# Patient Record
Sex: Female | Born: 1935 | Race: White | Hispanic: No | Smoking: Former smoker
Health system: Southern US, Community
[De-identification: ages and names within clinical notes are randomized; demographics above are authoritative.]

## PROBLEM LIST (undated history)

## (undated) DIAGNOSIS — K56609 Unspecified intestinal obstruction, unspecified as to partial versus complete obstruction: Secondary | ICD-10-CM

## (undated) DIAGNOSIS — I471 Supraventricular tachycardia, unspecified: Secondary | ICD-10-CM

## (undated) DIAGNOSIS — J189 Pneumonia, unspecified organism: Secondary | ICD-10-CM

## (undated) DIAGNOSIS — J40 Bronchitis, not specified as acute or chronic: Secondary | ICD-10-CM

## (undated) DIAGNOSIS — G43909 Migraine, unspecified, not intractable, without status migrainosus: Secondary | ICD-10-CM

## (undated) DIAGNOSIS — C50919 Malignant neoplasm of unspecified site of unspecified female breast: Secondary | ICD-10-CM

## (undated) DIAGNOSIS — I509 Heart failure, unspecified: Secondary | ICD-10-CM

## (undated) DIAGNOSIS — I2510 Atherosclerotic heart disease of native coronary artery without angina pectoris: Secondary | ICD-10-CM

## (undated) DIAGNOSIS — I480 Paroxysmal atrial fibrillation: Secondary | ICD-10-CM

## (undated) DIAGNOSIS — I428 Other cardiomyopathies: Secondary | ICD-10-CM

## (undated) DIAGNOSIS — I5032 Chronic diastolic (congestive) heart failure: Secondary | ICD-10-CM

## (undated) DIAGNOSIS — C9110 Chronic lymphocytic leukemia of B-cell type not having achieved remission: Principal | ICD-10-CM

## (undated) HISTORY — DX: Other cardiomyopathies: I42.8

## (undated) HISTORY — PX: ABDOMINAL HYSTERECTOMY: SHX81

## (undated) HISTORY — PX: APPENDECTOMY: SHX54

## (undated) HISTORY — DX: Paroxysmal atrial fibrillation: I48.0

## (undated) HISTORY — DX: Atherosclerotic heart disease of native coronary artery without angina pectoris: I25.10

## (undated) HISTORY — DX: Bronchitis, not specified as acute or chronic: J40

## (undated) HISTORY — DX: Supraventricular tachycardia, unspecified: I47.10

## (undated) HISTORY — PX: MASTECTOMY: SHX3

## (undated) HISTORY — PX: TOTAL HIP ARTHROPLASTY: SHX124

## (undated) HISTORY — DX: Chronic lymphocytic leukemia of B-cell type not having achieved remission: C91.10

## (undated) HISTORY — PX: CHOLECYSTECTOMY: SHX55

## (undated) HISTORY — DX: Malignant neoplasm of unspecified site of unspecified female breast: C50.919

## (undated) HISTORY — DX: Chronic diastolic (congestive) heart failure: I50.32

## (undated) NOTE — *Deleted (*Deleted)
Primary Care Physician: Gareth Morgan, MD Primary Cardiologist: Dr Tenny Craw Primary Electrophysiologist: none Referring Physician: Wynelle Cleveland is a 89 y.o. female with a history of mild AS, OSA, systolic CHF (LVEF 40 to 45% in 03/2019), LBBB, CLL, COPD, and  who presents for consultation in the [facility].  Patient is on Eliquis for a CHADS2VASC score of 4. Patient had recently been having palpitations and presyncopal episodes. A 14 day Zio monitor showed 3% afib burden and also 43 episodes of SVT. She was started on diltiazem. Since then, she has not had further presyncopal episodes. However, she did have an episode of chest pain while laying on her left side as well as palpitations occurring around 3 am on 03/22/20. This resolved within a few minutes. She denies any bleeding issues on anticoagulation.   On follow up today, ***  Today, she denies symptoms of ***palpitations, chest pain, shortness of breath, orthopnea, PND, lower extremity edema, dizziness, presyncope, syncope, bleeding, or neurologic sequela. The patient is tolerating medications without difficulties and is otherwise without complaint today.    Atrial Fibrillation Risk Factors:  she does have symptoms or diagnosis of sleep apnea. she is not compliant with CPAP therapy. she does not have a history of rheumatic fever. she does not have a history of alcohol use. The patient does have a history of early familial atrial fibrillation or other arrhythmias. Brother has PPM.  she has a BMI of There is no height or weight on file to calculate BMI.. There were no vitals filed for this visit.  Family History  Problem Relation Age of Onset  . Cancer Brother   . Cancer Other      Atrial Fibrillation Management history:  Previous antiarrhythmic drugs: none Previous cardioversions: none Previous ablations: none CHADS2VASC score: 4 Anticoagulation history: Eliquis    Past  Medical History:  Diagnosis Date  . Bowel obstruction (HCC)   . Breast cancer (HCC)   . Breast cancer, stage 3 (HCC) 07/21/2011  . Bronchitis   . CLL (chronic lymphocytic leukemia) (HCC) 07/21/2011  . Migraines   . PAF (paroxysmal atrial fibrillation) (HCC)   . Pneumonia    Past Surgical History:  Procedure Laterality Date  . ABDOMINAL HYSTERECTOMY    . APPENDECTOMY    . CHOLECYSTECTOMY    . LAPAROTOMY N/A 06/08/2015   Procedure: EXPLORATORY LAPAROTOMY;  Surgeon: Franky Macho, MD;  Location: [facility];  Service: General;  Laterality: N/A;  . LYSIS OF ADHESION N/A 06/08/2015   Procedure: LYSIS OF ADHESION;  Surgeon: Franky Macho, MD;  Location: [facility];  Service: General;  Laterality: N/A;  . MASTECTOMY     right  . TOTAL HIP ARTHROPLASTY     right    Current Outpatient Medications  Medication Sig Dispense Refill  . acetaminophen (TYLENOL) 500 MG tablet Take 500 mg by mouth every 6 (six) hours as needed for mild pain, moderate pain or headache.    . albuterol (PROVENTIL HFA;VENTOLIN HFA) 108 (90 BASE) MCG/ACT inhaler Inhale 2 puffs into the lungs every 6 (six) hours as needed for shortness of breath.     Marland Kitchen albuterol (PROVENTIL) (2.5 MG/3ML) 0.083% nebulizer solution Take 3 mLs (2.5 mg total) by nebulization every 6 (six) hours as needed for wheezing or shortness of breath (when not relieved by inhaler.). 75 mL 12  . apixaban (ELIQUIS) 5 MG TABS tablet Take 1 tablet (5 mg total) by mouth 2 (two) times daily. 180 tablet  1  . bisoprolol (ZEBETA) 5 MG tablet Take 7.5mg  by mouth once daily    . Blood Glucose Monitoring Suppl (ONETOUCH VERIO FLEX SYSTEM) w/Device KIT     . cholecalciferol (VITAMIN D) 1000 UNITS tablet Take 1,000 Units by mouth daily.    Marland Kitchen diltiazem (CARDIZEM) 30 MG tablet Take 1 tablet (30 mg total) by mouth 2 (two) times daily. 60 tablet 2  . Lancets (ONETOUCH DELICA PLUS LANCET33G) MISC 2 (two) times daily.    Marland Kitchen omeprazole (PRILOSEC) 20 MG capsule Take 20 mg by mouth at  bedtime.     Letta Pate VERIO test strip 2 (two) times daily.    Marland Kitchen spironolactone (ALDACTONE) 25 MG tablet Take 0.5 tablets (12.5 mg total) by mouth daily. 45 tablet 1   No current facility-administered medications for this visit.    Allergies  Allergen Reactions  . Codeine Nausea And Vomiting    headache  . Meclizine Other (See Comments)    Made dizziness worse.  . Sulfa Antibiotics Nausea And Vomiting    Stomach upset    Social History   Socioeconomic History  . Marital status: Widowed    Spouse name: Not on file  . Number of children: Not on file  . Years of education: 55  . Highest education level: Not on file  Occupational History  . Not on file  Tobacco Use  . Smoking status: Former Smoker    Quit date: 10/26/1978    Years since quitting: 41.4  . Smokeless tobacco: Never Used  Vaping Use  . Vaping Use: Never used  Substance and Sexual Activity  . Alcohol use: No  . Drug use: No  . Sexual activity: Not Currently  Other Topics Concern  . Not on file  Social History Narrative   Pt lives by herself   Social Determinants of Health   Financial Resource Strain:   . Difficulty of Paying Living Expenses: Not on file  Food Insecurity:   . Worried About Programme researcher, broadcasting/film/video in the Last Year: Not on file  . Ran Out of Food in the Last Year: Not on file  Transportation Needs:   . Lack of Transportation (Medical): Not on file  . Lack of Transportation (Non-Medical): Not on file  Physical Activity:   . Days of Exercise per Week: Not on file  . Minutes of Exercise per Session: Not on file  Stress:   . Feeling of Stress : Not on file  Social Connections:   . Frequency of Communication with Friends and Family: Not on file  . Frequency of Social Gatherings with Friends and Family: Not on file  . Attends Religious Services: Not on file  . Active Member of Clubs or Organizations: Not on file  . Attends Banker Meetings: Not on file  . Marital Status: Not on  file  Intimate Partner Violence:   . Fear of Current or Ex-Partner: Not on file  . Emotionally Abused: Not on file  . Physically Abused: Not on file  . Sexually Abused: Not on file     ROS- All systems are reviewed and negative except as per the HPI above.  Physical Exam: There were no vitals filed for this visit.  GEN- The patient is well appearing, alert and oriented x 3 today.   HEENT-head normocephalic, atraumatic, sclera clear, conjunctiva pink, hearing intact, trachea midline. Lungs- Clear to ausculation bilaterally, normal work of breathing Heart- ***Regular rate and rhythm, no murmurs, rubs or gallops  GI-  soft, NT, ND, + BS Extremities- no clubbing, cyanosis, or edema MS- no significant deformity or atrophy Skin- no rash or lesion Psych- euthymic mood, full affect Neuro- strength and sensation are intact   Wt Readings from Last 3 Encounters:  03/23/20 81.6 kg  03/19/20 81 kg  02/11/20 81.6 kg    EKG today demonstrates ***  Echo 03/26/19 demonstrated  1. Left ventricular ejection fraction, by visual estimation, is 40 to  45%. The left ventricle has mild to moderately decreased function. There  is moderately increased left ventricular hypertrophy.  2. Abnormal septal motion consistent with left bundle branch block.  3. Left ventricular diastolic parameters are consistent with Grade II  diastolic dysfunction (pseudonormalization).  4. Global right ventricle has normal systolic function.The right  ventricular size is normal. No increase in right ventricular wall  thickness.  5. Left atrial size was severely dilated.  6. Right atrial size was normal.  7. Presence of pericardial fat pad.  8. Mild to moderate aortic valve annular calcification.  9. Moderate mitral annular calcification.  10. The mitral valve is grossly normal. Mild mitral valve regurgitation.  11. The tricuspid valve is grossly normal. Tricuspid valve regurgitation  is mild.  12. Aortic  valve mean gradient measures 11.0 mmHg.  13. The aortic valve is tricuspid. Aortic valve regurgitation is not  visualized. Mild aortic valve stenosis.  14. The pulmonic valve was grossly normal. Pulmonic valve regurgitation is  trivial.  15. Mildly elevated pulmonary artery systolic pressure.  16. The tricuspid regurgitant velocity is 3.04 m/s, and with an assumed  right atrial pressure of 3 mmHg, the estimated right ventricular systolic  pressure is mildly elevated at 40.0 mmHg.  17. The inferior vena cava is normal in size with greater than 50%  respiratory variability, suggesting right atrial pressure of 3 mmHg.   In comparison to the previous echocardiogram(s): Compared to the prior  study in February 2020 LVEF has decreased.  Epic records are reviewed at length today  CHA2DS2-VASc Score = 4  The patient's score is based upon: CHF History: 1 HTN History: 0 Diabetes History: 0 Stroke History: 0 Vascular Disease History: 0 Age Score: 2 Gender Score: 1      ASSESSMENT AND PLAN: 1. Paroxysmal Atrial Fibrillation/SVT The patient's CHA2DS2-VASc score is 4, indicating a 4.8% annual risk of stroke.   *** Continue bisoprolol 7.5 mg daily Continue Eliquis 5 mg BID Continue diltiazem 30 mg BID  2. Secondary Hypercoagulable State (ICD10:  D68.69) The patient is at significant risk for stroke/thromboembolism based upon her CHA2DS2-VASc Score of 4.  Continue Apixaban (Eliquis).   3. Obesity There is no height or weight on file to calculate BMI. Lifestyle modification was discussed and encouraged including regular physical activity and weight reduction. ***  4. Obstructive sleep apnea Patient is not compliant with CPAP therapy.  ***  5. Chronic systolic CHF No signs or symptoms of fluid overload.  ***   Follow up ***   Jorja Loa PA-C [facility] [address] [place], [place] [address] [phone] 04/07/2020 8:49 AM

---

## 1997-09-25 ENCOUNTER — Other Ambulatory Visit: Admission: RE | Admit: 1997-09-25 | Discharge: 1997-09-25 | Payer: Self-pay | Admitting: *Deleted

## 1997-09-30 ENCOUNTER — Ambulatory Visit (HOSPITAL_COMMUNITY): Admission: RE | Admit: 1997-09-30 | Discharge: 1997-09-30 | Payer: Self-pay | Admitting: *Deleted

## 1997-10-10 ENCOUNTER — Ambulatory Visit (HOSPITAL_COMMUNITY): Admission: RE | Admit: 1997-10-10 | Discharge: 1997-10-10 | Payer: Self-pay | Admitting: Oncology

## 1997-10-27 ENCOUNTER — Ambulatory Visit (HOSPITAL_COMMUNITY): Admission: RE | Admit: 1997-10-27 | Discharge: 1997-10-27 | Payer: Self-pay | Admitting: Oncology

## 1997-12-23 ENCOUNTER — Ambulatory Visit (HOSPITAL_COMMUNITY): Admission: RE | Admit: 1997-12-23 | Discharge: 1997-12-24 | Payer: Self-pay | Admitting: *Deleted

## 1998-03-03 ENCOUNTER — Encounter: Admission: RE | Admit: 1998-03-03 | Discharge: 1998-06-01 | Payer: Self-pay | Admitting: Radiation Oncology

## 2002-07-19 ENCOUNTER — Encounter: Payer: Self-pay | Admitting: Gastroenterology

## 2002-07-19 ENCOUNTER — Ambulatory Visit (HOSPITAL_COMMUNITY): Admission: RE | Admit: 2002-07-19 | Discharge: 2002-07-19 | Payer: Self-pay | Admitting: Gastroenterology

## 2002-07-22 ENCOUNTER — Emergency Department (HOSPITAL_COMMUNITY): Admission: EM | Admit: 2002-07-22 | Discharge: 2002-07-22 | Payer: Self-pay | Admitting: Emergency Medicine

## 2002-07-31 ENCOUNTER — Ambulatory Visit (HOSPITAL_COMMUNITY): Admission: RE | Admit: 2002-07-31 | Discharge: 2002-07-31 | Payer: Self-pay | Admitting: Gastroenterology

## 2002-07-31 ENCOUNTER — Encounter (INDEPENDENT_AMBULATORY_CARE_PROVIDER_SITE_OTHER): Payer: Self-pay | Admitting: Specialist

## 2002-12-17 ENCOUNTER — Other Ambulatory Visit: Admission: RE | Admit: 2002-12-17 | Discharge: 2002-12-17 | Payer: Self-pay | Admitting: Anesthesiology

## 2004-03-23 ENCOUNTER — Ambulatory Visit: Payer: Self-pay | Admitting: Oncology

## 2004-07-20 ENCOUNTER — Ambulatory Visit: Payer: Self-pay | Admitting: Oncology

## 2004-08-03 ENCOUNTER — Ambulatory Visit (HOSPITAL_COMMUNITY): Admission: RE | Admit: 2004-08-03 | Discharge: 2004-08-03 | Payer: Self-pay

## 2004-08-03 ENCOUNTER — Ambulatory Visit (HOSPITAL_COMMUNITY): Admission: RE | Admit: 2004-08-03 | Discharge: 2004-08-03 | Payer: Self-pay | Admitting: Oncology

## 2004-09-07 ENCOUNTER — Ambulatory Visit: Payer: Self-pay | Admitting: Oncology

## 2004-11-04 ENCOUNTER — Ambulatory Visit: Payer: Self-pay | Admitting: Oncology

## 2004-11-15 ENCOUNTER — Encounter (INDEPENDENT_AMBULATORY_CARE_PROVIDER_SITE_OTHER): Payer: Self-pay | Admitting: Internal Medicine

## 2004-12-30 ENCOUNTER — Ambulatory Visit: Payer: Self-pay | Admitting: Oncology

## 2005-02-17 ENCOUNTER — Ambulatory Visit: Payer: Self-pay | Admitting: Oncology

## 2005-05-27 ENCOUNTER — Ambulatory Visit: Payer: Self-pay | Admitting: Oncology

## 2005-08-30 ENCOUNTER — Ambulatory Visit: Payer: Self-pay | Admitting: Oncology

## 2005-08-31 LAB — CBC WITH DIFFERENTIAL/PLATELET
BASO%: 0.2 % (ref 0.0–2.0)
Eosinophils Absolute: 0.1 10*3/uL (ref 0.0–0.5)
HCT: 44.2 % (ref 34.8–46.6)
LYMPH%: 76.3 % — ABNORMAL HIGH (ref 14.0–48.0)
MONO#: 1.5 10*3/uL — ABNORMAL HIGH (ref 0.1–0.9)
NEUT#: 9.7 10*3/uL — ABNORMAL HIGH (ref 1.5–6.5)
Platelets: 293 10*3/uL (ref 145–400)
RBC: 4.91 10*6/uL (ref 3.70–5.32)
WBC: 47.7 10*3/uL — ABNORMAL HIGH (ref 3.9–10.0)
lymph#: 36.4 10*3/uL — ABNORMAL HIGH (ref 0.9–3.3)

## 2005-08-31 LAB — MORPHOLOGY
PLT EST: ADEQUATE
RBC Comments: NORMAL

## 2005-08-31 LAB — COMPREHENSIVE METABOLIC PANEL
ALT: 13 U/L (ref 0–40)
AST: 17 U/L (ref 0–37)
Alkaline Phosphatase: 53 U/L (ref 39–117)
CO2: 22 mEq/L (ref 19–32)
Creatinine, Ser: 1.1 mg/dL (ref 0.4–1.2)
Sodium: 139 mEq/L (ref 135–145)
Total Bilirubin: 0.6 mg/dL (ref 0.3–1.2)
Total Protein: 7 g/dL (ref 6.0–8.3)

## 2005-11-14 ENCOUNTER — Ambulatory Visit: Payer: Self-pay | Admitting: Oncology

## 2005-11-14 LAB — COMPREHENSIVE METABOLIC PANEL
ALT: 16 U/L (ref 0–40)
BUN: 17 mg/dL (ref 6–23)
CO2: 17 mEq/L — ABNORMAL LOW (ref 19–32)
Calcium: 9.2 mg/dL (ref 8.4–10.5)
Creatinine, Ser: 0.97 mg/dL (ref 0.40–1.20)
Total Bilirubin: 0.5 mg/dL (ref 0.3–1.2)

## 2005-11-14 LAB — MORPHOLOGY
PLT EST: ADEQUATE
RBC Comments: NORMAL

## 2005-11-14 LAB — CBC WITH DIFFERENTIAL/PLATELET
BASO%: 0.4 % (ref 0.0–2.0)
Eosinophils Absolute: 0.2 10*3/uL (ref 0.0–0.5)
MCHC: 34 g/dL (ref 32.0–36.0)
MONO#: 1.1 10*3/uL — ABNORMAL HIGH (ref 0.1–0.9)
NEUT#: 5.1 10*3/uL (ref 1.5–6.5)
RBC: 4.89 10*6/uL (ref 3.70–5.32)
RDW: 14 % (ref 11.3–14.5)
WBC: 32 10*3/uL — ABNORMAL HIGH (ref 3.9–10.0)

## 2005-11-14 LAB — LACTATE DEHYDROGENASE: LDH: 163 U/L (ref 94–250)

## 2006-01-04 ENCOUNTER — Ambulatory Visit: Payer: Self-pay | Admitting: Oncology

## 2006-01-09 LAB — MORPHOLOGY: PLT EST: ADEQUATE

## 2006-01-09 LAB — CBC WITH DIFFERENTIAL/PLATELET
Basophils Absolute: 0.1 10*3/uL (ref 0.0–0.1)
Eosinophils Absolute: 0.3 10*3/uL (ref 0.0–0.5)
HGB: 13.8 g/dL (ref 11.6–15.9)
MCV: 89.1 fL (ref 81.0–101.0)
MONO#: 1 10*3/uL — ABNORMAL HIGH (ref 0.1–0.9)
NEUT#: 4.1 10*3/uL (ref 1.5–6.5)
RDW: 13.9 % (ref 11.3–14.5)
WBC: 29.9 10*3/uL — ABNORMAL HIGH (ref 3.9–10.0)
lymph#: 24.5 10*3/uL — ABNORMAL HIGH (ref 0.9–3.3)

## 2006-03-02 ENCOUNTER — Ambulatory Visit: Payer: Self-pay | Admitting: Oncology

## 2006-03-06 LAB — MORPHOLOGY: PLT EST: ADEQUATE

## 2006-03-06 LAB — CBC WITH DIFFERENTIAL/PLATELET
BASO%: 0.3 % (ref 0.0–2.0)
EOS%: 1.3 % (ref 0.0–7.0)
MCH: 29.4 pg (ref 26.0–34.0)
MCHC: 33.6 g/dL (ref 32.0–36.0)
RDW: 13.9 % (ref 11.3–14.5)
lymph#: 25.7 10*3/uL — ABNORMAL HIGH (ref 0.9–3.3)

## 2006-03-06 LAB — COMPREHENSIVE METABOLIC PANEL
AST: 16 U/L (ref 0–37)
Alkaline Phosphatase: 65 U/L (ref 39–117)
BUN: 17 mg/dL (ref 6–23)
Calcium: 8.9 mg/dL (ref 8.4–10.5)
Chloride: 107 mEq/L (ref 96–112)
Creatinine, Ser: 0.79 mg/dL (ref 0.40–1.20)
Glucose, Bld: 94 mg/dL (ref 70–99)

## 2006-03-13 ENCOUNTER — Ambulatory Visit (HOSPITAL_COMMUNITY): Admission: RE | Admit: 2006-03-13 | Discharge: 2006-03-13 | Payer: Self-pay | Admitting: Oncology

## 2006-03-14 ENCOUNTER — Encounter (INDEPENDENT_AMBULATORY_CARE_PROVIDER_SITE_OTHER): Payer: Self-pay | Admitting: Internal Medicine

## 2006-05-31 ENCOUNTER — Ambulatory Visit: Payer: Self-pay | Admitting: Oncology

## 2006-06-05 LAB — CBC WITH DIFFERENTIAL/PLATELET
BASO%: 1.8 % (ref 0.0–2.0)
Basophils Absolute: 0.8 10*3/uL — ABNORMAL HIGH (ref 0.0–0.1)
EOS%: 0.5 % (ref 0.0–7.0)
HCT: 38.9 % (ref 34.8–46.6)
HGB: 12.9 g/dL (ref 11.6–15.9)
LYMPH%: 84.8 % — ABNORMAL HIGH (ref 14.0–48.0)
MCH: 27.2 pg (ref 26.0–34.0)
MCHC: 33.1 g/dL (ref 32.0–36.0)
MCV: 82.2 fL (ref 81.0–101.0)
NEUT%: 11.1 % — ABNORMAL LOW (ref 39.6–76.8)
Platelets: 331 10*3/uL (ref 145–400)
lymph#: 35.4 10*3/uL — ABNORMAL HIGH (ref 0.9–3.3)

## 2006-06-05 LAB — MORPHOLOGY
PLT EST: ADEQUATE
RBC Comments: NORMAL

## 2006-08-31 ENCOUNTER — Ambulatory Visit: Payer: Self-pay | Admitting: Oncology

## 2006-09-11 LAB — CBC WITH DIFFERENTIAL/PLATELET
BASO%: 0.4 % (ref 0.0–2.0)
Eosinophils Absolute: 0.1 10*3/uL (ref 0.0–0.5)
HCT: 40 % (ref 34.8–46.6)
MCHC: 34.1 g/dL (ref 32.0–36.0)
MONO#: 2.1 10*3/uL — ABNORMAL HIGH (ref 0.1–0.9)
NEUT#: 7.6 10*3/uL — ABNORMAL HIGH (ref 1.5–6.5)
Platelets: 300 10*3/uL (ref 145–400)
RBC: 4.79 10*6/uL (ref 3.70–5.32)
WBC: 48.8 10*3/uL — ABNORMAL HIGH (ref 3.9–10.0)
lymph#: 38.7 10*3/uL — ABNORMAL HIGH (ref 0.9–3.3)

## 2006-09-11 LAB — MORPHOLOGY: PLT EST: ADEQUATE

## 2006-09-11 LAB — COMPREHENSIVE METABOLIC PANEL
ALT: 13 U/L (ref 0–35)
AST: 15 U/L (ref 0–37)
Alkaline Phosphatase: 76 U/L (ref 39–117)
Creatinine, Ser: 0.92 mg/dL (ref 0.40–1.20)
Sodium: 142 mEq/L (ref 135–145)
Total Bilirubin: 0.4 mg/dL (ref 0.3–1.2)
Total Protein: 6.8 g/dL (ref 6.0–8.3)

## 2006-09-20 ENCOUNTER — Ambulatory Visit (HOSPITAL_COMMUNITY): Admission: RE | Admit: 2006-09-20 | Discharge: 2006-09-20 | Payer: Self-pay | Admitting: Oncology

## 2006-10-29 ENCOUNTER — Ambulatory Visit: Payer: Self-pay | Admitting: Oncology

## 2006-11-01 LAB — CBC WITH DIFFERENTIAL/PLATELET
BASO%: 0.5 % (ref 0.0–2.0)
Basophils Absolute: 0.2 10*3/uL — ABNORMAL HIGH (ref 0.0–0.1)
EOS%: 0.6 % (ref 0.0–7.0)
HGB: 13.7 g/dL (ref 11.6–15.9)
MCH: 28.1 pg (ref 26.0–34.0)
MCHC: 33.4 g/dL (ref 32.0–36.0)
MCV: 84.1 fL (ref 81.0–101.0)
MONO%: 3.7 % (ref 0.0–13.0)
NEUT%: 13.3 % — ABNORMAL LOW (ref 39.6–76.8)
RDW: 15.2 % — ABNORMAL HIGH (ref 11.3–14.5)
lymph#: 42.7 10*3/uL — ABNORMAL HIGH (ref 0.9–3.3)

## 2006-11-01 LAB — COMPREHENSIVE METABOLIC PANEL
BUN: 21 mg/dL (ref 6–23)
CO2: 18 mEq/L — ABNORMAL LOW (ref 19–32)
Calcium: 9.8 mg/dL (ref 8.4–10.5)
Chloride: 109 mEq/L (ref 96–112)
Creatinine, Ser: 0.97 mg/dL (ref 0.40–1.20)
Total Bilirubin: 0.5 mg/dL (ref 0.3–1.2)

## 2006-11-01 LAB — MORPHOLOGY: PLT EST: ADEQUATE

## 2006-11-01 LAB — LACTATE DEHYDROGENASE: LDH: 171 U/L (ref 94–250)

## 2006-12-22 ENCOUNTER — Ambulatory Visit: Payer: Self-pay | Admitting: Oncology

## 2006-12-27 LAB — CBC WITH DIFFERENTIAL/PLATELET
Basophils Absolute: 0.2 10*3/uL — ABNORMAL HIGH (ref 0.0–0.1)
Eosinophils Absolute: 0.4 10*3/uL (ref 0.0–0.5)
HGB: 13.3 g/dL (ref 11.6–15.9)
MONO#: 1.6 10*3/uL — ABNORMAL HIGH (ref 0.1–0.9)
NEUT#: 7.1 10*3/uL — ABNORMAL HIGH (ref 1.5–6.5)
RBC: 4.72 10*6/uL (ref 3.70–5.32)
RDW: 15.3 % — ABNORMAL HIGH (ref 11.3–14.5)
WBC: 47.9 10*3/uL — ABNORMAL HIGH (ref 3.9–10.0)
lymph#: 38.6 10*3/uL — ABNORMAL HIGH (ref 0.9–3.3)

## 2006-12-27 LAB — MORPHOLOGY

## 2007-01-08 ENCOUNTER — Ambulatory Visit: Payer: Self-pay | Admitting: Internal Medicine

## 2007-01-08 ENCOUNTER — Encounter: Payer: Self-pay | Admitting: Orthopedic Surgery

## 2007-01-08 ENCOUNTER — Ambulatory Visit (HOSPITAL_COMMUNITY): Admission: RE | Admit: 2007-01-08 | Discharge: 2007-01-08 | Payer: Self-pay | Admitting: Internal Medicine

## 2007-01-08 DIAGNOSIS — F411 Generalized anxiety disorder: Secondary | ICD-10-CM | POA: Insufficient documentation

## 2007-01-08 DIAGNOSIS — Z87898 Personal history of other specified conditions: Secondary | ICD-10-CM

## 2007-01-08 DIAGNOSIS — F329 Major depressive disorder, single episode, unspecified: Secondary | ICD-10-CM

## 2007-01-08 DIAGNOSIS — M545 Low back pain: Secondary | ICD-10-CM

## 2007-01-08 DIAGNOSIS — J309 Allergic rhinitis, unspecified: Secondary | ICD-10-CM | POA: Insufficient documentation

## 2007-01-08 DIAGNOSIS — Z856 Personal history of leukemia: Secondary | ICD-10-CM | POA: Insufficient documentation

## 2007-01-08 DIAGNOSIS — M899 Disorder of bone, unspecified: Secondary | ICD-10-CM | POA: Insufficient documentation

## 2007-01-08 DIAGNOSIS — Z853 Personal history of malignant neoplasm of breast: Secondary | ICD-10-CM

## 2007-01-08 DIAGNOSIS — M25559 Pain in unspecified hip: Secondary | ICD-10-CM | POA: Insufficient documentation

## 2007-01-08 DIAGNOSIS — M129 Arthropathy, unspecified: Secondary | ICD-10-CM | POA: Insufficient documentation

## 2007-01-08 DIAGNOSIS — M949 Disorder of cartilage, unspecified: Secondary | ICD-10-CM

## 2007-01-15 ENCOUNTER — Encounter (INDEPENDENT_AMBULATORY_CARE_PROVIDER_SITE_OTHER): Payer: Self-pay | Admitting: Internal Medicine

## 2007-01-16 ENCOUNTER — Telehealth (INDEPENDENT_AMBULATORY_CARE_PROVIDER_SITE_OTHER): Payer: Self-pay | Admitting: *Deleted

## 2007-01-16 LAB — CONVERTED CEMR LAB
Chloride: 112 meq/L (ref 96–112)
Glucose, Bld: 95 mg/dL (ref 70–99)
Potassium: 4.3 meq/L (ref 3.5–5.3)
Sodium: 144 meq/L (ref 135–145)
Triglycerides: 139 mg/dL (ref <150)
VLDL: 28 mg/dL (ref 0–40)

## 2007-03-02 ENCOUNTER — Ambulatory Visit (HOSPITAL_COMMUNITY): Admission: RE | Admit: 2007-03-02 | Discharge: 2007-03-02 | Payer: Self-pay | Admitting: Internal Medicine

## 2007-03-02 ENCOUNTER — Ambulatory Visit: Payer: Self-pay | Admitting: Internal Medicine

## 2007-03-05 ENCOUNTER — Encounter (INDEPENDENT_AMBULATORY_CARE_PROVIDER_SITE_OTHER): Payer: Self-pay | Admitting: Internal Medicine

## 2007-03-05 ENCOUNTER — Telehealth (INDEPENDENT_AMBULATORY_CARE_PROVIDER_SITE_OTHER): Payer: Self-pay | Admitting: *Deleted

## 2007-03-05 ENCOUNTER — Ambulatory Visit: Payer: Self-pay | Admitting: Oncology

## 2007-03-07 ENCOUNTER — Encounter (INDEPENDENT_AMBULATORY_CARE_PROVIDER_SITE_OTHER): Payer: Self-pay | Admitting: Internal Medicine

## 2007-03-07 LAB — CBC WITH DIFFERENTIAL/PLATELET
Eosinophils Absolute: 0.2 10*3/uL (ref 0.0–0.5)
HCT: 40.4 % (ref 34.8–46.6)
LYMPH%: 83.9 % — ABNORMAL HIGH (ref 14.0–48.0)
MCV: 82.6 fL (ref 81.0–101.0)
MONO#: 0.7 10*3/uL (ref 0.1–0.9)
MONO%: 1.3 % (ref 0.0–13.0)
NEUT#: 8 10*3/uL — ABNORMAL HIGH (ref 1.5–6.5)
NEUT%: 14.1 % — ABNORMAL LOW (ref 39.6–76.8)
Platelets: 281 10*3/uL (ref 145–400)
WBC: 56.9 10*3/uL (ref 3.9–10.0)

## 2007-03-07 LAB — COMPREHENSIVE METABOLIC PANEL
ALT: 16 U/L (ref 0–35)
Albumin: 4.6 g/dL (ref 3.5–5.2)
CO2: 15 mEq/L — ABNORMAL LOW (ref 19–32)
Calcium: 9.4 mg/dL (ref 8.4–10.5)
Chloride: 108 mEq/L (ref 96–112)
Glucose, Bld: 83 mg/dL (ref 70–99)
Potassium: 4.3 mEq/L (ref 3.5–5.3)
Sodium: 140 mEq/L (ref 135–145)
Total Protein: 7 g/dL (ref 6.0–8.3)

## 2007-03-07 LAB — LACTATE DEHYDROGENASE: LDH: 184 U/L (ref 94–250)

## 2007-03-07 LAB — MORPHOLOGY: PLT EST: ADEQUATE

## 2007-03-08 ENCOUNTER — Telehealth (INDEPENDENT_AMBULATORY_CARE_PROVIDER_SITE_OTHER): Payer: Self-pay | Admitting: *Deleted

## 2007-03-19 ENCOUNTER — Encounter (INDEPENDENT_AMBULATORY_CARE_PROVIDER_SITE_OTHER): Payer: Self-pay | Admitting: Internal Medicine

## 2007-03-21 ENCOUNTER — Ambulatory Visit: Payer: Self-pay | Admitting: Internal Medicine

## 2007-04-17 ENCOUNTER — Encounter (INDEPENDENT_AMBULATORY_CARE_PROVIDER_SITE_OTHER): Payer: Self-pay | Admitting: Internal Medicine

## 2007-04-17 ENCOUNTER — Telehealth (INDEPENDENT_AMBULATORY_CARE_PROVIDER_SITE_OTHER): Payer: Self-pay | Admitting: *Deleted

## 2007-04-18 ENCOUNTER — Ambulatory Visit (HOSPITAL_COMMUNITY): Admission: RE | Admit: 2007-04-18 | Discharge: 2007-04-18 | Payer: Self-pay | Admitting: Internal Medicine

## 2007-04-18 ENCOUNTER — Encounter: Payer: Self-pay | Admitting: Orthopedic Surgery

## 2007-04-23 ENCOUNTER — Encounter (INDEPENDENT_AMBULATORY_CARE_PROVIDER_SITE_OTHER): Payer: Self-pay | Admitting: Internal Medicine

## 2007-04-23 ENCOUNTER — Telehealth (INDEPENDENT_AMBULATORY_CARE_PROVIDER_SITE_OTHER): Payer: Self-pay | Admitting: *Deleted

## 2007-04-30 ENCOUNTER — Encounter (INDEPENDENT_AMBULATORY_CARE_PROVIDER_SITE_OTHER): Payer: Self-pay | Admitting: Internal Medicine

## 2007-05-01 ENCOUNTER — Encounter (INDEPENDENT_AMBULATORY_CARE_PROVIDER_SITE_OTHER): Payer: Self-pay | Admitting: Internal Medicine

## 2007-05-09 ENCOUNTER — Encounter (INDEPENDENT_AMBULATORY_CARE_PROVIDER_SITE_OTHER): Payer: Self-pay | Admitting: Internal Medicine

## 2007-05-30 ENCOUNTER — Telehealth (INDEPENDENT_AMBULATORY_CARE_PROVIDER_SITE_OTHER): Payer: Self-pay | Admitting: *Deleted

## 2007-05-30 ENCOUNTER — Ambulatory Visit: Payer: Self-pay | Admitting: Orthopedic Surgery

## 2007-06-05 ENCOUNTER — Encounter: Payer: Self-pay | Admitting: Orthopedic Surgery

## 2007-06-15 ENCOUNTER — Encounter: Payer: Self-pay | Admitting: Orthopedic Surgery

## 2007-06-27 ENCOUNTER — Ambulatory Visit: Payer: Self-pay | Admitting: Oncology

## 2007-07-02 ENCOUNTER — Encounter (INDEPENDENT_AMBULATORY_CARE_PROVIDER_SITE_OTHER): Payer: Self-pay | Admitting: Internal Medicine

## 2007-07-02 LAB — COMPREHENSIVE METABOLIC PANEL
ALT: 14 U/L (ref 0–35)
Albumin: 4.6 g/dL (ref 3.5–5.2)
CO2: 18 mEq/L — ABNORMAL LOW (ref 19–32)
Calcium: 9.7 mg/dL (ref 8.4–10.5)
Chloride: 109 mEq/L (ref 96–112)
Creatinine, Ser: 0.96 mg/dL (ref 0.40–1.20)
Sodium: 142 mEq/L (ref 135–145)
Total Protein: 7 g/dL (ref 6.0–8.3)

## 2007-07-02 LAB — MANUAL DIFFERENTIAL
ALC: 54.6 10*3/uL — ABNORMAL HIGH (ref 0.9–3.3)
Band Neutrophils: 0 % (ref 0–10)
Blasts: 0 % (ref 0–0)
Myelocytes: 0 % (ref 0–0)
Other Cell: 0 % (ref 0–0)
PROMYELO: 0 % (ref 0–0)
SEG: 10 % — ABNORMAL LOW (ref 38–77)
Variant Lymph: 0 % (ref 0–0)
nRBC: 0 % (ref 0–0)

## 2007-07-02 LAB — CBC WITH DIFFERENTIAL/PLATELET
HGB: 13.9 g/dL (ref 11.6–15.9)
Platelets: 302 10*3/uL (ref 145–400)
RBC: 5.04 10*6/uL (ref 3.70–5.32)
RDW: 14.7 % — ABNORMAL HIGH (ref 11.3–14.5)
WBC: 62 10*3/uL (ref 3.9–10.0)

## 2007-07-02 LAB — LACTATE DEHYDROGENASE: LDH: 167 U/L (ref 94–250)

## 2007-07-04 ENCOUNTER — Encounter (INDEPENDENT_AMBULATORY_CARE_PROVIDER_SITE_OTHER): Payer: Self-pay | Admitting: Internal Medicine

## 2007-08-23 ENCOUNTER — Ambulatory Visit: Payer: Self-pay | Admitting: Oncology

## 2007-08-28 LAB — CBC WITH DIFFERENTIAL/PLATELET
BASO%: 0.3 % (ref 0.0–2.0)
Eosinophils Absolute: 0.2 10*3/uL (ref 0.0–0.5)
HCT: 43.1 % (ref 34.8–46.6)
LYMPH%: 79.5 % — ABNORMAL HIGH (ref 14.0–48.0)
MCHC: 34.3 g/dL (ref 32.0–36.0)
MONO#: 0.9 10*3/uL (ref 0.1–0.9)
NEUT#: 12.9 10*3/uL — ABNORMAL HIGH (ref 1.5–6.5)
NEUT%: 18.6 % — ABNORMAL LOW (ref 39.6–76.8)
Platelets: 269 10*3/uL (ref 145–400)
WBC: 69.4 10*3/uL (ref 3.9–10.0)
lymph#: 55.2 10*3/uL — ABNORMAL HIGH (ref 0.9–3.3)

## 2007-08-28 LAB — CONVERTED CEMR LAB
Basophils Absolute: 0.2 10*3/uL
Basophils Relative: 0.3 %
HCT: 43.1 %
Hemoglobin: 14.8 g/dL
MCV: 84.1 fL
Monocytes Relative: 1.3 %
Platelets: 269 10*3/uL
RBC: 5.13 M/uL
RDW: 15.9 %
WBC: 69.4 10*3/uL

## 2007-08-28 LAB — MORPHOLOGY
PLT EST: ADEQUATE
RBC Comments: NORMAL

## 2007-09-14 ENCOUNTER — Ambulatory Visit: Payer: Self-pay | Admitting: Internal Medicine

## 2007-09-14 DIAGNOSIS — R5383 Other fatigue: Secondary | ICD-10-CM

## 2007-09-14 DIAGNOSIS — R5381 Other malaise: Secondary | ICD-10-CM

## 2007-09-16 LAB — CONVERTED CEMR LAB
BUN: 26 mg/dL — ABNORMAL HIGH (ref 6–23)
Chloride: 109 meq/L (ref 96–112)
Glucose, Bld: 102 mg/dL — ABNORMAL HIGH (ref 70–99)
Sodium: 140 meq/L (ref 135–145)
TSH: 2.865 microintl units/mL (ref 0.350–5.50)

## 2007-09-17 ENCOUNTER — Telehealth (INDEPENDENT_AMBULATORY_CARE_PROVIDER_SITE_OTHER): Payer: Self-pay | Admitting: *Deleted

## 2007-09-18 ENCOUNTER — Encounter (INDEPENDENT_AMBULATORY_CARE_PROVIDER_SITE_OTHER): Payer: Self-pay | Admitting: Internal Medicine

## 2007-09-21 ENCOUNTER — Encounter (INDEPENDENT_AMBULATORY_CARE_PROVIDER_SITE_OTHER): Payer: Self-pay | Admitting: Internal Medicine

## 2007-09-27 ENCOUNTER — Encounter (INDEPENDENT_AMBULATORY_CARE_PROVIDER_SITE_OTHER): Payer: Self-pay | Admitting: Internal Medicine

## 2007-09-28 ENCOUNTER — Telehealth (INDEPENDENT_AMBULATORY_CARE_PROVIDER_SITE_OTHER): Payer: Self-pay | Admitting: *Deleted

## 2007-10-22 ENCOUNTER — Encounter (INDEPENDENT_AMBULATORY_CARE_PROVIDER_SITE_OTHER): Payer: Self-pay | Admitting: Internal Medicine

## 2007-10-26 ENCOUNTER — Ambulatory Visit: Payer: Self-pay | Admitting: Oncology

## 2007-10-30 ENCOUNTER — Encounter (INDEPENDENT_AMBULATORY_CARE_PROVIDER_SITE_OTHER): Payer: Self-pay | Admitting: Internal Medicine

## 2007-10-30 ENCOUNTER — Encounter: Payer: Self-pay | Admitting: Orthopedic Surgery

## 2007-10-30 LAB — COMPREHENSIVE METABOLIC PANEL
Albumin: 4.4 g/dL (ref 3.5–5.2)
BUN: 20 mg/dL (ref 6–23)
CO2: 17 mEq/L — ABNORMAL LOW (ref 19–32)
Calcium: 10.1 mg/dL (ref 8.4–10.5)
Chloride: 108 mEq/L (ref 96–112)
Glucose, Bld: 91 mg/dL (ref 70–99)
Potassium: 4.1 mEq/L (ref 3.5–5.3)
Total Protein: 7.4 g/dL (ref 6.0–8.3)

## 2007-10-30 LAB — CBC WITH DIFFERENTIAL/PLATELET
Basophils Absolute: 0.6 10*3/uL — ABNORMAL HIGH (ref 0.0–0.1)
Eosinophils Absolute: 0.4 10*3/uL (ref 0.0–0.5)
HGB: 14.9 g/dL (ref 11.6–15.9)
MCV: 86.9 fL (ref 81.0–101.0)
MONO#: 2.7 10*3/uL — ABNORMAL HIGH (ref 0.1–0.9)
NEUT#: 12 10*3/uL — ABNORMAL HIGH (ref 1.5–6.5)
RDW: 15 % — ABNORMAL HIGH (ref 11.3–14.5)
WBC: 83 10*3/uL (ref 3.9–10.0)
lymph#: 67.3 10*3/uL — ABNORMAL HIGH (ref 0.9–3.3)

## 2007-10-30 LAB — LACTATE DEHYDROGENASE: LDH: 167 U/L (ref 94–250)

## 2007-10-31 ENCOUNTER — Encounter (INDEPENDENT_AMBULATORY_CARE_PROVIDER_SITE_OTHER): Payer: Self-pay | Admitting: Internal Medicine

## 2007-10-31 LAB — CONVERTED CEMR LAB
Eosinophils Absolute: 0.4 10*3/uL
Eosinophils Relative: 0.5 %
Hemoglobin: 14.9 g/dL
Lymphocytes Relative: 81.1 %
Lymphs Abs: 67.3 10*3/uL
MCHC: 32.7 g/dL
MCV: 86.9 fL
Monocytes Absolute: 2.7 10*3/uL
Neutro Abs: 12 10*3/uL
RDW: 15 %

## 2007-11-09 ENCOUNTER — Telehealth (INDEPENDENT_AMBULATORY_CARE_PROVIDER_SITE_OTHER): Payer: Self-pay | Admitting: *Deleted

## 2007-11-15 ENCOUNTER — Ambulatory Visit: Payer: Self-pay | Admitting: Internal Medicine

## 2007-11-16 ENCOUNTER — Telehealth (INDEPENDENT_AMBULATORY_CARE_PROVIDER_SITE_OTHER): Payer: Self-pay | Admitting: Internal Medicine

## 2007-11-27 ENCOUNTER — Telehealth (INDEPENDENT_AMBULATORY_CARE_PROVIDER_SITE_OTHER): Payer: Self-pay | Admitting: Internal Medicine

## 2007-11-27 ENCOUNTER — Encounter (INDEPENDENT_AMBULATORY_CARE_PROVIDER_SITE_OTHER): Payer: Self-pay | Admitting: Internal Medicine

## 2007-11-30 ENCOUNTER — Encounter (INDEPENDENT_AMBULATORY_CARE_PROVIDER_SITE_OTHER): Payer: Self-pay | Admitting: Internal Medicine

## 2007-11-30 ENCOUNTER — Ambulatory Visit (HOSPITAL_COMMUNITY): Admission: RE | Admit: 2007-11-30 | Discharge: 2007-11-30 | Payer: Self-pay | Admitting: Cardiology

## 2007-12-06 ENCOUNTER — Ambulatory Visit: Admission: RE | Admit: 2007-12-06 | Discharge: 2007-12-06 | Payer: Self-pay | Admitting: Internal Medicine

## 2007-12-06 ENCOUNTER — Encounter (INDEPENDENT_AMBULATORY_CARE_PROVIDER_SITE_OTHER): Payer: Self-pay | Admitting: Internal Medicine

## 2007-12-13 ENCOUNTER — Ambulatory Visit: Payer: Self-pay | Admitting: Pulmonary Disease

## 2007-12-13 ENCOUNTER — Telehealth (INDEPENDENT_AMBULATORY_CARE_PROVIDER_SITE_OTHER): Payer: Self-pay | Admitting: *Deleted

## 2007-12-17 ENCOUNTER — Encounter (INDEPENDENT_AMBULATORY_CARE_PROVIDER_SITE_OTHER): Payer: Self-pay | Admitting: Internal Medicine

## 2007-12-22 ENCOUNTER — Ambulatory Visit: Payer: Self-pay | Admitting: Oncology

## 2007-12-24 LAB — CBC WITH DIFFERENTIAL/PLATELET
Basophils Absolute: 0.2 10*3/uL — ABNORMAL HIGH (ref 0.0–0.1)
EOS%: 0.6 % (ref 0.0–7.0)
HCT: 40.7 % (ref 34.8–46.6)
HGB: 13.4 g/dL (ref 11.6–15.9)
MCH: 29.3 pg (ref 26.0–34.0)
MCV: 89.3 fL (ref 81.0–101.0)
NEUT%: 14.4 % — ABNORMAL LOW (ref 39.6–76.8)
lymph#: 54.9 10*3/uL — ABNORMAL HIGH (ref 0.9–3.3)

## 2007-12-24 LAB — MORPHOLOGY

## 2007-12-28 ENCOUNTER — Telehealth (INDEPENDENT_AMBULATORY_CARE_PROVIDER_SITE_OTHER): Payer: Self-pay | Admitting: *Deleted

## 2008-01-16 ENCOUNTER — Ambulatory Visit: Payer: Self-pay | Admitting: Internal Medicine

## 2008-01-16 DIAGNOSIS — G4733 Obstructive sleep apnea (adult) (pediatric): Secondary | ICD-10-CM | POA: Insufficient documentation

## 2008-01-24 ENCOUNTER — Encounter (INDEPENDENT_AMBULATORY_CARE_PROVIDER_SITE_OTHER): Payer: Self-pay | Admitting: Internal Medicine

## 2008-02-20 ENCOUNTER — Ambulatory Visit: Payer: Self-pay | Admitting: Internal Medicine

## 2008-02-20 ENCOUNTER — Ambulatory Visit: Payer: Self-pay | Admitting: Oncology

## 2008-02-22 ENCOUNTER — Encounter (INDEPENDENT_AMBULATORY_CARE_PROVIDER_SITE_OTHER): Payer: Self-pay | Admitting: Internal Medicine

## 2008-02-22 LAB — COMPREHENSIVE METABOLIC PANEL
ALT: 20 U/L (ref 0–35)
AST: 19 U/L (ref 0–37)
Creatinine, Ser: 0.82 mg/dL (ref 0.40–1.20)
Total Bilirubin: 0.4 mg/dL (ref 0.3–1.2)

## 2008-02-22 LAB — MORPHOLOGY
PLT EST: ADEQUATE
RBC Comments: NORMAL

## 2008-02-22 LAB — LACTATE DEHYDROGENASE: LDH: 162 U/L (ref 94–250)

## 2008-02-22 LAB — CBC WITH DIFFERENTIAL/PLATELET
BASO%: 2.3 % — ABNORMAL HIGH (ref 0.0–2.0)
EOS%: 0.3 % (ref 0.0–7.0)
MCH: 29 pg (ref 26.0–34.0)
MCHC: 33.8 g/dL (ref 32.0–36.0)
MONO#: 1.3 10*3/uL — ABNORMAL HIGH (ref 0.1–0.9)
RBC: 4.39 10*6/uL (ref 3.70–5.32)
RDW: 13.4 % (ref 11.3–14.5)
WBC: 62.8 10*3/uL (ref 3.9–10.0)
lymph#: 53.6 10*3/uL — ABNORMAL HIGH (ref 0.9–3.3)

## 2008-02-27 ENCOUNTER — Ambulatory Visit: Payer: Self-pay | Admitting: Orthopedic Surgery

## 2008-03-07 ENCOUNTER — Encounter (INDEPENDENT_AMBULATORY_CARE_PROVIDER_SITE_OTHER): Payer: Self-pay | Admitting: Internal Medicine

## 2008-04-11 ENCOUNTER — Encounter (HOSPITAL_COMMUNITY): Admission: RE | Admit: 2008-04-11 | Discharge: 2008-05-11 | Payer: Self-pay | Admitting: Oncology

## 2008-04-11 ENCOUNTER — Ambulatory Visit (HOSPITAL_COMMUNITY): Payer: Self-pay | Admitting: Oncology

## 2008-04-25 ENCOUNTER — Ambulatory Visit (HOSPITAL_COMMUNITY): Admission: RE | Admit: 2008-04-25 | Discharge: 2008-04-25 | Payer: Self-pay | Admitting: Internal Medicine

## 2008-04-25 ENCOUNTER — Ambulatory Visit: Payer: Self-pay | Admitting: Internal Medicine

## 2008-04-25 DIAGNOSIS — E782 Mixed hyperlipidemia: Secondary | ICD-10-CM | POA: Insufficient documentation

## 2008-04-25 DIAGNOSIS — E785 Hyperlipidemia, unspecified: Secondary | ICD-10-CM

## 2008-04-25 LAB — CONVERTED CEMR LAB
Alkaline Phosphatase: 57 units/L (ref 39–117)
Basophils Relative: 0 % (ref 0–1)
CO2: 22 meq/L (ref 19–32)
Chloride: 107 meq/L (ref 96–112)
Glucose, Bld: 79 mg/dL (ref 70–99)
Hemoglobin: 14.4 g/dL (ref 12.0–15.0)
Lymphocytes Relative: 84 % — ABNORMAL HIGH (ref 12–46)
Lymphs Abs: 57 10*3/uL — ABNORMAL HIGH (ref 0.7–4.0)
MCHC: 32.6 g/dL (ref 30.0–36.0)
MCV: 88.4 fL (ref 78.0–100.0)
Monocytes Relative: 3 % (ref 3–12)
Neutrophils Relative %: 12 % — ABNORMAL LOW (ref 43–77)
Platelets: 293 10*3/uL (ref 150–400)
Total Bilirubin: 0.3 mg/dL (ref 0.3–1.2)
WBC: 68.2 10*3/uL (ref 4.0–10.5)

## 2008-04-26 ENCOUNTER — Encounter (INDEPENDENT_AMBULATORY_CARE_PROVIDER_SITE_OTHER): Payer: Self-pay | Admitting: Internal Medicine

## 2008-04-28 LAB — CONVERTED CEMR LAB
Cholesterol: 133 mg/dL
HDL: 31 mg/dL — ABNORMAL LOW
LDL Cholesterol: 65 mg/dL
Total CHOL/HDL Ratio: 4.3
Triglycerides: 186 mg/dL — ABNORMAL HIGH
VLDL: 37 mg/dL

## 2008-06-04 ENCOUNTER — Ambulatory Visit: Payer: Self-pay | Admitting: Oncology

## 2008-07-07 ENCOUNTER — Encounter (INDEPENDENT_AMBULATORY_CARE_PROVIDER_SITE_OTHER): Payer: Self-pay | Admitting: Internal Medicine

## 2008-07-07 LAB — COMPREHENSIVE METABOLIC PANEL
ALT: 16 U/L (ref 0–35)
CO2: 19 mEq/L (ref 19–32)
Calcium: 9.6 mg/dL (ref 8.4–10.5)
Chloride: 109 mEq/L (ref 96–112)
Glucose, Bld: 109 mg/dL — ABNORMAL HIGH (ref 70–99)
Sodium: 142 mEq/L (ref 135–145)
Total Bilirubin: 0.4 mg/dL (ref 0.3–1.2)
Total Protein: 6.6 g/dL (ref 6.0–8.3)

## 2008-07-07 LAB — LACTATE DEHYDROGENASE: LDH: 160 U/L (ref 94–250)

## 2008-07-07 LAB — CBC WITH DIFFERENTIAL/PLATELET
Basophils Absolute: 0.1 10*3/uL (ref 0.0–0.1)
EOS%: 0.4 % (ref 0.0–7.0)
HCT: 42.5 % (ref 34.8–46.6)
MCH: 29.1 pg (ref 26.0–34.0)
RDW: 14.8 % — ABNORMAL HIGH (ref 11.3–14.5)
lymph#: 45.6 10*3/uL — ABNORMAL HIGH (ref 0.9–3.3)

## 2008-07-07 LAB — MORPHOLOGY
PLT EST: ADEQUATE
RBC Comments: NORMAL

## 2008-08-07 ENCOUNTER — Encounter (INDEPENDENT_AMBULATORY_CARE_PROVIDER_SITE_OTHER): Payer: Self-pay | Admitting: Internal Medicine

## 2008-08-07 ENCOUNTER — Ambulatory Visit (HOSPITAL_COMMUNITY): Admission: RE | Admit: 2008-08-07 | Discharge: 2008-08-07 | Payer: Self-pay | Admitting: Internal Medicine

## 2008-09-15 ENCOUNTER — Ambulatory Visit: Payer: Self-pay | Admitting: Internal Medicine

## 2008-09-23 ENCOUNTER — Ambulatory Visit: Payer: Self-pay | Admitting: Internal Medicine

## 2008-09-23 DIAGNOSIS — M1610 Unilateral primary osteoarthritis, unspecified hip: Secondary | ICD-10-CM | POA: Insufficient documentation

## 2008-10-02 ENCOUNTER — Ambulatory Visit: Payer: Self-pay | Admitting: Oncology

## 2008-10-06 LAB — CBC WITH DIFFERENTIAL/PLATELET
EOS%: 0.3 % (ref 0.0–7.0)
LYMPH%: 79.6 % — ABNORMAL HIGH (ref 14.0–49.7)
MCH: 29 pg (ref 25.1–34.0)
MCHC: 32.8 g/dL (ref 31.5–36.0)
MCV: 88.4 fL (ref 79.5–101.0)
MONO%: 3.5 % (ref 0.0–14.0)
Platelets: 244 10*3/uL (ref 145–400)
RBC: 4.79 10*6/uL (ref 3.70–5.45)
RDW: 14.9 % — ABNORMAL HIGH (ref 11.2–14.5)

## 2008-10-06 LAB — MORPHOLOGY

## 2008-10-23 ENCOUNTER — Inpatient Hospital Stay (HOSPITAL_COMMUNITY): Admission: RE | Admit: 2008-10-23 | Discharge: 2008-10-26 | Payer: Self-pay | Admitting: Orthopedic Surgery

## 2008-10-23 ENCOUNTER — Encounter (INDEPENDENT_AMBULATORY_CARE_PROVIDER_SITE_OTHER): Payer: Self-pay | Admitting: Orthopedic Surgery

## 2008-11-12 ENCOUNTER — Telehealth (INDEPENDENT_AMBULATORY_CARE_PROVIDER_SITE_OTHER): Payer: Self-pay | Admitting: Internal Medicine

## 2008-12-29 ENCOUNTER — Ambulatory Visit: Payer: Self-pay | Admitting: Oncology

## 2009-01-06 ENCOUNTER — Encounter: Payer: Self-pay | Admitting: Cardiology

## 2009-01-06 ENCOUNTER — Encounter (INDEPENDENT_AMBULATORY_CARE_PROVIDER_SITE_OTHER): Payer: Self-pay | Admitting: Internal Medicine

## 2009-01-06 LAB — CBC WITH DIFFERENTIAL/PLATELET
BASO%: 0.3 % (ref 0.0–2.0)
HCT: 39.7 % (ref 34.8–46.6)
MCHC: 32.9 g/dL (ref 31.5–36.0)
MONO#: 1.7 10*3/uL — ABNORMAL HIGH (ref 0.1–0.9)
NEUT%: 14.7 % — ABNORMAL LOW (ref 38.4–76.8)
RBC: 4.51 10*6/uL (ref 3.70–5.45)
RDW: 13.4 % (ref 11.2–14.5)
WBC: 48.1 10*3/uL — ABNORMAL HIGH (ref 3.9–10.3)
lymph#: 38.8 10*3/uL — ABNORMAL HIGH (ref 0.9–3.3)

## 2009-01-06 LAB — MORPHOLOGY: PLT EST: ADEQUATE

## 2009-04-03 ENCOUNTER — Ambulatory Visit: Payer: Self-pay | Admitting: Oncology

## 2009-04-09 LAB — CBC WITH DIFFERENTIAL/PLATELET
Basophils Absolute: 0.1 10*3/uL (ref 0.0–0.1)
Eosinophils Absolute: 0.2 10*3/uL (ref 0.0–0.5)
HGB: 13.5 g/dL (ref 11.6–15.9)
MCV: 87.1 fL (ref 79.5–101.0)
MONO%: 2.8 % (ref 0.0–14.0)
NEUT#: 6.2 10*3/uL (ref 1.5–6.5)
Platelets: 233 10*3/uL (ref 145–400)
RDW: 15.7 % — ABNORMAL HIGH (ref 11.2–14.5)

## 2009-04-09 LAB — MORPHOLOGY: PLT EST: ADEQUATE

## 2009-05-30 ENCOUNTER — Emergency Department (HOSPITAL_COMMUNITY): Admission: EM | Admit: 2009-05-30 | Discharge: 2009-05-30 | Payer: Self-pay | Admitting: Emergency Medicine

## 2009-07-03 ENCOUNTER — Ambulatory Visit: Payer: Self-pay | Admitting: Oncology

## 2009-08-03 ENCOUNTER — Ambulatory Visit: Payer: Self-pay | Admitting: Oncology

## 2009-08-03 ENCOUNTER — Ambulatory Visit (HOSPITAL_COMMUNITY): Admission: RE | Admit: 2009-08-03 | Discharge: 2009-08-03 | Payer: Self-pay | Admitting: Oncology

## 2009-08-03 LAB — CBC WITH DIFFERENTIAL/PLATELET
Basophils Absolute: 0.3 10*3/uL — ABNORMAL HIGH (ref 0.0–0.1)
EOS%: 0.3 % (ref 0.0–7.0)
Eosinophils Absolute: 0.2 10*3/uL (ref 0.0–0.5)
HGB: 14.3 g/dL (ref 11.6–15.9)
MCH: 29.2 pg (ref 25.1–34.0)
NEUT#: 7.5 10*3/uL — ABNORMAL HIGH (ref 1.5–6.5)
RBC: 4.88 10*6/uL (ref 3.70–5.45)
RDW: 14.6 % — ABNORMAL HIGH (ref 11.2–14.5)
lymph#: 57.4 10*3/uL — ABNORMAL HIGH (ref 0.9–3.3)

## 2009-08-03 LAB — MORPHOLOGY: RBC Comments: NORMAL

## 2009-08-03 LAB — COMPREHENSIVE METABOLIC PANEL
AST: 18 U/L (ref 0–37)
Albumin: 4.8 g/dL (ref 3.5–5.2)
Alkaline Phosphatase: 57 U/L (ref 39–117)
BUN: 21 mg/dL (ref 6–23)
Potassium: 4.3 mEq/L (ref 3.5–5.3)
Total Bilirubin: 0.4 mg/dL (ref 0.3–1.2)

## 2009-08-19 ENCOUNTER — Ambulatory Visit (HOSPITAL_COMMUNITY): Admission: RE | Admit: 2009-08-19 | Discharge: 2009-08-19 | Payer: Self-pay | Admitting: Oncology

## 2009-10-30 ENCOUNTER — Ambulatory Visit: Payer: Self-pay | Admitting: Oncology

## 2009-11-03 LAB — CBC WITH DIFFERENTIAL/PLATELET
Basophils Absolute: 0.2 10*3/uL — ABNORMAL HIGH (ref 0.0–0.1)
Eosinophils Absolute: 0.4 10*3/uL (ref 0.0–0.5)
HCT: 41.5 % (ref 34.8–46.6)
HGB: 14 g/dL (ref 11.6–15.9)
LYMPH%: 82 % — ABNORMAL HIGH (ref 14.0–49.7)
MCV: 87.5 fL (ref 79.5–101.0)
MONO%: 2.6 % (ref 0.0–14.0)
NEUT#: 10 10*3/uL — ABNORMAL HIGH (ref 1.5–6.5)
Platelets: 250 10*3/uL (ref 145–400)

## 2009-11-03 LAB — MORPHOLOGY: PLT EST: ADEQUATE

## 2010-01-28 ENCOUNTER — Ambulatory Visit: Payer: Self-pay | Admitting: Oncology

## 2010-02-02 LAB — CBC WITH DIFFERENTIAL/PLATELET
Basophils Absolute: 0.8 10*3/uL — ABNORMAL HIGH (ref 0.0–0.1)
EOS%: 0.3 % (ref 0.0–7.0)
HGB: 13.7 g/dL (ref 11.6–15.9)
MCH: 29.3 pg (ref 25.1–34.0)
NEUT#: 8 10*3/uL — ABNORMAL HIGH (ref 1.5–6.5)
RDW: 14.5 % (ref 11.2–14.5)
lymph#: 57 10*3/uL — ABNORMAL HIGH (ref 0.9–3.3)

## 2010-02-02 LAB — COMPREHENSIVE METABOLIC PANEL
AST: 20 U/L (ref 0–37)
Albumin: 4.5 g/dL (ref 3.5–5.2)
Alkaline Phosphatase: 53 U/L (ref 39–117)
BUN: 18 mg/dL (ref 6–23)
Creatinine, Ser: 0.83 mg/dL (ref 0.40–1.20)
Potassium: 3.9 mEq/L (ref 3.5–5.3)
Total Bilirubin: 0.3 mg/dL (ref 0.3–1.2)

## 2010-02-02 LAB — MORPHOLOGY: PLT EST: ADEQUATE

## 2010-02-05 ENCOUNTER — Ambulatory Visit (HOSPITAL_COMMUNITY): Admission: RE | Admit: 2010-02-05 | Discharge: 2010-02-05 | Payer: Self-pay | Admitting: Cardiology

## 2010-02-09 ENCOUNTER — Encounter (HOSPITAL_COMMUNITY): Admission: RE | Admit: 2010-02-09 | Discharge: 2010-02-09 | Payer: Self-pay | Admitting: Cardiology

## 2010-02-17 ENCOUNTER — Encounter: Payer: Self-pay | Admitting: Cardiology

## 2010-04-30 ENCOUNTER — Ambulatory Visit: Payer: Self-pay | Admitting: Oncology

## 2010-05-06 LAB — CBC WITH DIFFERENTIAL/PLATELET
Basophils Absolute: 0.1 10*3/uL (ref 0.0–0.1)
Eosinophils Absolute: 0.3 10*3/uL (ref 0.0–0.5)
HGB: 14.1 g/dL (ref 11.6–15.9)
MCV: 87.7 fL (ref 79.5–101.0)
MONO#: 1.7 10*3/uL — ABNORMAL HIGH (ref 0.1–0.9)
MONO%: 2.8 % (ref 0.0–14.0)
NEUT#: 5.1 10*3/uL (ref 1.5–6.5)
Platelets: 226 10*3/uL (ref 145–400)
RBC: 4.81 10*6/uL (ref 3.70–5.45)
RDW: 15.1 % — ABNORMAL HIGH (ref 11.2–14.5)
WBC: 61.1 10*3/uL (ref 3.9–10.3)
nRBC: 0 % (ref 0–0)

## 2010-05-06 LAB — MORPHOLOGY: PLT EST: ADEQUATE

## 2010-06-20 LAB — CONVERTED CEMR LAB
AST: 20 units/L
Albumin: 4.4 g/dL
Calcium: 10.1 mg/dL
Creatinine, Ser: 1.01 mg/dL
Glucose, Bld: 91 mg/dL

## 2010-06-24 NOTE — Letter (Signed)
Summary: [facility] NOTE 02/02/10  [facility] NOTE 02/02/10   Imported By: Faythe Ghee 02/17/2010 15:50:53  _____________________________________________________________________  External Attachment:    Type:   Image     Comment:   External Document

## 2010-08-03 ENCOUNTER — Other Ambulatory Visit: Payer: Self-pay | Admitting: Oncology

## 2010-08-03 ENCOUNTER — Encounter (HOSPITAL_BASED_OUTPATIENT_CLINIC_OR_DEPARTMENT_OTHER): Payer: Medicare Other | Admitting: Oncology

## 2010-08-03 DIAGNOSIS — C9110 Chronic lymphocytic leukemia of B-cell type not having achieved remission: Secondary | ICD-10-CM

## 2010-08-03 DIAGNOSIS — Z853 Personal history of malignant neoplasm of breast: Secondary | ICD-10-CM

## 2010-08-03 LAB — MORPHOLOGY: PLT EST: ADEQUATE

## 2010-08-03 LAB — COMPREHENSIVE METABOLIC PANEL
ALT: 20 U/L (ref 0–35)
CO2: 18 mEq/L — ABNORMAL LOW (ref 19–32)
Calcium: 9.4 mg/dL (ref 8.4–10.5)
Chloride: 107 mEq/L (ref 96–112)
Creatinine, Ser: 0.85 mg/dL (ref 0.40–1.20)
Glucose, Bld: 96 mg/dL (ref 70–99)
Total Bilirubin: 0.4 mg/dL (ref 0.3–1.2)

## 2010-08-03 LAB — CBC WITH DIFFERENTIAL/PLATELET
BASO%: 0.3 % (ref 0.0–2.0)
Basophils Absolute: 0.2 10*3/uL — ABNORMAL HIGH (ref 0.0–0.1)
EOS%: 0.3 % (ref 0.0–7.0)
HGB: 13.8 g/dL (ref 11.6–15.9)
MCH: 30 pg (ref 25.1–34.0)
MCHC: 33.6 g/dL (ref 31.5–36.0)
MCV: 89.3 fL (ref 79.5–101.0)
MONO%: 2.8 % (ref 0.0–14.0)
RBC: 4.6 10*6/uL (ref 3.70–5.45)
RDW: 13.9 % (ref 11.2–14.5)
lymph#: 47.3 10*3/uL — ABNORMAL HIGH (ref 0.9–3.3)

## 2010-08-03 LAB — LACTATE DEHYDROGENASE: LDH: 166 U/L (ref 94–250)

## 2010-08-30 LAB — BASIC METABOLIC PANEL
CO2: 23 mEq/L (ref 19–32)
CO2: 26 mEq/L (ref 19–32)
Calcium: 8 mg/dL — ABNORMAL LOW (ref 8.4–10.5)
Chloride: 110 mEq/L (ref 96–112)
Chloride: 115 mEq/L — ABNORMAL HIGH (ref 96–112)
Creatinine, Ser: 0.76 mg/dL (ref 0.4–1.2)
GFR calc Af Amer: >60 mL/min (ref >60)
GFR calc Af Amer: >60 mL/min (ref >60)
Glucose, Bld: 141 mg/dL — ABNORMAL HIGH (ref 70–99)
Potassium: 3.8 mEq/L (ref 3.5–5.1)

## 2010-08-30 LAB — CROSSMATCH: ABO/RH(D): A POS

## 2010-08-30 LAB — URINE CULTURE

## 2010-08-30 LAB — CBC
HCT: 24.8 % — ABNORMAL LOW (ref 36.0–46.0)
HCT: 33.2 % — ABNORMAL LOW (ref 36.0–46.0)
Hemoglobin: 9.3 g/dL — ABNORMAL LOW (ref 12.0–15.0)
MCHC: 33.2 g/dL (ref 30.0–36.0)
MCHC: 33.2 g/dL (ref 30.0–36.0)
MCHC: 33.6 g/dL (ref 30.0–36.0)
MCV: 90.4 fL (ref 78.0–100.0)
MCV: 90.4 fL (ref 78.0–100.0)
MCV: 90.6 fL (ref 78.0–100.0)
Platelets: 160 10*3/uL (ref 150–400)
RBC: 2.75 MIL/uL — ABNORMAL LOW (ref 3.87–5.11)
RBC: 3.08 MIL/uL — ABNORMAL LOW (ref 3.87–5.11)
RDW: 14.2 % (ref 11.5–15.5)
RDW: 14.5 % (ref 11.5–15.5)
WBC: 42 10*3/uL — ABNORMAL HIGH (ref 4.0–10.5)

## 2010-08-30 LAB — ABO/RH: ABO/RH(D): A POS

## 2010-08-31 LAB — COMPREHENSIVE METABOLIC PANEL
ALT: 25 U/L (ref 0–35)
Alkaline Phosphatase: 64 U/L (ref 39–117)
BUN: 16 mg/dL (ref 6–23)
CO2: 22 mEq/L (ref 19–32)
Calcium: 9.6 mg/dL (ref 8.4–10.5)
GFR calc non Af Amer: >60 mL/min (ref >60)
Glucose, Bld: 94 mg/dL (ref 70–99)
Sodium: 142 mEq/L (ref 135–145)

## 2010-08-31 LAB — URINALYSIS, ROUTINE W REFLEX MICROSCOPIC
Bilirubin Urine: NEGATIVE
Ketones, ur: NEGATIVE mg/dL
Nitrite: NEGATIVE
Protein, ur: NEGATIVE mg/dL
Urobilinogen, UA: 0.2 mg/dL (ref 0.0–1.0)
pH: 5.5 (ref 5.0–8.0)

## 2010-08-31 LAB — DIFFERENTIAL
Basophils Absolute: 0 10*3/uL (ref 0.0–0.1)
Lymphs Abs: 59.1 10*3/uL — ABNORMAL HIGH (ref 0.7–4.0)
Monocytes Absolute: 1.4 10*3/uL — ABNORMAL HIGH (ref 0.1–1.0)
Monocytes Relative: 2 % — ABNORMAL LOW (ref 3–12)

## 2010-08-31 LAB — CBC
HCT: 43.5 % (ref 36.0–46.0)
Hemoglobin: 14.2 g/dL (ref 12.0–15.0)
MCHC: 32.6 g/dL (ref 30.0–36.0)
RBC: 4.82 MIL/uL (ref 3.87–5.11)

## 2010-08-31 LAB — URINE MICROSCOPIC-ADD ON

## 2010-08-31 LAB — PROTIME-INR: Prothrombin Time: 14.5 seconds (ref 11.6–15.2)

## 2010-10-05 NOTE — Procedures (Signed)
NAMETALISE, SLIGH             ACCOUNT NO.:  [id number]   MEDICAL RECORD NO.:  [id number]          PATIENT TYPE:  OUT   LOCATION:  SLEEP LAB                     FACILITY:  [facility]   PHYSICIAN:  Barbaraann Share, MD,FCCPDATE OF BIRTH:  1936/03/08   DATE OF STUDY:  12/06/2007                            NOCTURNAL POLYSOMNOGRAM   REFERRING PHYSICIAN:  Erle Crocker, M.D.   INDICATION FOR STUDY:  Hypersomnia with sleep apnea.   EPWORTH SLEEPINESS SCORE:  11.   SLEEP ARCHITECTURE:  The patient had total sleep time of 221 minutes  with very little slow wave sleep or REM noted until the titration  portion of the study.  Sleep onset latency was prolonged at 70 minutes,  and sleep efficiency was very poor during the diagnostic portion at 37%  and improved to 74% during the titration portion.   RESPIRATORY DATA:  The patient underwent split night protocol, where she  was found to have 54 obstructive events in the first 71 minutes of  sleep.  This gave the patient an AHI of 46 events per hour during the  diagnostic portion of the study.  All the events occurred during supine  sleep, and there was loud snoring noted throughout.  By protocol the  patient was then placed on a small ResMed Quattro full-face mask and  ultimately titrated to a final pressure of 6 cm.  It should be noted the  patient had no supine sleep during the entire CPAP titration portion due  to difficulty staying on her back because of discomfort.   OXYGEN DATA:  There was O2 desaturation as low as 89% with her  obstructive events.   CARDIAC DATA:  Occasional PVCs but no clinically significant arrhythmia.   MOVEMENT/PARASOMNIA:  No periodic leg movements or abnormal behaviors  were noted.   IMPRESSION/RECOMMENDATION:  1. Severe obstructive sleep apnea/hypopnea syndrome noted during the      diagnostic portion of the study with an AHI of 46 events per hour      and O2 desaturation as low as 89%.  The patient was then  placed on      a small ResMed Quattro full-face mask and titrated to a final      pressure of 6 cm.  It should be noted the patient had no supine      sleep during the titration portion of the study because of      significant back discomfort.  I believe this is important because      most of her obstructive events during the initial portion of the      study were all in the supine position.  Clinical correlation is      suggested.  If the patient does not respond clinically as expected,      she may      require a return to the sleep lab for formal titration versus an      auto-titrating device in her home.  2. Occasional PVC but no clinically significant arrhythmia.      Barbaraann Share, MD,FCCP  Diplomate, American Board of Sleep  Medicine  Electronically Signed  KMC/MEDQ  D:  12/13/2007 19:50:53  T:  12/13/2007 11:91:47  Job:  [id number]

## 2010-10-05 NOTE — Discharge Summary (Signed)
[name], [name]             ACCOUNT NO.:  [id number]   MEDICAL RECORD NO.:  [id number]          PATIENT TYPE:  INP   LOCATION:  1529                         FACILITY:  [facility]   PHYSICIAN:  John L. Rendall, M.D.  DATE OF BIRTH:  1936-01-18   DATE OF ADMISSION:  10/23/2008  DATE OF DISCHARGE:  10/26/2008                               DISCHARGE SUMMARY   ADMISSION DIAGNOSES:  1. End-stage osteoarthritis, right hip.  2. Obesity.  3. Chronic lymphocytic leukemia.  4. Allergies.  5. Coronary artery disease with history of cath.  6. Gastroesophageal reflux disease.  7. Anxiety.  8. Sleep apnea.  9. Hyperlipidemia.  10.History of breast cancer.  11.History of migraines.   DISCHARGE DIAGNOSES:  1. End-stage osteoarthritis, right hip, status post right total hip      arthroplasty.  2. Acute blood loss anemia secondary to surgery.  3. Constipation.  4. Obesity.  5. Chronic lymphocytic leukemia.  6. Allergies.  7. Coronary artery disease with history of negative cath.  8. Gastroesophageal reflux disease.  9. Anxiety.  10.Sleep apnea.  11.Hyperlipidemia.  12.History of breast cancer.  13.History of migraines.   SURGICAL PROCEDURES:  On October 23, 2008 [name] underwent a right  total hip arthroplasty by Dr. Jonny Ruiz L.  Rendall, assisted by Arnoldo Morale  PA-C.  She had a Pinnacle 100 series acetabular cup. size 50 mm placed  with an apex hole eliminator and a Pinnacle Altrex +4 10 degree 32 mm  inner diameter x 50 mm outer diameter polyethylene placed, an AML small  stature 155 mm length, 43 mm offset size 15 femoral stem with an  Articules femoral head 32 mm +5 neck length, 12/14 taper.  An Articules  +1 femoral head was tried first but that was too  short.   COMPLICATIONS:  None.   CONSULTANTS:  1. Physical therapy and occupational therapy consult October 24, 2008.  2. Respiratory therapy consult October 24, 2008.   HISTORY OF PRESENT ILLNESS:  This 75 year old white female  patient  presented Dr. Priscille Kluver with a 3-year history of gradual onset of  progressive right hip pain.  The pain is a constant sharp to throbbing  sensation in the right groin with radiation into the knee.  It increases  with sleep on the right side and walking and decreases with rest.  The  hip gives.  It keeps her up at night.  She has failed conservative  treatment and x-rays show end-stage arthritic changes.  Because of that,  she is presenting for a right hip replacement.   HOSPITAL COURSE:  [name] tolerated her surgical procedure well  without immediate postoperative complications.  She was transferred to  orthopedic floor.  On postop day #1, she had some severe nausea and  vomiting the night before due to the Dilaudid PCA.  Once that was  removed, it completely resolved.  Her T-max was 99 and vitals were  stable.  White count was elevated at due to her CLL at 59.3, hemoglobin  9.3, hematocrit 27.9.  There was some question of maybe a preop UTI, so  a urine  culture was obtained which showed no growth while she was in the  hospital.  She was started on therapy per protocol.   On postop day #2, she had complained of a headache for most of the day.  Her hemoglobin was low at 8.2 and she was transfused with 2 units of  packed red blood cells.  Some pain was well-controlled with p.o. meds.  Her Foley was discontinued and she was continued on therapy.   On postop day #3, she feels much better.  Her headache is completely  gone and she feels better after the transfusion.  She is tolerating her  diet well.  She is afebrile, vitals were stable.  Hemoglobin 11.1,  hematocrit 33.2, white count is 43.3.  Her right hip incision is well-  approximated with staples, just minimal serosanguineous drainage.  She  is having some constipation which will be treated with a laxative but it  is felt she is ready for discharge home and will be discharged home  later today.   DISCHARGE  INSTRUCTIONS:  Diet:  She is going to resume her regular  prehospitalization diet.   MEDICATIONS:  She may resume her home meds as follows:  1. Megace stroll 40 mg p.o. q.a.m..  2. Prilosec 20 mg p.o. q.a.m.Marland Kitchen  3. Aspirin 81 mg p.o. q.p.m. she is to resume once her Arixtra is      completed.  4. Fluticasone 2 sprays in each nostril q.a.m..  5. Lodrain as needed.  6. Simvastatin 20 mg p.o. q.h.s..   Additional medicines at this time include:  1. Arixtra 2.5 mg subcu q. 8 p.m. with a last dose on June 12.  She is      to resume her aspirin on June 13 and she is given five with no      refill.  She does have samples at home.  2. Celebrex 200 mg 1 tablet p.o. b.i.d. for 1 week and then 1 tablet      p.o. daily 30 with no refill.  3. Percocet 5/325 one to two p.o. q.4 h. p.r.n. for pain 60 with no      refill.  4. Robaxin 500 mg one to two tablets p.o. q.6 h. p.r.n. for spasm, 60      with no refill.   ACTIVITY:  She can be out of bed weightbearing as tolerated on the right  leg with use of a walker.  No lifting or driving for 6 weeks.  Please  see the blue total hip discharge sheet for further activity  instructions.   Wound care:  She is to keep the right hip incision clean and dry.  May  shower after no drainage from the wound for 2 days.  Please see the blue  total hip discharge sheet for further wound care instructions.   Physical therapy:  She is arranged for home health PT per [facility].   FOLLOWUP:  She needs to follow up with Dr. Priscille Kluver in our office on  Monday November 03, 2008.  She needs to call [phone] for that appointment.   LABORATORY DATA:  Hemoglobin and hematocrit ranged from 14.2 and 43.5 on  May 28 to a low of 8.2 and 24.8 on the 5th to 11.1 and 33.2 on the 6th.  White count went from 68.7 on May 28 to a low of 42 on June 5 to 43.3 on  the 6th.  Platelets remained within normal limits.   UA on May  28 showed cloudy yellow urine, moderate  hemoglobin, large  leukocyte esterase, and too numerous to count white blood cells.  Urine  culture, however, after surgery showed no growth to date.   Chloride dropped to a low of 115 on the 5th.  Her glucose ranged from 94  on May 28 to a high of 141 on the 4th.  All other laboratory studies  were within normal limits.      Legrand Pitts Duffy, P.A.      John L. Rendall, M.D.  Electronically Signed    KED/MEDQ  D:  10/26/2008  T:  10/27/2008  Job:  [id number]   cc:   Genene Churn. Cyndie Chime, M.D.  Fax: [phone]

## 2010-10-05 NOTE — Op Note (Signed)
[name], [name]             ACCOUNT NO.:  [id number]   MEDICAL RECORD NO.:  [id number]          PATIENT TYPE:  INP   LOCATION:  0006                         FACILITY:  [facility]   PHYSICIAN:  John L. Rendall, M.D.  DATE OF BIRTH:  1936/02/24   DATE OF PROCEDURE:  10/23/2008  DATE OF DISCHARGE:                               OPERATIVE REPORT   PREOPERATIVE DIAGNOSIS:  Osteoarthritis, right hip.   SURGICAL PROCEDURE:  DePuy AML right total hip arthroplasty.   POSTOPERATIVE DIAGNOSIS:  Osteoarthritis, right hip.   SURGEON:  John L. Rendall, M.D.   ASSISTANTAlisa Graff, PA-C, present and participating in the entire case.   ANESTHESIA:  General.   PATHOLOGY:  The patient has end-stage OA of the hip.  Conservative  measures including injection have been tried but she persists in having  disabling pain with weightbearing.  No sign of her leukemia being  involved in the hip but the hip ball is still sent to pathology.   PROCEDURE:  Under general anesthesia, the patient is placed in the left  lateral decubitus position.  Posterior approach is made after DuraPrep  scrub and routine draping.  IT band is opened in the line of its fibers.  The short external rotators were taken down from the bone and the hip  capsule is cauterized repeatedly.  There is extensive blood loss in the  pericapsular vessels.  Once this was under control the hip capsule was  opened in a T-shaped manner.  The hip was dislocated.  The superior  femoral neck is exposed.  The IM initiator and canal finder are used.  The hip canal was then progressively reamed to 14.5 mm.  At this point,  the femoral neck is osteotomized.  It is then rasped using 12, 13.5 and  15 narrow rasps.  The 15 narrow, even with side cutting reamers to  expand the proximal triangle, did not seat the component fully, lacking  about 2-3 mm.  Calcar reamer was done however to make sure everything  was symmetrical.  At this point the acetabulum was  exposed.  The capsule  was elevated from the labrum and 2 wing retractors were placed on the  posterior wall of the acetabulum.  The labrum was then excised along  with the ligamentum teres.  The hip was then progressively reamed 43,  45, 46, 48, 49 and reaming stopped at 49.  An excellent circumferential  fit was obtained on trial seating of a 48 cup.  More reaming was then  done with a 49 and the 50-mm pinnacle acetabular cup was then inserted.  Sputnik was used to angle the cup appropriately.  Once this was done, it  was adjusted one time for being slightly too vertical.  With this  however corrected a trial acetabular poly was inserted plus four 10-  degrees angles for a 32-mm hip ball.  The rasp was then reinserted and  trial of a hip ball of a standard neck, short stature AML was done.  The  hip was stable through full range of motion and leg lengths appeared to  be  equal.  With this being said, permanent components were obtained.  The apex hole eliminator was placed in the acetabulum.  The pinnacle  liner was inserted and the femoral component was subsequently inserted  and it should be noted that impacting, a small crack that did not appear  to propagate occurred at the femoral neck allowing seating of the  component by about 1 or 2 more millimeters.  The 32 +2 hip ball was then  tried and at this point there was some dislocation seen with internal  rotation that was not seen before the stem seated more deeply.  The +5  neck length corrected this and after trialing a +5 hip ball, the 32 mm  +5 was then used.  The hip was then stable through full normal range of  motion.  The hip capsule was then closed with #1 Tycron piriformis was  reattached with #1 Tycron, the external rotators were loosely reattached  with #1 Tycron.  Sciatic nerve was  visualized and undamaged just lying there.  The wound was then closed,  closing the IT band with mattress sutures of #1 Tycron, layers of #1   Vicryl, 2-0 Vicryl and skin clips.  Operative time about an hour and 5  minutes.  The patient tolerated the procedure well and returned to  recovery in good condition.      John L. Rendall, M.D.  Electronically Signed     JLR/MEDQ  D:  10/23/2008  T:  10/23/2008  Job:  [id number]

## 2010-10-05 NOTE — Op Note (Signed)
NAMEJALAN, BODI             ACCOUNT NO.:  [id number]   MEDICAL RECORD NO.:  [id number]          PATIENT TYPE:  AMB   LOCATION:  DAY                           FACILITY:  [facility]   PHYSICIAN:  Lionel December, M.D.    DATE OF BIRTH:  September 19, 1935   DATE OF PROCEDURE:  08/07/2008  DATE OF DISCHARGE:                               OPERATIVE REPORT   PROCEDURE:  Colonoscopy.   INDICATION:  [name] is a 75 year old Caucasian female who gives history  of colonic polyp.  She had been examined 6 years ago in [place] and  she said that she had 1 poly that was not removed.   FAMILY HISTORY:  Negative for colorectal carcinoma.  Lately, she has  noted some constipation and intermittent lower abdominal pain relieved  with defecation.  She is undergoing a colonoscopy both for diagnostic  and surveillance purposes.   Procedure and risks were reviewed with the patient, and informed consent  was obtained.   MEDICATIONS FOR CONSCIOUS SEDATION:  Demerol 40 mg IV, Versed 4 mg IV.   FINDINGS:  Procedure performed in endoscopy suite.  The patient's vital  signs and O2 sats were monitored during the procedure and remained  stable.  The patient was placed in left lateral recumbent position and  rectal examination performed.  No abnormality noted on external or  digital exam.  Pentax videoscope was placed in the rectum and advanced  under vision into sigmoid colon beyond.  A few small diverticula were  noted at sigmoid colon.  Preparation was satisfactory.  The scope was  passed into cecum, which was identified by appendiceal orifice and  ileocecal valve.  Pictures taken for the record.  As the scope was  withdrawn, the colonic mucosa was carefully examined.  There was a 4-mm  polyp at descending colon, which was ablated by a cold biopsy.  Pictures  taken for the record.  Mucosa of rest of the colon was normal.  Rectal  mucosa similarly was normal.  The scope was retroflexed to examine  anorectal  junction, which is unremarkable.  Endoscope was straightened  and withdrawn.  The patient tolerated the procedure well.   The scope withdrawal time was 7 minutes.   FINAL DIAGNOSES:  1. Examination performed to cecum.  2. Few small diverticula at sigmoid colon.   A 4-mm polyp ablated by cold biopsy from descending colon.   RECOMMENDATIONS:  1. High-fiber diet.  2. I will be contacting the patient with results of biopsy and further      recommendations.      Lionel December, M.D.  Electronically Signed     NR/MEDQ  D:  08/07/2008  T:  08/08/2008  Job:  [id number]   cc:   Erle Crocker, M.D.   Genene Churn. Cyndie Chime, M.D.  Fax: [phone]

## 2010-11-09 ENCOUNTER — Encounter (HOSPITAL_BASED_OUTPATIENT_CLINIC_OR_DEPARTMENT_OTHER): Payer: Medicare Other | Admitting: Oncology

## 2010-11-09 ENCOUNTER — Other Ambulatory Visit: Payer: Self-pay | Admitting: Oncology

## 2010-11-09 DIAGNOSIS — Z23 Encounter for immunization: Secondary | ICD-10-CM

## 2010-11-09 DIAGNOSIS — C9110 Chronic lymphocytic leukemia of B-cell type not having achieved remission: Secondary | ICD-10-CM

## 2010-11-09 DIAGNOSIS — Z853 Personal history of malignant neoplasm of breast: Secondary | ICD-10-CM

## 2010-11-09 LAB — CBC WITH DIFFERENTIAL/PLATELET
BASO%: 0.2 % (ref 0.0–2.0)
EOS%: 0.5 % (ref 0.0–7.0)
HCT: 40.6 % (ref 34.8–46.6)
LYMPH%: 89.8 % — ABNORMAL HIGH (ref 14.0–49.7)
MCH: 29 pg (ref 25.1–34.0)
MCHC: 33.7 g/dL (ref 31.5–36.0)
NEUT%: 8.2 % — ABNORMAL LOW (ref 38.4–76.8)
Platelets: 240 10*3/uL (ref 145–400)

## 2011-02-15 ENCOUNTER — Other Ambulatory Visit: Payer: Self-pay | Admitting: Oncology

## 2011-02-15 ENCOUNTER — Encounter (HOSPITAL_BASED_OUTPATIENT_CLINIC_OR_DEPARTMENT_OTHER): Payer: Medicare Other | Admitting: Oncology

## 2011-02-15 DIAGNOSIS — C9111 Chronic lymphocytic leukemia of B-cell type in remission: Secondary | ICD-10-CM

## 2011-02-15 DIAGNOSIS — C9110 Chronic lymphocytic leukemia of B-cell type not having achieved remission: Secondary | ICD-10-CM

## 2011-02-15 DIAGNOSIS — C9112 Chronic lymphocytic leukemia of B-cell type in relapse: Secondary | ICD-10-CM

## 2011-02-15 DIAGNOSIS — Z853 Personal history of malignant neoplasm of breast: Secondary | ICD-10-CM

## 2011-02-15 DIAGNOSIS — Z923 Personal history of irradiation: Secondary | ICD-10-CM

## 2011-02-15 DIAGNOSIS — Z139 Encounter for screening, unspecified: Secondary | ICD-10-CM

## 2011-02-15 LAB — COMPREHENSIVE METABOLIC PANEL
ALT: 13 U/L (ref 0–35)
Albumin: 4.3 g/dL (ref 3.5–5.2)
Alkaline Phosphatase: 67 U/L (ref 39–117)
CO2: 17 mEq/L — ABNORMAL LOW (ref 19–32)
Glucose, Bld: 235 mg/dL — ABNORMAL HIGH (ref 70–99)
Potassium: 4.1 mEq/L (ref 3.5–5.3)
Sodium: 139 mEq/L (ref 135–145)
Total Protein: 6.4 g/dL (ref 6.0–8.3)

## 2011-02-15 LAB — CBC WITH DIFFERENTIAL/PLATELET
Basophils Absolute: 0.2 10*3/uL — ABNORMAL HIGH (ref 0.0–0.1)
EOS%: 0.6 % (ref 0.0–7.0)
HCT: 42 % (ref 34.8–46.6)
HGB: 14 g/dL (ref 11.6–15.9)
MCH: 29.4 pg (ref 25.1–34.0)
MCV: 88.2 fL (ref 79.5–101.0)
MONO%: 2.6 % (ref 0.0–14.0)
NEUT%: 12.6 % — ABNORMAL LOW (ref 38.4–76.8)

## 2011-02-15 LAB — URIC ACID: Uric Acid, Serum: 4.5 mg/dL (ref 2.4–7.0)

## 2011-02-15 LAB — MORPHOLOGY

## 2011-02-17 ENCOUNTER — Ambulatory Visit (HOSPITAL_COMMUNITY)
Admission: RE | Admit: 2011-02-17 | Discharge: 2011-02-17 | Disposition: A | Discharge status: Home / Self Care | Payer: Medicare Other | Source: Ambulatory Visit | Attending: Oncology | Admitting: Oncology

## 2011-02-17 DIAGNOSIS — Z1231 Encounter for screening mammogram for malignant neoplasm of breast: Secondary | ICD-10-CM | POA: Insufficient documentation

## 2011-02-17 DIAGNOSIS — Z139 Encounter for screening, unspecified: Secondary | ICD-10-CM

## 2011-02-17 LAB — HEPATIC FUNCTION PANEL
Bilirubin, Direct: 0.2
Total Bilirubin: 0.7

## 2011-02-17 LAB — POCT I-STAT 3, VENOUS BLOOD GAS (G3P V)
Operator id: 150521
pH, Ven: 7.358 — ABNORMAL HIGH

## 2011-02-22 LAB — DIFFERENTIAL
Basophils Absolute: 0
Basophils Relative: 0
Eosinophils Relative: 0
Lymphocytes Relative: 90 — ABNORMAL HIGH
Lymphs Abs: 61.4 — ABNORMAL HIGH
Monocytes Relative: 4
Neutro Abs: 4.1
Neutrophils Relative %: 6 — ABNORMAL LOW
Promyelocytes Absolute: 0

## 2011-02-22 LAB — CBC
Hemoglobin: 14.4
RBC: 4.9
WBC: 68.2

## 2011-04-20 ENCOUNTER — Other Ambulatory Visit: Payer: Self-pay | Admitting: *Deleted

## 2011-04-20 ENCOUNTER — Encounter: Payer: Self-pay | Admitting: *Deleted

## 2011-04-20 DIAGNOSIS — R232 Flushing: Secondary | ICD-10-CM

## 2011-04-20 MED ORDER — MEGESTROL ACETATE 40 MG PO TABS: 40.0000 mg | ORAL_TABLET | Freq: Every day | ORAL | Status: DC

## 2011-05-10 ENCOUNTER — Other Ambulatory Visit: Payer: Medicare Other | Admitting: Lab

## 2011-05-19 ENCOUNTER — Other Ambulatory Visit: Payer: Medicare Other | Admitting: Lab

## 2011-06-06 ENCOUNTER — Other Ambulatory Visit: Payer: Self-pay | Admitting: *Deleted

## 2011-06-06 DIAGNOSIS — C50419 Malignant neoplasm of upper-outer quadrant of unspecified female breast: Secondary | ICD-10-CM

## 2011-06-06 MED ORDER — MEGESTROL ACETATE 40 MG PO TABS: 40.0000 mg | ORAL_TABLET | Freq: Every day | ORAL | Status: AC

## 2011-07-08 ENCOUNTER — Telehealth: Payer: Self-pay | Admitting: *Deleted

## 2011-07-08 NOTE — Telephone Encounter (Signed)
Received call from pt stating that she was told to talk with nurse or Dr.G before appt to be scheduled.  She reports that she is not having any problems at this time.  She reports that she has moved to [place].  Will discuss with Dr. Cyndie Chime.

## 2011-07-21 ENCOUNTER — Other Ambulatory Visit: Payer: Self-pay | Admitting: Oncology

## 2011-07-21 ENCOUNTER — Encounter: Payer: Self-pay | Admitting: Oncology

## 2011-07-21 DIAGNOSIS — C50919 Malignant neoplasm of unspecified site of unspecified female breast: Secondary | ICD-10-CM

## 2011-07-21 DIAGNOSIS — C9110 Chronic lymphocytic leukemia of B-cell type not having achieved remission: Secondary | ICD-10-CM | POA: Insufficient documentation

## 2011-07-21 HISTORY — DX: Chronic lymphocytic leukemia of B-cell type not having achieved remission: C91.10

## 2011-07-21 HISTORY — DX: Malignant neoplasm of unspecified site of unspecified female breast: C50.919

## 2011-08-04 ENCOUNTER — Telehealth: Payer: Self-pay | Admitting: Oncology

## 2011-08-04 NOTE — Telephone Encounter (Signed)
Called pt, telephone number is disconnected, called pt contact, also disconnected. Appt calendar was mailed to pt today , for May , lab and MD

## 2011-08-08 ENCOUNTER — Telehealth: Payer: Self-pay

## 2011-08-08 NOTE — Telephone Encounter (Signed)
Received VM from pt questioning if Dr Cyndie Chime still wanted to see her this month.  Pt was seen 02/15/11, & is due for 6 month f/u.    Pt notified by phone that appt has been made for 5/13.  She is aware that Dr Cyndie Chime approved this appt and is comfortable with her going 8 months rather than 6.  Aware the change is d/t epic conversion.  Pt denies any complaints.    dph

## 2011-09-14 ENCOUNTER — Other Ambulatory Visit: Payer: Self-pay | Admitting: *Deleted

## 2011-09-14 DIAGNOSIS — R232 Flushing: Secondary | ICD-10-CM

## 2011-09-14 MED ORDER — MEGESTROL ACETATE 40 MG PO TABS: 40.0000 mg | ORAL_TABLET | Freq: Every day | ORAL | Status: DC

## 2011-10-03 ENCOUNTER — Other Ambulatory Visit (HOSPITAL_BASED_OUTPATIENT_CLINIC_OR_DEPARTMENT_OTHER): Payer: Medicare Other | Admitting: Lab

## 2011-10-03 ENCOUNTER — Ambulatory Visit (HOSPITAL_COMMUNITY)
Admission: RE | Admit: 2011-10-03 | Discharge: 2011-10-03 | Disposition: A | Discharge status: Home / Self Care | Payer: Medicare Other | Source: Ambulatory Visit | Attending: Oncology | Admitting: Oncology

## 2011-10-03 ENCOUNTER — Other Ambulatory Visit: Payer: Self-pay

## 2011-10-03 ENCOUNTER — Ambulatory Visit (HOSPITAL_BASED_OUTPATIENT_CLINIC_OR_DEPARTMENT_OTHER): Payer: Medicare Other | Admitting: Oncology

## 2011-10-03 VITALS — BP 142/95 | HR 84 | Temp 97.3°F | Ht 62.5 in | Wt 187.7 lb

## 2011-10-03 DIAGNOSIS — F329 Major depressive disorder, single episode, unspecified: Secondary | ICD-10-CM

## 2011-10-03 DIAGNOSIS — M25511 Pain in right shoulder: Secondary | ICD-10-CM

## 2011-10-03 DIAGNOSIS — C50911 Malignant neoplasm of unspecified site of right female breast: Secondary | ICD-10-CM

## 2011-10-03 DIAGNOSIS — C9111 Chronic lymphocytic leukemia of B-cell type in remission: Secondary | ICD-10-CM

## 2011-10-03 DIAGNOSIS — M25519 Pain in unspecified shoulder: Secondary | ICD-10-CM | POA: Insufficient documentation

## 2011-10-03 DIAGNOSIS — Z853 Personal history of malignant neoplasm of breast: Secondary | ICD-10-CM | POA: Insufficient documentation

## 2011-10-03 DIAGNOSIS — C50919 Malignant neoplasm of unspecified site of unspecified female breast: Secondary | ICD-10-CM

## 2011-10-03 DIAGNOSIS — C9110 Chronic lymphocytic leukemia of B-cell type not having achieved remission: Secondary | ICD-10-CM

## 2011-10-03 DIAGNOSIS — R232 Flushing: Secondary | ICD-10-CM

## 2011-10-03 LAB — CBC WITH DIFFERENTIAL/PLATELET
BASO%: 0.3 % (ref 0.0–2.0)
EOS%: 0.6 % (ref 0.0–7.0)
HCT: 41.8 % (ref 34.8–46.6)
LYMPH%: 86.9 % — ABNORMAL HIGH (ref 14.0–49.7)
MCH: 28.2 pg (ref 25.1–34.0)
MCHC: 31.9 g/dL (ref 31.5–36.0)
NEUT%: 9.8 % — ABNORMAL LOW (ref 38.4–76.8)
lymph#: 53.4 10*3/uL — ABNORMAL HIGH (ref 0.9–3.3)

## 2011-10-03 LAB — COMPREHENSIVE METABOLIC PANEL
AST: 16 U/L (ref 0–37)
Alkaline Phosphatase: 66 U/L (ref 39–117)
BUN: 19 mg/dL (ref 6–23)
Creatinine, Ser: 0.79 mg/dL (ref 0.50–1.10)
Potassium: 3.8 mEq/L (ref 3.5–5.3)
Total Bilirubin: 0.3 mg/dL (ref 0.3–1.2)

## 2011-10-03 MED ORDER — MEGESTROL ACETATE 40 MG PO TABS: 20.0000 mg | ORAL_TABLET | Freq: Two times a day (BID) | ORAL | Status: DC

## 2011-10-04 ENCOUNTER — Telehealth: Payer: Self-pay

## 2011-10-04 NOTE — Progress Notes (Signed)
Hematology and Oncology Follow Up Visit  [name] [id number] 12-24-1935 76 y.o. 10/04/2011 8:42 AM   Principle Diagnosis: Encounter Diagnoses  Name Primary?  . CLL (chronic lymphocytic leukemia)   . Breast cancer, stage 3, right Yes  . Pain in shoulder region, right      Interim History:   Followup visit for this pleasant 76 year old woman diagnosed with Stage III-A (T3 N1) one node positive, ER negative cancer of the right breast diagnosed May 1999, treated with mastectomy followed by 4 cycles of Adriamycin and Cytoxan chemotherapy, 2 cycles of Taxol and then chest wall radiation.   She also has a history of Chronic lymphocytic leukemia diagnosed January 1990.  Initial complete remission in response to chemotherapy given for breast cancer.  Slowly rising white blood cell count with stable hemoglobin and platelets and she has not required any additional chemotherapy for over 13 years.   Overall she is doing well. She did get a bad bronchitis which started on Christmas day and lasted for about 3 weeks. She was treated with outpatient antibiotics by her primary care physician. Only other complaint is proximal right arm and shoulder pain which has occurred over the last 4 months. She  reported this to her primary care. He is considering an orthopedic referral if her symptoms persist. Pain appears to be more in the muscle but probably also involves the shoulder joint. She tells me that she just started an exercise program with water aerobics and it may be she is straining some of these muscles. Of note she has a mastectomy on the right side.   Medications: reviewed  Allergies:  Allergies  Allergen Reactions  . Codeine Nausea And Vomiting    headache    Review of Systems: Constitutional:   No constitutional symptoms Respiratory: Bronchitis symptoms now resolved Cardiovascular:  No chest pain or palpitations Gastrointestinal: No change in bowel habit no hematochezia or  melena Genito-Urinary: No urinary tract symptoms. No vaginal bleeding. She is post hysterectomy with unilateral oophorectomy in 1972. Musculoskeletal: See above Neurologic: No headache or change in vision Skin: No rash or ecchymosis Remaining ROS negative.  Physical Exam: Blood pressure 142/95, pulse 84, temperature 97.3 F (36.3 C), temperature source Oral, height 5' 2.5" (1.588 m), weight 187 lb 11.2 oz (85.14 kg). Wt Readings from Last 3 Encounters:  10/03/11 187 lb 11.2 oz (85.14 kg)  01/16/08 181 lb (82.101 kg)  05/30/07 183 lb (83.008 kg)     General appearance: Well-nourished Caucasian woman HENNT: Pharynx no erythema or exudate Lymph nodes: No cervical supraclavicular or axillary adenopathy Breasts: Right mastectomy no chest wall lesions no left breast masses Lungs: Clear to auscultation resonant to percussion Heart: Regular rhythm Abdomen: Soft nontender no mass no organomegaly Extremities: No edema no calf tenderness Vascular: No cyanosis Neurologic: Mental status intact, cranial nerves intact, motor strength 5 over 5, reflexes 1+ symmetric Skin: No rash or ecchymosis Musculoskeletal: Minimal pain on palpation over the deltoid muscle on the right and pain on  inversion and eversion at the right shoulder joint  Lab Results: Lab Results  Component Value Date   WBC 61.5* 10/03/2011   HGB 13.3 10/03/2011   HCT 41.8 10/03/2011   MCV 88.3 10/03/2011   PLT 253 10/03/2011   white count differential 87% lymphocytes 10% neutrophils   Chemistry      Component Value Date/Time   NA 141 10/03/2011 1423   K 3.8 10/03/2011 1423   CL 110 10/03/2011 1423   CO2 20 10/03/2011  1423   BUN 19 10/03/2011 1423   CREATININE 0.79 10/03/2011 1423      Component Value Date/Time   CALCIUM 9.2 10/03/2011 1423   ALKPHOS 66 10/03/2011 1423   AST 16 10/03/2011 1423   ALT 12 10/03/2011 1423   BILITOT 0.3 10/03/2011 1423       Radiological Studies: X-rays of the right shoulder show no obvious bony  abnormalities    Impression and Plan: #1. Stage IIIA T3 N1 one node positive ER negative cancer of the right breast treated as outlined above she remains free of any obvious recurrence now out 14 years from diagnosis in May of 1999. Plan: Continue annual exams and mammograms. Next mammogram is due in September.  #2. Long-standing chronic lymphocytic leukemia. Trend as her white count to be slowly rising over time. However, hemoglobin and platelet count remain stable. No indication to treat at this time. Plan: Continue every 3 month laboratory checks and every 6 month clinical exams.  #3. Pain right shoulder and right deltoid muscle. Musculoskeletal versus a rotator cuff injury. Regular x-rays are unremarkable. If pain persists I would agree with an orthopedic surgery referral and an MRI scan.  #4. Benign adenomas of the colon  #6. GERD  #7. Chronic depression controlled on medication.  #8. Degenerative arthritis. Status post right hip replacement.   CC:. Dr Ninetta Lights Fraser Din, MD 5/14/20138:42 AM

## 2011-10-04 NOTE — Telephone Encounter (Signed)
Unable to reach pt with results due phone # "no longer in service."  dph

## 2011-10-04 NOTE — Telephone Encounter (Signed)
Message copied by Albertha Ghee on Tue Oct 04, 2011  5:04 PM ------      Message from: Levert Feinstein      Created: Mon Oct 03, 2011  6:26 PM       Call pt  Regular X-rays of shoulder are normal - if persistent pain - will need an MRI

## 2011-10-05 NOTE — Progress Notes (Signed)
Script to pt on 5/13 signed by Dr Cyndie Chime stating -   "breast prosthesis: s/p mastectomy for breast cancer."  dph

## 2011-10-06 ENCOUNTER — Other Ambulatory Visit: Payer: Self-pay | Admitting: *Deleted

## 2011-10-06 DIAGNOSIS — C50919 Malignant neoplasm of unspecified site of unspecified female breast: Secondary | ICD-10-CM

## 2011-10-06 DIAGNOSIS — C9110 Chronic lymphocytic leukemia of B-cell type not having achieved remission: Secondary | ICD-10-CM

## 2011-10-12 ENCOUNTER — Telehealth: Payer: Self-pay | Admitting: *Deleted

## 2011-10-12 NOTE — Telephone Encounter (Signed)
Received call from pt asking for results of x-ray done 1 wk ago.  Result given: normal per Dr Cyndie Chime.  She reports that she is still having pain 24/7.  Will discuss with Dr Cyndie Chime to see if he wants an MRI.  Phone # corrected with Deana in registration.  Home # is [phone].

## 2011-10-13 ENCOUNTER — Other Ambulatory Visit: Payer: Self-pay | Admitting: Oncology

## 2011-10-13 DIAGNOSIS — M25511 Pain in right shoulder: Secondary | ICD-10-CM

## 2011-10-13 DIAGNOSIS — C50919 Malignant neoplasm of unspecified site of unspecified female breast: Secondary | ICD-10-CM

## 2011-10-14 ENCOUNTER — Telehealth: Payer: Self-pay | Admitting: Oncology

## 2011-10-14 NOTE — Telephone Encounter (Signed)
Pt aware of MRI in May and Mammogram in Sept and lab  ML in November 2013

## 2011-10-18 ENCOUNTER — Inpatient Hospital Stay (HOSPITAL_COMMUNITY): Admission: RE | Admit: 2011-10-18 | Payer: Medicare Other | Source: Ambulatory Visit

## 2011-10-19 ENCOUNTER — Ambulatory Visit (HOSPITAL_COMMUNITY)
Admission: RE | Admit: 2011-10-19 | Discharge: 2011-10-19 | Disposition: A | Discharge status: Home / Self Care | Payer: Medicare Other | Source: Ambulatory Visit | Attending: Oncology | Admitting: Oncology

## 2011-10-19 DIAGNOSIS — R937 Abnormal findings on diagnostic imaging of other parts of musculoskeletal system: Secondary | ICD-10-CM | POA: Insufficient documentation

## 2011-10-19 DIAGNOSIS — M25619 Stiffness of unspecified shoulder, not elsewhere classified: Secondary | ICD-10-CM | POA: Insufficient documentation

## 2011-10-19 DIAGNOSIS — M25519 Pain in unspecified shoulder: Secondary | ICD-10-CM | POA: Insufficient documentation

## 2011-10-19 DIAGNOSIS — M25511 Pain in right shoulder: Secondary | ICD-10-CM

## 2011-10-19 DIAGNOSIS — M719 Bursopathy, unspecified: Secondary | ICD-10-CM | POA: Insufficient documentation

## 2011-10-19 DIAGNOSIS — M67919 Unspecified disorder of synovium and tendon, unspecified shoulder: Secondary | ICD-10-CM | POA: Insufficient documentation

## 2011-10-19 DIAGNOSIS — M19019 Primary osteoarthritis, unspecified shoulder: Secondary | ICD-10-CM | POA: Insufficient documentation

## 2011-10-19 DIAGNOSIS — C50919 Malignant neoplasm of unspecified site of unspecified female breast: Secondary | ICD-10-CM | POA: Insufficient documentation

## 2011-10-20 ENCOUNTER — Telehealth: Payer: Self-pay

## 2011-10-20 DIAGNOSIS — M25511 Pain in right shoulder: Secondary | ICD-10-CM

## 2011-10-20 NOTE — Telephone Encounter (Signed)
Message copied by Albertha Ghee on Thu Oct 20, 2011  1:32 PM ------      Message from: Levert Feinstein      Created: Wed Oct 19, 2011  7:52 PM       Call patient: tendons in right shoulder joint are inflamed.  Rec orthopedic surgery referral - would likely benefit from steroid injection - please route copy of MRI to her primary care MD & her orthopedist of choice.  If she doesn't know anyone, refer to Dr Regenia Skeeter group

## 2011-10-20 NOTE — Telephone Encounter (Signed)
Results to pt per Dr Patsy Lager note.   Pt does not have an orthopod, and would like a referral.    Note to scheduling for referral.  MRI routed. dph

## 2011-10-25 ENCOUNTER — Telehealth: Payer: Self-pay | Admitting: Oncology

## 2011-10-25 NOTE — Telephone Encounter (Signed)
Called [facility] to inquire why patient has not gotten an appt, since pt was referred a week ago,I was informed that pt was supposed to call them, I ask them if patient is aware that she was supposed to call, she cannot give me an answer. Talked to pt, asked her to call [facility], pt will call them now. Still waiting for an appt from [facility]

## 2011-10-26 ENCOUNTER — Telehealth: Payer: Self-pay | Admitting: Oncology

## 2011-10-26 NOTE — Telephone Encounter (Signed)
Talked to pt, regarding orthopedic consult and she will be calling Dr. Hoy Register office again, the woman she is supposed to talk to wont be back until the middle of month, for the mean time she will be going to [place] to get a shot.

## 2011-11-15 ENCOUNTER — Encounter: Payer: Self-pay | Admitting: Orthopedic Surgery

## 2011-11-15 ENCOUNTER — Ambulatory Visit (INDEPENDENT_AMBULATORY_CARE_PROVIDER_SITE_OTHER): Payer: Medicare Other | Admitting: Orthopedic Surgery

## 2011-11-15 VITALS — BP 110/78 | Ht 62.5 in | Wt 191.0 lb

## 2011-11-15 DIAGNOSIS — R2230 Localized swelling, mass and lump, unspecified upper limb: Secondary | ICD-10-CM | POA: Insufficient documentation

## 2011-11-15 DIAGNOSIS — R229 Localized swelling, mass and lump, unspecified: Secondary | ICD-10-CM

## 2011-11-15 NOTE — Patient Instructions (Addendum)
We will call you with your MRI appointment 

## 2011-11-15 NOTE — Progress Notes (Signed)
  Subjective:    Patient ID: [name], female    DOB: 01/08/1936, 76 y.o.   MRN: [id number] Chief Complaint  Patient presents with  . Arm Pain    right upper arm pain from neck to elbow x 2 months, sudden onset, no known injury    Arm Pain   Referral from the [facility] oncology department.  Patient presents as a 76 her female with a history of mastectomy in the RIGHT upper extremity with RIGHT shoulder and arm pain, which is really localized to the biceps tendon. She had an MRI of the shoulder, which shows a cystic mass in the bicipital groove and tracking, but the MRI does not completely evaluate the lesion. She does have some pain with forward elevation, but most of her symptoms are in her biceps tendon.  She has been symptomatic for  She describes the pain as  Associated symptoms include    Review of Systems  Constitutional: Positive for fatigue.  Gastrointestinal: Negative.   Neurological: Positive for dizziness.       Objective:   Physical Exam  Constitutional: She is oriented to person, place, and time. She appears well-developed and well-nourished.  Neck: Neck supple. No tracheal deviation present. No thyromegaly present.  Cardiovascular: Intact distal pulses.   Musculoskeletal:       Right shoulder: She exhibits decreased range of motion, tenderness, swelling, crepitus, pain and decreased strength. She exhibits no bony tenderness, no effusion, no deformity, no laceration, no spasm and normal pulse.       Left shoulder: Normal.       Primarily has tenderness over the biceps tendon. Some pain with forward flexion of the elbow. No pain with extension. There appears to be a mass palpable in the proximal and the substance of the biceps tendon. The shoulder joint. Exam shows no tenderness, but there is some mild impingement. However, this does not seem to be the primary area of concern for her at this time.  Neurological: She is alert and oriented to person, place, and  time. She has normal reflexes.  Skin: Skin is warm.  Psychiatric: She has a normal mood and affect. Her behavior is normal. Judgment and thought content normal.   Lower extremity exam  Ambulation is normal.  Inspection and palpation revealed no  abnormality in alignment in the lower extremities. Range of motion is full.  Strength is grade 5.  all joints are stable.          Assessment & Plan:  Evaluation of the MRI shows that there is minimal cuff disease. There is some cyst formation. The biceps tendon and this needs to be further evaluated as this is primarily a shoulder MRI and the lesion is in the arm or humeral area.  Recommend MRI of the humerus and reevaluation after study has been completed

## 2011-12-01 ENCOUNTER — Telehealth: Payer: Self-pay | Admitting: Radiology

## 2011-12-01 NOTE — Telephone Encounter (Signed)
Patient's MRI of her humerus for mass was denied. Patient wants to know what you are going to do to treat her arm now? I told her she needed to call her insurance company that she could appeal it, but she is asking you about further treatment?

## 2011-12-01 NOTE — Telephone Encounter (Signed)
i cant manage the lesion without the mri   Good advice -call the insurer

## 2011-12-26 ENCOUNTER — Telehealth: Payer: Self-pay | Admitting: Oncology

## 2011-12-26 NOTE — Telephone Encounter (Signed)
Reschedule appt for 8/14 from 8/12 per pt rqst

## 2012-01-02 ENCOUNTER — Other Ambulatory Visit: Payer: Medicare Other | Admitting: Lab

## 2012-01-04 ENCOUNTER — Other Ambulatory Visit (HOSPITAL_BASED_OUTPATIENT_CLINIC_OR_DEPARTMENT_OTHER): Payer: Medicare Other | Admitting: Lab

## 2012-01-04 DIAGNOSIS — Z853 Personal history of malignant neoplasm of breast: Secondary | ICD-10-CM

## 2012-01-04 DIAGNOSIS — C9110 Chronic lymphocytic leukemia of B-cell type not having achieved remission: Secondary | ICD-10-CM

## 2012-01-04 DIAGNOSIS — C50919 Malignant neoplasm of unspecified site of unspecified female breast: Secondary | ICD-10-CM

## 2012-01-04 DIAGNOSIS — C9111 Chronic lymphocytic leukemia of B-cell type in remission: Secondary | ICD-10-CM

## 2012-01-04 LAB — CBC WITH DIFFERENTIAL/PLATELET
Basophils Absolute: 0.1 10*3/uL (ref 0.0–0.1)
Eosinophils Absolute: 0.4 10*3/uL (ref 0.0–0.5)
HCT: 40.5 % (ref 34.8–46.6)
HGB: 13.5 g/dL (ref 11.6–15.9)
LYMPH%: 90.4 % — ABNORMAL HIGH (ref 14.0–49.7)
MCV: 86.5 fL (ref 79.5–101.0)
MONO%: 0.1 % (ref 0.0–14.0)
NEUT#: 4.1 10*3/uL (ref 1.5–6.5)
Platelets: 221 10*3/uL (ref 145–400)

## 2012-01-04 LAB — MORPHOLOGY: PLT EST: ADEQUATE

## 2012-01-05 ENCOUNTER — Telehealth: Payer: Self-pay | Admitting: *Deleted

## 2012-01-05 NOTE — Telephone Encounter (Signed)
Pt called back & message given re: WBC count per Dr. Cyndie Chime.  She was very happy.

## 2012-01-05 NOTE — Telephone Encounter (Signed)
Message copied by Sabino Snipes on Thu Jan 05, 2012 11:44 AM ------      Message from: Levert Feinstein      Created: Wed Jan 04, 2012  2:33 PM       Call pt  WBC stable - lower than last time

## 2012-02-20 ENCOUNTER — Ambulatory Visit (HOSPITAL_COMMUNITY)
Admission: RE | Admit: 2012-02-20 | Discharge: 2012-02-20 | Disposition: A | Discharge status: Home / Self Care | Payer: Medicare Other | Source: Ambulatory Visit | Attending: Oncology | Admitting: Oncology

## 2012-02-20 DIAGNOSIS — Z1231 Encounter for screening mammogram for malignant neoplasm of breast: Secondary | ICD-10-CM | POA: Insufficient documentation

## 2012-02-20 DIAGNOSIS — C50911 Malignant neoplasm of unspecified site of right female breast: Secondary | ICD-10-CM

## 2012-03-02 ENCOUNTER — Other Ambulatory Visit: Payer: Self-pay | Admitting: Oncology

## 2012-03-02 DIAGNOSIS — R928 Other abnormal and inconclusive findings on diagnostic imaging of breast: Secondary | ICD-10-CM

## 2012-03-14 ENCOUNTER — Ambulatory Visit (HOSPITAL_COMMUNITY)
Admission: RE | Admit: 2012-03-14 | Discharge: 2012-03-14 | Disposition: A | Discharge status: Home / Self Care | Payer: Medicare Other | Source: Ambulatory Visit | Attending: Oncology | Admitting: Oncology

## 2012-03-14 DIAGNOSIS — N63 Unspecified lump in unspecified breast: Secondary | ICD-10-CM | POA: Insufficient documentation

## 2012-03-14 DIAGNOSIS — R928 Other abnormal and inconclusive findings on diagnostic imaging of breast: Secondary | ICD-10-CM

## 2012-03-26 ENCOUNTER — Telehealth: Payer: Self-pay | Admitting: Oncology

## 2012-03-26 ENCOUNTER — Ambulatory Visit (HOSPITAL_BASED_OUTPATIENT_CLINIC_OR_DEPARTMENT_OTHER): Payer: Medicare Other | Admitting: Nurse Practitioner

## 2012-03-26 ENCOUNTER — Other Ambulatory Visit (HOSPITAL_BASED_OUTPATIENT_CLINIC_OR_DEPARTMENT_OTHER): Payer: Medicare Other | Admitting: Lab

## 2012-03-26 VITALS — BP 127/81 | HR 91 | Temp 98.2°F | Resp 20 | Ht 62.5 in | Wt 188.7 lb

## 2012-03-26 DIAGNOSIS — C9110 Chronic lymphocytic leukemia of B-cell type not having achieved remission: Secondary | ICD-10-CM

## 2012-03-26 DIAGNOSIS — Z853 Personal history of malignant neoplasm of breast: Secondary | ICD-10-CM

## 2012-03-26 DIAGNOSIS — Z23 Encounter for immunization: Secondary | ICD-10-CM

## 2012-03-26 DIAGNOSIS — C50911 Malignant neoplasm of unspecified site of right female breast: Secondary | ICD-10-CM

## 2012-03-26 DIAGNOSIS — C9111 Chronic lymphocytic leukemia of B-cell type in remission: Secondary | ICD-10-CM

## 2012-03-26 DIAGNOSIS — F329 Major depressive disorder, single episode, unspecified: Secondary | ICD-10-CM

## 2012-03-26 DIAGNOSIS — C50919 Malignant neoplasm of unspecified site of unspecified female breast: Secondary | ICD-10-CM

## 2012-03-26 LAB — LACTATE DEHYDROGENASE (CC13): LDH: 191 U/L (ref 125–220)

## 2012-03-26 LAB — MORPHOLOGY

## 2012-03-26 LAB — CBC WITH DIFFERENTIAL/PLATELET
BASO%: 0.2 % (ref 0.0–2.0)
Basophils Absolute: 0.1 10*3/uL (ref 0.0–0.1)
HCT: 41.9 % (ref 34.8–46.6)
HGB: 13.6 g/dL (ref 11.6–15.9)
MCHC: 32.5 g/dL (ref 31.5–36.0)
MONO#: 1.7 10*3/uL — ABNORMAL HIGH (ref 0.1–0.9)
NEUT%: 10.9 % — ABNORMAL LOW (ref 38.4–76.8)
RDW: 15.1 % — ABNORMAL HIGH (ref 11.2–14.5)
WBC: 59.2 10*3/uL (ref 3.9–10.3)
lymph#: 50.7 10*3/uL — ABNORMAL HIGH (ref 0.9–3.3)

## 2012-03-26 LAB — COMPREHENSIVE METABOLIC PANEL (CC13)
Alkaline Phosphatase: 64 U/L (ref 40–150)
BUN: 17 mg/dL (ref 7.0–26.0)
Creatinine: 0.8 mg/dL (ref 0.6–1.1)
Glucose: 106 mg/dl — ABNORMAL HIGH (ref 70–99)
Sodium: 142 mEq/L (ref 136–145)
Total Bilirubin: 0.42 mg/dL (ref 0.20–1.20)
Total Protein: 6.4 g/dL (ref 6.4–8.3)

## 2012-03-26 MED ORDER — INFLUENZA VIRUS VACC SPLIT PF IM SUSP
0.5000 mL | Freq: Once | INTRAMUSCULAR | Status: AC
  Administered 2012-03-26: 0.5 mL via INTRAMUSCULAR
  Filled 2012-03-26: qty 0.5

## 2012-03-26 NOTE — Progress Notes (Signed)
OFFICE PROGRESS NOTE  Interval history:  [name] is a 76 year old woman diagnosed with stage IIIa (T3 N1) 1 node positive ER negative cancer of the right breast in May 1999 treated with mastectomy followed by 4 cycles of Adriamycin and Cytoxan chemotherapy, 2 cycles of Taxol and then chest wall radiation. She also has a history of chronic lymphocytic leukemia diagnosed January 1990. She obtained an initial complete remission in response to chemotherapy given for breast cancer. She has a slowly rising white blood cell count with stable hemoglobin and platelets. She has not required any additional chemotherapy.  Left mammogram on 02/21/2012 recommended further evaluation for a possible mass in the left breast. Followup diagnostic left mammogram on 03/14/2012 showed no evidence for malignancy in the left breast. The area in question was not reproduced on additional imaging. No worrisome finding was seen.   [name] reports that she feels well. No interim illnesses or infections. She has a good appetite. No fever. She has periodic night sweats. She has occasional pain at the right chest wall. The pain has been occurring intermittently since the mastectomy. She has not noticed any enlarged lymph nodes. Bowels moving regularly. No hematuria or dysuria.   Objective: Blood pressure 127/81, pulse 91, temperature 98.2 F (36.8 C), temperature source Oral, resp. rate 20, height 5' 2.5" (1.588 m), weight 188 lb 11.2 oz (85.594 kg).  Oropharynx is without thrush or ulceration. No palpable cervical, supraclavicular, axillary or inguinal lymph nodes. Lungs are clear. No wheezes or rales. Status post right mastectomy. No evidence of chest wall recurrence. No mass palpated in the left breast. Regular cardiac rhythm. Abdomen soft and nontender. No organomegaly. Extremities are without edema. Calves are soft and nontender. Motor strength 5 over 5.  Lab Results: Lab Results  Component Value Date   WBC  59.2* 03/26/2012   HGB 13.6 03/26/2012   HCT 41.9 03/26/2012   MCV 88.6 03/26/2012   PLT 255 03/26/2012    Chemistry:    Chemistry      Component Value Date/Time   NA 142 03/26/2012 1308   NA 141 10/03/2011 1423   K 3.7 03/26/2012 1308   K 3.8 10/03/2011 1423   CL 112* 03/26/2012 1308   CL 110 10/03/2011 1423   CO2 20* 03/26/2012 1308   CO2 20 10/03/2011 1423   BUN 17.0 03/26/2012 1308   BUN 19 10/03/2011 1423   CREATININE 0.8 03/26/2012 1308   CREATININE 0.79 10/03/2011 1423      Component Value Date/Time   CALCIUM 9.7 03/26/2012 1308   CALCIUM 9.2 10/03/2011 1423   ALKPHOS 64 03/26/2012 1308   ALKPHOS 66 10/03/2011 1423   AST 16 03/26/2012 1308   AST 16 10/03/2011 1423   ALT 16 03/26/2012 1308   ALT 12 10/03/2011 1423   BILITOT 0.42 03/26/2012 1308   BILITOT 0.3 10/03/2011 1423       Studies/Results: Mm Digital Diagnostic Unilat L  03/14/2012  *RADIOLOGY REPORT*  Clinical Data:  Screening callback for questioned left breast mass  DIGITAL DIAGNOSTIC LEFT MAMMOGRAM WITH CAD  Comparison:  Prior studies  Findings:  The area in question in the left breast is not reproduced on additional imaging.  The area changes conformation and demonstrates pliability, compatible with normal-appearing fibroglandular tissue. No worrisome finding is seen.  Mammographic images were processed with CAD.  IMPRESSION: No evidence for malignancy in the left breast. Screening mammography is recommended in one year. Findings and recommendations discussed with the patient and provided in written  form at the time of the exam.  BI-RADS CATEGORY 1:  Negative.   Original Report Authenticated By: Harrel Lemon, M.D.     Medications: I have reviewed the patient's current medications.  Assessment/Plan:  1.  Stage IIIa (T3 N1) 1 node positive ER negative cancer of the right breast treated with mastectomy followed by 4 cycles of Adriamycin and Cytoxan chemotherapy, 2 cycles of Taxol and then chest wall  radiation. 2. Long-standing CLL. White count is elevated but stable. Hemoglobin and platelet count remain in normal range. 3. Benign adenomas of the colon. 4. GERD. 5. Chronic depression. 6. Degenerative arthritis. Status post right hip replacement.  Disposition-[name] appears stable. She will return for labs in 3 months and an office visit in 6 months. She will contact the office in the interim with any problems. We administered the influenza vaccine today.   Lonna Cobb ANP/GNP-BC

## 2012-03-26 NOTE — Telephone Encounter (Signed)
appts made and printed for pt aom °

## 2012-06-22 ENCOUNTER — Telehealth: Payer: Self-pay | Admitting: Oncology

## 2012-06-22 NOTE — Telephone Encounter (Signed)
Pt called and wants lab be drawn @ [facility], called them talked to [facility], she will call patient to schedule labs for 06/26/12, notified nurse

## 2012-06-26 ENCOUNTER — Other Ambulatory Visit: Payer: Medicare Other

## 2012-06-26 ENCOUNTER — Encounter (HOSPITAL_COMMUNITY): Payer: Medicare Other | Attending: Oncology

## 2012-06-26 DIAGNOSIS — C50919 Malignant neoplasm of unspecified site of unspecified female breast: Secondary | ICD-10-CM | POA: Insufficient documentation

## 2012-06-26 DIAGNOSIS — C9110 Chronic lymphocytic leukemia of B-cell type not having achieved remission: Secondary | ICD-10-CM | POA: Insufficient documentation

## 2012-06-26 LAB — CBC WITH DIFFERENTIAL/PLATELET
Basophils Relative: 0 % (ref 0–1)
HCT: 42.6 % (ref 36.0–46.0)
Hemoglobin: 14 g/dL (ref 12.0–15.0)
Lymphocytes Relative: 89 % — ABNORMAL HIGH (ref 12–46)
Lymphs Abs: 57.1 10*3/uL — ABNORMAL HIGH (ref 0.7–4.0)
MCV: 89.5 fL (ref 78.0–100.0)
Monocytes Relative: 2 % — ABNORMAL LOW (ref 3–12)
Neutro Abs: 5.8 10*3/uL (ref 1.7–7.7)
WBC: 64.2 10*3/uL (ref 4.0–10.5)

## 2012-06-26 NOTE — Progress Notes (Signed)
[name] presented for labwork. Labs per MD order drawn via Peripheral Line 25 gauge needle inserted in rt hand.  Good blood return present. Procedure without incident.  Needle removed intact. Patient tolerated procedure well.

## 2012-07-02 ENCOUNTER — Telehealth: Payer: Self-pay | Admitting: *Deleted

## 2012-07-02 NOTE — Telephone Encounter (Signed)
Spoke with pt; per Dr. Cyndie Chime blood counts stable- no need to treat.  Pt verbalized understanding and confirmed appt for 09/25/2012.

## 2012-07-02 NOTE — Telephone Encounter (Signed)
Message copied by Gala Romney on Mon Jul 02, 2012  4:27 PM ------      Message from: Levert Feinstein      Created: Tue Jun 26, 2012  7:47 PM       Call pt - blood counts stable - no need to treat at this time ------

## 2012-09-05 ENCOUNTER — Ambulatory Visit (HOSPITAL_COMMUNITY)
Admission: RE | Admit: 2012-09-05 | Discharge: 2012-09-05 | Disposition: A | Discharge status: Home / Self Care | Payer: Medicare Other | Source: Ambulatory Visit | Attending: Family Medicine | Admitting: Family Medicine

## 2012-09-05 ENCOUNTER — Other Ambulatory Visit (HOSPITAL_COMMUNITY): Payer: Self-pay | Admitting: Family Medicine

## 2012-09-05 DIAGNOSIS — R0602 Shortness of breath: Secondary | ICD-10-CM

## 2012-09-05 LAB — BLOOD GAS, ARTERIAL
Acid-base deficit: 3.5 mmol/L — ABNORMAL HIGH (ref 0.0–2.0)
Bicarbonate: 19.8 mEq/L — ABNORMAL LOW (ref 20.0–24.0)
Patient temperature: 37
TCO2: 17.3 mmol/L (ref 0–100)
pH, Arterial: 7.453 — ABNORMAL HIGH (ref 7.350–7.450)

## 2012-09-18 ENCOUNTER — Other Ambulatory Visit (HOSPITAL_COMMUNITY): Payer: Self-pay

## 2012-09-18 DIAGNOSIS — R0902 Hypoxemia: Secondary | ICD-10-CM

## 2012-09-21 ENCOUNTER — Inpatient Hospital Stay (HOSPITAL_COMMUNITY): Admission: RE | Admit: 2012-09-21 | Payer: Medicare Other | Source: Ambulatory Visit

## 2012-09-25 ENCOUNTER — Other Ambulatory Visit: Payer: Medicare Other | Admitting: Lab

## 2012-09-25 ENCOUNTER — Telehealth: Payer: Self-pay | Admitting: *Deleted

## 2012-09-25 ENCOUNTER — Ambulatory Visit: Payer: Medicare Other | Admitting: Oncology

## 2012-09-25 NOTE — Telephone Encounter (Signed)
Called pt to f/u on missed appt.  She reports that she called this am to cancel & left message on scheduler's phone that she wasn't feeling well & had some bad news about her brother & she couldn't sleep last night.  She also states that she has some papers from Dr Sudie Bailey that he wanted Dr Cyndie Chime to see & she will mail them tomorrow.  She reports that her WBC was up some.

## 2012-09-28 ENCOUNTER — Ambulatory Visit (HOSPITAL_COMMUNITY)
Admission: RE | Admit: 2012-09-28 | Discharge: 2012-09-28 | Disposition: A | Discharge status: Home / Self Care | Payer: Medicare Other | Source: Ambulatory Visit | Attending: Family Medicine | Admitting: Family Medicine

## 2012-09-28 DIAGNOSIS — R0902 Hypoxemia: Secondary | ICD-10-CM | POA: Insufficient documentation

## 2012-10-02 ENCOUNTER — Other Ambulatory Visit: Payer: Self-pay | Admitting: Oncology

## 2012-10-02 DIAGNOSIS — C9110 Chronic lymphocytic leukemia of B-cell type not having achieved remission: Secondary | ICD-10-CM

## 2012-10-02 DIAGNOSIS — C50912 Malignant neoplasm of unspecified site of left female breast: Secondary | ICD-10-CM

## 2012-10-03 ENCOUNTER — Telehealth: Payer: Self-pay | Admitting: Oncology

## 2012-10-03 NOTE — Procedures (Signed)
NAMEZUZANNA, MARONEY             ACCOUNT NO.:  [id number]  MEDICAL RECORD NO.:  [id number]  LOCATION:                                 FACILITY:  PHYSICIAN:  Cathi Hazan L. Juanetta Gosling, M.D.DATE OF BIRTH:  1936/05/01  DATE OF PROCEDURE:  10/02/2012 DATE OF DISCHARGE:                           PULMONARY FUNCTION TEST   Reason for pulmonary function testing is oxygen deficiency. 1. Spirometry shows no ventilatory defect and no airflow obstruction. 2. Lung volumes show mild reduction in total lung capacity suggesting     possible restrictive change. 3. DLCO is moderately reduced. 4. Airway resistance is normal. 5. There is mild reduction in total lung capacity, which noting the     patient's height and weight, it maybe related to body habitus.  No     definite cause of oxygen deficiency is seen on this study.     Malia Corsi L. Juanetta Gosling, M.D.     ELH/MEDQ  D:  10/02/2012  T:  10/03/2012  Job:  [id number]  cc:   Mila Homer. Sudie Bailey, M.D. Fax: [phone]

## 2012-10-03 NOTE — Telephone Encounter (Signed)
S.W. PT AND ADVISED ON 5.23.14...Marland KitchenPT OK AND AWARE

## 2012-10-08 ENCOUNTER — Other Ambulatory Visit: Payer: Self-pay | Admitting: *Deleted

## 2012-10-08 DIAGNOSIS — C50919 Malignant neoplasm of unspecified site of unspecified female breast: Secondary | ICD-10-CM

## 2012-10-08 MED ORDER — MEGESTROL ACETATE 40 MG PO TABS: 40.0000 mg | ORAL_TABLET | Freq: Every day | ORAL | Status: DC

## 2012-10-11 LAB — PULMONARY FUNCTION TEST

## 2012-10-12 ENCOUNTER — Ambulatory Visit (HOSPITAL_BASED_OUTPATIENT_CLINIC_OR_DEPARTMENT_OTHER): Payer: Medicare Other | Admitting: Nurse Practitioner

## 2012-10-12 ENCOUNTER — Other Ambulatory Visit (HOSPITAL_BASED_OUTPATIENT_CLINIC_OR_DEPARTMENT_OTHER): Payer: Medicare Other | Admitting: Lab

## 2012-10-12 ENCOUNTER — Telehealth: Payer: Self-pay | Admitting: Oncology

## 2012-10-12 VITALS — BP 128/93 | HR 81 | Temp 98.3°F | Resp 19 | Ht 62.0 in | Wt 191.8 lb

## 2012-10-12 DIAGNOSIS — C50911 Malignant neoplasm of unspecified site of right female breast: Secondary | ICD-10-CM

## 2012-10-12 DIAGNOSIS — C9110 Chronic lymphocytic leukemia of B-cell type not having achieved remission: Secondary | ICD-10-CM

## 2012-10-12 DIAGNOSIS — C50919 Malignant neoplasm of unspecified site of unspecified female breast: Secondary | ICD-10-CM

## 2012-10-12 DIAGNOSIS — C50912 Malignant neoplasm of unspecified site of left female breast: Secondary | ICD-10-CM

## 2012-10-12 LAB — CBC WITH DIFFERENTIAL/PLATELET
EOS%: 0.5 % (ref 0.0–7.0)
Eosinophils Absolute: 0.4 10*3/uL (ref 0.0–0.5)
MCH: 28.1 pg (ref 25.1–34.0)
MCV: 88.9 fL (ref 79.5–101.0)
MONO%: 1.9 % (ref 0.0–14.0)
NEUT#: 5.3 10*3/uL (ref 1.5–6.5)
RBC: 4.81 10*6/uL (ref 3.70–5.45)
RDW: 14.4 % (ref 11.2–14.5)

## 2012-10-12 LAB — COMPREHENSIVE METABOLIC PANEL (CC13)
Albumin: 3.7 g/dL (ref 3.5–5.0)
Alkaline Phosphatase: 67 U/L (ref 40–150)
Glucose: 104 mg/dl — ABNORMAL HIGH (ref 70–99)
Potassium: 4 mEq/L (ref 3.5–5.1)
Sodium: 142 mEq/L (ref 136–145)
Total Protein: 6.6 g/dL (ref 6.4–8.3)

## 2012-10-12 NOTE — Progress Notes (Signed)
OFFICE PROGRESS NOTE  Interval history:  [name] returns after missing a visit a few weeks ago due to a bout of "vertigo". To review, she is a 77 year old woman diagnosed with stage IIIa (T3 N1) 1 node positive ER negative cancer of the right breast in May 1999 treated with mastectomy followed by 4 cycles of Adriamycin and Cytoxan chemotherapy, 2 cycles of Taxol and then chest wall radiation. She also has a history of chronic lymphocytic leukemia diagnosed January 1990. She obtained an initial complete remission in response to chemotherapy given for breast cancer. She has a slowly rising white blood cell count with stable hemoglobin and platelets. She has not required any additional chemotherapy.  Left mammogram on 02/21/2012 recommended further evaluation for a possible mass in the left breast. Followup diagnostic left mammogram on 03/14/2012 showed no evidence for malignancy in the left breast. The area in question was not reproduced on additional imaging. No worrisome finding was seen.  She has had intermittent episodes of vertigo for many years. The dizziness is improving. She is taking meclizine. She denies any unusual headaches or vision change. She has noted improvement in fatigue since beginning CPAP. No interim infections. No fevers. She has periodic "hot flashes". She takes Megace. She has a good appetite. She continues to have occasional pain at the right chest wall. One month ago she noted a burning sensation over the right chest wall lasting for approximately 48 hours. No skin rash. She has not noticed any enlarged lymph nodes. Occasional cough. No shortness of breath.   Objective: Blood pressure 128/93, pulse 81, temperature 98.3 F (36.8 C), temperature source Oral, resp. rate 19, height 5\' 2"  (1.575 m), weight 191 lb 12.8 oz (87 kg).  Oropharynx is without thrush or ulceration. No palpable cervical, supraclavicular, axillary or inguinal lymph nodes. Faint inspiratory rales at both  lung bases. Status post right mastectomy. No evidence of chest wall recurrence. No mass palpated in the left breast. Regular cardiac rhythm. Abdomen is soft and nontender. No organomegaly. Extremities are without edema. Motor strength 5 over 5. Finger to nose and rapid alternating movement intact.  Lab Results: Lab Results  Component Value Date   WBC 65.2* 10/12/2012   HGB 13.5 10/12/2012   HCT 42.7 10/12/2012   MCV 88.9 10/12/2012   PLT 241 10/12/2012    Chemistry:    Chemistry      Component Value Date/Time   NA 142 10/12/2012 1038   NA 141 10/03/2011 1423   K 4.0 10/12/2012 1038   K 3.8 10/03/2011 1423   CL 111* 10/12/2012 1038   CL 110 10/03/2011 1423   CO2 19* 10/12/2012 1038   CO2 20 10/03/2011 1423   BUN 16.1 10/12/2012 1038   BUN 19 10/03/2011 1423   CREATININE 0.9 10/12/2012 1038   CREATININE 0.79 10/03/2011 1423      Component Value Date/Time   CALCIUM 9.0 10/12/2012 1038   CALCIUM 9.2 10/03/2011 1423   ALKPHOS 67 10/12/2012 1038   ALKPHOS 66 10/03/2011 1423   AST 20 10/12/2012 1038   AST 16 10/03/2011 1423   ALT 20 10/12/2012 1038   ALT 12 10/03/2011 1423   BILITOT 0.57 10/12/2012 1038   BILITOT 0.3 10/03/2011 1423       Studies/Results: No results found.  Medications: I have reviewed the patient's current medications.  Assessment/Plan:  1. Stage IIIa (T3 N1) 1 node positive ER negative cancer of the right breast treated with mastectomy followed by 4 cycles of Adriamycin and  Cytoxan chemotherapy, 2 cycles of Taxol and then chest wall radiation.  2. Long-standing CLL. White count is elevated but stable. Hemoglobin and platelet count remain in normal range.  3. Benign adenomas of the colon.  4. GERD.  5. Chronic depression.  6. Degenerative arthritis. Status post right hip replacement. 7. Vertigo.  Disposition-Ms. Birnbaum appears stable. She is due for an annual mammogram in October. We will request an interim CBC with differential to be done at Dr. Michelle Nasuti office in 3  months. She will return for a followup visit here in 6 months. She will contact the office in the interim with any problems.  Patient seen with Dr. Cyndie Chime.   [name] ANP/GNP-BC   CC Dr. John Giovanni

## 2012-10-12 NOTE — Telephone Encounter (Signed)
Gave pt appt for lab and MD on November 24th  and pt wants mammogram to be done @ [facility] on October 2014

## 2012-12-10 ENCOUNTER — Other Ambulatory Visit: Payer: Self-pay | Admitting: *Deleted

## 2012-12-10 DIAGNOSIS — C50919 Malignant neoplasm of unspecified site of unspecified female breast: Secondary | ICD-10-CM

## 2012-12-10 MED ORDER — MEGESTROL ACETATE 40 MG PO TABS: 40.0000 mg | ORAL_TABLET | Freq: Every day | ORAL | Status: DC

## 2012-12-27 ENCOUNTER — Other Ambulatory Visit: Payer: Self-pay | Admitting: *Deleted

## 2012-12-27 DIAGNOSIS — C9110 Chronic lymphocytic leukemia of B-cell type not having achieved remission: Secondary | ICD-10-CM

## 2012-12-28 ENCOUNTER — Encounter (HOSPITAL_COMMUNITY): Payer: Medicare Other | Attending: Internal Medicine

## 2012-12-28 ENCOUNTER — Telehealth (HOSPITAL_COMMUNITY): Payer: Self-pay | Admitting: *Deleted

## 2012-12-28 ENCOUNTER — Telehealth: Payer: Self-pay | Admitting: *Deleted

## 2012-12-28 DIAGNOSIS — C9111 Chronic lymphocytic leukemia of B-cell type in remission: Secondary | ICD-10-CM

## 2012-12-28 DIAGNOSIS — C9110 Chronic lymphocytic leukemia of B-cell type not having achieved remission: Secondary | ICD-10-CM | POA: Insufficient documentation

## 2012-12-28 LAB — CBC WITH DIFFERENTIAL/PLATELET
Eosinophils Absolute: 0.3 10*3/uL (ref 0.0–0.7)
Eosinophils Relative: 1 % (ref 0–5)
HCT: 44 % (ref 36.0–46.0)
Hemoglobin: 13.9 g/dL (ref 12.0–15.0)
Lymphs Abs: 63.3 10*3/uL — ABNORMAL HIGH (ref 0.7–4.0)
MCH: 28.5 pg (ref 26.0–34.0)
MCV: 90.3 fL (ref 78.0–100.0)
Monocytes Absolute: 0.9 10*3/uL (ref 0.1–1.0)
Monocytes Relative: 1 % — ABNORMAL LOW (ref 3–12)
Platelets: 260 10*3/uL (ref 150–400)
RBC: 4.87 MIL/uL (ref 3.87–5.11)

## 2012-12-28 NOTE — Telephone Encounter (Signed)
Message copied by Gala Romney on Fri Dec 28, 2012  3:03 PM ------      Message from: Levert Feinstein      Created: Fri Dec 28, 2012  2:09 PM       Call pt  CBC stable c/w previous ------

## 2012-12-28 NOTE — Progress Notes (Signed)
Labs drawn today for cbc/diff 

## 2012-12-28 NOTE — Telephone Encounter (Signed)
.  CRITICAL VALUE ALERT Critical value received:  WBC= 69.9 Date of notification:  12/28/2012 Time of notification: 1153 Critical value read back:  yes Nurse who received alert:  TAR MD notified (1st page):  [place], [place]

## 2012-12-28 NOTE — Telephone Encounter (Signed)
Per Dr. Cyndie Chime, notified pt CBC stable c/w previous.  Pt verbalized understanding and confirmed appt for 04/15/13.

## 2013-03-20 ENCOUNTER — Ambulatory Visit (HOSPITAL_COMMUNITY): Payer: Medicare Other

## 2013-03-21 ENCOUNTER — Ambulatory Visit (HOSPITAL_COMMUNITY)
Admission: RE | Admit: 2013-03-21 | Discharge: 2013-03-21 | Disposition: A | Discharge status: Home / Self Care | Payer: Medicare Other | Source: Ambulatory Visit | Attending: Nurse Practitioner | Admitting: Nurse Practitioner

## 2013-03-21 DIAGNOSIS — C9110 Chronic lymphocytic leukemia of B-cell type not having achieved remission: Secondary | ICD-10-CM

## 2013-03-21 DIAGNOSIS — Z1231 Encounter for screening mammogram for malignant neoplasm of breast: Secondary | ICD-10-CM | POA: Insufficient documentation

## 2013-03-21 DIAGNOSIS — C50911 Malignant neoplasm of unspecified site of right female breast: Secondary | ICD-10-CM

## 2013-04-12 ENCOUNTER — Other Ambulatory Visit: Payer: Medicare Other

## 2013-04-15 ENCOUNTER — Other Ambulatory Visit: Payer: Medicare Other | Admitting: Lab

## 2013-04-15 ENCOUNTER — Ambulatory Visit: Payer: Medicare Other | Admitting: Oncology

## 2013-04-15 ENCOUNTER — Telehealth: Payer: Self-pay | Admitting: Oncology

## 2013-04-15 NOTE — Telephone Encounter (Signed)
Tallked to pt and r/s lab and MD today to january 2015 per pt rqst

## 2013-05-28 ENCOUNTER — Ambulatory Visit: Payer: Medicare Other | Admitting: Oncology

## 2013-05-28 ENCOUNTER — Telehealth: Payer: Self-pay | Admitting: *Deleted

## 2013-05-28 ENCOUNTER — Other Ambulatory Visit: Payer: Medicare Other

## 2013-05-28 NOTE — Telephone Encounter (Addendum)
Received call from pt stating that she is not feeling well & will not be in today to see Dr Beryle Beams.  She states that she has seen PCP & has been on an ATB & prednisone.  She has sinus problems & h/a & if not better will return to PCP.  She states she has heard Dr Beryle Beams is leaving & she feels awful about this.  She wants to know if he will refer her to Dr Tressie Stalker at [place] because it is a 75 mile round trip for her to come to [place].  Informed that we would discuss with Dr Beryle Beams.  She can be reached at [phone].

## 2013-05-30 ENCOUNTER — Telehealth: Payer: Self-pay | Admitting: *Deleted

## 2013-05-30 NOTE — Telephone Encounter (Signed)
Returned call to pt & informed that referral has been made to Dr. Wayland Denis & our HIM dept will fax over chart info.  She was given the opportunity to see Dr Beryle Beams before this transistion if she would like but she will wait & see how quickly they get her in to see Dr. Tressie Stalker.  Requested that she call back if she hasn't heard from Dr. Jaclyn Prime office in 2 wks.

## 2013-05-31 ENCOUNTER — Telehealth: Payer: Self-pay | Admitting: Oncology

## 2013-05-31 NOTE — Telephone Encounter (Signed)
Faxed pt medical records to Dr. Tressie Stalker

## 2013-06-25 ENCOUNTER — Telehealth: Payer: Self-pay | Admitting: *Deleted

## 2013-06-25 NOTE — Telephone Encounter (Signed)
Pt left vm stating that she is going to [facility] 06/27/13 @ 2:30pm for lab & MD visit.  She reports that she never heard from Dr Tressie Stalker & thought that she would be better off seeing another MD since someone told her Dr Tressie Stalker may be leaving in a couple of years. Wished her well & informed to call back if we could help her with anything.

## 2013-06-27 ENCOUNTER — Encounter (HOSPITAL_COMMUNITY): Payer: Self-pay

## 2013-06-27 ENCOUNTER — Encounter (HOSPITAL_COMMUNITY): Payer: Medicare Other | Attending: Hematology and Oncology

## 2013-06-27 VITALS — BP 108/76 | HR 99 | Temp 98.0°F | Resp 22 | Ht 62.0 in | Wt 188.3 lb

## 2013-06-27 DIAGNOSIS — Z923 Personal history of irradiation: Secondary | ICD-10-CM | POA: Insufficient documentation

## 2013-06-27 DIAGNOSIS — J309 Allergic rhinitis, unspecified: Secondary | ICD-10-CM | POA: Insufficient documentation

## 2013-06-27 DIAGNOSIS — E559 Vitamin D deficiency, unspecified: Secondary | ICD-10-CM

## 2013-06-27 DIAGNOSIS — M949 Disorder of cartilage, unspecified: Secondary | ICD-10-CM

## 2013-06-27 DIAGNOSIS — Z853 Personal history of malignant neoplasm of breast: Secondary | ICD-10-CM | POA: Insufficient documentation

## 2013-06-27 DIAGNOSIS — M899 Disorder of bone, unspecified: Secondary | ICD-10-CM | POA: Insufficient documentation

## 2013-06-27 DIAGNOSIS — Z9010 Acquired absence of unspecified breast and nipple: Secondary | ICD-10-CM | POA: Insufficient documentation

## 2013-06-27 DIAGNOSIS — Z09 Encounter for follow-up examination after completed treatment for conditions other than malignant neoplasm: Secondary | ICD-10-CM | POA: Insufficient documentation

## 2013-06-27 DIAGNOSIS — C9110 Chronic lymphocytic leukemia of B-cell type not having achieved remission: Secondary | ICD-10-CM

## 2013-06-27 DIAGNOSIS — Z856 Personal history of leukemia: Secondary | ICD-10-CM | POA: Insufficient documentation

## 2013-06-27 DIAGNOSIS — Z96649 Presence of unspecified artificial hip joint: Secondary | ICD-10-CM | POA: Insufficient documentation

## 2013-06-27 DIAGNOSIS — C50919 Malignant neoplasm of unspecified site of unspecified female breast: Secondary | ICD-10-CM

## 2013-06-27 NOTE — Progress Notes (Signed)
Unsuccessful lab draw.  Patient to increase fluid intake tonight and tomorrow morning and to return for lab draw

## 2013-06-27 NOTE — Patient Instructions (Signed)
[facility] Discharge Instructions  RECOMMENDATIONS MADE BY THE CONSULTANT AND ANY TEST RESULTS WILL BE SENT TO YOUR REFERRING PHYSICIAN.  EXAM FINDINGS BY THE PHYSICIAN TODAY AND SIGNS OR SYMPTOMS TO REPORT TO CLINIC OR PRIMARY PHYSICIAN: Exam and findings as discussed by Dr. Barnet [facility].  Report fevers, chills, night sweats, unexplained weight loss, new lumps, bone pain or other concerns.  We are checking some blood work today and if there are any concerns we will contact you.  MEDICATIONS PRESCRIBED:  none  INSTRUCTIONS/FOLLOW-UP: Follow-up in 6 months with blood work and to see the provider a few days later.  Thank you for choosing [facility] to provide your oncology and hematology care.  To afford each patient quality time with our providers, please arrive at least 15 minutes before your scheduled appointment time.  With your help, our goal is to use those 15 minutes to complete the necessary work-up to ensure our physicians have the information they need to help with your evaluation and healthcare recommendations.    Effective January 1st, 2014, we ask that you re-schedule your appointment with our physicians should you arrive 10 or more minutes late for your appointment.  We strive to give you quality time with our providers, and arriving late affects you and other patients whose appointments are after yours.    Again, thank you for choosing [facility].  Our hope is that these requests will decrease the amount of time that you wait before being seen by our physicians.       _____________________________________________________________  Should you have questions after your visit to [facility], please contact our office at [phone] between the hours of 8:30 a.m. and 5:00 p.m.  Voicemails left after 4:30 p.m. will not be returned until the following business day.  For prescription refill requests, have your pharmacy contact  our office with your prescription refill request.

## 2013-06-27 NOTE — Progress Notes (Signed)
[place] A. Barnet [facility], M.D.  NEW PATIENT EVALUATION   Name: [name] Date: 06/27/2013 MRN: [id number] DOB: 11-24-1935  PCP: [name], MD   REFERRING PHYSICIAN: [name], MD  REASON FOR REFERRAL: Followup of chronic lymphocytic leukemia and breast cancer     HISTORY OF PRESENT ILLNESS:[name] is a 78 y.o. female who is here today to establish followup for chronic lymphocytic leukemia and breast cancer having been seen in [place] previously. She feels well with excellent appetite and slight swelling of the right upper extremity. She denies any fever, night sweats, vaginal discharge or bleeding, dysuria, hematuria, diarrhea, constipation, melena, hematochezia, hematuria, cough, wheezing, lower extremity swelling or redness, joint pain or swelling, skin rash, headache, or seizures. Oncologic history: Chronic lymphocytic leukemia diagnosed 24 years ago, no treatment                                         ever.                                Locally advanced right breast cancer, status post neoadjuvant                                                   chemotherapy, right modified radical mastectomy, radiotherapy, and                                 Megace 40 mg daily with original diagnosis made in August of 1999. Megace is being utilized for his anticancer effect as well as controlling hot flashes.   PAST MEDICAL HISTORY:  has a past medical history of CLL (chronic lymphocytic leukemia) (07/21/2011); Breast cancer, stage 3 (07/21/2011); and Bronchitis.     PAST SURGICAL HISTORY: Past Surgical History  Procedure Laterality Date  . Appendectomy    . Abdominal hysterectomy    . Cholecystectomy    . Mastectomy      right  . Total hip arthroplasty      right     CURRENT MEDICATIONS: has a current medication list which includes the following prescription(s): albuterol, albuterol, aspirin,  megestrol, and omeprazole.   ALLERGIES: Codeine   SOCIAL HISTORY:  reports that she has never smoked. She has never used smokeless tobacco. She reports that she does not drink alcohol or use illicit drugs.   FAMILY HISTORY: family history includes Cancer in her brother and another family member.    REVIEW OF SYSTEMS:  Other than that discussed above is noncontributory.    PHYSICAL EXAM:  height is 5\' 2"  (1.575 m) and weight is 188 lb 4.8 oz (85.412 kg). Her oral temperature is 98 F (36.7 C). Her blood pressure is 108/76 and her pulse is 99. Her respiration is 22 and oxygen saturation is 98%.    GENERAL:alert, no distress and comfortable SKIN: skin color, texture, turgor are normal, no rashes or significant lesions EYES: normal, Conjunctiva are pink and non-injected, sclera clear OROPHARYNX:no exudate, no erythema and lips, buccal mucosa, and tongue normal  NECK: supple, thyroid normal size, non-tender,  without nodularity CHEST:  Status post right mastectomy with no subcutaneous nodules. Left breast without mass. LYMPH:  no palpable lymphadenopathy in the cervical, axillary or inguinal LUNGS: clear to auscultation and percussion with normal breathing effort HEART: regular rate & rhythm and no murmurs ABDOMEN:abdomen soft, non-tender and normal bowel sounds MUSCULOSKELETALl:no cyanosis of digits, no clubbing or edema  NEURO: alert & oriented x 3 with fluent speech, no focal motor/sensory deficits    LABORATORY DATA:  No visits with results within 30 Day(s) from this visit. Latest known visit with results is:  Infusion on 12/28/2012  Component Date Value Range Status  . WBC 12/28/2012 69.9* 4.0 - 10.5 K/uL Final   Comment: RESULT REPEATED AND VERIFIED                          WHITE COUNT CONFIRMED ON SMEAR                          CRITICAL RESULT CALLED TO, READ BACK BY AND VERIFIED WITH:                          ROBERTSON T RN ON [id number] AT 1145 BY RESSEGGER R  . RBC  12/28/2012 4.87  3.87 - 5.11 MIL/uL Final  . Hemoglobin 12/28/2012 13.9  12.0 - 15.0 g/dL Final  . HCT 12/28/2012 44.0  36.0 - 46.0 % Final  . MCV 12/28/2012 90.3  78.0 - 100.0 fL Final  . MCH 12/28/2012 28.5  26.0 - 34.0 pg Final  . MCHC 12/28/2012 31.6  30.0 - 36.0 g/dL Final  . RDW 12/28/2012 14.8  11.5 - 15.5 % Final  . Platelets 12/28/2012 260  150 - 400 K/uL Final  . Neutrophils Relative % 12/28/2012 7* 43 - 77 % Final  . Neutro Abs 12/28/2012 5.2  1.7 - 7.7 K/uL Final  . Lymphocytes Relative 12/28/2012 91* 12 - 46 % Final  . Lymphs Abs 12/28/2012 63.3* 0.7 - 4.0 K/uL Final  . Monocytes Relative 12/28/2012 1* 3 - 12 % Final  . Monocytes Absolute 12/28/2012 0.9  0.1 - 1.0 K/uL Final  . Eosinophils Relative 12/28/2012 1  0 - 5 % Final  . Eosinophils Absolute 12/28/2012 0.3  0.0 - 0.7 K/uL Final  . Basophils Relative 12/28/2012 0  0 - 1 % Final  . Basophils Absolute 12/28/2012 0.2* 0.0 - 0.1 K/uL Final  . WBC Morphology 12/28/2012 ABSOLUTE LYMPHOCYTOSIS   Final   Comment: ATYPICAL LYMPHOCYTES                          SMUDGE CELLS    Urinalysis    Component Value Date/Time   COLORURINE YELLOW 10/17/2008 1128   APPEARANCEUR CLOUDY* 10/17/2008 1128   LABSPEC 1.018 10/17/2008 1128   PHURINE 5.5 10/17/2008 1128   GLUCOSEU NEGATIVE 10/17/2008 1128   HGBUR MODERATE* 10/17/2008 [place] 10/17/2008 [place] 10/17/2008 1128   [place] 10/17/2008 1128   UROBILINOGEN 0.2 10/17/2008 1128   NITRITE NEGATIVE 10/17/2008 1128   LEUKOCYTESUR LARGE* 10/17/2008 1128      @RADIOGRAPHY :     CLINICAL DATA: Screening.  EXAM:  DIGITAL SCREENING UNILATERAL LEFT MAMMOGRAM WITH CAD  COMPARISON: None.  ACR Breast Density Category b: There are scattered areas of  fibroglandular density.  FINDINGS:  There are no findings suspicious  for malignancy. Images were  processed with CAD.  IMPRESSION:  No mammographic evidence of malignancy. A result letter of this    screening mammogram will be mailed directly to the patient.  RECOMMENDATION:  Screening mammogram in one year. (Code:[id number]-B-01Y)  BI-RADS CATEGORY 1: Negative  Electronically Signed  By: Lillia Mountain M.D.  On: 03/22/2013 10   .  PATHOLOGY: to be reviewed.   IMPRESSION:  #1. Locally advanced right breast cancer, no evidence of disease pending today's lab reports, currently taking Megace having undergone neoadjuvant chemotherapy followed by right modified radical mastectomy followed by external beam radiotherapy followed by Megace which she continues to take since 1999. #2. Chronic lymphocytic leukemia for the last 24 years, never having been treated. #3. Allergic rhinitis. #4. Osteopenia   PLAN:  #1. The patient was reassured and told to continue her current medications. #2. Repeat mammogram will be done October 2015. #3. Patient will be contacted if any abnormalities on lab tests surface today. #4. Followup in 6 months   Doroteo Bradford, MD 06/27/2013 3:45 PM

## 2013-06-28 ENCOUNTER — Encounter (HOSPITAL_BASED_OUTPATIENT_CLINIC_OR_DEPARTMENT_OTHER): Payer: Medicare Other

## 2013-06-28 ENCOUNTER — Telehealth (HOSPITAL_COMMUNITY): Payer: Self-pay | Admitting: *Deleted

## 2013-06-28 DIAGNOSIS — C50919 Malignant neoplasm of unspecified site of unspecified female breast: Secondary | ICD-10-CM

## 2013-06-28 DIAGNOSIS — C9110 Chronic lymphocytic leukemia of B-cell type not having achieved remission: Secondary | ICD-10-CM

## 2013-06-28 DIAGNOSIS — E559 Vitamin D deficiency, unspecified: Secondary | ICD-10-CM

## 2013-06-28 LAB — COMPREHENSIVE METABOLIC PANEL WITH GFR
ALT: 17 U/L (ref 0–35)
AST: 21 U/L (ref 0–37)
Albumin: 4 g/dL (ref 3.5–5.2)
Alkaline Phosphatase: 61 U/L (ref 39–117)
BUN: 16 mg/dL (ref 6–23)
CO2: 23 meq/L (ref 19–32)
Calcium: 9.6 mg/dL (ref 8.4–10.5)
Chloride: 106 meq/L (ref 96–112)
Creatinine, Ser: 0.79 mg/dL (ref 0.50–1.10)
GFR calc Af Amer: >90 mL/min
GFR calc non Af Amer: 79 mL/min — ABNORMAL LOW
Glucose, Bld: 112 mg/dL — ABNORMAL HIGH (ref 70–99)
Potassium: 4 meq/L (ref 3.7–5.3)
Sodium: 143 meq/L (ref 137–147)
Total Bilirubin: 0.4 mg/dL (ref 0.3–1.2)
Total Protein: 7.3 g/dL (ref 6.0–8.3)

## 2013-06-28 LAB — CBC WITH DIFFERENTIAL/PLATELET
BASOS PCT: 0 % (ref 0–1)
Basophils Absolute: 0.2 10*3/uL — ABNORMAL HIGH (ref 0.0–0.1)
EOS PCT: 0 % (ref 0–5)
Eosinophils Absolute: 0.3 10*3/uL (ref 0.0–0.7)
HCT: 44.4 % (ref 36.0–46.0)
Hemoglobin: 14.6 g/dL (ref 12.0–15.0)
Lymphocytes Relative: 90 % — ABNORMAL HIGH (ref 12–46)
Lymphs Abs: 64.9 10*3/uL — ABNORMAL HIGH (ref 0.7–4.0)
MCH: 30.4 pg (ref 26.0–34.0)
MCHC: 32.9 g/dL (ref 30.0–36.0)
MCV: 92.3 fL (ref 78.0–100.0)
MONOS PCT: 2 % — AB (ref 3–12)
Monocytes Absolute: 1.3 10*3/uL — ABNORMAL HIGH (ref 0.1–1.0)
NEUTROS PCT: 8 % — AB (ref 43–77)
Neutro Abs: 5.7 10*3/uL (ref 1.7–7.7)
Platelets: 282 10*3/uL (ref 150–400)
RBC: 4.81 MIL/uL (ref 3.87–5.11)
RDW: 15.1 % (ref 11.5–15.5)
WBC: 72.5 10*3/uL (ref 4.0–10.5)

## 2013-06-28 LAB — LACTATE DEHYDROGENASE: LDH: 179 U/L (ref 94–250)

## 2013-06-28 LAB — RETICULOCYTES
RBC.: 4.81 MIL/uL (ref 3.87–5.11)
RETIC CT PCT: 1.1 % (ref 0.4–3.1)
Retic Count, Absolute: 52.9 10*3/uL (ref 19.0–186.0)

## 2013-06-28 NOTE — Progress Notes (Signed)
Labs drawn today for cbc/diff,cmp,retic,ldh,b50mic,ca2729,cea,vd25

## 2013-06-28 NOTE — Telephone Encounter (Signed)
CRITICAL VALUE ALERT Critical value received:  WBC 72.5 Date of notification:  06/28/13 Time of notification: 9381 Critical value read back:  yes Nurse who received alert:  A. Ouida Sills, RN MD notified (1st page):  Dr. Barnet [place]

## 2013-06-29 LAB — CEA: CEA: 0.8 ng/mL (ref 0.0–5.0)

## 2013-06-29 LAB — CANCER ANTIGEN 27.29: CA 27.29: 23 U/mL (ref 0–39)

## 2013-06-29 LAB — VITAMIN D 25 HYDROXY (VIT D DEFICIENCY, FRACTURES): VIT D 25 HYDROXY: 25 ng/mL — AB (ref 30–89)

## 2013-07-01 LAB — BETA 2 MICROGLOBULIN, SERUM: Beta-2 Microglobulin: 2.68 mg/L — ABNORMAL HIGH (ref 1.01–1.73)

## 2013-10-01 ENCOUNTER — Other Ambulatory Visit (HOSPITAL_COMMUNITY): Payer: Self-pay | Admitting: Family Medicine

## 2013-10-01 ENCOUNTER — Ambulatory Visit (HOSPITAL_COMMUNITY)
Admission: RE | Admit: 2013-10-01 | Discharge: 2013-10-01 | Disposition: A | Discharge status: Home / Self Care | Payer: Medicare Other | Source: Ambulatory Visit | Attending: Family Medicine | Admitting: Family Medicine

## 2013-10-01 DIAGNOSIS — N200 Calculus of kidney: Secondary | ICD-10-CM | POA: Insufficient documentation

## 2013-10-01 DIAGNOSIS — M545 Low back pain, unspecified: Secondary | ICD-10-CM

## 2013-10-01 DIAGNOSIS — M47817 Spondylosis without myelopathy or radiculopathy, lumbosacral region: Secondary | ICD-10-CM | POA: Insufficient documentation

## 2013-10-16 ENCOUNTER — Other Ambulatory Visit (HOSPITAL_COMMUNITY): Payer: Self-pay | Admitting: Family Medicine

## 2013-10-16 ENCOUNTER — Ambulatory Visit (HOSPITAL_COMMUNITY)
Admission: RE | Admit: 2013-10-16 | Discharge: 2013-10-16 | Disposition: A | Discharge status: Home / Self Care | Payer: Medicare Other | Source: Ambulatory Visit | Attending: Family Medicine | Admitting: Family Medicine

## 2013-10-16 DIAGNOSIS — M949 Disorder of cartilage, unspecified: Secondary | ICD-10-CM

## 2013-10-16 DIAGNOSIS — J42 Unspecified chronic bronchitis: Secondary | ICD-10-CM

## 2013-10-16 DIAGNOSIS — I517 Cardiomegaly: Secondary | ICD-10-CM | POA: Insufficient documentation

## 2013-10-16 DIAGNOSIS — M899 Disorder of bone, unspecified: Secondary | ICD-10-CM | POA: Insufficient documentation

## 2013-10-16 DIAGNOSIS — J4489 Other specified chronic obstructive pulmonary disease: Secondary | ICD-10-CM | POA: Insufficient documentation

## 2013-10-16 DIAGNOSIS — M47814 Spondylosis without myelopathy or radiculopathy, thoracic region: Secondary | ICD-10-CM | POA: Insufficient documentation

## 2013-10-16 DIAGNOSIS — J449 Chronic obstructive pulmonary disease, unspecified: Secondary | ICD-10-CM | POA: Insufficient documentation

## 2013-12-18 ENCOUNTER — Encounter (HOSPITAL_COMMUNITY): Payer: Medicare Other | Attending: Hematology and Oncology

## 2013-12-18 ENCOUNTER — Telehealth (HOSPITAL_COMMUNITY): Payer: Self-pay

## 2013-12-18 DIAGNOSIS — C9110 Chronic lymphocytic leukemia of B-cell type not having achieved remission: Secondary | ICD-10-CM | POA: Insufficient documentation

## 2013-12-18 DIAGNOSIS — C50319 Malignant neoplasm of lower-inner quadrant of unspecified female breast: Secondary | ICD-10-CM | POA: Diagnosis not present

## 2013-12-18 DIAGNOSIS — C50311 Malignant neoplasm of lower-inner quadrant of right female breast: Secondary | ICD-10-CM

## 2013-12-18 LAB — COMPREHENSIVE METABOLIC PANEL
ALBUMIN: 3.9 g/dL (ref 3.5–5.2)
ALK PHOS: 74 U/L (ref 39–117)
ALT: 30 U/L (ref 0–35)
ANION GAP: 16 — AB (ref 5–15)
AST: 31 U/L (ref 0–37)
BUN: 13 mg/dL (ref 6–23)
CO2: 21 mEq/L (ref 19–32)
CREATININE: 0.65 mg/dL (ref 0.50–1.10)
Calcium: 9 mg/dL (ref 8.4–10.5)
Chloride: 106 mEq/L (ref 96–112)
GFR calc Af Amer: >90 mL/min (ref >90)
GFR calc non Af Amer: 84 mL/min — ABNORMAL LOW (ref >90)
Glucose, Bld: 122 mg/dL — ABNORMAL HIGH (ref 70–99)
POTASSIUM: 3.9 meq/L (ref 3.7–5.3)
Sodium: 143 mEq/L (ref 137–147)
Total Bilirubin: 0.4 mg/dL (ref 0.3–1.2)
Total Protein: 6.7 g/dL (ref 6.0–8.3)

## 2013-12-18 LAB — CBC WITH DIFFERENTIAL/PLATELET
BASOS ABS: 0.2 10*3/uL — AB (ref 0.0–0.1)
BASOS PCT: 0 % (ref 0–1)
Eosinophils Absolute: 0.3 10*3/uL (ref 0.0–0.7)
Eosinophils Relative: 1 % (ref 0–5)
HEMATOCRIT: 42.6 % (ref 36.0–46.0)
Hemoglobin: 13.8 g/dL (ref 12.0–15.0)
LYMPHS PCT: 88 % — AB (ref 12–46)
Lymphs Abs: 47.6 10*3/uL — ABNORMAL HIGH (ref 0.7–4.0)
MCH: 29.3 pg (ref 26.0–34.0)
MCHC: 32.4 g/dL (ref 30.0–36.0)
MCV: 90.4 fL (ref 78.0–100.0)
Monocytes Absolute: 1 10*3/uL (ref 0.1–1.0)
Monocytes Relative: 2 % — ABNORMAL LOW (ref 3–12)
NEUTROS ABS: 5.3 10*3/uL (ref 1.7–7.7)
Neutrophils Relative %: 10 % — ABNORMAL LOW (ref 43–77)
PLATELETS: 232 10*3/uL (ref 150–400)
RBC: 4.71 MIL/uL (ref 3.87–5.11)
RDW: 14.5 % (ref 11.5–15.5)
WBC: 54.3 10*3/uL — AB (ref 4.0–10.5)

## 2013-12-18 NOTE — Progress Notes (Signed)
LABS DRAWN FOR CBCD,CMP 

## 2013-12-18 NOTE — Telephone Encounter (Signed)
CRITICAL VALUE ALERT Critical value received:  WBC 54.3 Date of notification:  12/18/13  Time of notification: 5436 Critical value read back:  Yes.   Nurse who received alert:  Mickie Kay, RN MD notified (1st page):  Dr. Barnet [facility] @ [phone]

## 2013-12-24 ENCOUNTER — Other Ambulatory Visit (HOSPITAL_COMMUNITY): Payer: Medicare Other

## 2013-12-25 ENCOUNTER — Encounter (HOSPITAL_COMMUNITY): Payer: Self-pay

## 2013-12-25 ENCOUNTER — Encounter (HOSPITAL_COMMUNITY): Payer: Medicare Other | Attending: Hematology and Oncology

## 2013-12-25 VITALS — BP 132/92 | HR 92 | Temp 98.6°F | Resp 20 | Wt 182.0 lb

## 2013-12-25 DIAGNOSIS — C9110 Chronic lymphocytic leukemia of B-cell type not having achieved remission: Secondary | ICD-10-CM | POA: Insufficient documentation

## 2013-12-25 DIAGNOSIS — M899 Disorder of bone, unspecified: Secondary | ICD-10-CM

## 2013-12-25 DIAGNOSIS — C50319 Malignant neoplasm of lower-inner quadrant of unspecified female breast: Secondary | ICD-10-CM | POA: Insufficient documentation

## 2013-12-25 DIAGNOSIS — C50311 Malignant neoplasm of lower-inner quadrant of right female breast: Secondary | ICD-10-CM

## 2013-12-25 DIAGNOSIS — M949 Disorder of cartilage, unspecified: Secondary | ICD-10-CM

## 2013-12-25 MED ORDER — MEGESTROL ACETATE 40 MG PO TABS: 40.0000 mg | ORAL_TABLET | Freq: Every day | ORAL | Status: DC

## 2013-12-25 NOTE — Patient Instructions (Signed)
[facility] Discharge Instructions  RECOMMENDATIONS MADE BY THE CONSULTANT AND ANY TEST RESULTS WILL BE SENT TO YOUR REFERRING PHYSICIAN.  EXAM FINDINGS BY THE PHYSICIAN TODAY AND SIGNS OR SYMPTOMS TO REPORT TO CLINIC OR PRIMARY PHYSICIAN: Exam and findings as discussed by Dr.Formanek.  MEDICATIONS PRESCRIBED: Restart Megace, a Mccauley Diehl prescription has been sent to your pharmacy. Take as directed.  Continue all as prescribed.  INSTRUCTIONS/FOLLOW-UP: Return to clinic in 6 months for lab work and MD appointment. Report any issues/concerns as needed prior to appointment.  Thank you for choosing [facility] to provide your oncology and hematology care.  To afford each patient quality time with our providers, please arrive at least 15 minutes before your scheduled appointment time.  With your help, our goal is to use those 15 minutes to complete the necessary work-up to ensure our physicians have the information they need to help with your evaluation and healthcare recommendations.    Effective January 1st, 2014, we ask that you re-schedule your appointment with our physicians should you arrive 10 or more minutes late for your appointment.  We strive to give you quality time with our providers, and arriving late affects you and other patients whose appointments are after yours.    Again, thank you for choosing [facility].  Our hope is that these requests will decrease the amount of time that you wait before being seen by our physicians.       _____________________________________________________________  Should you have questions after your visit to [facility], please contact our office at [phone] between the hours of 8:30 a.m. and 4:30 p.m.  Voicemails left after 4:30 p.m. will not be returned until the following business day.  For prescription refill requests, have your pharmacy contact our office with your prescription refill  request.    _______________________________________________________________  We hope that we have given you very good care.  You may receive a patient satisfaction survey in the mail, please complete it and return it as soon as possible.  We value your feedback!  _______________________________________________________________  Have you asked about our STAR program?  STAR stands for Survivorship Training and Rehabilitation, and this is a nationally recognized cancer care program that focuses on survivorship and rehabilitation.  Cancer and cancer treatments may cause problems, such as, pain, making you feel tired and keeping you from doing the things that you need or want to do. Cancer rehabilitation can help. Our goal is to reduce these troubling effects and help you have the best quality of life possible.  You may receive a survey from a nurse that asks questions about your current state of health.  Based on the survey results, all eligible patients will be referred to the [facility] program for an evaluation so we can better serve you!  A frequently asked questions sheet is available upon request.

## 2013-12-25 NOTE — Progress Notes (Signed)
     [facility]  OFFICE PROGRESS NOTE  KNOWLTON,STEPHEN D, MD [address] [place] [address]  DIAGNOSIS: Breast cancer of lower-inner quadrant of right female breast - Plan: CBC with Differential, Comprehensive metabolic panel, MM Digital Diagnostic Unilat L, CBC with Differential, CEA, Cancer antigen 27.29  CLL (chronic lymphocytic leukemia) - Plan: CBC with Differential, Reticulocytes, Lactate dehydrogenase, Comprehensive metabolic panel, Beta 2 microglobulin, serum  Chief Complaint  Patient presents with  . Chronic lymphocytic leukemia  . Breast Cancer    CURRENT THERAPY: Followup of chronic lymphocytic leukemia and breast cancer, taking Megace since 1999.  INTERVAL HISTORY: [name] 78 y.o. female returns for followup after initiation of followup on 06/27/2013 for chronic lymphocytic leukemia and breast cancer having been seen in the past in [place].  A few months ago she was told by her insurance company that she was on a high-risk drug. This information was related to her pharmacist. The patient called her family doctor and Megace was stopped. Since then she's had increasing hot flashes with left breast discomfort, abdominal pain, but no nausea, vomiting, vaginal bleeding or discharge, lower extremity swelling or redness, PND, orthopnea, palpitations, headache, or seizures. She denies any easy satiety, dark urine, or lymphadenopathy.  MEDICAL HISTORY: Past Medical History  Diagnosis Date  . CLL (chronic lymphocytic leukemia) 07/21/2011  . Breast cancer, stage 3 07/21/2011  . Bronchitis     INTERIM HISTORY: has HYPERLIPIDEMIA; ANXIETY; DEPRESSION; SLEEP APNEA, OBSTRUCTIVE, SEVERE; ALLERGIC RHINITIS; ARTHRITIS; ARTHRITIS, RIGHT HIP; HIP PAIN, RIGHT; LOW BACK PAIN; OSTEOPENIA; FATIGUE; BREAST CANCER, HX OF; HX, PERSONAL, LEUKEMIA NOS; MIGRAINES, HX OF; CLL (chronic lymphocytic leukemia); Breast cancer, stage 3; and Mass of  arm on her problem list.   Oncologic history: Chronic lymphocytic leukemia diagnosed 24 years ago, no treatment ever.  Locally advanced right breast cancer, status post neoadjuvant chemotherapy, right modified radical mastectomy, radiotherapy, and  Megace 40 mg daily with original diagnosis made in August of 1999.  Megace is being utilized for his anticancer effect as well as controlling hot flashes.  ALLERGIES:  is allergic to codeine.  MEDICATIONS: has a current medication list which includes the following prescription(s): albuterol, albuterol, aspirin, omeprazole, and megestrol.  SURGICAL HISTORY:  Past Surgical History  Procedure Laterality Date  . Appendectomy    . Abdominal hysterectomy    . Cholecystectomy    . Mastectomy      right  . Total hip arthroplasty      right    FAMILY HISTORY: family history includes Cancer in her brother and another family member.  SOCIAL HISTORY:  reports that she has never smoked. She has never used smokeless tobacco. She reports that she does not drink alcohol or use illicit drugs.  REVIEW OF SYSTEMS:  Other than that discussed above is noncontributory.  PHYSICAL EXAMINATION: ECOG PERFORMANCE STATUS: 1 - Symptomatic but completely ambulatory  Blood pressure 132/92, pulse 92, temperature 98.6 F (37 C), temperature source Oral, resp. rate 20, weight 182 lb (82.555 kg).  GENERAL:alert, no distress and comfortable SKIN: skin color, texture, turgor are normal, no rashes or significant lesions EYES: PERLA; Conjunctiva are pink and non-injected, sclera clear SINUSES: No redness or tenderness over maxillary or ethmoid sinuses OROPHARYNX:no exudate, no erythema on lips, buccal mucosa, or tongue. NECK: supple, thyroid normal size, non-tender, without nodularity. No masses CHEST: Status post right mastectomy with no subcutaneous nodules. Left breast without mass but slightly tender. No evidence of nipple   discharge. Increased AP diameter. LYMPH:   no palpable lymphadenopathy in the cervical, axillary or inguinal LUNGS: clear to auscultation and percussion with normal breathing effort HEART: regular rate & rhythm and no murmurs. ABDOMEN:abdomen soft, non-tender and normal bowel sounds. No hepatomegaly, ascites, or CVA tenderness. MUSCULOSKELETAL:no cyanosis of digits and no clubbing. Range of motion normal.  NEURO: alert & oriented x 3 with fluent speech, no focal motor/sensory deficits   LABORATORY DATA: Lab on 12/18/2013  Component Date Value Ref Range Status  . WBC 12/18/2013 54.3* 4.0 - 10.5 K/uL Final   Comment: RESULT REPEATED AND VERIFIED                          CRITICAL RESULT CALLED TO, READ BACK BY AND VERIFIED WITH:                          BURGES,S [id number] ON 12/18/13 BY ISLEY,B  . RBC 12/18/2013 4.71  3.87 - 5.11 MIL/uL Final  . Hemoglobin 12/18/2013 13.8  12.0 - 15.0 g/dL Final  . HCT 12/18/2013 42.6  36.0 - 46.0 % Final  . MCV 12/18/2013 90.4  78.0 - 100.0 fL Final  . MCH 12/18/2013 29.3  26.0 - 34.0 pg Final  . MCHC 12/18/2013 32.4  30.0 - 36.0 g/dL Final  . RDW 12/18/2013 14.5  11.5 - 15.5 % Final  . Platelets 12/18/2013 232  150 - 400 K/uL Final  . Neutrophils Relative % 12/18/2013 10* 43 - 77 % Final  . Neutro Abs 12/18/2013 5.3  1.7 - 7.7 K/uL Final  . Lymphocytes Relative 12/18/2013 88* 12 - 46 % Final  . Lymphs Abs 12/18/2013 47.6* 0.7 - 4.0 K/uL Final  . Monocytes Relative 12/18/2013 2* 3 - 12 % Final  . Monocytes Absolute 12/18/2013 1.0  0.1 - 1.0 K/uL Final  . Eosinophils Relative 12/18/2013 1  0 - 5 % Final  . Eosinophils Absolute 12/18/2013 0.3  0.0 - 0.7 K/uL Final  . Basophils Relative 12/18/2013 0  0 - 1 % Final  . Basophils Absolute 12/18/2013 0.2* 0.0 - 0.1 K/uL Final  . WBC Morphology 12/18/2013 ABSOLUTE LYMPHOCYTOSIS   Final   Comment: ATYPICAL LYMPHOCYTES                          SMUDGE CELLS  . Sodium 12/18/2013 143  137 - 147 mEq/L Final  . Potassium 12/18/2013 3.9  3.7 - 5.3 mEq/L  Final  . Chloride 12/18/2013 106  96 - 112 mEq/L Final  . CO2 12/18/2013 21  19 - 32 mEq/L Final  . Glucose, Bld 12/18/2013 122* 70 - 99 mg/dL Final  . BUN 12/18/2013 13  6 - 23 mg/dL Final  . Creatinine, Ser 12/18/2013 0.65  0.50 - 1.10 mg/dL Final  . Calcium 12/18/2013 9.0  8.4 - 10.5 mg/dL Final  . Total Protein 12/18/2013 6.7  6.0 - 8.3 g/dL Final  . Albumin 12/18/2013 3.9  3.5 - 5.2 g/dL Final  . AST 12/18/2013 31  0 - 37 U/L Final  . ALT 12/18/2013 30  0 - 35 U/L Final  . Alkaline Phosphatase 12/18/2013 74  39 - 117 U/L Final  . Total Bilirubin 12/18/2013 0.4  0.3 - 1.2 mg/dL Final  . GFR calc non Af Amer 12/18/2013 84* >90 mL/min Final  . GFR calc Af Amer 12/18/2013 >90  >90 mL/min Final  Comment: (NOTE)                          The eGFR has been calculated using the CKD EPI equation.                          This calculation has not been validated in all clinical situations.                          eGFR's persistently <90 mL/min signify possible Chronic Kidney                          Disease.  . Anion gap 12/18/2013 16* 5 - 15 Final    PATHOLOGY: No new pathology.  Urinalysis    Component Value Date/Time   COLORURINE YELLOW 10/17/2008 1128   APPEARANCEUR CLOUDY* 10/17/2008 1128   LABSPEC 1.018 10/17/2008 1128   PHURINE 5.5 10/17/2008 1128   GLUCOSEU NEGATIVE 10/17/2008 1128   HGBUR MODERATE* 10/17/2008 1128   BILIRUBINUR NEGATIVE 10/17/2008 1128   KETONESUR NEGATIVE 10/17/2008 1128   PROTEINUR NEGATIVE 10/17/2008 1128   UROBILINOGEN 0.2 10/17/2008 1128   NITRITE NEGATIVE 10/17/2008 1128   LEUKOCYTESUR LARGE* 10/17/2008 1128    RADIOGRAPHIC STUDIES:  MM Digital Screening Unilat L Status: Final result            Study Result    CLINICAL DATA: Screening.  EXAM:  DIGITAL SCREENING UNILATERAL LEFT MAMMOGRAM WITH CAD  COMPARISON: None.  ACR Breast Density Category b: There are scattered areas of  fibroglandular density.  FINDINGS:  There are no findings  suspicious for malignancy. Images were  processed with CAD.  IMPRESSION:  No mammographic evidence of malignancy. A result letter of this  screening mammogram will be mailed directly to the patient.  RECOMMENDATION:  Screening mammogram in one year. (Code:SM-B-01Y)  BI-RADS CATEGORY 1: Negative  Electronically Signed  By: Dina Arceo M.D.  On:        DG Lumbar Spine Complete Status: Final result         PACS Images    Show images for DG Lumbar Spine Complete         Study Result    CLINICAL DATA: Low back pain  EXAM:  LUMBAR SPINE - COMPLETE 4+ VIEW  COMPARISON: None.  FINDINGS:  There is mild depression of the upper endplate of T12 and minor  depression of the upper endplate of L1. This is of unclear  chronicity that likely old. All remaining visualized vertebrae are  normal in height. There is no spondylolisthesis. Disc spaces are  relatively well preserved. Mild facet degenerative changes noted at  L5-S1 bilaterally.  Bones are demineralized.  There is a large calcification with smaller associated  calcifications projecting in the lower pole of the right kidney. The  large calcification measures 2 cm.  IMPRESSION:  1. Mild depression of the upper endplate of T12 with minor  depression of the upper endplate of L1. These are most likely old,  but either could reflect a recent compression fracture.  2. Mild facet degenerative change at L5-S1. No other significant  degenerative change.  3. Intrarenal stones on the right.  Electronically Signed  By: David Ormond M.D.  On: 10/01/2013 13:00       DG Chest 2 View Status: Final result         PACS Images      Show images for DG Chest 2 View         Study Result    CLINICAL DATA: Bronchitis.  EXAM:  CHEST 2 VIEW  COMPARISON: 04/16/ 2014.  FINDINGS:  Stable cardiomegaly with normal pulmonary vascularity. Lungs are  clear of acute infiltrate. COPD cannot be excluded. No pleural  effusion or  pneumothorax. Degenerative changes and osteopenia  thoracic spine.  IMPRESSION:  1. Stable cardiomegaly, normal pulmonary vascularity.  2. COPD. No acute pulmonary disease.  Electronically Signed  By: Thomas Register  On: 10/16/2013 15:33     ASSESSMENT:  #1. Locally advanced right breast cancer, no evidence of disease pending today's lab reports, currently taking Megace having undergone neoadjuvant chemotherapy followed by right modified radical mastectomy followed by external beam radiotherapy followed by Megace which she continues to take since 1999.  #2. Chronic lymphocytic leukemia for the last 24 years, never having been treated.  #3. Allergic rhinitis.  #4. Osteopenia #5. Vasomotor instability after discontinuation of Megace.    PLAN:  #1. Resume Megace 40 mg daily. #2. Left mammogram in October 2015.  #3. Followup in 6 months with CBC, chem profile, CEA, CA 27-29, beta 2 microglobulin, LDH, reticulocyte count.#3.   All questions were answered. The patient knows to call the clinic with any problems, questions or concerns. We can certainly see the patient much sooner if necessary.   I spent 25 minutes counseling the patient face to face. The total time spent in the appointment was 30 minutes.    Formanek, Gregory A, MD 12/26/2013 8:15 AM  DISCLAIMER:  This note was dictated with voice recognition software.  Similar sounding words can inadvertently be transcribed inaccurately and may not be corrected upon review.    

## 2014-02-25 ENCOUNTER — Other Ambulatory Visit (HOSPITAL_COMMUNITY): Payer: Self-pay | Admitting: Hematology and Oncology

## 2014-02-25 ENCOUNTER — Ambulatory Visit (HOSPITAL_COMMUNITY)
Admission: RE | Admit: 2014-02-25 | Discharge: 2014-02-25 | Disposition: A | Discharge status: Home / Self Care | Payer: Medicare Other | Source: Ambulatory Visit | Attending: Hematology and Oncology | Admitting: Hematology and Oncology

## 2014-02-25 DIAGNOSIS — Z853 Personal history of malignant neoplasm of breast: Secondary | ICD-10-CM | POA: Insufficient documentation

## 2014-02-25 DIAGNOSIS — Z1231 Encounter for screening mammogram for malignant neoplasm of breast: Secondary | ICD-10-CM | POA: Diagnosis not present

## 2014-02-25 DIAGNOSIS — C50311 Malignant neoplasm of lower-inner quadrant of right female breast: Secondary | ICD-10-CM

## 2014-02-28 ENCOUNTER — Other Ambulatory Visit (HOSPITAL_COMMUNITY): Payer: Self-pay | Admitting: Hematology and Oncology

## 2014-02-28 DIAGNOSIS — R928 Other abnormal and inconclusive findings on diagnostic imaging of breast: Secondary | ICD-10-CM

## 2014-03-01 ENCOUNTER — Other Ambulatory Visit (HOSPITAL_COMMUNITY): Payer: Self-pay | Admitting: Hematology and Oncology

## 2014-03-01 DIAGNOSIS — R928 Other abnormal and inconclusive findings on diagnostic imaging of breast: Secondary | ICD-10-CM

## 2014-03-11 ENCOUNTER — Ambulatory Visit (HOSPITAL_COMMUNITY)
Admission: RE | Admit: 2014-03-11 | Discharge: 2014-03-11 | Disposition: A | Discharge status: Home / Self Care | Payer: Medicare Other | Source: Ambulatory Visit | Attending: Hematology and Oncology | Admitting: Hematology and Oncology

## 2014-03-11 ENCOUNTER — Other Ambulatory Visit: Payer: Self-pay | Admitting: Hematology and Oncology

## 2014-03-11 DIAGNOSIS — R928 Other abnormal and inconclusive findings on diagnostic imaging of breast: Secondary | ICD-10-CM | POA: Diagnosis present

## 2014-03-11 DIAGNOSIS — R921 Mammographic calcification found on diagnostic imaging of breast: Secondary | ICD-10-CM

## 2014-03-18 ENCOUNTER — Ambulatory Visit
Admission: RE | Admit: 2014-03-18 | Discharge: 2014-03-18 | Disposition: A | Discharge status: Home / Self Care | Payer: Medicare Other | Source: Ambulatory Visit | Attending: Hematology and Oncology | Admitting: Hematology and Oncology

## 2014-03-18 DIAGNOSIS — R921 Mammographic calcification found on diagnostic imaging of breast: Secondary | ICD-10-CM

## 2014-03-19 ENCOUNTER — Telehealth (HOSPITAL_COMMUNITY): Payer: Self-pay

## 2014-03-20 ENCOUNTER — Telehealth (HOSPITAL_COMMUNITY): Payer: Self-pay

## 2014-03-20 NOTE — Telephone Encounter (Signed)
Message copied by Mellissa Kohut on Thu Mar 20, 2014 10:46 AM ------      Message from: Farrel Gobble A      Created: Wed Mar 19, 2014 10:38 AM       Please call the patient and tell her that her breast biopsy done yesterday was benign, no evidence of malignancy. Thanks ------

## 2014-03-20 NOTE — Telephone Encounter (Signed)
Patient notified

## 2014-04-09 ENCOUNTER — Other Ambulatory Visit (HOSPITAL_COMMUNITY): Payer: Self-pay | Admitting: Respiratory Therapy

## 2014-04-09 DIAGNOSIS — R0600 Dyspnea, unspecified: Secondary | ICD-10-CM

## 2014-04-10 ENCOUNTER — Other Ambulatory Visit (HOSPITAL_COMMUNITY): Payer: Self-pay | Admitting: Family Medicine

## 2014-04-10 ENCOUNTER — Ambulatory Visit (HOSPITAL_COMMUNITY)
Admission: RE | Admit: 2014-04-10 | Discharge: 2014-04-10 | Disposition: A | Discharge status: Home / Self Care | Payer: Medicare Other | Source: Ambulatory Visit | Attending: Family Medicine | Admitting: Family Medicine

## 2014-04-10 DIAGNOSIS — R0602 Shortness of breath: Secondary | ICD-10-CM | POA: Diagnosis not present

## 2014-04-10 DIAGNOSIS — Z856 Personal history of leukemia: Secondary | ICD-10-CM | POA: Diagnosis not present

## 2014-04-10 DIAGNOSIS — R0600 Dyspnea, unspecified: Secondary | ICD-10-CM | POA: Diagnosis present

## 2014-04-10 DIAGNOSIS — Z853 Personal history of malignant neoplasm of breast: Secondary | ICD-10-CM | POA: Insufficient documentation

## 2014-04-10 LAB — BLOOD GAS, ARTERIAL
ACID-BASE DEFICIT: 4.6 mmol/L — AB (ref 0.0–2.0)
Bicarbonate: 18.5 mEq/L — ABNORMAL LOW (ref 20.0–24.0)
FIO2: 21 %
O2 SAT: 96.6 %
PATIENT TEMPERATURE: 37
TCO2: 16 mmol/L (ref 0–100)
pCO2 arterial: 25.6 mmHg — ABNORMAL LOW (ref 35.0–45.0)
pH, Arterial: 7.471 — ABNORMAL HIGH (ref 7.350–7.450)
pO2, Arterial: 81 mmHg (ref 80.0–100.0)

## 2014-06-23 ENCOUNTER — Other Ambulatory Visit (HOSPITAL_COMMUNITY): Payer: Self-pay

## 2014-06-24 ENCOUNTER — Encounter (HOSPITAL_COMMUNITY): Payer: Medicare Other | Attending: Hematology & Oncology

## 2014-06-24 ENCOUNTER — Telehealth (HOSPITAL_COMMUNITY): Payer: Self-pay | Admitting: *Deleted

## 2014-06-24 DIAGNOSIS — C50911 Malignant neoplasm of unspecified site of right female breast: Secondary | ICD-10-CM | POA: Insufficient documentation

## 2014-06-24 DIAGNOSIS — G43909 Migraine, unspecified, not intractable, without status migrainosus: Secondary | ICD-10-CM | POA: Diagnosis not present

## 2014-06-24 DIAGNOSIS — C9110 Chronic lymphocytic leukemia of B-cell type not having achieved remission: Secondary | ICD-10-CM | POA: Insufficient documentation

## 2014-06-24 DIAGNOSIS — C50311 Malignant neoplasm of lower-inner quadrant of right female breast: Secondary | ICD-10-CM

## 2014-06-24 LAB — CBC WITH DIFFERENTIAL/PLATELET
BASOS PCT: 0 % (ref 0–1)
Basophils Absolute: 0.1 10*3/uL (ref 0.0–0.1)
Eosinophils Absolute: 0.3 10*3/uL (ref 0.0–0.7)
Eosinophils Relative: 1 % (ref 0–5)
HCT: 41.6 % (ref 36.0–46.0)
Hemoglobin: 13.6 g/dL (ref 12.0–15.0)
Lymphocytes Relative: 91 % — ABNORMAL HIGH (ref 12–46)
Lymphs Abs: 55.6 10*3/uL — ABNORMAL HIGH (ref 0.7–4.0)
MCH: 29.5 pg (ref 26.0–34.0)
MCHC: 32.7 g/dL (ref 30.0–36.0)
MCV: 90.2 fL (ref 78.0–100.0)
MONOS PCT: 0 % — AB (ref 3–12)
Monocytes Absolute: 0.1 10*3/uL (ref 0.1–1.0)
NEUTROS ABS: 5.3 10*3/uL (ref 1.7–7.7)
NEUTROS PCT: 9 % — AB (ref 43–77)
PLATELETS: 230 10*3/uL (ref 150–400)
RBC: 4.61 MIL/uL (ref 3.87–5.11)
RDW: 14.4 % (ref 11.5–15.5)
WBC: 61.3 10*3/uL — AB (ref 4.0–10.5)

## 2014-06-24 LAB — RETICULOCYTES
RBC.: 4.61 MIL/uL (ref 3.87–5.11)
RETIC COUNT ABSOLUTE: 41.5 10*3/uL (ref 19.0–186.0)
Retic Ct Pct: 0.9 % (ref 0.4–3.1)

## 2014-06-24 LAB — COMPREHENSIVE METABOLIC PANEL
ALBUMIN: 4 g/dL (ref 3.5–5.2)
ALK PHOS: 63 U/L (ref 39–117)
ALT: 16 U/L (ref 0–35)
AST: 20 U/L (ref 0–37)
Anion gap: 9 (ref 5–15)
BUN: 17 mg/dL (ref 6–23)
CO2: 20 mmol/L (ref 19–32)
Calcium: 9 mg/dL (ref 8.4–10.5)
Chloride: 111 mmol/L (ref 96–112)
Creatinine, Ser: 0.79 mg/dL (ref 0.50–1.10)
GFR calc Af Amer: >90 mL/min (ref >90)
GFR calc non Af Amer: 78 mL/min — ABNORMAL LOW (ref >90)
Glucose, Bld: 132 mg/dL — ABNORMAL HIGH (ref 70–99)
POTASSIUM: 3.6 mmol/L (ref 3.5–5.1)
Sodium: 140 mmol/L (ref 135–145)
TOTAL PROTEIN: 6.8 g/dL (ref 6.0–8.3)
Total Bilirubin: 0.8 mg/dL (ref 0.3–1.2)

## 2014-06-24 LAB — LACTATE DEHYDROGENASE: LDH: 139 U/L (ref 94–250)

## 2014-06-24 NOTE — Telephone Encounter (Signed)
..  CRITICAL VALUE ALERT Critical value received:  WBC--61.34 Date of notification:  06/24/2014 Time of notification: 4268 Critical value read back:  Yes.   Nurse who received alert:  Kurtis Bushman RN MD notified (1st page):  S.[place]

## 2014-06-24 NOTE — Progress Notes (Signed)
Labs for b32m,ca2729,ldh,retic,cmp,cbcd

## 2014-06-25 ENCOUNTER — Encounter (HOSPITAL_BASED_OUTPATIENT_CLINIC_OR_DEPARTMENT_OTHER): Payer: Medicare Other | Admitting: Hematology & Oncology

## 2014-06-25 ENCOUNTER — Encounter (HOSPITAL_COMMUNITY): Payer: Self-pay | Admitting: Hematology & Oncology

## 2014-06-25 VITALS — BP 119/105 | HR 88 | Temp 98.1°F | Resp 18 | Wt 181.5 lb

## 2014-06-25 DIAGNOSIS — Z8579 Personal history of other malignant neoplasms of lymphoid, hematopoietic and related tissues: Secondary | ICD-10-CM

## 2014-06-25 DIAGNOSIS — C9110 Chronic lymphocytic leukemia of B-cell type not having achieved remission: Secondary | ICD-10-CM

## 2014-06-25 DIAGNOSIS — Z853 Personal history of malignant neoplasm of breast: Secondary | ICD-10-CM

## 2014-06-25 DIAGNOSIS — R519 Headache, unspecified: Secondary | ICD-10-CM

## 2014-06-25 DIAGNOSIS — C50911 Malignant neoplasm of unspecified site of right female breast: Secondary | ICD-10-CM

## 2014-06-25 DIAGNOSIS — R51 Headache: Secondary | ICD-10-CM

## 2014-06-25 LAB — BETA 2 MICROGLOBULIN, SERUM: Beta-2 Microglobulin: 2.4 mg/L — ABNORMAL HIGH (ref 0.6–2.4)

## 2014-06-25 LAB — CANCER ANTIGEN 27.29: CA 27.29: 27.3 U/mL (ref 0.0–38.6)

## 2014-06-25 NOTE — Assessment & Plan Note (Signed)
History of CLL dating back many years. Counts continue to remain stable. CBC reveals a normal hemoglobin, hematocrit, and platelet count. She has no B symptoms. Plan will be for ongoing observation

## 2014-06-25 NOTE — Progress Notes (Signed)
[name], MD [place] [address]    DIAGNOSIS:  #1 Locally advanced right breast cancer, no evidence of disease pending today's lab reports, currently taking Megace having undergone neoadjuvant chemotherapy followed by right modified radical mastectomy followed by external beam radiotherapy followed by Megace which she continues to take since 1999.  #2. Chronic lymphocytic leukemia for the last 24 years, never having been treated.  #3. Abnormal mammogram of the left breast in October 2015 status post stereotactic biopsy. Pathology revealed a benign fibroadenoma.   CURRENT THERAPY: Observation  INTERVAL HISTORY: [name] 79 y.o. female returns for follow-up of CLL which she has had for many years and history of R breast cancer dating back to 1999.   She takes megace which she states she started in 1999. She reports at the time of her breast cancer diagnosis Premarin was discontinued and she was put on Megace for hot flashes. Last May she notes a letter from her insurance carrier that reported the medication was high risk. She was eventually taken off of it. She had significant worsening of her hot flashes and was started back on up by Dr. Stephenie Acres and she is not interested in discontinuing it.  Her concern today is headaches. She reports a history of chronic headaches but they have improved over the years. She had migraines in the past but had her last one in 1994. She notes she has had several recently upon wakening in the morning. She also had one several days ago that she states simply would not leave. At times she can take 2 aspirin and drink a cup of coffee and the headaches go away but this was not the case with her headache the other day. She also reports having new headaches with a sensory change on the left scalp that she reports feels like "worms crawling in her skin" and then notes a headache occurs after this sensation. She denies any  visual changes, speech changes nor focal weakness.  MEDICAL HISTORY: Past Medical History  Diagnosis Date  . CLL (chronic lymphocytic leukemia) 07/21/2011  . Breast cancer, stage 3 07/21/2011  . Bronchitis     has HYPERLIPIDEMIA; ANXIETY; DEPRESSION; SLEEP APNEA, OBSTRUCTIVE, SEVERE; ALLERGIC RHINITIS; ARTHRITIS; ARTHRITIS, RIGHT HIP; HIP PAIN, RIGHT; LOW BACK PAIN; OSTEOPENIA; FATIGUE; BREAST CANCER, HX OF; HX, PERSONAL, LEUKEMIA NOS; MIGRAINES, HX OF; CLL (chronic lymphocytic leukemia); Breast cancer, stage 3; and Mass of arm on her problem list.     is allergic to codeine.  Ms. Schlosser does not currently have medications on file.  SURGICAL HISTORY: Past Surgical History  Procedure Laterality Date  . Appendectomy    . Abdominal hysterectomy    . Cholecystectomy    . Mastectomy      right  . Total hip arthroplasty      right    SOCIAL HISTORY: History   Social History  . Marital Status: Widowed    Spouse Name: N/A    Number of Children: N/A  . Years of Education: 11   Occupational History  . Not on file.   Social History Main Topics  . Smoking status: Never Smoker   . Smokeless tobacco: Never Used  . Alcohol Use: No  . Drug Use: No  . Sexual Activity: Not Currently   Other Topics Concern  . Not on file   Social History Narrative    FAMILY HISTORY: Family History  Problem Relation Age of Onset  . Cancer    .  Cancer Brother     Review of Systems  Constitutional: Negative.   Eyes: Negative.   Respiratory: Negative.   Cardiovascular: Negative.   Gastrointestinal: Negative.   Genitourinary: Negative.   Musculoskeletal:       R chest wall pain, intermittent. Improved by rubbing  Skin: Negative.   Neurological: Positive for sensory change and headaches.       L sided head sensory change prior to some headaches, describes it as worms crawling  Endo/Heme/Allergies: Negative.   Psychiatric/Behavioral: Negative.     PHYSICAL EXAMINATION  ECOG  PERFORMANCE STATUS: 0 - Asymptomatic  Filed Vitals:   06/25/14 0944  BP: 119/105  Pulse: 88  Temp: 98.1 F (36.7 C)  Resp: 18    Physical Exam  Constitutional: She is oriented to person, place, and time and well-developed, well-nourished, and in no distress.  Needs minor assistance onto exam table  HENT:  Head: Normocephalic and atraumatic.  Nose: Nose normal.  Mouth/Throat: Oropharynx is clear and moist. No oropharyngeal exudate.  Eyes: Conjunctivae and EOM are normal. Pupils are equal, round, and reactive to light. Right eye exhibits no discharge. Left eye exhibits no discharge. No scleral icterus.  Neck: Normal range of motion. Neck supple. No tracheal deviation present. No thyromegaly present.  Cardiovascular: Normal rate, regular rhythm and normal heart sounds.  Exam reveals no gallop and no friction rub.   No murmur heard. Pulmonary/Chest: Effort normal and breath sounds normal. She has no wheezes. She has no rales.  R chest wall exam benign. Scar tissue noted and restricted ROM noted. No palpable abnormality.  L breast exam without skin or nipple changs. No palpable masses. L axillae clear.  Abdominal: Soft. Bowel sounds are normal. She exhibits no distension and no mass. There is no tenderness. There is no rebound and no guarding.  Musculoskeletal: Normal range of motion. She exhibits no edema.  Lymphadenopathy:    She has no cervical adenopathy.  Neurological: She is alert and oriented to person, place, and time. She has normal reflexes. No cranial nerve deficit. Gait normal. Coordination normal.  Skin: Skin is warm and dry. No rash noted.  Psychiatric: Mood, memory, affect and judgment normal.  Nursing note and vitals reviewed.   LABORATORY DATA:  CBC    Component Value Date/Time   WBC 61.3* 06/24/2014 0944   WBC 65.2* 10/12/2012 1038   RBC 4.61 06/24/2014 0944   RBC 4.61 06/24/2014 0944   RBC 4.81 10/12/2012 1038   HGB 13.6 06/24/2014 0944   HGB 13.5 10/12/2012  1038   HCT 41.6 06/24/2014 0944   HCT 42.7 10/12/2012 1038   PLT 230 06/24/2014 0944   PLT 241 10/12/2012 1038   MCV 90.2 06/24/2014 0944   MCV 88.9 10/12/2012 1038   MCH 29.5 06/24/2014 0944   MCH 28.1 10/12/2012 1038   MCHC 32.7 06/24/2014 0944   MCHC 31.6 10/12/2012 1038   RDW 14.4 06/24/2014 0944   RDW 14.4 10/12/2012 1038   LYMPHSABS 55.6* 06/24/2014 0944   LYMPHSABS 57.9* 10/12/2012 1038   MONOABS 0.1 06/24/2014 0944   MONOABS 1.2* 10/12/2012 1038   EOSABS 0.3 06/24/2014 0944   EOSABS 0.4 10/12/2012 1038   BASOSABS 0.1 06/24/2014 0944   BASOSABS 0.3* 10/12/2012 1038   CMP     Component Value Date/Time   NA 140 06/24/2014 0944   NA 142 10/12/2012 1038   K 3.6 06/24/2014 0944   K 4.0 10/12/2012 1038   CL 111 06/24/2014 0944   CL 111* 10/12/2012  1038   CO2 20 06/24/2014 0944   CO2 19* 10/12/2012 1038   GLUCOSE 132* 06/24/2014 0944   GLUCOSE 104* 10/12/2012 1038   BUN 17 06/24/2014 0944   BUN 16.1 10/12/2012 1038   CREATININE 0.79 06/24/2014 0944   CREATININE 0.9 10/12/2012 1038   CALCIUM 9.0 06/24/2014 0944   CALCIUM 9.0 10/12/2012 1038   PROT 6.8 06/24/2014 0944   PROT 6.6 10/12/2012 1038   ALBUMIN 4.0 06/24/2014 0944   ALBUMIN 3.7 10/12/2012 1038   AST 20 06/24/2014 0944   AST 20 10/12/2012 1038   ALT 16 06/24/2014 0944   ALT 20 10/12/2012 1038   ALKPHOS 63 06/24/2014 0944   ALKPHOS 67 10/12/2012 1038   BILITOT 0.8 06/24/2014 0944   BILITOT 0.57 10/12/2012 1038   GFRNONAA 78* 06/24/2014 0944   GFRAA >90 06/24/2014 0944       ASSESSMENT and THERAPY PLAN:    Breast cancer, stage 3 Pleasant 79 year old female with a history of locally advanced carcinoma of the right breast physical exam today is unremarkable she has no obvious evidence of recurrence. She is up-to-date on her screening mammograms and will be due again in October. Notably last year the left mammogram was abnormal and she underwent a stereotactic biopsy. Pathology was remarkable for a  fibroadenoma.   CLL (chronic lymphocytic leukemia) History of CLL dating back many years. Counts continue to remain stable. CBC reveals a normal hemoglobin, hematocrit, and platelet count. She has no B symptoms. Plan will be for ongoing observation   MIGRAINES, HX OF 79 year old with a history of migraines although she states she has not had one in many years. She describes new onset headaches that are worsening. She is had several in the morning on awakening. (Please see history of present illness for details) we discussed options and how to proceed in regards to her headaches today and I have recommended a head MRI. If that study is normal we discussed referring her to neurology for additional evaluation and she agrees. I will keep her apprised of results of her head MRI when it becomes available.     All questions were answered. The patient knows to call the clinic with any problems, questions or concerns. We can certainly see the patient much sooner if necessary.   Molli Hazard 06/25/2014

## 2014-06-25 NOTE — Assessment & Plan Note (Signed)
79 year old with a history of migraines although she states she has not had one in many years. She describes new onset headaches that are worsening. She is had several in the morning on awakening. (Please see history of present illness for details) we discussed options and how to proceed in regards to her headaches today and I have recommended a head MRI. If that study is normal we discussed referring her to neurology for additional evaluation and she agrees. I will keep her apprised of results of her head MRI when it becomes available.

## 2014-06-25 NOTE — Assessment & Plan Note (Signed)
Pleasant 79 year old female with a history of locally advanced carcinoma of the right breast physical exam today is unremarkable she has no obvious evidence of recurrence. She is up-to-date on her screening mammograms and will be due again in October. Notably last year the left mammogram was abnormal and she underwent a stereotactic biopsy. Pathology was remarkable for a fibroadenoma.

## 2014-06-25 NOTE — Patient Instructions (Signed)
[place] at [facility] Discharge Instructions  RECOMMENDATIONS MADE BY THE CONSULTANT AND ANY TEST RESULTS WILL BE SENT TO YOUR REFERRING PHYSICIAN.  We will see you in 6 months for follow up and repeat lab work. MRI of the brain as scheduled.  Prescription given for breast prosthesis.  Please call for any questions or concerns.    Thank you for choosing [place] at [facility] to provide your oncology and hematology care.  To afford each patient quality time with our provider, please arrive at least 15 minutes before your scheduled appointment time.    You need to re-schedule your appointment should you arrive 10 or more minutes late.  We strive to give you quality time with our providers, and arriving late affects you and other patients whose appointments are after yours.  Also, if you no show three or more times for appointments you may be dismissed from the clinic at the providers discretion.     Again, thank you for choosing [facility].  Our hope is that these requests will decrease the amount of time that you wait before being seen by our physicians.       _____________________________________________________________  Should you have questions after your visit to [facility], please contact our office at [phone] between the hours of 8:30 a.m. and 4:30 p.m.  Voicemails left after 4:30 p.m. will not be returned until the following business day.  For prescription refill requests, have your pharmacy contact our office.

## 2014-07-09 ENCOUNTER — Ambulatory Visit (HOSPITAL_COMMUNITY)
Admission: RE | Admit: 2014-07-09 | Discharge: 2014-07-09 | Disposition: A | Discharge status: Home / Self Care | Payer: Medicare Other | Source: Ambulatory Visit | Attending: Hematology & Oncology | Admitting: Hematology & Oncology

## 2014-07-09 DIAGNOSIS — R519 Headache, unspecified: Secondary | ICD-10-CM

## 2014-07-09 DIAGNOSIS — R51 Headache: Secondary | ICD-10-CM | POA: Insufficient documentation

## 2014-07-09 DIAGNOSIS — C50911 Malignant neoplasm of unspecified site of right female breast: Secondary | ICD-10-CM | POA: Diagnosis not present

## 2014-07-09 MED ORDER — GADOBENATE DIMEGLUMINE 529 MG/ML IV SOLN
15.0000 mL | Freq: Once | INTRAVENOUS | Status: AC | PRN
  Administered 2014-07-09: 15 mL via INTRAVENOUS

## 2014-07-10 ENCOUNTER — Encounter (HOSPITAL_COMMUNITY): Payer: Self-pay | Admitting: Lab

## 2014-07-10 NOTE — Progress Notes (Signed)
Referral sent to Dr Merlene Laughter.  Records faxed on 2/18

## 2014-07-14 ENCOUNTER — Encounter (INDEPENDENT_AMBULATORY_CARE_PROVIDER_SITE_OTHER): Payer: Self-pay | Admitting: *Deleted

## 2014-07-20 ENCOUNTER — Encounter (HOSPITAL_COMMUNITY): Payer: Self-pay | Admitting: Emergency Medicine

## 2014-07-20 ENCOUNTER — Emergency Department (HOSPITAL_COMMUNITY)
Admission: EM | Admit: 2014-07-20 | Discharge: 2014-07-20 | Disposition: A | Discharge status: Home / Self Care | Payer: Medicare Other | Attending: Emergency Medicine | Admitting: Emergency Medicine

## 2014-07-20 DIAGNOSIS — Z853 Personal history of malignant neoplasm of breast: Secondary | ICD-10-CM | POA: Insufficient documentation

## 2014-07-20 DIAGNOSIS — B029 Zoster without complications: Secondary | ICD-10-CM | POA: Insufficient documentation

## 2014-07-20 DIAGNOSIS — M5416 Radiculopathy, lumbar region: Secondary | ICD-10-CM | POA: Insufficient documentation

## 2014-07-20 DIAGNOSIS — R109 Unspecified abdominal pain: Secondary | ICD-10-CM | POA: Diagnosis not present

## 2014-07-20 DIAGNOSIS — Z7982 Long term (current) use of aspirin: Secondary | ICD-10-CM | POA: Insufficient documentation

## 2014-07-20 DIAGNOSIS — Z856 Personal history of leukemia: Secondary | ICD-10-CM | POA: Diagnosis not present

## 2014-07-20 DIAGNOSIS — Z8709 Personal history of other diseases of the respiratory system: Secondary | ICD-10-CM | POA: Insufficient documentation

## 2014-07-20 DIAGNOSIS — Z79899 Other long term (current) drug therapy: Secondary | ICD-10-CM | POA: Insufficient documentation

## 2014-07-20 DIAGNOSIS — M545 Low back pain: Secondary | ICD-10-CM | POA: Diagnosis present

## 2014-07-20 MED ORDER — TRAMADOL HCL 50 MG PO TABS: 50.0000 mg | ORAL_TABLET | Freq: Four times a day (QID) | ORAL | Status: DC | PRN

## 2014-07-20 MED ORDER — TRAMADOL HCL 50 MG PO TABS
50.0000 mg | ORAL_TABLET | Freq: Once | ORAL | Status: AC
  Administered 2014-07-20: 50 mg via ORAL
  Filled 2014-07-20: qty 1

## 2014-07-20 NOTE — ED Notes (Signed)
Pt reports abdominal pain with radiation. Pt has rash on her abd. Reports pain feels like burning needles.

## 2014-07-20 NOTE — ED Provider Notes (Addendum)
CSN: [id number]     Arrival date & time 07/20/14  1731 History  This chart was scribed for Fredia Sorrow, MD by Hilda Lias, ED Scribe. This patient was seen in room APA10/APA10 and the patient's care was started at 8:11 PM.    Chief Complaint  Patient presents with  . Herpes Zoster  . Abdominal Pain      The history is provided by the patient. No language interpreter was used.     HPI Comments: [name] is a 79 y.o. female who presents to the Emergency Department complaining of constant topical pain in the lower abdominal area from her umbilicus across the left part of her abdomen to her lower back that she rates as a 10/10 in pain severity that has been present for 4 days. Pt states that the pain has been constant for 4 days with it worsening earlier today, which prompted her ED visit. Pt states that the pain feels like burning needles, and feels like it is on the surface of the skin. Pt denies any known injury to the area, and has concerns that it may be shingles. Pt is unsure if she has had chicken pox in the past or not.     Past Medical History  Diagnosis Date  . CLL (chronic lymphocytic leukemia) 07/21/2011  . Breast cancer, stage 3 07/21/2011  . Bronchitis    Past Surgical History  Procedure Laterality Date  . Appendectomy    . Abdominal hysterectomy    . Cholecystectomy    . Mastectomy      right  . Total hip arthroplasty      right   Family History  Problem Relation Age of Onset  . Cancer    . Cancer Brother    History  Substance Use Topics  . Smoking status: Never Smoker   . Smokeless tobacco: Never Used  . Alcohol Use: No   OB History    No data available     Review of Systems  Constitutional: Negative for fever.  HENT: Negative for congestion and rhinorrhea.   Eyes: Negative for photophobia and visual disturbance.  Respiratory: Negative for shortness of breath.   Cardiovascular: Negative for leg swelling.  Gastrointestinal: Positive for  abdominal pain.  Genitourinary: Negative for dysuria.  Musculoskeletal: Positive for back pain.  Skin: Positive for rash.  Neurological: Negative for headaches.  Hematological: Does not bruise/bleed easily.  Psychiatric/Behavioral: Negative for confusion.      Allergies  Codeine  Home Medications   Prior to Admission medications   Medication Sig Start Date End Date Taking? Authorizing Provider  albuterol (PROVENTIL) (2.5 MG/3ML) 0.083% nebulizer solution Take 2.5 mg by nebulization every 4 (four) hours as needed for wheezing or shortness of breath.   Yes Historical Provider, MD  aspirin 81 MG tablet Take 81 mg by mouth at bedtime.    Yes Historical Provider, MD  megestrol (MEGACE) 40 MG tablet Take 1 tablet (40 mg total) by mouth daily. Patient taking differently: Take 40 mg by mouth at bedtime.  12/25/13  Yes Farrel Gobble, MD  omeprazole (PRILOSEC) 20 MG capsule Take 20 mg by mouth at bedtime.    Yes Historical Provider, MD  albuterol (PROVENTIL HFA;VENTOLIN HFA) 108 (90 BASE) MCG/ACT inhaler Inhale 2 puffs into the lungs every 6 (six) hours as needed for shortness of breath.     Historical Provider, MD  traMADol (ULTRAM) 50 MG tablet Take 1 tablet (50 mg total) by mouth every 6 (six) hours as  needed. 07/20/14   Fredia Sorrow, MD   BP 117/55 mmHg  Pulse 75  Temp(Src) 97.7 F (36.5 C) (Oral)  Ht 5\' 2"  (1.575 m)  Wt 181 lb (82.101 kg)  BMI 33.10 kg/m2  SpO2 96% Physical Exam  Constitutional: She is oriented to person, place, and time. She appears well-developed and well-nourished.  HENT:  Head: Normocephalic and atraumatic.  Eyes: Conjunctivae and EOM are normal. Pupils are equal, round, and reactive to light.  Neck: Normal range of motion. Neck supple.  Cardiovascular: Normal rate, regular rhythm and normal heart sounds.   No murmur heard. Pulmonary/Chest: Effort normal and breath sounds normal. No respiratory distress.  Abdominal: Soft. There is no tenderness.   Musculoskeletal: She exhibits no edema.  No swelling in ankles  Neurological: She is alert and oriented to person, place, and time. No cranial nerve deficit. She exhibits normal muscle tone. Coordination normal.  Skin: Skin is warm. No rash noted.  L1 area of abdomen no rash but painful  T8 level of abdomen: two red spots spanning 5cm in length T8 level on back: one red spot 3 cm in length  T10 level: three red spots spanning 8 cm in length     ED Course  Procedures (including critical care time)  DIAGNOSTIC STUDIES: Oxygen Saturation is 100% on RA, normal by my interpretation.    COORDINATION OF CARE: 8:23 PM Discussed treatment plan with pt at bedside and pt agreed to plan.   Labs Review Labs Reviewed - No data to display  Imaging Review No results found.   EKG Interpretation None      MDM   Final diagnoses:  Lumbar radicular pain   Patient with pain along the L2 lumbar distribution that could be radicular pain related to the upper a Herpes zoster. But there is no evidence of rash. Patient's had the pain in that area for the past 4 days. Was not inconsistent with that perhaps developing. Patient does have some rash on higher levels sort of T8 T10 but nowhere close to the place where the pain is. Nose rash areas are not vesicular. Kilos or not related to herpes zoster.    I personally performed the services described in this documentation, which was scribed in my presence. The recorded information has been reviewed and is accurate.    Fredia Sorrow, MD 07/20/14 9924  Fredia Sorrow, MD 07/20/14 2100

## 2014-07-20 NOTE — Discharge Instructions (Signed)
The dermatome pain that you have on the left lumbar area could be the pain before the the breakout of a herpes zoster's rash. However the few rash bumps that you have higher up would not be consistent with herpes zoster currently because the pain and the rash did not coincide. If you breakout with a rash we have the pain it's important to get seen either by your doctor returning to the emergency department. In the meantime take the tramadol to help with the pain.

## 2014-09-04 ENCOUNTER — Other Ambulatory Visit: Payer: Self-pay | Admitting: Neurology

## 2014-09-04 DIAGNOSIS — R519 Headache, unspecified: Secondary | ICD-10-CM

## 2014-09-04 DIAGNOSIS — R51 Headache: Principal | ICD-10-CM

## 2014-09-10 ENCOUNTER — Ambulatory Visit (HOSPITAL_COMMUNITY)
Admission: RE | Admit: 2014-09-10 | Discharge: 2014-09-10 | Disposition: A | Discharge status: Home / Self Care | Payer: Medicare Other | Source: Ambulatory Visit | Attending: Neurology | Admitting: Neurology

## 2014-09-10 ENCOUNTER — Other Ambulatory Visit (HOSPITAL_COMMUNITY)
Admission: RE | Admit: 2014-09-10 | Discharge: 2014-09-10 | Disposition: A | Discharge status: Home / Self Care | Payer: Medicare Other | Source: Ambulatory Visit | Attending: Neurology | Admitting: Neurology

## 2014-09-10 ENCOUNTER — Ambulatory Visit (HOSPITAL_COMMUNITY)
Admission: RE | Admit: 2014-09-10 | Discharge: 2014-09-10 | Disposition: A | Discharge status: Home / Self Care | Payer: Medicare Other | Source: Ambulatory Visit | Attending: Diagnostic Radiology | Admitting: Diagnostic Radiology

## 2014-09-10 ENCOUNTER — Encounter (HOSPITAL_COMMUNITY): Payer: Self-pay

## 2014-09-10 DIAGNOSIS — R51 Headache: Secondary | ICD-10-CM | POA: Insufficient documentation

## 2014-09-10 DIAGNOSIS — R519 Headache, unspecified: Secondary | ICD-10-CM

## 2014-09-10 LAB — CSF CELL COUNT WITH DIFFERENTIAL
RBC COUNT CSF: 34 /mm3 — AB
Tube #: 3
WBC CSF: 1 /mm3 (ref 0–5)

## 2014-09-10 LAB — CRYPTOCOCCAL ANTIGEN, CSF: CRYPTO AG: NEGATIVE

## 2014-09-10 LAB — PROTEIN, CSF: Total  Protein, CSF: 50 mg/dL — ABNORMAL HIGH (ref 15–45)

## 2014-09-10 LAB — GLUCOSE, CSF: Glucose, CSF: 59 mg/dL (ref 43–76)

## 2014-09-10 MED ORDER — ACETAMINOPHEN 325 MG PO TABS: 650.0000 mg | ORAL_TABLET | ORAL | Status: DC | PRN

## 2014-09-10 NOTE — Procedures (Signed)
Preprocedure Dx: Intractable headaches Postprocedure Dx: Intractable headaches Procedure:  Fluoroscopically guided lumbar puncture Radiologist:  Thornton Papas Anesthesia:  3 ml of 1% lidocaine Specimen:  12 ml CSF, clear and colorless EBL:   < 1 ml Opening pressure: < 3 cm G4W (prone) Complications: None

## 2014-09-10 NOTE — Progress Notes (Signed)
Patient discharged home with no complaints of pain of headache. Instructions given

## 2014-09-10 NOTE — Progress Notes (Signed)
Patient arrived from radiology to day surgery after lumbar puncture with no complaints of pain or discomfort. Patient on monitor to be assessed for 4 hours.

## 2014-09-10 NOTE — Discharge Instructions (Signed)
Lumbar Puncture, Care After °Refer to this sheet in the next few weeks. These instructions provide you with information on caring for yourself after your procedure. Your health care provider may also give you more specific instructions. Your treatment has been planned according to current medical practices, but problems sometimes occur. Call your health care provider if you have any problems or questions after your procedure. °WHAT TO EXPECT AFTER THE PROCEDURE °After your procedure, it is typical to have the following sensations: °· Mild discomfort or pain at the insertion site. °· Mild headache that is relieved with pain medicines. °HOME CARE INSTRUCTIONS °· Avoid lifting anything heavier than 10 lb (4.5 kg) for at least 12 hours after the procedure. °· Drink enough fluids to keep your urine clear or pale yellow. °SEEK MEDICAL CARE IF: °· You have fever or chills. °· You have nausea or vomiting. °· You have a headache that lasts for more than 2 days. °SEEK IMMEDIATE MEDICAL CARE IF: °· You have any numbness or tingling in your legs. °· You are unable to control your bowel or bladder. °· You have bleeding or swelling in your back at the insertion site. °· You are dizzy or faint. °Document Released: 05/14/2013 Document Reviewed: 05/14/2013 °[employer]® Patient Information ©2015 [employer]. This information is not intended to replace advice given to you by your health care provider. Make sure you discuss any questions you have with your health care provider. ° °

## 2014-11-04 ENCOUNTER — Other Ambulatory Visit (HOSPITAL_COMMUNITY): Payer: Self-pay | Admitting: Hematology and Oncology

## 2014-12-24 ENCOUNTER — Encounter (HOSPITAL_COMMUNITY): Payer: Self-pay | Admitting: Hematology & Oncology

## 2014-12-24 ENCOUNTER — Telehealth (HOSPITAL_COMMUNITY): Payer: Self-pay

## 2014-12-24 ENCOUNTER — Encounter (HOSPITAL_BASED_OUTPATIENT_CLINIC_OR_DEPARTMENT_OTHER): Payer: Medicare Other | Admitting: Hematology & Oncology

## 2014-12-24 ENCOUNTER — Ambulatory Visit (HOSPITAL_COMMUNITY): Payer: Medicare Other | Admitting: Hematology & Oncology

## 2014-12-24 ENCOUNTER — Other Ambulatory Visit (HOSPITAL_COMMUNITY): Payer: Self-pay | Admitting: Hematology & Oncology

## 2014-12-24 ENCOUNTER — Encounter (HOSPITAL_COMMUNITY): Payer: Medicare Other | Attending: Hematology & Oncology

## 2014-12-24 VITALS — BP 137/83 | HR 83 | Temp 99.1°F | Resp 18 | Wt 182.0 lb

## 2014-12-24 DIAGNOSIS — Z1231 Encounter for screening mammogram for malignant neoplasm of breast: Secondary | ICD-10-CM

## 2014-12-24 DIAGNOSIS — C9110 Chronic lymphocytic leukemia of B-cell type not having achieved remission: Secondary | ICD-10-CM

## 2014-12-24 DIAGNOSIS — Z853 Personal history of malignant neoplasm of breast: Secondary | ICD-10-CM | POA: Diagnosis not present

## 2014-12-24 DIAGNOSIS — C50911 Malignant neoplasm of unspecified site of right female breast: Secondary | ICD-10-CM

## 2014-12-24 LAB — CBC WITH DIFFERENTIAL/PLATELET
BASOS PCT: 0 % (ref 0–1)
Basophils Absolute: 0.2 10*3/uL — ABNORMAL HIGH (ref 0.0–0.1)
EOS ABS: 0.2 10*3/uL (ref 0.0–0.7)
Eosinophils Relative: 0 % (ref 0–5)
HCT: 42.4 % (ref 36.0–46.0)
HEMOGLOBIN: 13.9 g/dL (ref 12.0–15.0)
LYMPHS ABS: 55.4 10*3/uL — AB (ref 0.7–4.0)
LYMPHS PCT: 88 % — AB (ref 12–46)
MCH: 29.8 pg (ref 26.0–34.0)
MCHC: 32.8 g/dL (ref 30.0–36.0)
MCV: 91 fL (ref 78.0–100.0)
MONO ABS: 1.2 10*3/uL — AB (ref 0.1–1.0)
MONOS PCT: 2 % — AB (ref 3–12)
Neutro Abs: 5.9 10*3/uL (ref 1.7–7.7)
Neutrophils Relative %: 9 % — ABNORMAL LOW (ref 43–77)
Platelets: 250 10*3/uL (ref 150–400)
RBC: 4.66 MIL/uL (ref 3.87–5.11)
RDW: 14.2 % (ref 11.5–15.5)
WBC: 62.9 10*3/uL — AB (ref 4.0–10.5)

## 2014-12-24 LAB — COMPREHENSIVE METABOLIC PANEL
ALK PHOS: 59 U/L (ref 38–126)
ALT: 16 U/L (ref 14–54)
AST: 22 U/L (ref 15–41)
Albumin: 4.1 g/dL (ref 3.5–5.0)
Anion gap: 12 (ref 5–15)
BUN: 17 mg/dL (ref 6–20)
CO2: 20 mmol/L — ABNORMAL LOW (ref 22–32)
Calcium: 9 mg/dL (ref 8.9–10.3)
Chloride: 108 mmol/L (ref 101–111)
Creatinine, Ser: 0.87 mg/dL (ref 0.44–1.00)
GFR calc Af Amer: >60 mL/min (ref >60)
GFR calc non Af Amer: >60 mL/min (ref >60)
GLUCOSE: 188 mg/dL — AB (ref 65–99)
Potassium: 3.9 mmol/L (ref 3.5–5.1)
SODIUM: 140 mmol/L (ref 135–145)
TOTAL PROTEIN: 6.9 g/dL (ref 6.5–8.1)
Total Bilirubin: 0.5 mg/dL (ref 0.3–1.2)

## 2014-12-24 LAB — LACTATE DEHYDROGENASE: LDH: 150 U/L (ref 98–192)

## 2014-12-24 NOTE — Patient Instructions (Addendum)
[place] at [facility] Discharge Instructions  RECOMMENDATIONS MADE BY THE CONSULTANT AND ANY TEST RESULTS WILL BE SENT TO YOUR REFERRING PHYSICIAN.  Return in 6 months for office visit. Mammogram as scheduled in October.  Thank you for choosing [place] at [facility] : [facility] to provide your oncology and hematology care.  To afford each patient quality time with our provider, please arrive at least 15 minutes before your scheduled appointment time.    You need to re-schedule your appointment should you arrive 10 or more minutes late.  We strive to give you quality time with our providers, and arriving late affects you and other patients whose appointments are after yours.  Also, if you no show three or more times for appointments you may be dismissed from the clinic at the providers discretion.     Again, thank you for choosing [facility].  Our hope is that these requests will decrease the amount of time that you wait before being seen by our physicians.       _____________________________________________________________  Should you have questions after your visit to [facility], please contact our office at [phone] between the hours of 8:30 a.m. and 4:30 p.m.  Voicemails left after 4:30 p.m. will not be returned until the following business day.  For prescription refill requests, have your pharmacy contact our office.

## 2014-12-24 NOTE — Progress Notes (Signed)
Veronica Bellow, MD [place] [address]    DIAGNOSIS:  #1 Locally advanced right breast cancer,  currently taking Megace having undergone neoadjuvant chemotherapy followed by right modified radical mastectomy followed by external beam radiotherapy followed by Megace which she continues to take since 1999.  #2. Chronic lymphocytic leukemia for the last 24 years, never having been treated.  #3. Abnormal mammogram of the left breast in October 2015 status post stereotactic biopsy. Pathology revealed a benign fibroadenoma.   CURRENT THERAPY: Observation  INTERVAL HISTORY: [name] 79 y.o. female returns for follow-up of CLL which she has had for many years and history of R breast cancer dating back to 1999.   She takes megace which she states she started in 1999. She reports at the time of her breast cancer diagnosis Premarin was discontinued and she was put on Megace for hot flashes. Last May she notes a letter from her insurance carrier that reported the medication was high risk. She was eventually taken off of it. She had significant worsening of her hot flashes and was started back on up by Dr. Stephenie Wallace and she is not interested in discontinuing it.  The patient is alone today and says that she has been doing well.  She denies any issues with her chest and breasts.  Her appetite and energy is well and she is active, walking around her neighborhood complex with her dog multiple times a day.   In February, she began to feel a pain in her left abdomen describing it as "pins and needles" and a burning sensation adjacent to it.  4 days later, she went to the emergency room and was diagnosed with shingles though she never developed a rash.  She states that from 08/18/14 to 09/21/14, she was in consistent pain.   Everything was alleviated with Gabapentin, administered by Dr. Merlene Wallace.  She has an appointment with him next week to address her headaches.  She has  a headache today from the weather.   No weight loss, fever, chills, or drenching night sweats.  MEDICAL HISTORY: Past Medical History  Diagnosis Date  . CLL (chronic lymphocytic leukemia) 07/21/2011  . Breast cancer, stage 3 07/21/2011  . Bronchitis     has HYPERLIPIDEMIA; ANXIETY; DEPRESSION; SLEEP APNEA, OBSTRUCTIVE, SEVERE; ALLERGIC RHINITIS; ARTHRITIS; ARTHRITIS, RIGHT HIP; HIP PAIN, RIGHT; LOW BACK PAIN; OSTEOPENIA; FATIGUE; BREAST CANCER, HX OF; HX, PERSONAL, LEUKEMIA NOS; MIGRAINES, HX OF; CLL (chronic lymphocytic leukemia); Breast cancer, stage 3; and Mass of arm on her problem list.     is allergic to codeine.  [name] does not currently have medications on file.  SURGICAL HISTORY: Past Surgical History  Procedure Laterality Date  . Appendectomy    . Abdominal hysterectomy    . Cholecystectomy    . Mastectomy      right  . Total hip arthroplasty      right    SOCIAL HISTORY: Social History   Social History  . Marital Status: Widowed    Spouse Name: N/A  . Number of Children: N/A  . Years of Education: 11   Occupational History  . Not on file.   Social History Main Topics  . Smoking status: Never Smoker   . Smokeless tobacco: Never Used  . Alcohol Use: No  . Drug Use: No  . Sexual Activity: Not Currently   Other Topics Concern  . Not on file   Social History Narrative    FAMILY HISTORY:  Family History  Problem Relation Age of Onset  . Cancer    . Cancer Brother     Review of Systems  Constitutional: Negative.   Eyes: Negative.   Respiratory: Negative.   Cardiovascular: Negative.   Gastrointestinal: Negative.   Genitourinary: Negative.   Musculoskeletal:       R chest wall pain, intermittent. Improved by rubbing  Skin: Negative.   Neurological: Positive for sensory change and headaches.       L sided head sensory change prior to some headaches, describes it as worms crawling  Endo/Heme/Allergies: Negative.   Psychiatric/Behavioral:  Negative.   14 point review of systems was performed and is negative except as detailed under history of present illness and above  PHYSICAL EXAMINATION  ECOG PERFORMANCE STATUS: 0 - Asymptomatic  Filed Vitals:   12/24/14 1047  BP: 137/83  Pulse: 83  Temp: 99.1 F (37.3 C)  Resp: 18    Physical Exam  Constitutional: She is oriented to person, place, and time and well-developed, well-nourished, and in no distress.  Needs minor assistance onto exam table  HENT:  Head: Normocephalic and atraumatic.  Nose: Nose normal.  Mouth/Throat: Oropharynx is clear and moist. No oropharyngeal exudate.  Eyes: Conjunctivae and EOM are normal. Pupils are equal, round, and reactive to light. Right eye exhibits no discharge. Left eye exhibits no discharge. No scleral icterus.  Neck: Normal range of motion. Neck supple. No tracheal deviation present. No thyromegaly present.  Cardiovascular: Normal rate, regular rhythm and normal heart sounds.  Exam reveals no gallop and no friction rub.   No murmur heard. Pulmonary/Chest: Effort normal and breath sounds normal. She has no wheezes. She has no rales.  R chest wall exam benign. Scar tissue noted and restricted ROM noted. No palpable abnormality.  L breast exam without skin or nipple changs. No palpable masses. L axillae clear.  Abdominal: Soft. Bowel sounds are normal. She exhibits no distension and no mass. There is no tenderness. There is no rebound and no guarding.  Musculoskeletal: Normal range of motion. She exhibits no edema.  Lymphadenopathy:    She has no cervical adenopathy.  Neurological: She is alert and oriented to person, place, and time. She has normal reflexes. No cranial nerve deficit. Gait normal. Coordination normal.  Skin: Skin is warm and dry. No rash noted.  Psychiatric: Mood, memory, affect and judgment normal.  Nursing note and vitals reviewed.   LABORATORY DATA:  CBC    Component Value Date/Time   WBC 62.9* 12/24/2014 1008     WBC 65.2* 10/12/2012 1038   RBC 4.66 12/24/2014 1008   RBC 4.61 06/24/2014 0944   RBC 4.81 10/12/2012 1038   HGB 13.9 12/24/2014 1008   HGB 13.5 10/12/2012 1038   HCT 42.4 12/24/2014 1008   HCT 42.7 10/12/2012 1038   PLT 250 12/24/2014 1008   PLT 241 10/12/2012 1038   MCV 91.0 12/24/2014 1008   MCV 88.9 10/12/2012 1038   MCH 29.8 12/24/2014 1008   MCH 28.1 10/12/2012 1038   MCHC 32.8 12/24/2014 1008   MCHC 31.6 10/12/2012 1038   RDW 14.2 12/24/2014 1008   RDW 14.4 10/12/2012 1038   LYMPHSABS 55.4* 12/24/2014 1008   LYMPHSABS 57.9* 10/12/2012 1038   MONOABS 1.2* 12/24/2014 1008   MONOABS 1.2* 10/12/2012 1038   EOSABS 0.2 12/24/2014 1008   EOSABS 0.4 10/12/2012 1038   BASOSABS 0.2* 12/24/2014 1008   BASOSABS 0.3* 10/12/2012 1038   CMP     Component Value Date/Time  NA 140 12/24/2014 1008   NA 142 10/12/2012 1038   K 3.9 12/24/2014 1008   K 4.0 10/12/2012 1038   CL 108 12/24/2014 1008   CL 111* 10/12/2012 1038   CO2 20* 12/24/2014 1008   CO2 19* 10/12/2012 1038   GLUCOSE 188* 12/24/2014 1008   GLUCOSE 104* 10/12/2012 1038   BUN 17 12/24/2014 1008   BUN 16.1 10/12/2012 1038   CREATININE 0.87 12/24/2014 1008   CREATININE 0.9 10/12/2012 1038   CALCIUM 9.0 12/24/2014 1008   CALCIUM 9.0 10/12/2012 1038   PROT 6.9 12/24/2014 1008   PROT 6.6 10/12/2012 1038   ALBUMIN 4.1 12/24/2014 1008   ALBUMIN 3.7 10/12/2012 1038   AST 22 12/24/2014 1008   AST 20 10/12/2012 1038   ALT 16 12/24/2014 1008   ALT 20 10/12/2012 1038   ALKPHOS 59 12/24/2014 1008   ALKPHOS 67 10/12/2012 1038   BILITOT 0.5 12/24/2014 1008   BILITOT 0.57 10/12/2012 1038   GFRNONAA >60 12/24/2014 1008   GFRAA >60 12/24/2014 1008    ASSESSMENT and THERAPY PLAN:  CLL, diagnosed in 1999 Locally advanced carcinoma of the right breast status post mastectomy and radiation, 1999 Ongoing Megace for hot flashes with patient refusal to discontinue Shingles in early 2016  She is doing very well. She has no  evidence of obvious recurrence of her breast cancer and remains disease-free. Her CLL is remarkably stable with no B symptoms and no obvious evidence of symptomatic progression requiring therapy. We will see her back in 6 months with repeat laboratory studies and physical exam. She has any further questions or concerns prior to follow-up she is advised to let us know.  Mammogram was ordered.  Orders Placed This Encounter  Procedures  . MM Digital Screening Unilat L    Standing Status: Future     Number of Occurrences:      Standing Expiration Date: 12/24/2015    Order Specific Question:  Reason for Exam (SYMPTOM  OR DIAGNOSIS REQUIRED)    Answer:  screening, history of breast ca on the Right    Order Specific Question:  Preferred imaging location?    Answer:  [facility]  . CBC with Differential    Standing Status: Future     Number of Occurrences:      Standing Expiration Date: 12/24/2015  . Comprehensive metabolic panel    Standing Status: Future     Number of Occurrences:      Standing Expiration Date: 12/24/2015  . Lactate dehydrogenase    Standing Status: Future     Number of Occurrences:      Standing Expiration Date: 12/24/2015    All questions were answered. The patient knows to call the clinic with any problems, questions or concerns. We can certainly see the patient much sooner if necessary. //  This document serves as a record of services personally performed by Ancil Linsey, MD. It was created on her behalf by Janace Hoard, a trained medical scribe. The creation of this record is based on the scribe's personal observations and the provider's statements to them. This document has been checked and approved by the attending provider.  I have reviewed the above documentation for accuracy and completeness, and I agree with the above.  This note was electronically signed.  Kelby Fam. Whitney Muse, MD

## 2014-12-24 NOTE — Telephone Encounter (Signed)
CRITICAL VALUE ALERT Critical value received:  WBC 62,900 Date of notification:  12/24/14  Time of notification: 5927  Critical value read back:  Yes.   Nurse who received alert:  Mickie Kay, RN MD notified :  Dr. Whitney Muse at [phone]

## 2014-12-24 NOTE — Progress Notes (Signed)
LABS DRAWN

## 2014-12-25 ENCOUNTER — Other Ambulatory Visit (HOSPITAL_COMMUNITY): Payer: Self-pay | Admitting: Oncology

## 2014-12-30 ENCOUNTER — Other Ambulatory Visit (HOSPITAL_COMMUNITY): Payer: Self-pay | Admitting: Oncology

## 2014-12-30 DIAGNOSIS — C50911 Malignant neoplasm of unspecified site of right female breast: Secondary | ICD-10-CM

## 2014-12-30 MED ORDER — MEGESTROL ACETATE 40 MG PO TABS: 40.0000 mg | ORAL_TABLET | Freq: Every day | ORAL | Status: DC

## 2015-01-31 ENCOUNTER — Encounter (HOSPITAL_COMMUNITY): Payer: Self-pay | Admitting: Hematology & Oncology

## 2015-03-16 ENCOUNTER — Ambulatory Visit (HOSPITAL_COMMUNITY)
Admission: RE | Admit: 2015-03-16 | Discharge: 2015-03-16 | Disposition: A | Discharge status: Home / Self Care | Payer: Medicare Other | Source: Ambulatory Visit | Attending: Hematology & Oncology | Admitting: Hematology & Oncology

## 2015-03-16 DIAGNOSIS — Z1231 Encounter for screening mammogram for malignant neoplasm of breast: Secondary | ICD-10-CM | POA: Insufficient documentation

## 2015-04-28 ENCOUNTER — Other Ambulatory Visit (HOSPITAL_COMMUNITY): Payer: Self-pay | Admitting: Oncology

## 2015-06-05 ENCOUNTER — Encounter (HOSPITAL_COMMUNITY): Payer: Self-pay | Admitting: *Deleted

## 2015-06-05 ENCOUNTER — Emergency Department (HOSPITAL_COMMUNITY): Payer: Medicare HMO

## 2015-06-05 ENCOUNTER — Inpatient Hospital Stay (HOSPITAL_COMMUNITY)
Admission: EM | Admit: 2015-06-05 | Discharge: 2015-06-12 | DRG: 335 | Disposition: A | Discharge status: Home / Self Care | Payer: Medicare HMO | Attending: Family Medicine | Admitting: Family Medicine

## 2015-06-05 DIAGNOSIS — C9110 Chronic lymphocytic leukemia of B-cell type not having achieved remission: Secondary | ICD-10-CM | POA: Diagnosis present

## 2015-06-05 DIAGNOSIS — R739 Hyperglycemia, unspecified: Secondary | ICD-10-CM | POA: Diagnosis present

## 2015-06-05 DIAGNOSIS — Z9049 Acquired absence of other specified parts of digestive tract: Secondary | ICD-10-CM

## 2015-06-05 DIAGNOSIS — I10 Essential (primary) hypertension: Secondary | ICD-10-CM

## 2015-06-05 DIAGNOSIS — T4145XA Adverse effect of unspecified anesthetic, initial encounter: Secondary | ICD-10-CM

## 2015-06-05 DIAGNOSIS — J9811 Atelectasis: Secondary | ICD-10-CM | POA: Diagnosis not present

## 2015-06-05 DIAGNOSIS — T8859XA Other complications of anesthesia, initial encounter: Secondary | ICD-10-CM

## 2015-06-05 DIAGNOSIS — R1031 Right lower quadrant pain: Secondary | ICD-10-CM | POA: Diagnosis not present

## 2015-06-05 DIAGNOSIS — K565 Intestinal adhesions [bands] with obstruction (postprocedural) (postinfection): Secondary | ICD-10-CM | POA: Diagnosis not present

## 2015-06-05 DIAGNOSIS — Z4659 Encounter for fitting and adjustment of other gastrointestinal appliance and device: Secondary | ICD-10-CM

## 2015-06-05 DIAGNOSIS — Z96641 Presence of right artificial hip joint: Secondary | ICD-10-CM | POA: Diagnosis present

## 2015-06-05 DIAGNOSIS — G4733 Obstructive sleep apnea (adult) (pediatric): Secondary | ICD-10-CM | POA: Diagnosis not present

## 2015-06-05 DIAGNOSIS — J9601 Acute respiratory failure with hypoxia: Secondary | ICD-10-CM | POA: Diagnosis not present

## 2015-06-05 DIAGNOSIS — Z9011 Acquired absence of right breast and nipple: Secondary | ICD-10-CM

## 2015-06-05 DIAGNOSIS — K5669 Other intestinal obstruction: Secondary | ICD-10-CM | POA: Diagnosis not present

## 2015-06-05 DIAGNOSIS — Z809 Family history of malignant neoplasm, unspecified: Secondary | ICD-10-CM

## 2015-06-05 DIAGNOSIS — Z853 Personal history of malignant neoplasm of breast: Secondary | ICD-10-CM

## 2015-06-05 DIAGNOSIS — K56609 Unspecified intestinal obstruction, unspecified as to partial versus complete obstruction: Secondary | ICD-10-CM

## 2015-06-05 LAB — CBC WITH DIFFERENTIAL/PLATELET
BASOS PCT: 0 %
Basophils Absolute: 0 10*3/uL (ref 0.0–0.1)
EOS PCT: 0 %
Eosinophils Absolute: 0 10*3/uL (ref 0.0–0.7)
HCT: 43.9 % (ref 36.0–46.0)
HEMOGLOBIN: 14.5 g/dL (ref 12.0–15.0)
LYMPHS PCT: 76 %
Lymphs Abs: 53.7 10*3/uL — ABNORMAL HIGH (ref 0.7–4.0)
MCH: 29.8 pg (ref 26.0–34.0)
MCHC: 33 g/dL (ref 30.0–36.0)
MCV: 90.1 fL (ref 78.0–100.0)
MONOS PCT: 2 %
Monocytes Absolute: 1.4 10*3/uL — ABNORMAL HIGH (ref 0.1–1.0)
NEUTROS PCT: 22 %
Neutro Abs: 15.6 10*3/uL — ABNORMAL HIGH (ref 1.7–7.7)
Platelets: 235 10*3/uL (ref 150–400)
RBC: 4.87 MIL/uL (ref 3.87–5.11)
RDW: 15 % (ref 11.5–15.5)
WBC: 70.7 10*3/uL — AB (ref 4.0–10.5)

## 2015-06-05 LAB — COMPREHENSIVE METABOLIC PANEL
ALK PHOS: 60 U/L (ref 38–126)
ALT: 17 U/L (ref 14–54)
AST: 21 U/L (ref 15–41)
Albumin: 4.4 g/dL (ref 3.5–5.0)
Anion gap: 12 (ref 5–15)
BILIRUBIN TOTAL: 0.8 mg/dL (ref 0.3–1.2)
BUN: 17 mg/dL (ref 6–20)
CALCIUM: 9.6 mg/dL (ref 8.9–10.3)
CO2: 22 mmol/L (ref 22–32)
CREATININE: 0.84 mg/dL (ref 0.44–1.00)
Chloride: 109 mmol/L (ref 101–111)
GFR calc Af Amer: >60 mL/min (ref >60)
GLUCOSE: 134 mg/dL — AB (ref 65–99)
POTASSIUM: 3.8 mmol/L (ref 3.5–5.1)
Sodium: 143 mmol/L (ref 135–145)
TOTAL PROTEIN: 7.2 g/dL (ref 6.5–8.1)

## 2015-06-05 LAB — URINALYSIS, ROUTINE W REFLEX MICROSCOPIC
BILIRUBIN URINE: NEGATIVE
GLUCOSE, UA: NEGATIVE mg/dL
Leukocytes, UA: NEGATIVE
Nitrite: NEGATIVE
PH: 5.5 (ref 5.0–8.0)
Specific Gravity, Urine: >1.03 — ABNORMAL HIGH (ref 1.005–1.030)

## 2015-06-05 LAB — LIPASE, BLOOD: LIPASE: 25 U/L (ref 11–51)

## 2015-06-05 LAB — URINE MICROSCOPIC-ADD ON: WBC, UA: NONE SEEN WBC/hpf (ref 0–5)

## 2015-06-05 MED ORDER — VITAMIN D 1000 UNITS PO TABS
1000.0000 [IU] | ORAL_TABLET | Freq: Every day | ORAL | Status: DC
  Administered 2015-06-07: 1000 [IU] via ORAL
  Filled 2015-06-05 (×4): qty 1

## 2015-06-05 MED ORDER — ENOXAPARIN SODIUM 40 MG/0.4ML ~~LOC~~ SOLN
40.0000 mg | SUBCUTANEOUS | Status: DC
  Administered 2015-06-05 – 2015-06-11 (×7): 40 mg via SUBCUTANEOUS
  Filled 2015-06-05 (×8): qty 0.4

## 2015-06-05 MED ORDER — IOHEXOL 300 MG/ML  SOLN
100.0000 mL | Freq: Once | INTRAMUSCULAR | Status: AC | PRN
  Administered 2015-06-05: 100 mL via INTRAVENOUS

## 2015-06-05 MED ORDER — ACETAMINOPHEN 650 MG RE SUPP: 650.0000 mg | Freq: Four times a day (QID) | RECTAL | Status: DC | PRN

## 2015-06-05 MED ORDER — ASPIRIN 81 MG PO CHEW: 81.0000 mg | CHEWABLE_TABLET | Freq: Every day | ORAL | Status: DC

## 2015-06-05 MED ORDER — POTASSIUM CHLORIDE IN NACL 20-0.9 MEQ/L-% IV SOLN
INTRAVENOUS | Status: AC
  Administered 2015-06-05: 17:00:00 via INTRAVENOUS
  Administered 2015-06-06: 100 mL/h via INTRAVENOUS

## 2015-06-05 MED ORDER — DIATRIZOATE MEGLUMINE & SODIUM 66-10 % PO SOLN
ORAL | Status: AC
  Administered 2015-06-05: 15 mL
  Filled 2015-06-05: qty 30

## 2015-06-05 MED ORDER — SODIUM CHLORIDE 0.9 % IV SOLN: Freq: Once | INTRAVENOUS | Status: DC

## 2015-06-05 MED ORDER — MEGESTROL ACETATE 40 MG PO TABS
40.0000 mg | ORAL_TABLET | Freq: Every day | ORAL | Status: DC
  Administered 2015-06-07: 40 mg via ORAL
  Filled 2015-06-05 (×5): qty 1

## 2015-06-05 MED ORDER — GABAPENTIN 100 MG PO CAPS
100.0000 mg | ORAL_CAPSULE | Freq: Every day | ORAL | Status: DC
  Administered 2015-06-07: 100 mg via ORAL
  Filled 2015-06-05: qty 1

## 2015-06-05 MED ORDER — ENOXAPARIN SODIUM 40 MG/0.4ML ~~LOC~~ SOLN
40.0000 mg | SUBCUTANEOUS | Status: DC
  Filled 2015-06-05: qty 0.4

## 2015-06-05 MED ORDER — ALBUTEROL SULFATE HFA 108 (90 BASE) MCG/ACT IN AERS
2.0000 | INHALATION_SPRAY | Freq: Four times a day (QID) | RESPIRATORY_TRACT | Status: DC | PRN
  Filled 2015-06-05: qty 6.7

## 2015-06-05 MED ORDER — ONDANSETRON HCL 4 MG/2ML IJ SOLN
4.0000 mg | Freq: Four times a day (QID) | INTRAMUSCULAR | Status: DC | PRN
  Administered 2015-06-06 – 2015-06-10 (×7): 4 mg via INTRAVENOUS
  Filled 2015-06-05 (×7): qty 2

## 2015-06-05 MED ORDER — ACETAMINOPHEN 325 MG PO TABS
650.0000 mg | ORAL_TABLET | Freq: Four times a day (QID) | ORAL | Status: DC | PRN
  Administered 2015-06-07 – 2015-06-11 (×4): 650 mg via ORAL
  Filled 2015-06-05 (×3): qty 2

## 2015-06-05 MED ORDER — HYDROMORPHONE HCL 1 MG/ML IJ SOLN
0.5000 mg | INTRAMUSCULAR | Status: DC | PRN
  Administered 2015-06-06 – 2015-06-08 (×9): 0.5 mg via INTRAVENOUS
  Filled 2015-06-05 (×9): qty 1

## 2015-06-05 MED ORDER — SODIUM CHLORIDE 0.9 % IV BOLUS (SEPSIS)
1000.0000 mL | Freq: Once | INTRAVENOUS | Status: AC
  Administered 2015-06-05: 1000 mL via INTRAVENOUS

## 2015-06-05 MED ORDER — HYDROMORPHONE HCL 1 MG/ML IJ SOLN
0.5000 mg | Freq: Once | INTRAMUSCULAR | Status: AC
  Administered 2015-06-05: 0.5 mg via INTRAVENOUS
  Filled 2015-06-05: qty 1

## 2015-06-05 MED ORDER — ALBUTEROL SULFATE HFA 108 (90 BASE) MCG/ACT IN AERS: 2.0000 | INHALATION_SPRAY | Freq: Four times a day (QID) | RESPIRATORY_TRACT | Status: DC | PRN

## 2015-06-05 MED ORDER — PANTOPRAZOLE SODIUM 40 MG IV SOLR
40.0000 mg | INTRAVENOUS | Status: DC
  Filled 2015-06-05: qty 40

## 2015-06-05 MED ORDER — PANTOPRAZOLE SODIUM 40 MG IV SOLR
40.0000 mg | INTRAVENOUS | Status: DC
  Administered 2015-06-05 – 2015-06-11 (×6): 40 mg via INTRAVENOUS
  Filled 2015-06-05 (×7): qty 40

## 2015-06-05 MED ORDER — ONDANSETRON HCL 4 MG PO TABS: 4.0000 mg | ORAL_TABLET | Freq: Four times a day (QID) | ORAL | Status: DC | PRN

## 2015-06-05 MED ORDER — ONDANSETRON HCL 4 MG/2ML IJ SOLN
4.0000 mg | Freq: Once | INTRAMUSCULAR | Status: AC
  Administered 2015-06-05: 4 mg via INTRAVENOUS
  Filled 2015-06-05: qty 2

## 2015-06-05 MED ORDER — ALBUTEROL SULFATE (2.5 MG/3ML) 0.083% IN NEBU: 2.5000 mg | INHALATION_SOLUTION | RESPIRATORY_TRACT | Status: DC | PRN

## 2015-06-05 MED ORDER — GABAPENTIN 100 MG PO CAPS
200.0000 mg | ORAL_CAPSULE | Freq: Every day | ORAL | Status: DC
  Filled 2015-06-05: qty 2

## 2015-06-05 MED ORDER — GABAPENTIN 100 MG PO CAPS: 100.0000 mg | ORAL_CAPSULE | Freq: Two times a day (BID) | ORAL | Status: DC

## 2015-06-05 NOTE — ED Notes (Signed)
Pt brought in by [facility]. Pt has lower abdominal pain that started this morning around 0600. Vomiting x 2 prior to EMS arrival, green in color, approx 400cc. Pt had 2 vomiting episodes when EMS arrived at hospital. Pt has hx of leukemia and right mastectomy. EMS attempted IV stick, unsuccessful.

## 2015-06-05 NOTE — ED Notes (Signed)
CRITICAL VALUE ALERT  Critical value received:  70.7  Date of notification:  06/05/15  Time of notification:  [id number]  Critical value read back:Yes.    Nurse who received alert:  Norm Salt, RN  MD notified (1st page):  Dr. Sabra Heck  Time of first page:  1358  MD notified (2nd page):  Time of second page:  Responding MD:  Dr. Sabra Heck  Time MD responded:  1359

## 2015-06-05 NOTE — H&P (Signed)
[facility] History and Physical  [name] [id number] DOB: Apr 22, 1936 DOA: 06/05/2015  Referring physician: ED physician PCP: [name], MD  Specialists: Dr. Whitney Muse (oncology)   Chief Complaint: Abdominal pain, vomiting  HPI: [name] is a 80 y.o. female with PMH of breast cancer status post right mastectomy, and CLL that is described as "remarkably stable" per her oncologist who presents to the ED with right lower quadrant abdominal pain and vomiting of several hours duration.  When going to sleep last night, [name] had been in her usual state of health without fevers, chills, nausea, vomiting, diarrhea, or constipation, but was awoken at 6 AM by crampy right lower quadrant abdominal pain.  She describes the pain is crampy in character, localized to the right lower quadrant, constant, and relieved temporarily by vomiting or bowel movement. She describes having 2 normal bowel movements in the morning with some relief in pain following these, but by late morning the pain was severe, she had nonbloody vomiting 2, and called EMS. Of note, she has history of appendectomy, cholecystectomy, and abdominal hysterectomy in the remote past.  In ED, patient was found to be afebrile, saturating well on room air, and with vital signs stable. She vomited 2 more times in the emergency department. CT of the abdomen was obtained, demonstrating small bowel obstruction with transition point in the right lower abdomen. Basic blood work returned notable for a WBC of 71,000, with smudge cells seen on peripheral smear. Patient was started on IV fluids, NG tube was placed to wall suction, and patient was admitted to the hospital for ongoing evaluation and management of small bowel obstruction.  Where does patient live?   At home   Can patient participate in ADLs?  Yes         Review of Systems:   General: no fevers, chills, sweats, weight change, poor appetite, or  fatigue HEENT: no blurry vision, hearing changes or sore throat Pulm: no dyspnea, cough, or wheeze CV: no chest pain or palpitations Abd:  RLQ pain, nausea, non-bloody vomiting.  No diarrhea, or constipation GU: no dysuria, hematuria, increased urinary frequency, or urgency  Ext: no leg edema Neuro: no focal weakness, numbness, or tingling, no vision change or hearing loss Skin: no rash, no wounds MSK: No muscle spasm, no deformity, no red, hot, or swollen joint Heme: No easy bruising or bleeding Travel history: No recent long distant travel    Allergy:  Allergies  Allergen Reactions  . Codeine Nausea And Vomiting    headache  . Meclizine Other (See Comments)    Made dizziness worse.    Past Medical History  Diagnosis Date  . CLL (chronic lymphocytic leukemia) ([place]) 07/21/2011  . Breast cancer, stage 3 ([place]) 07/21/2011  . Bronchitis     Past Surgical History  Procedure Laterality Date  . Appendectomy    . Abdominal hysterectomy    . Cholecystectomy    . Mastectomy      right  . Total hip arthroplasty      right    Social History:  reports that she has never smoked. She has never used smokeless tobacco. She reports that she does not drink alcohol or use illicit drugs.  Family History:  Family History  Problem Relation Age of Onset  . Cancer    . Cancer Brother      Prior to Admission medications   Medication Sig Start Date End Date Taking? Authorizing Provider  albuterol (PROVENTIL HFA;VENTOLIN HFA) 108 (  90 BASE) MCG/ACT inhaler Inhale 2 puffs into the lungs every 6 (six) hours as needed for shortness of breath.    Yes Historical Provider, MD  albuterol (PROVENTIL) (2.5 MG/3ML) 0.083% nebulizer solution Take 2.5 mg by nebulization every 4 (four) hours as needed for wheezing or shortness of breath.   Yes Historical Provider, MD  aspirin 81 MG tablet Take 81 mg by mouth at bedtime.    Yes Historical Provider, MD  cholecalciferol (VITAMIN D) 1000 UNITS tablet Take  1,000 Units by mouth daily.   Yes Historical Provider, MD  gabapentin (NEURONTIN) 100 MG capsule Take 100-200 mg by mouth 2 (two) times daily. Take one capsule in AM, 2 capsules at bedtime 12/17/14  Yes Historical Provider, MD  megestrol (MEGACE) 40 MG tablet TAKE ONE TABLET BY MOUTH DAILY AT BEDTIME. 04/28/15  Yes Baird Cancer, PA-C  omeprazole (PRILOSEC) 20 MG capsule Take 20 mg by mouth at bedtime.    Yes Historical Provider, MD    Physical Exam: Filed Vitals:   06/05/15 1300 06/05/15 1330 06/05/15 1400 06/05/15 1531  BP: 99/85 112/79 126/93 110/97  Pulse: 84 73 74 76  Temp:      TempSrc:      Resp:      Height:      Weight:      SpO2: 99% 96% 98% 94%   General: Not in acute distress HEENT:       Eyes: PERRL, EOMI, no scleral icterus or conjunctival pallor.       ENT: No discharge from the ears or nose, no pharyngeal ulcers, NGT in place with light green output.        Neck: No JVD, no bruit, no appreciable mass Heme: No cervical adenopathy, no pallor Cardiac: S1/S2, RRR, No significant murmurs, No gallops or rubs. Pulm: Good air movement bilaterally. No rales, wheezing, rhonchi or rubs. Abd: Soft, nondistended, mildly tender in RLQ, no rebound pain or gaurding, no mass or organomegaly, BS hypoactive, but present. Ext: No LE edema bilaterally. 2+DP/PT pulse bilaterally. Musculoskeletal: No gross deformity, no red, hot, swollen joints, no limitation in ROM  Skin: No rashes or wounds on exposed surfaces  Neuro: Alert, oriented X3, cranial nerves II-XII grossly intact. No focal findings Psych: Patient is not overtly psychotic, appropriate mood and affect.  Labs on Admission:  Basic Metabolic Panel:  Recent Labs Lab 06/05/15 1309  NA 143  K 3.8  CL 109  CO2 22  GLUCOSE 134*  BUN 17  CREATININE 0.84  CALCIUM 9.6   Liver Function Tests:  Recent Labs Lab 06/05/15 1309  AST 21  ALT 17  ALKPHOS 60  BILITOT 0.8  PROT 7.2  ALBUMIN 4.4    Recent Labs Lab  06/05/15 1309  LIPASE 25   No results for input(s): AMMONIA in the last 168 hours. CBC:  Recent Labs Lab 06/05/15 1309  WBC 70.7*  NEUTROABS 15.6*  HGB 14.5  HCT 43.9  MCV 90.1  PLT 235   Cardiac Enzymes: No results for input(s): CKTOTAL, CKMB, CKMBINDEX, TROPONINI in the last 168 hours.  BNP (last 3 results) No results for input(s): BNP in the last 8760 hours.  ProBNP (last 3 results) No results for input(s): PROBNP in the last 8760 hours.  CBG: No results for input(s): GLUCAP in the last 168 hours.  Radiological Exams on Admission: Ct Abdomen Pelvis W Contrast  06/05/2015  CLINICAL DATA:  Right lower quadrant pain starting today this morning, vomiting, history of leukemia, status post  mastectomy for breast cancer EXAM: CT ABDOMEN AND PELVIS WITH CONTRAST TECHNIQUE: Multidetector CT imaging of the abdomen and pelvis was performed using the standard protocol following bolus administration of intravenous contrast. CONTRAST:  182mL OMNIPAQUE IOHEXOL 300 MG/ML  SOLN COMPARISON:  Report CT scan 07/19/2002 no images available, lumbar spine x-ray 10/01/2013 FINDINGS: Sagittal images of the spine shows diffuse osteopenia. Mild disc space flattening with anterior spurring at L2-L3 level. Stable mild compression deformity upper endplate of 624THL vertebral body. Mild fatty infiltration of the liver. No solid hepatic mass is noted. Atherosclerotic calcifications of mitral valve. Visualized lung bases are unremarkable. In mild intrahepatic biliary ductal dilatation probable postcholecystectomy. There is a cyst in right hepatic lobe posterior medially measures 6.5 mm. No aortic aneurysm. Enhanced pancreas, spleen and adrenal glands are unremarkable. Enhanced kidneys are symmetrical in size. No hydronephrosis or hydroureter. On there is a coarse calcified calculus in lower pole of the right kidney extending in right collecting system. Measures 2.1 cm x 1.1 cm. There is no evidence of obstruction. Mild  dilatation of the right extrarenal pelvis. There is probable cortical scarring in lower pole of the right kidney. No hydronephrosis or hydroureter. No aortic aneurysm. There is no pericecal inflammation. Some liquid stool noted within cecum. The appendix is surgically absent. There are distended small bowel loops with fluid and some air-fluid levels. In axial image 42 there is mild segmental dilatation of small bowel in right abdomen with fecal like material. This segment is best visualized in coronal image 52 measures at least 5.6 cm in length. There is mild adjacent fluid in small bowel mesentery and probable mild mesenteric edema at this level. Beyond this point the small bowel is smaller caliber as seen in coronal image 45. Findings are highly suspicious for small bowel obstruction. Distal small bowel loops are small caliber. The terminal ileum is decompressed small caliber. The transverse colon descending colon and proximal sigmoid colon is empty decompressed. Multiple sigmoid colon diverticula are noted. No evidence of acute diverticulitis. The patient is status post hysterectomy. Evaluation of the pelvis is limited by metallic artifacts from right hip prosthesis. The patient is status post right mastectomy. IMPRESSION: 1. There are dilated small bowel loops with multiple air-fluid levels. There is short segment distended small bowel containing fecal like material in right abdomen with small amount of adjacent mesenteric fluid and edema please see axial image 42. There is transition point of the caliber of small bowel at this level. Findings are highly suspicious for small bowel obstruction. 2. No pericecal inflammation.  Status post appendectomy. 3. Fatty infiltration of the liver. Status postcholecystectomy. A cyst is noted in right hepatic lobe measures 6.5 mm. 4. There is probable cortical scarring in lower pole of the right kidney. Large calcified nonobstructive calculus is noted in lower pole of the  right kidney measures at least 2.1 x 1.1 cm. No hydronephrosis or hydroureter. 5. Decompressed majority of the colon. Sigmoid colon diverticula are noted. No evidence of acute diverticulitis. 6. Status post hysterectomy. 7. Limited evaluation of the pelvis due to metallic artifacts from right hip prosthesis. These results were called by telephone at the time of interpretation on 06/05/2015 at 2:57 pm to Dr. Noemi Chapel , who verbally acknowledged these results. Electronically Signed   By: Lahoma Crocker M.D.   On: 06/05/2015 14:57    EKG:   Not done in ED, will obtain as appropriate   Assessment/Plan Principal Problem:   SBO (small bowel obstruction) (HCC) Active Problems:  OSA (obstructive sleep apnea)   CLL (chronic lymphocytic leukemia) (HCC)   Hyperglycemia   1. SBO  - Not passing flatus since admission, BS hypoactive but present  - No mass on CT abdomen, SBO likely precipitated by adhesions in setting of 3 remote abd surgeries - NPO for now  - NGT to continuous low suction  - Continue home PPI, switch to IV Protonix while inpt and NPO - Will monitor for resolution, advance diet to clears when passing flatus  - Surgical consultation may be required if fails to resolve on its own    2. OSA  - CPAP qHS    3. Hyperglycemia  - Serum glucose elevated on admission  - Check CBGs and institute a SSI regimen if elevations persist   4. CLL - "Remarkably stable" per oncologist notes  - WBC 71k on admission, a little higher than typical for her  - Do not anticipate this become acute inpt problem, monitoring    DVT ppx:  SQ Lovenox    Code Status: Full code Family Communication: None at bed side.              Disposition Plan: Admit to inpatient   Date of Service 06/05/2015    Vianne Bulls, MD [facility] Pager [phone]  If 7PM-7AM, please contact night-coverage [email or web address] Password [email or web address] 06/05/2015, 3:52 PM

## 2015-06-05 NOTE — Progress Notes (Signed)
Pt states she does not wear CPAP at home anymore. She stated she is not having any problems breathing and it makes her feel like she is suffocating. RT informed pt to call if she changed her mind or had any trouble breathing. Pt stated she would.

## 2015-06-05 NOTE — ED Notes (Signed)
MD at bedside. 

## 2015-06-05 NOTE — ED Provider Notes (Signed)
CSN: [id number]     Arrival date & time 06/05/15  1238 History   First MD Initiated Contact with Patient 06/05/15 1240     Chief Complaint  Patient presents with  . Abdominal Pain  . Emesis     (Consider location/radiation/quality/duration/timing/severity/associated sxs/prior Treatment) HPI Comments: The patient is a 80 year old female who arrives by paramedic transport, she states that around 6:00 this morning she developed some abdominal pain which is located in the lower abdomen, comes in waves, radiates from the right side over towards the left lower quadrant, is associated with 2 episodes of vomiting, she has also had 2 large bowel movements prior to arrival. She does have a history of leukemia as well as a right mastectomy. Paramedics were unable to obtain IV access in the field. At this time the patient states that her pain has eased off, she has no nausea and denies any diarrhea. She has had an appendectomy at the age of 85, a hysterectomy, no other abdominal surgery per the patient's report  Patient is a 80 y.o. female presenting with abdominal pain and vomiting. The history is provided by the patient.  Abdominal Pain Associated symptoms: vomiting   Emesis Associated symptoms: abdominal pain     Past Medical History  Diagnosis Date  . CLL (chronic lymphocytic leukemia) ([place]) 07/21/2011  . Breast cancer, stage 3 ([place]) 07/21/2011  . Bronchitis    Past Surgical History  Procedure Laterality Date  . Appendectomy    . Abdominal hysterectomy    . Cholecystectomy    . Mastectomy      right  . Total hip arthroplasty      right   Family History  Problem Relation Age of Onset  . Cancer    . Cancer Brother    Social History  Substance Use Topics  . Smoking status: Never Smoker   . Smokeless tobacco: Never Used  . Alcohol Use: No   OB History    No data available     Review of Systems  Gastrointestinal: Positive for vomiting and abdominal pain.  All other systems  reviewed and are negative.     Allergies  Codeine and Meclizine  Home Medications   Prior to Admission medications   Medication Sig Start Date End Date Taking? Authorizing Provider  albuterol (PROVENTIL HFA;VENTOLIN HFA) 108 (90 BASE) MCG/ACT inhaler Inhale 2 puffs into the lungs every 6 (six) hours as needed for shortness of breath.    Yes Historical Provider, MD  albuterol (PROVENTIL) (2.5 MG/3ML) 0.083% nebulizer solution Take 2.5 mg by nebulization every 4 (four) hours as needed for wheezing or shortness of breath.   Yes Historical Provider, MD  aspirin 81 MG tablet Take 81 mg by mouth at bedtime.    Yes Historical Provider, MD  cholecalciferol (VITAMIN D) 1000 UNITS tablet Take 1,000 Units by mouth daily.   Yes Historical Provider, MD  gabapentin (NEURONTIN) 100 MG capsule Take 100-200 mg by mouth 2 (two) times daily. Take one capsule in AM, 2 capsules at bedtime 12/17/14  Yes Historical Provider, MD  megestrol (MEGACE) 40 MG tablet TAKE ONE TABLET BY MOUTH DAILY AT BEDTIME. 04/28/15  Yes Baird Cancer, PA-C  omeprazole (PRILOSEC) 20 MG capsule Take 20 mg by mouth at bedtime.    Yes Historical Provider, MD   BP 112/79 mmHg  Pulse 73  Temp(Src) 97.9 F (36.6 C) (Oral)  Resp 16  Ht 5\' 2"  (1.575 m)  Wt 181 lb (82.101 kg)  BMI 33.10 kg/m2  SpO2 96% Physical Exam  Constitutional: She appears well-developed and well-nourished. No distress.  HENT:  Head: Normocephalic and atraumatic.  Mouth/Throat: Oropharynx is clear and moist. No oropharyngeal exudate.  Eyes: Conjunctivae and EOM are normal. Pupils are equal, round, and reactive to light. Right eye exhibits no discharge. Left eye exhibits no discharge. No scleral icterus.  Neck: Normal range of motion. Neck supple. No JVD present. No thyromegaly present.  Cardiovascular: Normal rate, regular rhythm, normal heart sounds and intact distal pulses.  Exam reveals no gallop and no friction rub.   No murmur heard. Pulmonary/Chest:  Effort normal and breath sounds normal. No respiratory distress. She has no wheezes. She has no rales.  Abdominal: Soft. Bowel sounds are normal. She exhibits no distension and no mass. There is tenderness (focal tenderness to palpation of the right lower abdomen, suprapubic region, less pain in the left lower quadrant).  No right upper quadrant tenderness, no Murphy sign, no guarding or peritoneal signs, no palpable pulsating mass  Musculoskeletal: Normal range of motion. She exhibits no edema or tenderness.  Lymphadenopathy:    She has no cervical adenopathy.  Neurological: She is alert. Coordination normal.  Skin: Skin is warm and dry. No rash noted. No erythema.  Psychiatric: She has a normal mood and affect. Her behavior is normal.  Nursing note and vitals reviewed.   ED Course   NG placement Date/Time: 06/05/2015 3:23 PM Performed by: Noemi Chapel Authorized by: Noemi Chapel Consent: Verbal consent obtained. Risks and benefits: risks, benefits and alternatives were discussed Consent given by: patient Patient understanding: patient states understanding of the procedure being performed Required items: required blood products, implants, devices, and special equipment available Patient identity confirmed: verbally with patient Time out: Immediately prior to procedure a "time out" was called to verify the correct patient, procedure, equipment, support staff and site/side marked as required. Preparation: Patient was prepped and draped in the usual sterile fashion. Local anesthesia used: no Patient sedated: no Patient tolerance: Patient tolerated the procedure well with no immediate complications Comments: NG tube placed by myself X 1 attempt - confirmed placement with auscultation - f/u xray ordered   (including critical care time) Labs Review Labs Reviewed  CBC WITH DIFFERENTIAL/PLATELET - Abnormal; Notable for the following:    WBC 70.7 (*)    Neutro Abs 15.6 (*)    Lymphs Abs  53.7 (*)    Monocytes Absolute 1.4 (*)    All other components within normal limits  COMPREHENSIVE METABOLIC PANEL - Abnormal; Notable for the following:    Glucose, Bld 134 (*)    All other components within normal limits  URINALYSIS, ROUTINE W REFLEX MICROSCOPIC (NOT AT [facility]) - Abnormal; Notable for the following:    Specific Gravity, Urine >1.030 (*)    Hgb urine dipstick MODERATE (*)    Ketones, ur TRACE (*)    Protein, ur TRACE (*)    All other components within normal limits  URINE MICROSCOPIC-ADD ON - Abnormal; Notable for the following:    Squamous Epithelial / LPF 0-5 (*)    Bacteria, UA RARE (*)    All other components within normal limits  LIPASE, BLOOD    Imaging Review Ct Abdomen Pelvis W Contrast  06/05/2015  CLINICAL DATA:  Right lower quadrant pain starting today this morning, vomiting, history of leukemia, status post mastectomy for breast cancer EXAM: CT ABDOMEN AND PELVIS WITH CONTRAST TECHNIQUE: Multidetector CT imaging of the abdomen and pelvis was performed using the standard protocol following bolus  administration of intravenous contrast. CONTRAST:  145mL OMNIPAQUE IOHEXOL 300 MG/ML  SOLN COMPARISON:  Report CT scan 07/19/2002 no images available, lumbar spine x-ray 10/01/2013 FINDINGS: Sagittal images of the spine shows diffuse osteopenia. Mild disc space flattening with anterior spurring at L2-L3 level. Stable mild compression deformity upper endplate of 624THL vertebral body. Mild fatty infiltration of the liver. No solid hepatic mass is noted. Atherosclerotic calcifications of mitral valve. Visualized lung bases are unremarkable. In mild intrahepatic biliary ductal dilatation probable postcholecystectomy. There is a cyst in right hepatic lobe posterior medially measures 6.5 mm. No aortic aneurysm. Enhanced pancreas, spleen and adrenal glands are unremarkable. Enhanced kidneys are symmetrical in size. No hydronephrosis or hydroureter. On there is a coarse calcified  calculus in lower pole of the right kidney extending in right collecting system. Measures 2.1 cm x 1.1 cm. There is no evidence of obstruction. Mild dilatation of the right extrarenal pelvis. There is probable cortical scarring in lower pole of the right kidney. No hydronephrosis or hydroureter. No aortic aneurysm. There is no pericecal inflammation. Some liquid stool noted within cecum. The appendix is surgically absent. There are distended small bowel loops with fluid and some air-fluid levels. In axial image 42 there is mild segmental dilatation of small bowel in right abdomen with fecal like material. This segment is best visualized in coronal image 52 measures at least 5.6 cm in length. There is mild adjacent fluid in small bowel mesentery and probable mild mesenteric edema at this level. Beyond this point the small bowel is smaller caliber as seen in coronal image 45. Findings are highly suspicious for small bowel obstruction. Distal small bowel loops are small caliber. The terminal ileum is decompressed small caliber. The transverse colon descending colon and proximal sigmoid colon is empty decompressed. Multiple sigmoid colon diverticula are noted. No evidence of acute diverticulitis. The patient is status post hysterectomy. Evaluation of the pelvis is limited by metallic artifacts from right hip prosthesis. The patient is status post right mastectomy. IMPRESSION: 1. There are dilated small bowel loops with multiple air-fluid levels. There is short segment distended small bowel containing fecal like material in right abdomen with small amount of adjacent mesenteric fluid and edema please see axial image 42. There is transition point of the caliber of small bowel at this level. Findings are highly suspicious for small bowel obstruction. 2. No pericecal inflammation.  Status post appendectomy. 3. Fatty infiltration of the liver. Status postcholecystectomy. A cyst is noted in right hepatic lobe measures 6.5 mm.  4. There is probable cortical scarring in lower pole of the right kidney. Large calcified nonobstructive calculus is noted in lower pole of the right kidney measures at least 2.1 x 1.1 cm. No hydronephrosis or hydroureter. 5. Decompressed majority of the colon. Sigmoid colon diverticula are noted. No evidence of acute diverticulitis. 6. Status post hysterectomy. 7. Limited evaluation of the pelvis due to metallic artifacts from right hip prosthesis. These results were called by telephone at the time of interpretation on 06/05/2015 at 2:57 pm to Dr. Noemi Chapel , who verbally acknowledged these results. Electronically Signed   By: Lahoma Crocker M.D.   On: 06/05/2015 14:57   I have personally reviewed and evaluated these images and lab results as part of my medical decision-making.  MDM   Final diagnoses:  SBO (small bowel obstruction) ([place])   The patient does not appear in distress though she does appear dehydrated and states that she has not had anything to eat or drink  in 24 hours. The increased abdominal pain this morning with multiple episodes of vomiting does raise concern for acute intra-abdominal process, would consider bowel obstruction, aortic aneurysm as potential sources, less likely to be pancreatitis or gallstones given no upper abdominal tenderness. She does not appear jaundiced, her vital signs are fairly unremarkable. Labs, medications, imaging.  CT shows SBO - otherwise bloodwork close to baseline - no perforation - no abscess - d/w radiologist who agrees  I place the NG tube  D/w Dr. Myna Hidalgo - holding orders placed.  Meds given in ED:  Medications  0.9 %  sodium chloride infusion (not administered)  sodium chloride 0.9 % bolus 1,000 mL (1,000 mLs Intravenous New Bag/Given 06/05/15 1308)  diatrizoate meglumine-sodium (GASTROGRAFIN) 66-10 % solution (15 mLs  Given 06/05/15 1425)  iohexol (OMNIPAQUE) 300 MG/ML solution 100 mL (100 mLs Intravenous Contrast Given 06/05/15 1420)   HYDROmorphone (DILAUDID) injection 0.5 mg (0.5 mg Intravenous Given 06/05/15 1513)  ondansetron (ZOFRAN) injection 4 mg (4 mg Intravenous Given 06/05/15 1512)         Noemi Chapel, MD 06/05/15 1524

## 2015-06-06 ENCOUNTER — Observation Stay (HOSPITAL_COMMUNITY): Payer: Medicare HMO

## 2015-06-06 DIAGNOSIS — C9110 Chronic lymphocytic leukemia of B-cell type not having achieved remission: Secondary | ICD-10-CM | POA: Diagnosis present

## 2015-06-06 DIAGNOSIS — Z9049 Acquired absence of other specified parts of digestive tract: Secondary | ICD-10-CM | POA: Diagnosis not present

## 2015-06-06 DIAGNOSIS — K5669 Other intestinal obstruction: Secondary | ICD-10-CM | POA: Diagnosis not present

## 2015-06-06 DIAGNOSIS — J9601 Acute respiratory failure with hypoxia: Secondary | ICD-10-CM | POA: Diagnosis not present

## 2015-06-06 DIAGNOSIS — R1031 Right lower quadrant pain: Secondary | ICD-10-CM | POA: Diagnosis present

## 2015-06-06 DIAGNOSIS — R739 Hyperglycemia, unspecified: Secondary | ICD-10-CM | POA: Diagnosis present

## 2015-06-06 DIAGNOSIS — Z809 Family history of malignant neoplasm, unspecified: Secondary | ICD-10-CM | POA: Diagnosis not present

## 2015-06-06 DIAGNOSIS — G4733 Obstructive sleep apnea (adult) (pediatric): Secondary | ICD-10-CM | POA: Diagnosis not present

## 2015-06-06 DIAGNOSIS — Z96641 Presence of right artificial hip joint: Secondary | ICD-10-CM | POA: Diagnosis present

## 2015-06-06 DIAGNOSIS — Z9011 Acquired absence of right breast and nipple: Secondary | ICD-10-CM | POA: Diagnosis not present

## 2015-06-06 DIAGNOSIS — K565 Intestinal adhesions [bands] with obstruction (postprocedural) (postinfection): Secondary | ICD-10-CM | POA: Diagnosis present

## 2015-06-06 DIAGNOSIS — Z853 Personal history of malignant neoplasm of breast: Secondary | ICD-10-CM | POA: Diagnosis not present

## 2015-06-06 DIAGNOSIS — J9811 Atelectasis: Secondary | ICD-10-CM | POA: Diagnosis not present

## 2015-06-06 LAB — COMPREHENSIVE METABOLIC PANEL
ALBUMIN: 3.7 g/dL (ref 3.5–5.0)
ALK PHOS: 47 U/L (ref 38–126)
ALT: 15 U/L (ref 14–54)
AST: 19 U/L (ref 15–41)
Anion gap: 10 (ref 5–15)
BUN: 15 mg/dL (ref 6–20)
CALCIUM: 8.6 mg/dL — AB (ref 8.9–10.3)
CHLORIDE: 113 mmol/L — AB (ref 101–111)
CO2: 21 mmol/L — AB (ref 22–32)
CREATININE: 0.75 mg/dL (ref 0.44–1.00)
GFR calc non Af Amer: >60 mL/min (ref >60)
GLUCOSE: 103 mg/dL — AB (ref 65–99)
Potassium: 4 mmol/L (ref 3.5–5.1)
SODIUM: 144 mmol/L (ref 135–145)
Total Bilirubin: 0.7 mg/dL (ref 0.3–1.2)
Total Protein: 6.3 g/dL — ABNORMAL LOW (ref 6.5–8.1)

## 2015-06-06 LAB — CBC
HCT: 39.9 % (ref 36.0–46.0)
Hemoglobin: 13.1 g/dL (ref 12.0–15.0)
MCH: 30.2 pg (ref 26.0–34.0)
MCHC: 32.8 g/dL (ref 30.0–36.0)
MCV: 91.9 fL (ref 78.0–100.0)
PLATELETS: 218 10*3/uL (ref 150–400)
RBC: 4.34 MIL/uL (ref 3.87–5.11)
RDW: 15 % (ref 11.5–15.5)
WBC: 59.8 10*3/uL (ref 4.0–10.5)

## 2015-06-06 LAB — GLUCOSE, CAPILLARY: GLUCOSE-CAPILLARY: 113 mg/dL — AB (ref 65–99)

## 2015-06-06 MED ORDER — KCL IN DEXTROSE-NACL 20-5-0.45 MEQ/L-%-% IV SOLN
INTRAVENOUS | Status: DC
  Administered 2015-06-06 – 2015-06-08 (×3): via INTRAVENOUS

## 2015-06-06 NOTE — Progress Notes (Signed)
CRITICAL VALUE ALERT  Critical value received:  WBC 59.8  Date of notification:  06/06/15  Time of notification:  0755  Critical value read back:Yes.    Nurse who received alert:  Theola Sequin RN  MD notified (1st page):  MD Opyd  Time of first page:  [phone]  MD notified (2nd page): MD Jerilee Hoh  Time of second page: [phone]  Responding MD:  MD Jerilee Hoh  Time MD responded:  [phone]

## 2015-06-06 NOTE — Consult Note (Signed)
Reason for Consult: Small bowel obstruction Referring Physician: Hospitalist  [name] is an 80 y.o. female.  HPI: Patient is a 80 year old white female with a history of stage III rest cancer and chronic lymphocytic leukemia who presented with a 24-hour history of worsening abdominal pain, nausea, and vomiting. She was admitted to the hospital for further evaluation treatment. CT scan of the abdomen reveals a small bowel obstruction most likely secondary to adhesive disease. No intra-abdominal masses were noted. She has had multiple abdominal surgeries in the remote past. She states this is her first episode. She did have a bowel movement 48 hours ago. She stated that that was normal. No recent colonoscopy has been noted. She has not had a bowel movement or passed flatus since her admission.  Past Medical History  Diagnosis Date  . CLL (chronic lymphocytic leukemia) ([place]) 07/21/2011  . Breast cancer, stage 3 ([place]) 07/21/2011  . Bronchitis     Past Surgical History  Procedure Laterality Date  . Appendectomy    . Abdominal hysterectomy    . Cholecystectomy    . Mastectomy      right  . Total hip arthroplasty      right    Family History  Problem Relation Age of Onset  . Cancer    . Cancer Brother     Social History:  reports that she has never smoked. She has never used smokeless tobacco. She reports that she does not drink alcohol or use illicit drugs.  Allergies:  Allergies  Allergen Reactions  . Codeine Nausea And Vomiting    headache  . Meclizine Other (See Comments)    Made dizziness worse.    Medications: I have reviewed the patient's current medications.  Results for orders placed or performed during the hospital encounter of 06/05/15 (from the past 48 hour(s))  Urinalysis, Routine w reflex microscopic (not at [facility])     Status: Abnormal   Collection Time: 06/05/15  1:03 PM  Result Value Ref Range   Color, Urine YELLOW YELLOW   APPearance CLEAR CLEAR    Specific Gravity, Urine >1.030 (H) 1.005 - 1.030   pH 5.5 5.0 - 8.0   Glucose, UA NEGATIVE NEGATIVE mg/dL   Hgb urine dipstick MODERATE (A) NEGATIVE   Bilirubin Urine NEGATIVE NEGATIVE   Ketones, ur TRACE (A) NEGATIVE mg/dL   Protein, ur TRACE (A) NEGATIVE mg/dL   Nitrite NEGATIVE NEGATIVE   Leukocytes, UA NEGATIVE NEGATIVE  Urine microscopic-add on     Status: Abnormal   Collection Time: 06/05/15  1:03 PM  Result Value Ref Range   Squamous Epithelial / LPF 0-5 (A) NONE SEEN   WBC, UA NONE SEEN 0 - 5 WBC/hpf   RBC / HPF 6-30 0 - 5 RBC/hpf   Bacteria, UA RARE (A) NONE SEEN  CBC with Differential/Platelet     Status: Abnormal   Collection Time: 06/05/15  1:09 PM  Result Value Ref Range   WBC 70.7 (HH) 4.0 - 10.5 K/uL    Comment: CRITICAL RESULT CALLED TO, READ BACK BY AND VERIFIED WITH: WHITE,M AT 1358 ON 06/05/2015 BY AGUNDIZ, E.     RBC 4.87 3.87 - 5.11 MIL/uL   Hemoglobin 14.5 12.0 - 15.0 g/dL   HCT 43.9 36.0 - 46.0 %   MCV 90.1 78.0 - 100.0 fL   MCH 29.8 26.0 - 34.0 pg   MCHC 33.0 30.0 - 36.0 g/dL   RDW 15.0 11.5 - 15.5 %   Platelets 235 150 - 400  K/uL   Neutrophils Relative % 22 %   Lymphocytes Relative 76 %   Monocytes Relative 2 %   Eosinophils Relative 0 %   Basophils Relative 0 %   Neutro Abs 15.6 (H) 1.7 - 7.7 K/uL   Lymphs Abs 53.7 (H) 0.7 - 4.0 K/uL   Monocytes Absolute 1.4 (H) 0.1 - 1.0 K/uL   Eosinophils Absolute 0.0 0.0 - 0.7 K/uL   Basophils Absolute 0.0 0.0 - 0.1 K/uL   WBC Morphology ATYPICAL LYMPHOCYTES     Comment: SMUDGE CELLS  Comprehensive metabolic panel     Status: Abnormal   Collection Time: 06/05/15  1:09 PM  Result Value Ref Range   Sodium 143 135 - 145 mmol/L   Potassium 3.8 3.5 - 5.1 mmol/L   Chloride 109 101 - 111 mmol/L   CO2 22 22 - 32 mmol/L   Glucose, Bld 134 (H) 65 - 99 mg/dL   BUN 17 6 - 20 mg/dL   Creatinine, Ser 0.84 0.44 - 1.00 mg/dL   Calcium 9.6 8.9 - 10.3 mg/dL   Total Protein 7.2 6.5 - 8.1 g/dL   Albumin 4.4 3.5 - 5.0  g/dL   AST 21 15 - 41 U/L   ALT 17 14 - 54 U/L   Alkaline Phosphatase 60 38 - 126 U/L   Total Bilirubin 0.8 0.3 - 1.2 mg/dL   GFR calc non Af Amer >60 >60 mL/min   GFR calc Af Amer >60 >60 mL/min    Comment: (NOTE) The eGFR has been calculated using the CKD EPI equation. This calculation has not been validated in all clinical situations. eGFR's persistently <60 mL/min signify possible Chronic Kidney Disease.    Anion gap 12 5 - 15  Lipase, blood     Status: None   Collection Time: 06/05/15  1:09 PM  Result Value Ref Range   Lipase 25 11 - 51 U/L  Comprehensive metabolic panel     Status: Abnormal   Collection Time: 06/06/15  5:56 AM  Result Value Ref Range   Sodium 144 135 - 145 mmol/L   Potassium 4.0 3.5 - 5.1 mmol/L   Chloride 113 (H) 101 - 111 mmol/L   CO2 21 (L) 22 - 32 mmol/L   Glucose, Bld 103 (H) 65 - 99 mg/dL   BUN 15 6 - 20 mg/dL   Creatinine, Ser 0.75 0.44 - 1.00 mg/dL   Calcium 8.6 (L) 8.9 - 10.3 mg/dL   Total Protein 6.3 (L) 6.5 - 8.1 g/dL   Albumin 3.7 3.5 - 5.0 g/dL   AST 19 15 - 41 U/L   ALT 15 14 - 54 U/L   Alkaline Phosphatase 47 38 - 126 U/L   Total Bilirubin 0.7 0.3 - 1.2 mg/dL   GFR calc non Af Amer >60 >60 mL/min   GFR calc Af Amer >60 >60 mL/min    Comment: (NOTE) The eGFR has been calculated using the CKD EPI equation. This calculation has not been validated in all clinical situations. eGFR's persistently <60 mL/min signify possible Chronic Kidney Disease.    Anion gap 10 5 - 15  CBC     Status: Abnormal   Collection Time: 06/06/15  5:56 AM  Result Value Ref Range   WBC 59.8 (HH) 4.0 - 10.5 K/uL    Comment: CRITICAL RESULT CALLED TO, READ BACK BY AND VERIFIED WITH: EVERETTE, R AT 0755 ON 06/06/15 WOODS,M    RBC 4.34 3.87 - 5.11 MIL/uL   Hemoglobin 13.1 12.0 -  15.0 g/dL   HCT 39.9 36.0 - 46.0 %   MCV 91.9 78.0 - 100.0 fL   MCH 30.2 26.0 - 34.0 pg   MCHC 32.8 30.0 - 36.0 g/dL   RDW 15.0 11.5 - 15.5 %   Platelets 218 150 - 400 K/uL   Glucose, capillary     Status: Abnormal   Collection Time: 06/06/15  8:02 AM  Result Value Ref Range   Glucose-Capillary 113 (H) 65 - 99 mg/dL   Comment 1 Notify RN     Ct Abdomen Pelvis W Contrast  06/05/2015  CLINICAL DATA:  Right lower quadrant pain starting today this morning, vomiting, history of leukemia, status post mastectomy for breast cancer EXAM: CT ABDOMEN AND PELVIS WITH CONTRAST TECHNIQUE: Multidetector CT imaging of the abdomen and pelvis was performed using the standard protocol following bolus administration of intravenous contrast. CONTRAST:  135m OMNIPAQUE IOHEXOL 300 MG/ML  SOLN COMPARISON:  Report CT scan 07/19/2002 no images available, lumbar spine x-ray 10/01/2013 FINDINGS: Sagittal images of the spine shows diffuse osteopenia. Mild disc space flattening with anterior spurring at L2-L3 level. Stable mild compression deformity upper endplate of TV76vertebral body. Mild fatty infiltration of the liver. No solid hepatic mass is noted. Atherosclerotic calcifications of mitral valve. Visualized lung bases are unremarkable. In mild intrahepatic biliary ductal dilatation probable postcholecystectomy. There is a cyst in right hepatic lobe posterior medially measures 6.5 mm. No aortic aneurysm. Enhanced pancreas, spleen and adrenal glands are unremarkable. Enhanced kidneys are symmetrical in size. No hydronephrosis or hydroureter. On there is a coarse calcified calculus in lower pole of the right kidney extending in right collecting system. Measures 2.1 cm x 1.1 cm. There is no evidence of obstruction. Mild dilatation of the right extrarenal pelvis. There is probable cortical scarring in lower pole of the right kidney. No hydronephrosis or hydroureter. No aortic aneurysm. There is no pericecal inflammation. Some liquid stool noted within cecum. The appendix is surgically absent. There are distended small bowel loops with fluid and some air-fluid levels. In axial image 42 there is mild  segmental dilatation of small bowel in right abdomen with fecal like material. This segment is best visualized in coronal image 52 measures at least 5.6 cm in length. There is mild adjacent fluid in small bowel mesentery and probable mild mesenteric edema at this level. Beyond this point the small bowel is smaller caliber as seen in coronal image 45. Findings are highly suspicious for small bowel obstruction. Distal small bowel loops are small caliber. The terminal ileum is decompressed small caliber. The transverse colon descending colon and proximal sigmoid colon is empty decompressed. Multiple sigmoid colon diverticula are noted. No evidence of acute diverticulitis. The patient is status post hysterectomy. Evaluation of the pelvis is limited by metallic artifacts from right hip prosthesis. The patient is status post right mastectomy. IMPRESSION: 1. There are dilated small bowel loops with multiple air-fluid levels. There is short segment distended small bowel containing fecal like material in right abdomen with small amount of adjacent mesenteric fluid and edema please see axial image 42. There is transition point of the caliber of small bowel at this level. Findings are highly suspicious for small bowel obstruction. 2. No pericecal inflammation.  Status post appendectomy. 3. Fatty infiltration of the liver. Status postcholecystectomy. A cyst is noted in right hepatic lobe measures 6.5 mm. 4. There is probable cortical scarring in lower pole of the right kidney. Large calcified nonobstructive calculus is noted in lower pole of the  right kidney measures at least 2.1 x 1.1 cm. No hydronephrosis or hydroureter. 5. Decompressed majority of the colon. Sigmoid colon diverticula are noted. No evidence of acute diverticulitis. 6. Status post hysterectomy. 7. Limited evaluation of the pelvis due to metallic artifacts from right hip prosthesis. These results were called by telephone at the time of interpretation on  06/05/2015 at 2:57 pm to Dr. Noemi Chapel , who verbally acknowledged these results. Electronically Signed   By: Lahoma Crocker M.D.   On: 06/05/2015 14:57    ROS: See chart Blood pressure 122/67, pulse 78, temperature 98.6 F (37 C), temperature source Oral, resp. rate 16, height _0  (1.575 m), weight 82.555 kg (182 lb), SpO2 95 %. Physical Exam: Pleasant white female in no acute distress. Abdomen is soft with nonspecific tenderness to deep palpation in the lower abdomen. No rigidity is noted. No hernias are noted. Rectal examination was deferred this time. Minimal bowel sounds appreciated.  Assessment/Plan: Impression: Small bowel obstruction most likely secondary to adhesive disease. Plan: No need for urgent surgical intervention, though if she does not improve over the next 24-48 hours, she may require an exploratory laparotomy. This was explained to the patient. Would continue NG tube decompression.  Elizette Shek A 06/06/2015, 10:29 AM

## 2015-06-06 NOTE — Progress Notes (Signed)
[facility] PROGRESS NOTE  [name] [id number] DOB: February 22, 1936 DOA: 06/05/2015 PCP: [name], MD  Assessment/Plan: Small bowel obstruction -Continue NG to suction, seen by surgery with plans for surgery in Monday if no clinical improvement. -She has a transition point on her CT scan, likely related to adhesions from prior abdominal surgeries.  CLL -Leukocytosis at baseline, as per last oncology visit is stable.  Obstructive sleep apnea -C Pap every at bedtime  Code Status: Full code Family Communication: Patient only  Disposition Plan: To be determined   Consultants:  Surgery, Dr. Arnoldo Morale   Antibiotics:  None   Subjective: Still complaining of abdominal pain, wants her NG out  Objective: Filed Vitals:   06/05/15 2345 06/06/15 0515 06/06/15 1329 06/06/15 1615  BP: 132/76 122/67 127/6   Pulse: 72 78 64   Temp: 97.8 F (36.6 C) 98.6 F (37 C) 97.5 F (36.4 C)   TempSrc: Oral Oral Oral   Resp: 16  16   Height:      Weight:      SpO2: 98% 95% 97% 93%    Intake/Output Summary (Last 24 hours) at 06/06/15 1639 Last data filed at 06/06/15 [id number]  Gross per 24 hour  Intake   1115 ml  Output      0 ml  Net   1115 ml   Filed Weights   06/05/15 1241 06/05/15 1655  Weight: 82.101 kg (181 lb) 82.555 kg (182 lb)    Exam:   General:  Awake, alert, oriented 3  Cardiovascular: Regular rate and rhythm  Respiratory: Clear to auscultation bilaterally  Abdomen: Soft, diffusely tender to palpation, hypoactive bowel sounds  Extremities: Trace bilateral edema   Neurologic:  Grossly intact and nonfocal  Data Reviewed: Basic Metabolic Panel:  Recent Labs Lab 06/05/15 1309 06/06/15 0556  NA 143 144  K 3.8 4.0  CL 109 113*  CO2 22 21*  GLUCOSE 134* 103*  BUN 17 15  CREATININE 0.84 0.75  CALCIUM 9.6 8.6*   Liver Function Tests:  Recent Labs Lab 06/05/15 1309 06/06/15 0556  AST 21 19  ALT 17 15  ALKPHOS 60 47    BILITOT 0.8 0.7  PROT 7.2 6.3*  ALBUMIN 4.4 3.7    Recent Labs Lab 06/05/15 1309  LIPASE 25   No results for input(s): AMMONIA in the last 168 hours. CBC:  Recent Labs Lab 06/05/15 1309 06/06/15 0556  WBC 70.7* 59.8*  NEUTROABS 15.6*  --   HGB 14.5 13.1  HCT 43.9 39.9  MCV 90.1 91.9  PLT 235 218   Cardiac Enzymes: No results for input(s): CKTOTAL, CKMB, CKMBINDEX, TROPONINI in the last 168 hours. BNP (last 3 results) No results for input(s): BNP in the last 8760 hours.  ProBNP (last 3 results) No results for input(s): PROBNP in the last 8760 hours.  CBG:  Recent Labs Lab 06/06/15 0802  GLUCAP 113*    No results found for this or any previous visit (from the past 240 hour(s)).   Studies: Ct Abdomen Pelvis W Contrast  06/05/2015  CLINICAL DATA:  Right lower quadrant pain starting today this morning, vomiting, history of leukemia, status post mastectomy for breast cancer EXAM: CT ABDOMEN AND PELVIS WITH CONTRAST TECHNIQUE: Multidetector CT imaging of the abdomen and pelvis was performed using the standard protocol following bolus administration of intravenous contrast. CONTRAST:  177mL OMNIPAQUE IOHEXOL 300 MG/ML  SOLN COMPARISON:  Report CT scan 07/19/2002 no images available, lumbar spine x-ray 10/01/2013 FINDINGS: Sagittal  images of the spine shows diffuse osteopenia. Mild disc space flattening with anterior spurring at L2-L3 level. Stable mild compression deformity upper endplate of 624THL vertebral body. Mild fatty infiltration of the liver. No solid hepatic mass is noted. Atherosclerotic calcifications of mitral valve. Visualized lung bases are unremarkable. In mild intrahepatic biliary ductal dilatation probable postcholecystectomy. There is a cyst in right hepatic lobe posterior medially measures 6.5 mm. No aortic aneurysm. Enhanced pancreas, spleen and adrenal glands are unremarkable. Enhanced kidneys are symmetrical in size. No hydronephrosis or hydroureter. On there  is a coarse calcified calculus in lower pole of the right kidney extending in right collecting system. Measures 2.1 cm x 1.1 cm. There is no evidence of obstruction. Mild dilatation of the right extrarenal pelvis. There is probable cortical scarring in lower pole of the right kidney. No hydronephrosis or hydroureter. No aortic aneurysm. There is no pericecal inflammation. Some liquid stool noted within cecum. The appendix is surgically absent. There are distended small bowel loops with fluid and some air-fluid levels. In axial image 42 there is mild segmental dilatation of small bowel in right abdomen with fecal like material. This segment is best visualized in coronal image 52 measures at least 5.6 cm in length. There is mild adjacent fluid in small bowel mesentery and probable mild mesenteric edema at this level. Beyond this point the small bowel is smaller caliber as seen in coronal image 45. Findings are highly suspicious for small bowel obstruction. Distal small bowel loops are small caliber. The terminal ileum is decompressed small caliber. The transverse colon descending colon and proximal sigmoid colon is empty decompressed. Multiple sigmoid colon diverticula are noted. No evidence of acute diverticulitis. The patient is status post hysterectomy. Evaluation of the pelvis is limited by metallic artifacts from right hip prosthesis. The patient is status post right mastectomy. IMPRESSION: 1. There are dilated small bowel loops with multiple air-fluid levels. There is short segment distended small bowel containing fecal like material in right abdomen with small amount of adjacent mesenteric fluid and edema please see axial image 42. There is transition point of the caliber of small bowel at this level. Findings are highly suspicious for small bowel obstruction. 2. No pericecal inflammation.  Status post appendectomy. 3. Fatty infiltration of the liver. Status postcholecystectomy. A cyst is noted in right hepatic  lobe measures 6.5 mm. 4. There is probable cortical scarring in lower pole of the right kidney. Large calcified nonobstructive calculus is noted in lower pole of the right kidney measures at least 2.1 x 1.1 cm. No hydronephrosis or hydroureter. 5. Decompressed majority of the colon. Sigmoid colon diverticula are noted. No evidence of acute diverticulitis. 6. Status post hysterectomy. 7. Limited evaluation of the pelvis due to metallic artifacts from right hip prosthesis. These results were called by telephone at the time of interpretation on 06/05/2015 at 2:57 pm to Dr. Noemi Chapel , who verbally acknowledged these results. Electronically Signed   By: Lahoma Crocker M.D.   On: 06/05/2015 14:57   Dg Chest Port 1 View  06/06/2015  CLINICAL DATA:  Hypertension, vomiting and small bowel obstruction EXAM: PORTABLE CHEST 1 VIEW COMPARISON:  04/10/2014 FINDINGS: NG tube extends into stomach. Normal cardiac silhouette. No effusion, infiltrate or pneumothorax. IMPRESSION: NG tube with tip in the gastric body. Electronically Signed   By: Suzy Bouchard M.D.   On: 06/06/2015 13:55    Scheduled Meds: . cholecalciferol  1,000 Units Oral Daily  . enoxaparin (LOVENOX) injection  40 mg Subcutaneous  Q24H  . gabapentin  100 mg Oral Daily   And  . gabapentin  200 mg Oral QHS  . megestrol  40 mg Oral Daily  . pantoprazole (PROTONIX) IV  40 mg Intravenous Q24H   Continuous Infusions: . dextrose 5 % and 0.45 % NaCl with KCl 20 mEq/L 100 mL/hr at 06/06/15 1053    Principal Problem:   SBO (small bowel obstruction) (H[facility]) Active Problems:   OSA (obstructive sleep apnea)   CLL (chronic lymphocytic leukemia) ([facility])   Hyperglycemia    Time spent: 25 minutes. Greater than 50% of this time was spent in direct contact with the patient coordinating care.    Lelon Frohlich  [facility] Pager [phone]  If 7PM-7AM, please contact night-coverage at [email or web address], password [facility] 06/06/2015, 4:39 PM  LOS: 0  days

## 2015-06-07 LAB — CBC
HCT: 38.7 % (ref 36.0–46.0)
HEMOGLOBIN: 12.5 g/dL (ref 12.0–15.0)
MCH: 29.8 pg (ref 26.0–34.0)
MCHC: 32.3 g/dL (ref 30.0–36.0)
MCV: 92.1 fL (ref 78.0–100.0)
PLATELETS: 197 10*3/uL (ref 150–400)
RBC: 4.2 MIL/uL (ref 3.87–5.11)
RDW: 15.3 % (ref 11.5–15.5)
WBC: 48.2 10*3/uL — AB (ref 4.0–10.5)

## 2015-06-07 LAB — GLUCOSE, CAPILLARY: Glucose-Capillary: 116 mg/dL — ABNORMAL HIGH (ref 65–99)

## 2015-06-07 LAB — BASIC METABOLIC PANEL
ANION GAP: 8 (ref 5–15)
BUN: 14 mg/dL (ref 6–20)
CHLORIDE: 108 mmol/L (ref 101–111)
CO2: 26 mmol/L (ref 22–32)
Calcium: 8.7 mg/dL — ABNORMAL LOW (ref 8.9–10.3)
Creatinine, Ser: 0.8 mg/dL (ref 0.44–1.00)
Glucose, Bld: 128 mg/dL — ABNORMAL HIGH (ref 65–99)
POTASSIUM: 4.3 mmol/L (ref 3.5–5.1)
SODIUM: 142 mmol/L (ref 135–145)

## 2015-06-07 LAB — MAGNESIUM: MAGNESIUM: 2 mg/dL (ref 1.7–2.4)

## 2015-06-07 LAB — PHOSPHORUS: PHOSPHORUS: 2.7 mg/dL (ref 2.5–4.6)

## 2015-06-07 MED ORDER — MAGNESIUM HYDROXIDE 400 MG/5ML PO SUSP
30.0000 mL | Freq: Two times a day (BID) | ORAL | Status: DC
  Administered 2015-06-07 (×2): 30 mL via ORAL
  Filled 2015-06-07 (×2): qty 30

## 2015-06-07 MED ORDER — BISACODYL 10 MG RE SUPP
10.0000 mg | Freq: Two times a day (BID) | RECTAL | Status: DC
  Administered 2015-06-07 (×2): 10 mg via RECTAL
  Filled 2015-06-07 (×3): qty 1

## 2015-06-07 NOTE — Progress Notes (Signed)
Patient refuses CPAP 

## 2015-06-07 NOTE — Progress Notes (Signed)
[facility] PROGRESS NOTE  [name] [id number] DOB: 03-18-1936 DOA: 06/05/2015 PCP: [name], MD  Assessment/Plan: Small bowel obstruction -Seems to be improving. -Pain has resolved and is passing flatus. -NG has been removed. -On clears.  CLL -Leukocytosis at baseline, as per last oncology visit is stable.  Obstructive sleep apnea -C Pap every at bedtime  Code Status: Full code Family Communication: Patient only  Disposition Plan: To be determined   Consultants:  Surgery, Dr. Arnoldo Morale   Antibiotics:  None   Subjective: Still complaining of abdominal pain, wants her NG out  Objective: Filed Vitals:   06/06/15 1329 06/06/15 1615 06/06/15 2024 06/07/15 0636  BP: 127/6  136/67 135/69  Pulse: 64  63 60  Temp: 97.5 F (36.4 C)  98.2 F (36.8 C) 98.1 F (36.7 C)  TempSrc: Oral  Oral Oral  Resp: 16  20 20   Height:      Weight:      SpO2: 97% 93% 94% 94%   No intake or output data in the 24 hours ending 06/07/15 1226 Filed Weights   06/05/15 1241 06/05/15 1655  Weight: 82.101 kg (181 lb) 82.555 kg (182 lb)    Exam:   General:  Awake, alert, oriented 3  Cardiovascular: Regular rate and rhythm  Respiratory: Clear to auscultation bilaterally  Abdomen: Soft, diffusely tender to palpation, hypoactive bowel sounds  Extremities: Trace bilateral edema   Neurologic:  Grossly intact and nonfocal  Data Reviewed: Basic Metabolic Panel:  Recent Labs Lab 06/05/15 1309 06/06/15 0556 06/07/15 0629  NA 143 144 142  K 3.8 4.0 4.3  CL 109 113* 108  CO2 22 21* 26  GLUCOSE 134* 103* 128*  BUN 17 15 14   CREATININE 0.84 0.75 0.80  CALCIUM 9.6 8.6* 8.7*  MG  --   --  2.0  PHOS  --   --  2.7   Liver Function Tests:  Recent Labs Lab 06/05/15 1309 06/06/15 0556  AST 21 19  ALT 17 15  ALKPHOS 60 47  BILITOT 0.8 0.7  PROT 7.2 6.3*  ALBUMIN 4.4 3.7    Recent Labs Lab 06/05/15 1309  LIPASE 25   No results for  input(s): AMMONIA in the last 168 hours. CBC:  Recent Labs Lab 06/05/15 1309 06/06/15 0556 06/07/15 0629  WBC 70.7* 59.8* 48.2*  NEUTROABS 15.6*  --   --   HGB 14.5 13.1 12.5  HCT 43.9 39.9 38.7  MCV 90.1 91.9 92.1  PLT 235 218 197   Cardiac Enzymes: No results for input(s): CKTOTAL, CKMB, CKMBINDEX, TROPONINI in the last 168 hours. BNP (last 3 results) No results for input(s): BNP in the last 8760 hours.  ProBNP (last 3 results) No results for input(s): PROBNP in the last 8760 hours.  CBG:  Recent Labs Lab 06/06/15 0802 06/07/15 0806  GLUCAP 113* 116*    No results found for this or any previous visit (from the past 240 hour(s)).   Studies: Ct Abdomen Pelvis W Contrast  06/05/2015  CLINICAL DATA:  Right lower quadrant pain starting today this morning, vomiting, history of leukemia, status post mastectomy for breast cancer EXAM: CT ABDOMEN AND PELVIS WITH CONTRAST TECHNIQUE: Multidetector CT imaging of the abdomen and pelvis was performed using the standard protocol following bolus administration of intravenous contrast. CONTRAST:  140mL OMNIPAQUE IOHEXOL 300 MG/ML  SOLN COMPARISON:  Report CT scan 07/19/2002 no images available, lumbar spine x-ray 10/01/2013 FINDINGS: Sagittal images of the spine shows diffuse osteopenia. Mild  disc space flattening with anterior spurring at L2-L3 level. Stable mild compression deformity upper endplate of 624THL vertebral body. Mild fatty infiltration of the liver. No solid hepatic mass is noted. Atherosclerotic calcifications of mitral valve. Visualized lung bases are unremarkable. In mild intrahepatic biliary ductal dilatation probable postcholecystectomy. There is a cyst in right hepatic lobe posterior medially measures 6.5 mm. No aortic aneurysm. Enhanced pancreas, spleen and adrenal glands are unremarkable. Enhanced kidneys are symmetrical in size. No hydronephrosis or hydroureter. On there is a coarse calcified calculus in lower pole of the  right kidney extending in right collecting system. Measures 2.1 cm x 1.1 cm. There is no evidence of obstruction. Mild dilatation of the right extrarenal pelvis. There is probable cortical scarring in lower pole of the right kidney. No hydronephrosis or hydroureter. No aortic aneurysm. There is no pericecal inflammation. Some liquid stool noted within cecum. The appendix is surgically absent. There are distended small bowel loops with fluid and some air-fluid levels. In axial image 42 there is mild segmental dilatation of small bowel in right abdomen with fecal like material. This segment is best visualized in coronal image 52 measures at least 5.6 cm in length. There is mild adjacent fluid in small bowel mesentery and probable mild mesenteric edema at this level. Beyond this point the small bowel is smaller caliber as seen in coronal image 45. Findings are highly suspicious for small bowel obstruction. Distal small bowel loops are small caliber. The terminal ileum is decompressed small caliber. The transverse colon descending colon and proximal sigmoid colon is empty decompressed. Multiple sigmoid colon diverticula are noted. No evidence of acute diverticulitis. The patient is status post hysterectomy. Evaluation of the pelvis is limited by metallic artifacts from right hip prosthesis. The patient is status post right mastectomy. IMPRESSION: 1. There are dilated small bowel loops with multiple air-fluid levels. There is short segment distended small bowel containing fecal like material in right abdomen with small amount of adjacent mesenteric fluid and edema please see axial image 42. There is transition point of the caliber of small bowel at this level. Findings are highly suspicious for small bowel obstruction. 2. No pericecal inflammation.  Status post appendectomy. 3. Fatty infiltration of the liver. Status postcholecystectomy. A cyst is noted in right hepatic lobe measures 6.5 mm. 4. There is probable cortical  scarring in lower pole of the right kidney. Large calcified nonobstructive calculus is noted in lower pole of the right kidney measures at least 2.1 x 1.1 cm. No hydronephrosis or hydroureter. 5. Decompressed majority of the colon. Sigmoid colon diverticula are noted. No evidence of acute diverticulitis. 6. Status post hysterectomy. 7. Limited evaluation of the pelvis due to metallic artifacts from right hip prosthesis. These results were called by telephone at the time of interpretation on 06/05/2015 at 2:57 pm to Dr. Noemi Chapel , who verbally acknowledged these results. Electronically Signed   By: Lahoma Crocker M.D.   On: 06/05/2015 14:57   Dg Chest Port 1 View  06/06/2015  CLINICAL DATA:  Hypertension, vomiting and small bowel obstruction EXAM: PORTABLE CHEST 1 VIEW COMPARISON:  04/10/2014 FINDINGS: NG tube extends into stomach. Normal cardiac silhouette. No effusion, infiltrate or pneumothorax. IMPRESSION: NG tube with tip in the gastric body. Electronically Signed   By: Suzy Bouchard M.D.   On: 06/06/2015 13:55    Scheduled Meds: . bisacodyl  10 mg Rectal BID AC  . cholecalciferol  1,000 Units Oral Daily  . enoxaparin (LOVENOX) injection  40 mg  Subcutaneous Q24H  . gabapentin  100 mg Oral Daily   And  . gabapentin  200 mg Oral QHS  . magnesium hydroxide  30 mL Oral q12n4p  . megestrol  40 mg Oral Daily  . pantoprazole (PROTONIX) IV  40 mg Intravenous Q24H   Continuous Infusions: . dextrose 5 % and 0.45 % NaCl with KCl 20 mEq/L 75 mL/hr (06/07/15 1202)    Principal Problem:   SBO (small bowel obstruction) (HCC) Active Problems:   OSA (obstructive sleep apnea)   CLL (chronic lymphocytic leukemia) (HCC)   Hyperglycemia    Time spent: 25 minutes. Greater than 50% of this time was spent in direct contact with the patient coordinating care.    Lelon Frohlich  [facility] Pager [phone]  If 7PM-7AM, please contact night-coverage at [email or web address], password  [facility] 06/07/2015, 12:26 PM  LOS: 1 day

## 2015-06-07 NOTE — Progress Notes (Signed)
Subjective: Abdominal pain has resolved. Patient feels better. Has passed flatus.  Objective: Vital signs in last 24 hours: Temp:  [97.5 F (36.4 C)-98.2 F (36.8 C)] 98.1 F (36.7 C) (01/15 0636) Pulse Rate:  [60-64] 60 (01/15 0636) Resp:  [16-20] 20 (01/15 0636) BP: (127-136)/(6-69) 135/69 mmHg (01/15 0636) SpO2:  [93 %-97 %] 94 % (01/15 0636) Last BM Date: 06/05/15  Intake/Output from previous day:   Intake/Output this shift:    General appearance: alert, cooperative and no distress GI: Soft. Nontender, nondistended. Occasional bowel sounds appreciated.  Lab Results:   Recent Labs  06/06/15 0556 06/07/15 0629  WBC 59.8* 48.2*  HGB 13.1 12.5  HCT 39.9 38.7  PLT 218 197   BMET  Recent Labs  06/06/15 0556 06/07/15 0629  NA 144 142  K 4.0 4.3  CL 113* 108  CO2 21* 26  GLUCOSE 103* 128*  BUN 15 14  CREATININE 0.75 0.80  CALCIUM 8.6* 8.7*   PT/INR No results for input(s): LABPROT, INR in the last 72 hours.  Studies/Results: Ct Abdomen Pelvis W Contrast  06/05/2015  CLINICAL DATA:  Right lower quadrant pain starting today this morning, vomiting, history of leukemia, status post mastectomy for breast cancer EXAM: CT ABDOMEN AND PELVIS WITH CONTRAST TECHNIQUE: Multidetector CT imaging of the abdomen and pelvis was performed using the standard protocol following bolus administration of intravenous contrast. CONTRAST:  146mL OMNIPAQUE IOHEXOL 300 MG/ML  SOLN COMPARISON:  Report CT scan 07/19/2002 no images available, lumbar spine x-ray 10/01/2013 FINDINGS: Sagittal images of the spine shows diffuse osteopenia. Mild disc space flattening with anterior spurring at L2-L3 level. Stable mild compression deformity upper endplate of 624THL vertebral body. Mild fatty infiltration of the liver. No solid hepatic mass is noted. Atherosclerotic calcifications of mitral valve. Visualized lung bases are unremarkable. In mild intrahepatic biliary ductal dilatation probable  postcholecystectomy. There is a cyst in right hepatic lobe posterior medially measures 6.5 mm. No aortic aneurysm. Enhanced pancreas, spleen and adrenal glands are unremarkable. Enhanced kidneys are symmetrical in size. No hydronephrosis or hydroureter. On there is a coarse calcified calculus in lower pole of the right kidney extending in right collecting system. Measures 2.1 cm x 1.1 cm. There is no evidence of obstruction. Mild dilatation of the right extrarenal pelvis. There is probable cortical scarring in lower pole of the right kidney. No hydronephrosis or hydroureter. No aortic aneurysm. There is no pericecal inflammation. Some liquid stool noted within cecum. The appendix is surgically absent. There are distended small bowel loops with fluid and some air-fluid levels. In axial image 42 there is mild segmental dilatation of small bowel in right abdomen with fecal like material. This segment is best visualized in coronal image 52 measures at least 5.6 cm in length. There is mild adjacent fluid in small bowel mesentery and probable mild mesenteric edema at this level. Beyond this point the small bowel is smaller caliber as seen in coronal image 45. Findings are highly suspicious for small bowel obstruction. Distal small bowel loops are small caliber. The terminal ileum is decompressed small caliber. The transverse colon descending colon and proximal sigmoid colon is empty decompressed. Multiple sigmoid colon diverticula are noted. No evidence of acute diverticulitis. The patient is status post hysterectomy. Evaluation of the pelvis is limited by metallic artifacts from right hip prosthesis. The patient is status post right mastectomy. IMPRESSION: 1. There are dilated small bowel loops with multiple air-fluid levels. There is short segment distended small bowel containing fecal like material  in right abdomen with small amount of adjacent mesenteric fluid and edema please see axial image 42. There is transition  point of the caliber of small bowel at this level. Findings are highly suspicious for small bowel obstruction. 2. No pericecal inflammation.  Status post appendectomy. 3. Fatty infiltration of the liver. Status postcholecystectomy. A cyst is noted in right hepatic lobe measures 6.5 mm. 4. There is probable cortical scarring in lower pole of the right kidney. Large calcified nonobstructive calculus is noted in lower pole of the right kidney measures at least 2.1 x 1.1 cm. No hydronephrosis or hydroureter. 5. Decompressed majority of the colon. Sigmoid colon diverticula are noted. No evidence of acute diverticulitis. 6. Status post hysterectomy. 7. Limited evaluation of the pelvis due to metallic artifacts from right hip prosthesis. These results were called by telephone at the time of interpretation on 06/05/2015 at 2:57 pm to Dr. Noemi Chapel , who verbally acknowledged these results. Electronically Signed   By: Lahoma Crocker M.D.   On: 06/05/2015 14:57   Dg Chest Port 1 View  06/06/2015  CLINICAL DATA:  Hypertension, vomiting and small bowel obstruction EXAM: PORTABLE CHEST 1 VIEW COMPARISON:  04/10/2014 FINDINGS: NG tube extends into stomach. Normal cardiac silhouette. No effusion, infiltrate or pneumothorax. IMPRESSION: NG tube with tip in the gastric body. Electronically Signed   By: Suzy Bouchard M.D.   On: 06/06/2015 13:55    Anti-infectives: Anti-infectives    None      Assessment/Plan: Impression: Small bowel obstruction, possibly starting to resolve. Plan: Will remove gastric tube and start clear liquid diet. Will give cathartics. Will encourage patient ambulate and sit in chair. No need for surgery at this time.  LOS: 1 day    Baine Decesare A 06/07/2015

## 2015-06-08 ENCOUNTER — Inpatient Hospital Stay (HOSPITAL_COMMUNITY): Payer: Medicare HMO

## 2015-06-08 ENCOUNTER — Encounter (HOSPITAL_COMMUNITY)
Admission: EM | Disposition: A | Discharge status: Home / Self Care | Payer: Self-pay | Source: Home / Self Care | Attending: Internal Medicine

## 2015-06-08 ENCOUNTER — Encounter (HOSPITAL_COMMUNITY): Payer: Self-pay | Admitting: *Deleted

## 2015-06-08 ENCOUNTER — Inpatient Hospital Stay (HOSPITAL_COMMUNITY): Payer: Medicare HMO | Admitting: Anesthesiology

## 2015-06-08 HISTORY — PX: LAPAROTOMY: SHX154

## 2015-06-08 HISTORY — PX: LYSIS OF ADHESION: SHX5961

## 2015-06-08 LAB — BLOOD GAS, ARTERIAL
ACID-BASE EXCESS: 1.3 mmol/L (ref 0.0–2.0)
Bicarbonate: 25.4 mEq/L — ABNORMAL HIGH (ref 20.0–24.0)
Drawn by: 25788
FIO2: 40
LHR: 12 {breaths}/min
O2 Saturation: 95.1 %
PEEP: 5 cmH2O
VT: 450 mL
pCO2 arterial: 41.7 mmHg (ref 35.0–45.0)
pH, Arterial: 7.405 (ref 7.350–7.450)
pO2, Arterial: 78.3 mmHg — ABNORMAL LOW (ref 80.0–100.0)

## 2015-06-08 LAB — TROPONIN I: TROPONIN I: 0.03 ng/mL (ref <0.031)

## 2015-06-08 LAB — GLUCOSE, CAPILLARY: Glucose-Capillary: 103 mg/dL — ABNORMAL HIGH (ref 65–99)

## 2015-06-08 LAB — SURGICAL PCR SCREEN
MRSA, PCR: NEGATIVE
Staphylococcus aureus: NEGATIVE

## 2015-06-08 SURGERY — LAPAROTOMY, EXPLORATORY
Anesthesia: General

## 2015-06-08 MED ORDER — CHLORHEXIDINE GLUCONATE 0.12% ORAL RINSE (MEDLINE KIT)
15.0000 mL | Freq: Two times a day (BID) | OROMUCOSAL | Status: DC
  Administered 2015-06-08 – 2015-06-10 (×5): 15 mL via OROMUCOSAL

## 2015-06-08 MED ORDER — PROPOFOL 10 MG/ML IV BOLUS
INTRAVENOUS | Status: DC | PRN
  Administered 2015-06-08: 100 mg via INTRAVENOUS

## 2015-06-08 MED ORDER — ANTISEPTIC ORAL RINSE SOLUTION (CORINZ)
7.0000 mL | Freq: Four times a day (QID) | OROMUCOSAL | Status: DC
  Administered 2015-06-09 – 2015-06-11 (×6): 7 mL via OROMUCOSAL

## 2015-06-08 MED ORDER — GLYCOPYRROLATE 0.2 MG/ML IJ SOLN
INTRAMUSCULAR | Status: DC | PRN
  Administered 2015-06-08: 0.6 mg via INTRAVENOUS
  Administered 2015-06-08: .2 mg via INTRAVENOUS

## 2015-06-08 MED ORDER — MIDAZOLAM HCL 2 MG/2ML IJ SOLN
INTRAMUSCULAR | Status: AC
  Filled 2015-06-08: qty 2

## 2015-06-08 MED ORDER — NEOSTIGMINE METHYLSULFATE 10 MG/10ML IV SOLN
INTRAVENOUS | Status: DC | PRN
  Administered 2015-06-08: 3 mg via INTRAVENOUS
  Administered 2015-06-08 (×2): 1 mg via INTRAVENOUS

## 2015-06-08 MED ORDER — IPRATROPIUM BROMIDE 0.02 % IN SOLN
0.5000 mg | RESPIRATORY_TRACT | Status: DC | PRN
  Filled 2015-06-08: qty 2.5

## 2015-06-08 MED ORDER — LACTATED RINGERS IV SOLN
INTRAVENOUS | Status: DC
  Administered 2015-06-08: 14:00:00 via INTRAVENOUS

## 2015-06-08 MED ORDER — KETOROLAC TROMETHAMINE 30 MG/ML IJ SOLN: 30.0000 mg | Freq: Once | INTRAMUSCULAR | Status: DC

## 2015-06-08 MED ORDER — ONDANSETRON HCL 4 MG/2ML IJ SOLN
INTRAMUSCULAR | Status: AC
  Filled 2015-06-08: qty 2

## 2015-06-08 MED ORDER — CIPROFLOXACIN IN D5W 400 MG/200ML IV SOLN
400.0000 mg | INTRAVENOUS | Status: AC
  Administered 2015-06-08: 400 mg via INTRAVENOUS
  Filled 2015-06-08: qty 200

## 2015-06-08 MED ORDER — GLYCOPYRROLATE 0.2 MG/ML IJ SOLN
INTRAMUSCULAR | Status: AC
  Filled 2015-06-08: qty 1

## 2015-06-08 MED ORDER — SODIUM CHLORIDE 0.9 % IJ SOLN
INTRAMUSCULAR | Status: AC
  Filled 2015-06-08: qty 10

## 2015-06-08 MED ORDER — FENTANYL CITRATE (PF) 100 MCG/2ML IJ SOLN
50.0000 ug | INTRAMUSCULAR | Status: DC | PRN
  Administered 2015-06-08 – 2015-06-09 (×3): 50 ug via INTRAVENOUS
  Filled 2015-06-08 (×3): qty 2

## 2015-06-08 MED ORDER — EPHEDRINE SULFATE 50 MG/ML IJ SOLN
INTRAMUSCULAR | Status: AC
  Filled 2015-06-08: qty 1

## 2015-06-08 MED ORDER — ROCURONIUM BROMIDE 50 MG/5ML IV SOLN
INTRAVENOUS | Status: AC
  Filled 2015-06-08: qty 1

## 2015-06-08 MED ORDER — LIDOCAINE HCL (PF) 1 % IJ SOLN
INTRAMUSCULAR | Status: AC
  Filled 2015-06-08: qty 10

## 2015-06-08 MED ORDER — LIDOCAINE HCL (CARDIAC) 10 MG/ML IV SOLN
INTRAVENOUS | Status: DC | PRN
  Administered 2015-06-08: 50 mg via INTRAVENOUS

## 2015-06-08 MED ORDER — FENTANYL CITRATE (PF) 100 MCG/2ML IJ SOLN
INTRAMUSCULAR | Status: AC
  Filled 2015-06-08: qty 2

## 2015-06-08 MED ORDER — ROCURONIUM BROMIDE 100 MG/10ML IV SOLN
INTRAVENOUS | Status: DC | PRN
  Administered 2015-06-08: 5 mg via INTRAVENOUS
  Administered 2015-06-08: 20 mg via INTRAVENOUS

## 2015-06-08 MED ORDER — ETOMIDATE 2 MG/ML IV SOLN
INTRAVENOUS | Status: DC | PRN
  Administered 2015-06-08: 10 mg via INTRAVENOUS

## 2015-06-08 MED ORDER — ALBUTEROL SULFATE (2.5 MG/3ML) 0.083% IN NEBU
INHALATION_SOLUTION | RESPIRATORY_TRACT | Status: AC
  Filled 2015-06-08: qty 3

## 2015-06-08 MED ORDER — SUCCINYLCHOLINE CHLORIDE 20 MG/ML IJ SOLN
INTRAMUSCULAR | Status: AC
  Filled 2015-06-08: qty 1

## 2015-06-08 MED ORDER — GLYCOPYRROLATE 0.2 MG/ML IJ SOLN
INTRAMUSCULAR | Status: AC
  Filled 2015-06-08: qty 4

## 2015-06-08 MED ORDER — MIDAZOLAM HCL 2 MG/2ML IJ SOLN
1.0000 mg | INTRAMUSCULAR | Status: DC | PRN
  Administered 2015-06-08: 2 mg via INTRAVENOUS

## 2015-06-08 MED ORDER — SUCCINYLCHOLINE CHLORIDE 20 MG/ML IJ SOLN
INTRAMUSCULAR | Status: DC | PRN
  Administered 2015-06-08: 120 mg via INTRAVENOUS
  Administered 2015-06-08: 100 mg via INTRAVENOUS

## 2015-06-08 MED ORDER — POVIDONE-IODINE 10 % EX OINT
TOPICAL_OINTMENT | CUTANEOUS | Status: DC | PRN
  Administered 2015-06-08: 1 via TOPICAL

## 2015-06-08 MED ORDER — ONDANSETRON HCL 4 MG/2ML IJ SOLN
4.0000 mg | Freq: Once | INTRAMUSCULAR | Status: AC
  Administered 2015-06-08: 4 mg via INTRAVENOUS

## 2015-06-08 MED ORDER — LACTATED RINGERS IV SOLN
INTRAVENOUS | Status: DC
  Administered 2015-06-08: 18:00:00 via INTRAVENOUS

## 2015-06-08 MED ORDER — SODIUM CHLORIDE 0.9 % IR SOLN
Status: DC | PRN
  Administered 2015-06-08: 1000 mL

## 2015-06-08 MED ORDER — NEOSTIGMINE METHYLSULFATE 10 MG/10ML IV SOLN
INTRAVENOUS | Status: AC
  Filled 2015-06-08: qty 1

## 2015-06-08 MED ORDER — FENTANYL CITRATE (PF) 100 MCG/2ML IJ SOLN
INTRAMUSCULAR | Status: DC | PRN
  Administered 2015-06-08 (×2): 25 ug via INTRAVENOUS
  Administered 2015-06-08: 50 ug via INTRAVENOUS

## 2015-06-08 MED ORDER — LABETALOL HCL 5 MG/ML IV SOLN
INTRAVENOUS | Status: DC | PRN
  Administered 2015-06-08: 10 mg via INTRAVENOUS

## 2015-06-08 MED ORDER — LABETALOL HCL 5 MG/ML IV SOLN
INTRAVENOUS | Status: AC
  Filled 2015-06-08: qty 4

## 2015-06-08 MED ORDER — EPHEDRINE SULFATE 50 MG/ML IJ SOLN
INTRAMUSCULAR | Status: DC | PRN
  Administered 2015-06-08: 10 mg via INTRAVENOUS

## 2015-06-08 MED ORDER — POVIDONE-IODINE 10 % EX OINT
TOPICAL_OINTMENT | CUTANEOUS | Status: AC
  Filled 2015-06-08: qty 2

## 2015-06-08 MED ORDER — FENTANYL CITRATE (PF) 100 MCG/2ML IJ SOLN
50.0000 ug | INTRAMUSCULAR | Status: AC | PRN
  Administered 2015-06-08 (×3): 50 ug via INTRAVENOUS
  Filled 2015-06-08 (×3): qty 2

## 2015-06-08 SURGICAL SUPPLY — 61 items
APPLIER CLIP 11 MED OPEN (CLIP)
APPLIER CLIP 13 LRG OPEN (CLIP)
APR CLP LRG 13 20 CLIP (CLIP)
APR CLP MED 11 20 MLT OPN (CLIP)
BAG HAMPER (MISCELLANEOUS) ×2 IMPLANT
BARRIER SKIN 2 3/4 (OSTOMY) IMPLANT
BARRIER SKIN OD2.25 2 3/4 FLNG (OSTOMY) IMPLANT
BRR SKN FLT 2.75X2.25 2 PC (OSTOMY)
CHLORAPREP W/TINT 26ML (MISCELLANEOUS) ×2 IMPLANT
CLAMP POUCH DRAINAGE QUIET (OSTOMY) IMPLANT
CLIP APPLIE 11 MED OPEN (CLIP) IMPLANT
CLIP APPLIE 13 LRG OPEN (CLIP) IMPLANT
CLOTH BEACON ORANGE TIMEOUT ST (SAFETY) ×2 IMPLANT
COVER LIGHT HANDLE STERIS (MISCELLANEOUS) ×4 IMPLANT
DRAPE WARM FLUID 44X44 (DRAPE) ×2 IMPLANT
DRSG OPSITE POSTOP 4X10 (GAUZE/BANDAGES/DRESSINGS) ×1 IMPLANT
DRSG OPSITE POSTOP 4X8 (GAUZE/BANDAGES/DRESSINGS) ×1 IMPLANT
ELECT BLADE 6 FLAT ULTRCLN (ELECTRODE) IMPLANT
ELECT REM PT RETURN 9FT ADLT (ELECTROSURGICAL) ×2
ELECTRODE REM PT RTRN 9FT ADLT (ELECTROSURGICAL) ×1 IMPLANT
GAUZE SPONGE 4X4 12PLY STRL (GAUZE/BANDAGES/DRESSINGS) ×2 IMPLANT
GLOVE BIOGEL M 7.0 STRL (GLOVE) ×1 IMPLANT
GLOVE BIOGEL PI IND STRL 7.0 (GLOVE) ×2 IMPLANT
GLOVE BIOGEL PI INDICATOR 7.0 (GLOVE) ×2
GLOVE EXAM NITRILE MD LF STRL (GLOVE) ×1 IMPLANT
GLOVE SURG SS PI 7.5 STRL IVOR (GLOVE) ×6 IMPLANT
GOWN STRL REUS W/TWL LRG LVL3 (GOWN DISPOSABLE) ×6 IMPLANT
INST SET MAJOR GENERAL (KITS) ×2 IMPLANT
KIT REMOVER STAPLE SKIN (MISCELLANEOUS) IMPLANT
KIT ROOM TURNOVER APOR (KITS) ×2 IMPLANT
LIGASURE IMPACT 36 18CM CVD LR (INSTRUMENTS) IMPLANT
MANIFOLD NEPTUNE II (INSTRUMENTS) ×2 IMPLANT
NS IRRIG 1000ML POUR BTL (IV SOLUTION) ×3 IMPLANT
PACK ABDOMINAL MAJOR (CUSTOM PROCEDURE TRAY) ×2 IMPLANT
PAD ARMBOARD 7.5X6 YLW CONV (MISCELLANEOUS) ×2 IMPLANT
POUCH OSTOMY 2 3/4  H 3804 (WOUND CARE)
POUCH OSTOMY 2 3/4 H 3804 (WOUND CARE)
POUCH OSTOMY 2 PC DRNBL 2.75 (WOUND CARE) IMPLANT
RELOAD LINEAR CUT PROX 55 BLUE (ENDOMECHANICALS) IMPLANT
RELOAD PROXIMATE 75MM BLUE (ENDOMECHANICALS) IMPLANT
RELOAD STAPLE 55 3.8 BLU REG (ENDOMECHANICALS) IMPLANT
RELOAD STAPLE 75 3.8 BLU REG (ENDOMECHANICALS) IMPLANT
RETRACTOR WND ALEXIS 25 LRG (MISCELLANEOUS) IMPLANT
RTRCTR WOUND ALEXIS 25CM LRG (MISCELLANEOUS)
SET BASIN LINEN APH (SET/KITS/TRAYS/PACK) ×2 IMPLANT
SPONGE LAP 18X18 X RAY DECT (DISPOSABLE) ×2 IMPLANT
STAPLER GUN LINEAR PROX 60 (STAPLE) IMPLANT
STAPLER PROXIMATE 55 BLUE (STAPLE) IMPLANT
STAPLER PROXIMATE 75MM BLUE (STAPLE) IMPLANT
STAPLER VISISTAT (STAPLE) ×2 IMPLANT
SUCTION POOLE TIP (SUCTIONS) ×2 IMPLANT
SUT CHROMIC 0 SH (SUTURE) IMPLANT
SUT CHROMIC 2 0 SH (SUTURE) IMPLANT
SUT CHROMIC 3 0 SH 27 (SUTURE) ×1 IMPLANT
SUT NOVA NAB GS-26 0 60 (SUTURE) ×4 IMPLANT
SUT PROLENE 2 0 SH 30 (SUTURE) ×1 IMPLANT
SUT SILK 2 0 (SUTURE)
SUT SILK 2 0 REEL (SUTURE) ×1 IMPLANT
SUT SILK 2-0 18XBRD TIE 12 (SUTURE) ×1 IMPLANT
SUT SILK 3 0 SH CR/8 (SUTURE) ×1 IMPLANT
TRAY FOLEY CATH SILVER 16FR (SET/KITS/TRAYS/PACK) ×2 IMPLANT

## 2015-06-08 NOTE — Progress Notes (Signed)
NG tube inserted by previous nurse.  700CC of fluid recorded.

## 2015-06-08 NOTE — Op Note (Signed)
Patient:  [name]  DOB:  [birth year]  MRN:  [id number]   Preop Diagnosis:   Small bowel obstruction  Postop Diagnosis:   same  Procedure:   Exploratory laparotomy, lysis of adhesions  Surgeon:   Aviva Signs, M.D.  Anes:   General endotracheal  Indications:   Patient is a 80 year old white female who presents with a small bowel obstruction which was confirmed by CT scan of the abdomen. The risks and benefits of the procedure including bleeding, infection, cardiopulmonary difficulties, and the possibility of a bowel resection were fully explained to the patient, who gave informed consent.  Procedure note:   The patient is placed the supine position. After induction of general endotracheal anesthesia, the abdomen was prepped and draped using the usual sterile technique with DuraPrep. Surgical site confirmation was performed.  A midline incision was made from above the umbilicus to  Just below the umbilicus. The peritoneal cavity was entered into without difficulty. A small amount of free fluid was noted to the peritoneal cavity. This was clear. On exteriorization of the bowel from the ligament of Treitz to the terminal ileum, there was an adhesive band which was causing an internal herniation. There was some compromise of the mesentery of the small bowel with some mild cyanosis present. Once this adhesion was freed from the right lower quadrant, the bowel pinked up in showed peristalsis. The rest of the distal small bowel was within normal limits. No abnormal lesions were noted. This appeared to be somewhat chronic in nature, though the area of the bowel which had been adhesion present did not compromise, thus not requiring a partial small bowel resection. The abdominal cavity was copiously irrigated with normal saline. No other abnormalities were noted. The NG tube was replaced with a 16 French 2 and the tip of the catheter was noted to be within the stomach on palpation. The bowel was  returned into the abdominal cavity an orderly fashion. The fascia was reapproximated using a looped 0 Novafil running suture. The subcutaneous layer was irrigated with normal saline and the skin was closed using staples. Betadine ointment and dry sterile dressings were applied.  All tape and needle counts were correct at the end of the procedure. The patient was extubated in the operating room and transferred to PACU in stable condition.  Complications:   none  EBL:   minimal  Specimen:   none

## 2015-06-08 NOTE — Anesthesia Preprocedure Evaluation (Addendum)
Anesthesia Evaluation  Patient identified by MRN, date of birth, ID band Patient awake    Reviewed: Allergy & Precautions, NPO status , Patient's Chart, lab work & pertinent test results  Airway Mallampati: III  TM Distance: >3 FB     Dental  (+) Edentulous Upper, Partial Lower   Pulmonary    breath sounds clear to auscultation       Cardiovascular negative cardio ROS   Rhythm:Regular Rate:Normal     Neuro/Psych PSYCHIATRIC DISORDERS Anxiety Depression    GI/Hepatic SBO   Endo/Other    Renal/GU      Musculoskeletal   Abdominal   Peds  Hematology   Anesthesia Other Findings   Reproductive/Obstetrics                             Anesthesia Physical Anesthesia Plan  ASA: III  Anesthesia Plan: General   Post-op Pain Management:    Induction: Intravenous, Rapid sequence and Cricoid pressure planned  Airway Management Planned: Oral ETT  Additional Equipment:   Intra-op Plan:   Post-operative Plan: Extubation in OR  Informed Consent: I have reviewed the patients History and Physical, chart, labs and discussed the procedure including the risks, benefits and alternatives for the proposed anesthesia with the patient or authorized representative who has indicated his/her understanding and acceptance.     Plan Discussed with:   Anesthesia Plan Comments: (06/08/2015  1602 Shortly after arrival in PACU, patient complaining of difficulty breathing. Strong  Hand grips.  Breathing treatment given.Respiratory distress increasing, couplet PVC's.  Decision made to intubate and transferred to ICU.  Sherrill Raring CRNA)       Anesthesia Quick Evaluation

## 2015-06-08 NOTE — Progress Notes (Signed)
Patient brought to ICU 8 stat under the supervision of 2 PACU RNs. Patient intubated and receiving bag respirations. RT present to connect patient to ventilator. Symmetrical chest rise and bilateral breathe sounds noted. No bowel sounds at this time. Honeycomb dressing dry and intact. Sister at bedside received update from Dr. Arnoldo Morale.

## 2015-06-08 NOTE — Progress Notes (Signed)
Patient had to have NG tube reinserted due to multiple episodes of emesis. She has failed conservative therapy. We will take patient to the operating room today for exploratory laparotomy due to small bowel obstruction. The risks and benefits of the procedure including bleeding, infection, cardiopulmonary difficulties, and the possibility of bowel resection were fully explained to the patient, who gave informed consent.

## 2015-06-08 NOTE — Progress Notes (Signed)
Patient developed hypertension, multiple arrhythmias including bigeminy and PVCs, and decreased mentation, requiring reintubation. Will get chest x-ray, 12-lead EKG, troponin. Will be transferred to the ICU. She is stable at this time. Will reevaluate extubation in a.m.

## 2015-06-08 NOTE — Progress Notes (Signed)
Dr Patsey Berthold remains at bedside. resp labored and O2 sat 88 cardiac rhythm bigeminy PVC's, coupled. Resp assisted with ambu bag per Dr Patsey Berthold.

## 2015-06-08 NOTE — Progress Notes (Signed)
Reintubated per Sherrill Raring CRNA. Resp per ambu bag continued. CO2 47.

## 2015-06-08 NOTE — Progress Notes (Signed)
From OR. [place]. Oriented to place per nurse. resp labored. States "I can't breath." Reassurance given. BBS clear and diminished in upper lobes and absent in bilateral lower lobes. Sherrill Raring CRNA aware. O2 continued via NRB mask at 10 l/m.

## 2015-06-08 NOTE — Progress Notes (Signed)
[facility] PROGRESS NOTE  [name] [id number] DOB: [birth year] DOA: 06/05/2015 PCP: [name], MD  Assessment/Plan: Small bowel obstruction -Worsened overnight with continued emesis requiring re-insertion of NG tube with immediate 700 cc return. -Seen by Dr. Arnoldo Morale with plans to take to OR today.  CLL -Leukocytosis at baseline, as per last oncology visit is stable.  Obstructive sleep apnea -C Pap every at bedtime  Code Status: Full code Family Communication: Patient only  Disposition Plan: To be determined   Consultants:  Surgery, Dr. Arnoldo Morale   Antibiotics:  None   Subjective: Overnight events noted. To OR today. No current abdominal pain.  Objective: Filed Vitals:   06/07/15 0636 06/07/15 1330 06/08/15 0522 06/08/15 0838  BP: 135/69 115/67 99/62   Pulse: 60 65 54   Temp: 98.1 F (36.7 C) 98.7 F (37.1 C) 98.4 F (36.9 C)   TempSrc: Oral Oral Oral   Resp: 20 18 20    Height:      Weight:      SpO2: 94% 97% 94% 95%    Intake/Output Summary (Last 24 hours) at 06/08/15 1151 Last data filed at 06/08/15 0800  Gross per 24 hour  Intake      0 ml  Output    700 ml  Net   -700 ml   Filed Weights   06/05/15 1241 06/05/15 1655  Weight: 82.101 kg (181 lb) 82.555 kg (182 lb)    Exam:   General:  Awake, alert, oriented 3  Cardiovascular: Regular rate and rhythm  Respiratory: Clear to auscultation bilaterally  Abdomen: Soft, diffusely tender to palpation, hypoactive bowel sounds  Extremities: Trace bilateral edema   Neurologic:  Grossly intact and nonfocal  Data Reviewed: Basic Metabolic Panel:  Recent Labs Lab 06/05/15 1309 06/06/15 0556 06/07/15 0629  NA 143 144 142  K 3.8 4.0 4.3  CL 109 113* 108  CO2 22 21* 26  GLUCOSE 134* 103* 128*  BUN 17 15 14   CREATININE 0.84 0.75 0.80  CALCIUM 9.6 8.6* 8.7*  MG  --   --  2.0  PHOS  --   --  2.7   Liver Function Tests:  Recent Labs Lab 06/05/15 1309  06/06/15 0556  AST 21 19  ALT 17 15  ALKPHOS 60 47  BILITOT 0.8 0.7  PROT 7.2 6.3*  ALBUMIN 4.4 3.7    Recent Labs Lab 06/05/15 1309  LIPASE 25   No results for input(s): AMMONIA in the last 168 hours. CBC:  Recent Labs Lab 06/05/15 1309 06/06/15 0556 06/07/15 0629  WBC 70.7* 59.8* 48.2*  NEUTROABS 15.6*  --   --   HGB 14.5 13.1 12.5  HCT 43.9 39.9 38.7  MCV 90.1 91.9 92.1  PLT 235 218 197   Cardiac Enzymes: No results for input(s): CKTOTAL, CKMB, CKMBINDEX, TROPONINI in the last 168 hours. BNP (last 3 results) No results for input(s): BNP in the last 8760 hours.  ProBNP (last 3 results) No results for input(s): PROBNP in the last 8760 hours.  CBG:  Recent Labs Lab 06/06/15 0802 06/07/15 0806 06/08/15 0746  GLUCAP 113* 116* 103*    No results found for this or any previous visit (from the past 240 hour(s)).   Studies: Dg Chest 1 View  06/08/2015  CLINICAL DATA:  80 year old female with right breast cancer and chronic lymphocytic leukemia. Enteric tube placement. EXAM: CHEST 1 VIEW COMPARISON:  Radiograph dated 06/06/2015 FINDINGS: An enteric tube is noted with tip beyond the image cut  off. The lungs are clear. Stable cardiomegaly. The osseous structures appear unremarkable. IMPRESSION: Partially visualized enteric tube with tip beyond the image cut off. Electronically Signed   By: Anner Crete M.D.   On: 06/08/2015 02:20   Dg Chest Port 1 View  06/06/2015  CLINICAL DATA:  Hypertension, vomiting and small bowel obstruction EXAM: PORTABLE CHEST 1 VIEW COMPARISON:  04/10/2014 FINDINGS: NG tube extends into stomach. Normal cardiac silhouette. No effusion, infiltrate or pneumothorax. IMPRESSION: NG tube with tip in the gastric body. Electronically Signed   By: Suzy Bouchard M.D.   On: 06/06/2015 13:55    Scheduled Meds: . bisacodyl  10 mg Rectal BID AC  . cholecalciferol  1,000 Units Oral Daily  . ciprofloxacin  400 mg Intravenous On Call to OR  .  enoxaparin (LOVENOX) injection  40 mg Subcutaneous Q24H  . gabapentin  100 mg Oral Daily   And  . gabapentin  200 mg Oral QHS  . magnesium hydroxide  30 mL Oral q12n4p  . megestrol  40 mg Oral Daily  . pantoprazole (PROTONIX) IV  40 mg Intravenous Q24H   Continuous Infusions: . dextrose 5 % and 0.45 % NaCl with KCl 20 mEq/L 75 mL/hr at 06/08/15 0533    Principal Problem:   SBO (small bowel obstruction) (HCC) Active Problems:   OSA (obstructive sleep apnea)   CLL (chronic lymphocytic leukemia) (HCC)   Hyperglycemia    Time spent: 25 minutes. Greater than 50% of this time was spent in direct contact with the patient coordinating care.    Lelon Frohlich  [facility] Pager [phone]  If 7PM-7AM, please contact night-coverage at [email or web address], password [facility] 06/08/2015, 11:51 AM  LOS: 2 days

## 2015-06-08 NOTE — Progress Notes (Signed)
Albuterol tx done .

## 2015-06-08 NOTE — Anesthesia Procedure Notes (Addendum)
Procedure Name: Intubation Date/Time: 06/08/2015 2:27 PM Performed by: Vista Deck Pre-anesthesia Checklist: Patient identified, Patient being monitored, Timeout performed, Emergency Drugs available and Suction available Patient Re-evaluated:Patient Re-evaluated prior to inductionOxygen Delivery Method: Circle System Utilized Preoxygenation: Pre-oxygenation with 100% oxygen Intubation Type: IV induction, Rapid sequence and Cricoid Pressure applied Ventilation: Mask ventilation without difficulty Laryngoscope Size: Miller and 2 Grade View: Grade I Tube type: Oral Tube size: 7.0 mm Number of attempts: 1 Airway Equipment and Method: Stylet Placement Confirmation: ETT inserted through vocal cords under direct vision,  positive ETCO2 and breath sounds checked- equal and bilateral Secured at: 21 cm Tube secured with: Tape Dental Injury: Teeth and Oropharynx as per pre-operative assessment    Date/Time: 06/08/2015 3:49 PM Performed by: Vista Deck Pre-anesthesia Checklist: Patient identified, Emergency Drugs available, Suction available, Patient being monitored and Timeout performed Patient Re-evaluated:Patient Re-evaluated prior to inductionOxygen Delivery Method: Ambu bag Preoxygenation: Pre-oxygenation with 100% oxygen Intubation Type: IV induction and Cricoid Pressure applied Ventilation: Mask ventilation without difficulty Laryngoscope Size: Miller and 2 Grade View: Grade I Tube type: Subglottic suction tube Tube size: 7.0 mm Number of attempts: 1 Airway Equipment and Method: Stylet Placement Confirmation: ETT inserted through vocal cords under direct vision,  positive ETCO2 and breath sounds checked- equal and bilateral (ETCO 2 checked on ZOLL ) Secured at: 23 cm Tube secured with: Tape Dental Injury: Teeth and Oropharynx as per pre-operative assessment

## 2015-06-08 NOTE — Progress Notes (Signed)
Dr Patsey Berthold at bedside. Order given for albuterol tx,

## 2015-06-08 NOTE — Progress Notes (Signed)
Transferred to ICU-8 with cardiac monitor intact. Monitor displays NSR with occasional PVC. Resp main tained with ambu bag per nurse. Side rails up x3.

## 2015-06-08 NOTE — Transfer of Care (Signed)
Immediate Anesthesia Transfer of Care Note  Patient: [name]  Procedure(s) Performed: Procedure(s): EXPLORATORY LAPAROTOMY (N/A)  Patient Location: PACU  Anesthesia Type:General  Level of Consciousness: awake and patient cooperative  Airway & Oxygen Therapy: Patient Spontanous Breathing and non-rebreather face mask  Post-op Assessment: Report given to RN, Post -op Vital signs reviewed and stable and Patient moving all extremities  Post vital signs: Reviewed and stable  Last Vitals:  Filed Vitals:   06/08/15 1400 06/08/15 1405  BP: 149/83 136/80  Pulse:    Temp:    Resp: 17 19    Complications: No apparent anesthesia complications

## 2015-06-08 NOTE — Addendum Note (Signed)
Addendum  created 06/08/15 1636 by Vista Deck, CRNA   Modules edited: Anesthesia Blocks and Procedures, Charges VN, Clinical Notes   Clinical Notes:  File: [id number]

## 2015-06-08 NOTE — Progress Notes (Signed)
Dr Arnoldo Morale at bedside. Pt condition discussed. Okay to proceed with transfer to ICU.

## 2015-06-08 NOTE — Progress Notes (Signed)
Dr Patsey Berthold talked with Dr Arnoldo Morale re: pt status. Instructed would need ICU bed.

## 2015-06-08 NOTE — Anesthesia Postprocedure Evaluation (Signed)
Anesthesia Post Note  Patient: [name]  Procedure(s) Performed: Procedure(s) (LRB): EXPLORATORY LAPAROTOMY (N/A)  Patient location during evaluation: PACU Anesthesia Type: General Post-procedure mental status: re-intubated. Pain management: pain level controlled Vital Signs Assessment: post-procedure vital signs reviewed and stable Respiratory status: patient on ventilator - see flowsheet for VS Cardiovascular status: stable Anesthetic complications: yes Anesthetic complication details: required intubation and respiratory event   Last Vitals:  Filed Vitals:   06/08/15 1545 06/08/15 1550  BP: 146/104 142/85  Pulse: 38 98  Temp:    Resp: 25 22    Last Pain:  Filed Vitals:   06/08/15 1555  PainSc: 2                  [name]

## 2015-06-08 NOTE — Progress Notes (Signed)
CXR done

## 2015-06-09 LAB — TROPONIN I

## 2015-06-09 LAB — BASIC METABOLIC PANEL
Anion gap: 11 (ref 5–15)
BUN: 17 mg/dL (ref 6–20)
CALCIUM: 8.4 mg/dL — AB (ref 8.9–10.3)
CHLORIDE: 106 mmol/L (ref 101–111)
CO2: 25 mmol/L (ref 22–32)
CREATININE: 0.84 mg/dL (ref 0.44–1.00)
GFR calc Af Amer: >60 mL/min (ref >60)
GFR calc non Af Amer: >60 mL/min (ref >60)
GLUCOSE: 119 mg/dL — AB (ref 65–99)
Potassium: 4.2 mmol/L (ref 3.5–5.1)
Sodium: 142 mmol/L (ref 135–145)

## 2015-06-09 LAB — CBC
HCT: 45.1 % (ref 36.0–46.0)
Hemoglobin: 14.6 g/dL (ref 12.0–15.0)
MCH: 29.6 pg (ref 26.0–34.0)
MCHC: 32.4 g/dL (ref 30.0–36.0)
MCV: 91.3 fL (ref 78.0–100.0)
PLATELETS: 229 10*3/uL (ref 150–400)
RBC: 4.94 MIL/uL (ref 3.87–5.11)
RDW: 15.1 % (ref 11.5–15.5)
WBC: 58.3 10*3/uL (ref 4.0–10.5)

## 2015-06-09 LAB — GLUCOSE, CAPILLARY: Glucose-Capillary: 110 mg/dL — ABNORMAL HIGH (ref 65–99)

## 2015-06-09 MED ORDER — IPRATROPIUM BROMIDE 0.02 % IN SOLN: 0.5000 mg | RESPIRATORY_TRACT | Status: DC | PRN

## 2015-06-09 MED ORDER — KCL IN DEXTROSE-NACL 20-5-0.45 MEQ/L-%-% IV SOLN
INTRAVENOUS | Status: DC
  Administered 2015-06-09 – 2015-06-10 (×2): via INTRAVENOUS

## 2015-06-09 NOTE — Progress Notes (Signed)
Called report to Threasa Alpha, RN on dept 300. Verbalized understanding. Pt transferred to room 328 in safe and stable condition.

## 2015-06-09 NOTE — Anesthesia Postprocedure Evaluation (Signed)
Anesthesia Post Note  Patient: [name]  Procedure(s) Performed: Procedure(s) (LRB): EXPLORATORY LAPAROTOMY (N/A)  Patient location during evaluation: ICU Anesthesia Type: General Level of consciousness: awake and alert Pain management: pain level controlled Vital Signs Assessment: post-procedure vital signs reviewed and stable Respiratory status: patient re-intubated Cardiovascular status: stable Anesthetic complications: yes (Patient required reintubation postop secondary to respiratory distress; plan is to extubate patient this a.m.; VSS throughout the night.;;) Anesthetic complication details: required intubation   Last Vitals:  Filed Vitals:   06/09/15 0400 06/09/15 0500  BP: 108/65 103/70  Pulse: 81 68  Temp: 36.7 C   Resp: 17 12    Last Pain:  Filed Vitals:   06/09/15 0553  PainSc: Asleep                 ADAMS, AMY A

## 2015-06-09 NOTE — Progress Notes (Signed)
1 Day Post-Op  Subjective: Patient easily arousable. Motions that her breathing is fine.  Objective: Vital signs in last 24 hours: Temp:  [97 F (36.1 C)-98.1 F (36.7 C)] 98.1 F (36.7 C) (01/17 0400) Pulse Rate:  [38-104] 68 (01/17 0500) Resp:  [12-26] 12 (01/17 0500) BP: (103-188)/(65-138) 103/70 mmHg (01/17 0500) SpO2:  [87 %-100 %] 98 % (01/17 0500) FiO2 (%):  [40 %] 40 % (01/17 0334) Weight:  [83.1 kg (183 lb 3.2 oz)] 83.1 kg (183 lb 3.2 oz) (01/17 0500) Last BM Date: 06/07/15  Intake/Output from previous day: 01/16 0701 - 01/17 0700 In: 1546.7 [I.V.:1546.7] Out: 420 [Urine:400; Blood:20] Intake/Output this shift:    General appearance: alert, cooperative and no distress Resp: Intubated, clear breath sounds bilaterally. Cardio: regular rate and rhythm, S1, S2 normal, no murmur, click, rub or gallop GI: Soft. Dressing dry and intact.  Lab Results:   Recent Labs  06/07/15 0629 06/09/15 0405  WBC 48.2* 58.3*  HGB 12.5 14.6  HCT 38.7 45.1  PLT 197 229   BMET  Recent Labs  06/07/15 0629 06/09/15 0405  NA 142 142  K 4.3 4.2  CL 108 106  CO2 26 25  GLUCOSE 128* 119*  BUN 14 17  CREATININE 0.80 0.84  CALCIUM 8.7* 8.4*   PT/INR No results for input(s): LABPROT, INR in the last 72 hours.  Studies/Results: Dg Chest 1 View  06/08/2015  CLINICAL DATA:  80 year old female with right breast cancer and chronic lymphocytic leukemia. Enteric tube placement. EXAM: CHEST 1 VIEW COMPARISON:  Radiograph dated 06/06/2015 FINDINGS: An enteric tube is noted with tip beyond the image cut off. The lungs are clear. Stable cardiomegaly. The osseous structures appear unremarkable. IMPRESSION: Partially visualized enteric tube with tip beyond the image cut off. Electronically Signed   By: Anner Crete M.D.   On: 06/08/2015 02:20   Dg Chest Portable 1 View  06/08/2015  CLINICAL DATA:  Difficulty breathing postoperatively, personal history of breast cancer stage III and  chronic lymphocytic leukemia EXAM: PORTABLE CHEST 1 VIEW COMPARISON:  06/08/2015 FINDINGS: Enlarged cardiac silhouette. Orogastric tube extends into the stomach. Endotracheal tube identified with tip about 3 cm above the carina. Retrocardiac left lower lobe opacity consistent with consolidation. Right lung is clear. IMPRESSION: Lines and tubes as described. Left lower lobe consolidation could represent atelectasis or pneumonitis. Electronically Signed   By: Skipper Cliche M.D.   On: 06/08/2015 16:14    Anti-infectives: Anti-infectives    Start     Dose/Rate Route Frequency Ordered Stop   06/08/15 0800  ciprofloxacin (CIPRO) IVPB 400 mg     400 mg 200 mL/hr over 60 Minutes Intravenous On call to O.R. 06/08/15 0750 06/08/15 1435      Assessment/Plan: s/p Procedure(s): EXPLORATORY LAPAROTOMY Impression: Postoperative intubation due to respiratory depression. Patient has been weaned overnight and appears ready for extubation. Adjust IV fluids.  LOS: 3 days    Kylyn Mcdade A 06/09/2015

## 2015-06-09 NOTE — Addendum Note (Signed)
Addendum  created 06/09/15 [id number] by Mickel Baas, CRNA   Modules edited: Clinical Notes   Clinical Notes:  File: [id number]

## 2015-06-09 NOTE — Progress Notes (Signed)
ET tube pulled per Dr. Arnoldo Morale. Pt was intubated after surgery because she was not waking up. Pt was extubated to a 2L [place]. NIF -30, FVC 1L, followed direction and was able to write questions on paper. Pt was extubated per Dr. Arnoldo Morale with just NIF and FVC,Leak test and following directions.

## 2015-06-09 NOTE — Progress Notes (Signed)
[facility] PROGRESS NOTE  [name] [id number] DOB: 01/21/1936 DOA: 06/05/2015 PCP: [name], MD  Assessment/Plan: Small bowel obstruction -S/p Ex Lap 123456. -Complicated by somnolence requiring re-intubation in PACU, likely due to anesthesia effect. -Has been extubated this am without incident. -Transfer to floor. -Clear liquids today.  CLL -Leukocytosis at baseline, as per last oncology visit is stable.  Obstructive sleep apnea -C Pap every at bedtime  Code Status: Full code Family Communication: Patient only  Disposition Plan: Transfer to floor later today.   Consultants:  Surgery, Dr. Arnoldo Morale   Antibiotics:  None   Subjective: Extubated. Feels well. Anxious to be discharged home.  Objective: Filed Vitals:   06/09/15 0500 06/09/15 0600 06/09/15 0700 06/09/15 0800  BP: 103/70 115/77 126/78 95/50  Pulse: 68 78 82 78  Temp:      TempSrc:      Resp: 12 14 20 16   Height:      Weight: 83.1 kg (183 lb 3.2 oz)     SpO2: 98% 99% 97% 98%    Intake/Output Summary (Last 24 hours) at 06/09/15 1004 Last data filed at 06/09/15 0700  Gross per 24 hour  Intake 1646.67 ml  Output    420 ml  Net 1226.67 ml   Filed Weights   06/05/15 1241 06/05/15 1655 06/09/15 0500  Weight: 82.101 kg (181 lb) 82.555 kg (182 lb) 83.1 kg (183 lb 3.2 oz)    Exam:   General:  Awake, alert, oriented 3  Cardiovascular: Regular rate and rhythm  Respiratory: Clear to auscultation bilaterally  Abdomen: Soft, diffusely tender to palpation, hypoactive bowel sounds  Extremities: Trace bilateral edema   Neurologic:  Grossly intact and nonfocal  Data Reviewed: Basic Metabolic Panel:  Recent Labs Lab 06/05/15 1309 06/06/15 0556 06/07/15 0629 06/09/15 0405  NA 143 144 142 142  K 3.8 4.0 4.3 4.2  CL 109 113* 108 106  CO2 22 21* 26 25  GLUCOSE 134* 103* 128* 119*  BUN 17 15 14 17   CREATININE 0.84 0.75 0.80 0.84  CALCIUM 9.6 8.6* 8.7* 8.4*    MG  --   --  2.0  --   PHOS  --   --  2.7  --    Liver Function Tests:  Recent Labs Lab 06/05/15 1309 06/06/15 0556  AST 21 19  ALT 17 15  ALKPHOS 60 47  BILITOT 0.8 0.7  PROT 7.2 6.3*  ALBUMIN 4.4 3.7    Recent Labs Lab 06/05/15 1309  LIPASE 25   No results for input(s): AMMONIA in the last 168 hours. CBC:  Recent Labs Lab 06/05/15 1309 06/06/15 0556 06/07/15 0629 06/09/15 0405  WBC 70.7* 59.8* 48.2* 58.3*  NEUTROABS 15.6*  --   --   --   HGB 14.5 13.1 12.5 14.6  HCT 43.9 39.9 38.7 45.1  MCV 90.1 91.9 92.1 91.3  PLT 235 218 197 229   Cardiac Enzymes:  Recent Labs Lab 06/08/15 1701 06/08/15 2138 06/09/15 0405  TROPONINI <0.03 0.03 <0.03   BNP (last 3 results) No results for input(s): BNP in the last 8760 hours.  ProBNP (last 3 results) No results for input(s): PROBNP in the last 8760 hours.  CBG:  Recent Labs Lab 06/06/15 0802 06/07/15 0806 06/08/15 0746 06/09/15 0745  GLUCAP 113* 116* 103* 110*    Recent Results (from the past 240 hour(s))  Surgical pcr screen     Status: None   Collection Time: 06/08/15 10:48 AM  Result Value  Ref Range Status   MRSA, PCR NEGATIVE NEGATIVE Final   Staphylococcus aureus NEGATIVE NEGATIVE Final    Comment:        The Xpert SA Assay (FDA approved for NASAL specimens in patients over 44 years of age), is one component of a comprehensive surveillance program.  Test performance has been validated by [facility] for patients greater than or equal to 46 year old. It is not intended to diagnose infection nor to guide or monitor treatment.      Studies: Dg Chest 1 View  06/08/2015  CLINICAL DATA:  80 year old female with right breast cancer and chronic lymphocytic leukemia. Enteric tube placement. EXAM: CHEST 1 VIEW COMPARISON:  Radiograph dated 06/06/2015 FINDINGS: An enteric tube is noted with tip beyond the image cut off. The lungs are clear. Stable cardiomegaly. The osseous structures appear  unremarkable. IMPRESSION: Partially visualized enteric tube with tip beyond the image cut off. Electronically Signed   By: Anner Crete M.D.   On: 06/08/2015 02:20   Dg Chest Portable 1 View  06/08/2015  CLINICAL DATA:  Difficulty breathing postoperatively, personal history of breast cancer stage III and chronic lymphocytic leukemia EXAM: PORTABLE CHEST 1 VIEW COMPARISON:  06/08/2015 FINDINGS: Enlarged cardiac silhouette. Orogastric tube extends into the stomach. Endotracheal tube identified with tip about 3 cm above the carina. Retrocardiac left lower lobe opacity consistent with consolidation. Right lung is clear. IMPRESSION: Lines and tubes as described. Left lower lobe consolidation could represent atelectasis or pneumonitis. Electronically Signed   By: Skipper Cliche M.D.   On: 06/08/2015 16:14    Scheduled Meds: . antiseptic oral rinse  7 mL Mouth Rinse QID  . chlorhexidine gluconate  15 mL Mouth Rinse BID  . enoxaparin (LOVENOX) injection  40 mg Subcutaneous Q24H  . pantoprazole (PROTONIX) IV  40 mg Intravenous Q24H   Continuous Infusions: . dextrose 5 % and 0.45 % NaCl with KCl 20 mEq/L 75 mL/hr at 06/09/15 0856    Principal Problem:   SBO (small bowel obstruction) (HCC) Active Problems:   OSA (obstructive sleep apnea)   CLL (chronic lymphocytic leukemia) (HCC)   Hyperglycemia    Time spent: 25 minutes. Greater than 50% of this time was spent in direct contact with the patient coordinating care.    Lelon Frohlich  [facility] Pager [phone]  If 7PM-7AM, please contact night-coverage at [email or web address], password [facility] 06/09/2015, 10:04 AM  LOS: 3 days

## 2015-06-09 NOTE — Procedures (Signed)
Extubation Procedure Note  Patient Details:   Name: [name] DOB: [birth year] MRN: [id number]   Airway Documentation:  Airway 7 mm (Active)  Secured at (cm) 22 cm 06/09/2015  3:34 AM  Measured From Lips 06/09/2015  3:34 AM  Secured Location Left 06/09/2015  3:34 AM  Secured By [employer] 06/09/2015  3:34 AM  Tube Holder Repositioned Yes 06/09/2015  3:34 AM  Site Condition Dry 06/09/2015  3:34 AM    Evaluation  O2 sats: stable throughout Complications: No apparent complications Patient did tolerate procedure well. Bilateral Breath Sounds: Clear Suctioning: Oral, Airway Yes  Elsie Stain 06/09/2015, 8:13 AM

## 2015-06-10 ENCOUNTER — Encounter (HOSPITAL_COMMUNITY): Payer: Self-pay | Admitting: General Surgery

## 2015-06-10 DIAGNOSIS — K5669 Other intestinal obstruction: Secondary | ICD-10-CM

## 2015-06-10 DIAGNOSIS — C9110 Chronic lymphocytic leukemia of B-cell type not having achieved remission: Secondary | ICD-10-CM

## 2015-06-10 LAB — CBC
HCT: 38.2 % (ref 36.0–46.0)
Hemoglobin: 12.4 g/dL (ref 12.0–15.0)
MCH: 29.8 pg (ref 26.0–34.0)
MCHC: 32.5 g/dL (ref 30.0–36.0)
MCV: 91.8 fL (ref 78.0–100.0)
PLATELETS: 191 10*3/uL (ref 150–400)
RBC: 4.16 MIL/uL (ref 3.87–5.11)
RDW: 15.1 % (ref 11.5–15.5)
WBC: 43.6 10*3/uL — AB (ref 4.0–10.5)

## 2015-06-10 LAB — BASIC METABOLIC PANEL
ANION GAP: 8 (ref 5–15)
BUN: 12 mg/dL (ref 6–20)
CHLORIDE: 107 mmol/L (ref 101–111)
CO2: 27 mmol/L (ref 22–32)
Calcium: 8.2 mg/dL — ABNORMAL LOW (ref 8.9–10.3)
Creatinine, Ser: 0.77 mg/dL (ref 0.44–1.00)
GFR calc Af Amer: >60 mL/min (ref >60)
GLUCOSE: 114 mg/dL — AB (ref 65–99)
POTASSIUM: 3.6 mmol/L (ref 3.5–5.1)
Sodium: 142 mmol/L (ref 135–145)

## 2015-06-10 LAB — GLUCOSE, CAPILLARY: GLUCOSE-CAPILLARY: 103 mg/dL — AB (ref 65–99)

## 2015-06-10 LAB — MAGNESIUM: Magnesium: 2.1 mg/dL (ref 1.7–2.4)

## 2015-06-10 LAB — PHOSPHORUS: Phosphorus: 2.7 mg/dL (ref 2.5–4.6)

## 2015-06-10 MED ORDER — MAGNESIUM HYDROXIDE 400 MG/5ML PO SUSP
30.0000 mL | Freq: Two times a day (BID) | ORAL | Status: DC
  Administered 2015-06-10: 30 mL via ORAL
  Filled 2015-06-10 (×2): qty 30

## 2015-06-10 MED ORDER — ALBUTEROL SULFATE (2.5 MG/3ML) 0.083% IN NEBU
2.5000 mg | INHALATION_SOLUTION | Freq: Three times a day (TID) | RESPIRATORY_TRACT | Status: DC
  Administered 2015-06-10 – 2015-06-12 (×5): 2.5 mg via RESPIRATORY_TRACT
  Filled 2015-06-10 (×5): qty 3

## 2015-06-10 MED ORDER — HYDROMORPHONE HCL 1 MG/ML IJ SOLN
0.5000 mg | INTRAMUSCULAR | Status: DC | PRN
  Administered 2015-06-10: 0.5 mg via INTRAVENOUS
  Filled 2015-06-10: qty 1

## 2015-06-10 NOTE — Discharge Summary (Signed)
Physician Discharge Summary  [name] [id number] DOB: December 17, 1935 DOA: 06/05/2015  PCP: [name], MD  Admit date: 06/05/2015 Discharge date: 06/10/2015  Recommendations for Outpatient Follow-up:   Follow-up Information    Follow up with [name], MD.   Specialty:  Family Medicine   Why:  As needed   Contact information:   [place] [address] [phone]       Follow up with Jamesetta So, MD.   Specialty:  General Surgery   Why:  office will contact you with appointment   Contact information:   [address] [place] [facility] [id number] [phone]       Follow up with Jamesetta So, MD. Schedule an appointment as soon as possible for a visit on 06/23/2015.   Specialty:  General Surgery   Contact information:   [address] [place] [id number] [phone]       Discharge Diagnoses:  1. Small bowel obstruction 2. Acute hypoxic respiratory failure 3. CLL 4. OSA  Discharge Condition: Improved Disposition: Home  Diet recommendation: Regular  Filed Weights   06/05/15 1241 06/05/15 1655 06/09/15 0500  Weight: 82.101 kg (181 lb) 82.555 kg (182 lb) 83.1 kg (183 lb 3.2 oz)    History of present illness:  80 yof with a hx of CLL and obstructive sleep apnea presented with RLQ abdominal pain and vomiting. While in the ED, CT of the abdomen revealed a small bowel obstruction with transittion point in the right lower abdomen.   Hospital Course:  Patient presented with abdominal pain and vomiting, found to have a small bowel obstruction with transition point in the right lower abdomen. General surgery was consulted and placed an NG tube with initial improvement however patient began having recurrent episodes of vomiting. It was decided to proceed with an ExLap which was performed on 1/16 with lysis of adhesions by Dr. Arnoldo Morale. Following the surgery, patient required intubation due to respiratory depression.  She was weaned that same evening and extubated without any difficulties. Her diet has been advanced slowly and she is now tolerating a solid diet without any pain, nausea, or vomiting.    Individual issues as below:  1. SBO, doing well status post surgery, resolved. Multiple bowel movements. Tolerating diet. 2. Acute hypoxic respiratory failure, likely related to atelectasis secondary to abdominal pain and shallow inspiration. Resolved. 3. CLL, leukocytosis at baseline.  4. Obstructive sleep apnea, continue CPAP QHS.  Consultants:  General Surgery  PT- no follow up  Procedures:  Ex Lap 1/16, lysis of adhesions  Antibiotics:  None  Discharge Instructions    Discharge Instructions    Activity as tolerated - No restrictions    Complete by:  As directed      Diet general    Complete by:  As directed      Discharge instructions    Complete by:  As directed   Call your physician or seek immediate medical attention for pain, fever, vomiting or worsening of condition.     Discharge wound care:    Complete by:  As directed   As directed by Dr. Arnoldo Morale.           Discharge Medication List as of 06/12/2015 11:32 AM    CONTINUE these medications which have NOT CHANGED   Details  albuterol (PROVENTIL HFA;VENTOLIN HFA) 108 (90 BASE) MCG/ACT inhaler Inhale 2 puffs into the lungs every 6 (six) hours as needed for shortness of breath. , Until Discontinued, Historical Med  albuterol (PROVENTIL) (2.5 MG/3ML) 0.083% nebulizer solution Take 2.5 mg by nebulization every 4 (four) hours as needed for wheezing or shortness of breath., Until Discontinued, Historical Med    aspirin 81 MG tablet Take 81 mg by mouth at bedtime. , Until Discontinued, Historical Med    cholecalciferol (VITAMIN D) 1000 UNITS tablet Take 1,000 Units by mouth daily., Until Discontinued, Historical Med    gabapentin (NEURONTIN) 100 MG capsule Take 100-200 mg by mouth 2 (two) times daily. Take one capsule in AM, 2  capsules at bedtime, Starting 12/17/2014, Until Discontinued, Historical Med    megestrol (MEGACE) 40 MG tablet TAKE ONE TABLET BY MOUTH DAILY AT BEDTIME., Normal    omeprazole (PRILOSEC) 20 MG capsule Take 20 mg by mouth at bedtime. , Until Discontinued, Historical Med       Allergies  Allergen Reactions  . Codeine Nausea And Vomiting    headache  . Meclizine Other (See Comments)    Made dizziness worse.    The results of significant diagnostics from this hospitalization (including imaging, microbiology, ancillary and laboratory) are listed below for reference.    Significant Diagnostic Studies: Dg Chest 1 View  06/08/2015  CLINICAL DATA:  80 year old female with right breast cancer and chronic lymphocytic leukemia. Enteric tube placement. EXAM: CHEST 1 VIEW COMPARISON:  Radiograph dated 06/06/2015 FINDINGS: An enteric tube is noted with tip beyond the image cut off. The lungs are clear. Stable cardiomegaly. The osseous structures appear unremarkable. IMPRESSION: Partially visualized enteric tube with tip beyond the image cut off. Electronically Signed   By: Anner Crete M.D.   On: 06/08/2015 02:20   Ct Abdomen Pelvis W Contrast  06/05/2015  CLINICAL DATA:  Right lower quadrant pain starting today this morning, vomiting, history of leukemia, status post mastectomy for breast cancer EXAM: CT ABDOMEN AND PELVIS WITH CONTRAST TECHNIQUE: Multidetector CT imaging of the abdomen and pelvis was performed using the standard protocol following bolus administration of intravenous contrast. CONTRAST:  156mL OMNIPAQUE IOHEXOL 300 MG/ML  SOLN COMPARISON:  Report CT scan 07/19/2002 no images available, lumbar spine x-ray 10/01/2013 FINDINGS: Sagittal images of the spine shows diffuse osteopenia. Mild disc space flattening with anterior spurring at L2-L3 level. Stable mild compression deformity upper endplate of 624THL vertebral body. Mild fatty infiltration of the liver. No solid hepatic mass is noted.  Atherosclerotic calcifications of mitral valve. Visualized lung bases are unremarkable. In mild intrahepatic biliary ductal dilatation probable postcholecystectomy. There is a cyst in right hepatic lobe posterior medially measures 6.5 mm. No aortic aneurysm. Enhanced pancreas, spleen and adrenal glands are unremarkable. Enhanced kidneys are symmetrical in size. No hydronephrosis or hydroureter. On there is a coarse calcified calculus in lower pole of the right kidney extending in right collecting system. Measures 2.1 cm x 1.1 cm. There is no evidence of obstruction. Mild dilatation of the right extrarenal pelvis. There is probable cortical scarring in lower pole of the right kidney. No hydronephrosis or hydroureter. No aortic aneurysm. There is no pericecal inflammation. Some liquid stool noted within cecum. The appendix is surgically absent. There are distended small bowel loops with fluid and some air-fluid levels. In axial image 42 there is mild segmental dilatation of small bowel in right abdomen with fecal like material. This segment is best visualized in coronal image 52 measures at least 5.6 cm in length. There is mild adjacent fluid in small bowel mesentery and probable mild mesenteric edema at this level. Beyond this point the small bowel is smaller caliber as  seen in coronal image 45. Findings are highly suspicious for small bowel obstruction. Distal small bowel loops are small caliber. The terminal ileum is decompressed small caliber. The transverse colon descending colon and proximal sigmoid colon is empty decompressed. Multiple sigmoid colon diverticula are noted. No evidence of acute diverticulitis. The patient is status post hysterectomy. Evaluation of the pelvis is limited by metallic artifacts from right hip prosthesis. The patient is status post right mastectomy. IMPRESSION: 1. There are dilated small bowel loops with multiple air-fluid levels. There is short segment distended small bowel  containing fecal like material in right abdomen with small amount of adjacent mesenteric fluid and edema please see axial image 42. There is transition point of the caliber of small bowel at this level. Findings are highly suspicious for small bowel obstruction. 2. No pericecal inflammation.  Status post appendectomy. 3. Fatty infiltration of the liver. Status postcholecystectomy. A cyst is noted in right hepatic lobe measures 6.5 mm. 4. There is probable cortical scarring in lower pole of the right kidney. Large calcified nonobstructive calculus is noted in lower pole of the right kidney measures at least 2.1 x 1.1 cm. No hydronephrosis or hydroureter. 5. Decompressed majority of the colon. Sigmoid colon diverticula are noted. No evidence of acute diverticulitis. 6. Status post hysterectomy. 7. Limited evaluation of the pelvis due to metallic artifacts from right hip prosthesis. These results were called by telephone at the time of interpretation on 06/05/2015 at 2:57 pm to Dr. Noemi Chapel , who verbally acknowledged these results. Electronically Signed   By: Lahoma Crocker M.D.   On: 06/05/2015 14:57   Dg Chest Portable 1 View  06/08/2015  CLINICAL DATA:  Difficulty breathing postoperatively, personal history of breast cancer stage III and chronic lymphocytic leukemia EXAM: PORTABLE CHEST 1 VIEW COMPARISON:  06/08/2015 FINDINGS: Enlarged cardiac silhouette. Orogastric tube extends into the stomach. Endotracheal tube identified with tip about 3 cm above the carina. Retrocardiac left lower lobe opacity consistent with consolidation. Right lung is clear. IMPRESSION: Lines and tubes as described. Left lower lobe consolidation could represent atelectasis or pneumonitis. Electronically Signed   By: Skipper Cliche M.D.   On: 06/08/2015 16:14   Dg Chest Port 1 View  06/06/2015  CLINICAL DATA:  Hypertension, vomiting and small bowel obstruction EXAM: PORTABLE CHEST 1 VIEW COMPARISON:  04/10/2014 FINDINGS: NG tube  extends into stomach. Normal cardiac silhouette. No effusion, infiltrate or pneumothorax. IMPRESSION: NG tube with tip in the gastric body. Electronically Signed   By: Suzy Bouchard M.D.   On: 06/06/2015 13:55    Microbiology: Recent Results (from the past 240 hour(s))  Surgical pcr screen     Status: None   Collection Time: 06/08/15 10:48 AM  Result Value Ref Range Status   MRSA, PCR NEGATIVE NEGATIVE Final   Staphylococcus aureus NEGATIVE NEGATIVE Final    Comment:        The Xpert SA Assay (FDA approved for NASAL specimens in patients over 4 years of age), is one component of a comprehensive surveillance program.  Test performance has been validated by [facility] for patients greater than or equal to 7 year old. It is not intended to diagnose infection nor to guide or monitor treatment.      Labs: Basic Metabolic Panel:  Recent Labs Lab 06/05/15 1309 06/06/15 0556 06/07/15 0629 06/09/15 0405 06/10/15 0625  NA 143 144 142 142 142  K 3.8 4.0 4.3 4.2 3.6  CL 109 113* 108 106 107  CO2 22  21* 26 25 27   GLUCOSE 134* 103* 128* 119* 114*  BUN 17 15 14 17 12   CREATININE 0.84 0.75 0.80 0.84 0.77  CALCIUM 9.6 8.6* 8.7* 8.4* 8.2*  MG  --   --  2.0  --  2.1  PHOS  --   --  2.7  --  2.7   Liver Function Tests:  Recent Labs Lab 06/05/15 1309 06/06/15 0556  AST 21 19  ALT 17 15  ALKPHOS 60 47  BILITOT 0.8 0.7  PROT 7.2 6.3*  ALBUMIN 4.4 3.7    Recent Labs Lab 06/05/15 1309  LIPASE 25    CBC:  Recent Labs Lab 06/06/15 0556 06/07/15 0629 06/09/15 0405 06/10/15 0625  WBC 59.8* 48.2* 58.3* 43.6*  HGB 13.1 12.5 14.6 12.4  HCT 39.9 38.7 45.1 38.2  MCV 91.9 92.1 91.3 91.8  PLT 218 197 229 191   Cardiac Enzymes:  Recent Labs Lab 06/08/15 1701 06/08/15 2138 06/09/15 0405  TROPONINI <0.03 0.03 <0.03     CBG:  Recent Labs Lab 06/06/15 0802 06/07/15 0806 06/08/15 0746 06/09/15 0745 06/10/15 0750  GLUCAP 113* 116* 103* 110* 103*     Principal Problem:   SBO (small bowel obstruction) (HCC) Active Problems:   OSA (obstructive sleep apnea)   CLL (chronic lymphocytic leukemia) ([place])   Hyperglycemia   Time coordinating discharge: 35 minutes  Signed:  Murray Hodgkins, MD [facility] 06/10/2015, 10:14 AM    By signing my name below, I, Rosalie Doctor attest that this documentation has been prepared under the direction and in the presence of Murray Hodgkins, MD Electronically signed: Rosalie Doctor, Scribe. 06/10/2015  I personally performed the services described in this documentation. All medical record entries made by the scribe were at my direction. I have reviewed the chart and agree that the record reflects my personal performance and is accurate and complete. Murray Hodgkins, MD

## 2015-06-10 NOTE — Progress Notes (Signed)
PROGRESS NOTE  [name] Cliett [id number] DOB: 27-Aug-1935 DOA: 06/05/2015 PCP: [name], MD  Summary: 30 yof with a hx of CLL and obstructive sleep apnea presented with RLQ abdominal pain and vomiting. While in the ED, CT of the abdomen revealed a small bowel obstruction with transition point in the right lower abdomen. General surgery consulted and attempted conservative therapy however patient continued with multiple episodes of emesis. Dr. Arnoldo Morale performed an exploratory laparotomy on 1/16 with lysis of adhesions.   Assessment/Plan: 1. SBO, s/p Ex Lap on 1/16 with lysis of adhesions. Tolerating liquids well.  2. Acute hypoxic respiratory failure, currently requiring 2L [place], likely related to atelectasis secondary to abdominal pain and shallow inspiration. 3. CLL, leukocytosis at baseline.  4. Obstructive sleep apnea, continue CPAP QHS.    Overall improving. Continue to advance diet as tolerated.   Wean O2 as tolerated and d/c telemetry today. Remove foley catheter.  Awaiting return of bowel function, hopefully discharge next 2-3 days  Code Status: Full DVT prophylaxis: Lovenox Family Communication: No family at bedside. Disposition Plan: Anticipate discharge in 1-2 days.   Murray Hodgkins, MD  [facility]  Pager [phone] If 7PM-7AM, please contact night-coverage at [email or web address], password [facility] 06/10/2015, 7:25 AM  LOS: 4 days   Consultants:  General Surgery  Procedures:  Ex Lap 1/16, lysis of adhesions  Antibiotics:  none  HPI/Subjective: Has some nausea but denies vomiting. Has not had a BM or passed flatus since her surgery. Only has abdominal pain when she coughs.   Objective: Filed Vitals:   06/09/15 1700 06/09/15 1800 06/09/15 2055 06/10/15 0508  BP: 109/71 108/70 124/67 120/87  Pulse: 79 78 69 66  Temp:   100 F (37.8 C) 98.8 F (37.1 C)  TempSrc:   Oral Oral  Resp: 18 30 20 20   Height:      Weight:      SpO2: 98% 98% 89% 95%     Intake/Output Summary (Last 24 hours) at 06/10/15 0725 Last data filed at 06/10/15 0508  Gross per 24 hour  Intake    780 ml  Output   1600 ml  Net   -820 ml     Filed Weights   06/05/15 1241 06/05/15 1655 06/09/15 0500  Weight: 82.101 kg (181 lb) 82.555 kg (182 lb) 83.1 kg (183 lb 3.2 oz)    Exam:     VSS, afebrile, not hypoxic General:  Appears calm and comfortable Eyes: PERRL, normal lids, irises & conjunctiva ENT: grossly normal hearing, lips & tongue Cardiovascular: RRR, no m/r/g. No LE edema. Telemetry: SR, no arrhythmias  Respiratory: CTA bilaterally, no w/r/r. Normal respiratory effort. Abdomen: soft, generalized TTP, more so on right side at incision. Incision unremarkable. Normal bowel sounds Psychiatric: grossly normal mood and affect, speech fluent and appropriate Neurologic: grossly non-focal.  New data reviewed:  WBC 43.6- chronic, CBC otherwise unremarkable.   BMP unremarkable.  Pertinent data since admission:  WBC 70.7   Scheduled Meds: . antiseptic oral rinse  7 mL Mouth Rinse QID  . chlorhexidine gluconate  15 mL Mouth Rinse BID  . enoxaparin (LOVENOX) injection  40 mg Subcutaneous Q24H  . pantoprazole (PROTONIX) IV  40 mg Intravenous Q24H   Continuous Infusions: . dextrose 5 % and 0.45 % NaCl with KCl 20 mEq/L 75 mL/hr at 06/10/15 0549    Principal Problem:   SBO (small bowel obstruction) (HCC) Active Problems:   OSA (obstructive sleep apnea)   CLL (chronic lymphocytic leukemia) ([place])  Hyperglycemia   Time spent 20 minutes   By signing my name below, I, Rosalie Doctor attest that this documentation has been prepared under the direction and in the presence of Murray Hodgkins, MD Electronically signed: Rosalie Doctor, Scribe. 06/10/2015 10:31am  I personally performed the services described in this documentation. All medical record entries made by the scribe were at my direction. I have reviewed the chart and agree that the  record reflects my personal performance and is accurate and complete. Murray Hodgkins, MD

## 2015-06-10 NOTE — Progress Notes (Signed)
Talked with pt about wearing CPAP for tonight pt stated" that she refuse to wear CPAP at home and will not wear one here. She doesn't like anything on her face and that has been 6 years ago". Her spo2 was 89% on room air and will be placed on 2lpm cann after breathing treatment.

## 2015-06-10 NOTE — Progress Notes (Signed)
Central telemetry notified of order to discontinue cardiac monitor, telemetry removed.  IV to saline lock per orders.  Foley catheter removed per orders, tip intact.  Will monitor urine outpuut.

## 2015-06-10 NOTE — Progress Notes (Signed)
2 Days Post-Op  Subjective: Patient resting comfortably. Easily arousable. States her breathing is back to her baseline state. Has not had a bowel movement or flatus yet.  Objective: Vital signs in last 24 hours: Temp:  [98.8 F (37.1 C)-100 F (37.8 C)] 98.8 F (37.1 C) (01/18 0508) Pulse Rate:  [66-79] 66 (01/18 0508) Resp:  [18-30] 20 (01/18 0508) BP: (108-124)/(67-87) 120/87 mmHg (01/18 0508) SpO2:  [89 %-98 %] 95 % (01/18 0508) Last BM Date: 06/05/15  Intake/Output from previous day: 01/17 0701 - 01/18 0700 In: 780 [P.O.:480; I.V.:300] Out: 1600 [Urine:1600] Intake/Output this shift: Total I/O In: -  Out: 2500 [Urine:2500]  General appearance: alert, cooperative and no distress GI: Soft, incision healing well. Nondistended. Minimal bowel sounds appreciated.  Lab Results:   Recent Labs  06/09/15 0405 06/10/15 0625  WBC 58.3* 43.6*  HGB 14.6 12.4  HCT 45.1 38.2  PLT 229 191   BMET  Recent Labs  06/09/15 0405 06/10/15 0625  NA 142 142  K 4.2 3.6  CL 106 107  CO2 25 27  GLUCOSE 119* 114*  BUN 17 12  CREATININE 0.84 0.77  CALCIUM 8.4* 8.2*   PT/INR No results for input(s): LABPROT, INR in the last 72 hours.  Studies/Results: Dg Chest Portable 1 View  06/08/2015  CLINICAL DATA:  Difficulty breathing postoperatively, personal history of breast cancer stage III and chronic lymphocytic leukemia EXAM: PORTABLE CHEST 1 VIEW COMPARISON:  06/08/2015 FINDINGS: Enlarged cardiac silhouette. Orogastric tube extends into the stomach. Endotracheal tube identified with tip about 3 cm above the carina. Retrocardiac left lower lobe opacity consistent with consolidation. Right lung is clear. IMPRESSION: Lines and tubes as described. Left lower lobe consolidation could represent atelectasis or pneumonitis. Electronically Signed   By: Skipper Cliche M.D.   On: 06/08/2015 16:14    Anti-infectives: Anti-infectives    Start     Dose/Rate Route Frequency Ordered Stop   06/08/15 0800  ciprofloxacin (CIPRO) IVPB 400 mg     400 mg 200 mL/hr over 60 Minutes Intravenous On call to O.R. 06/08/15 0750 06/08/15 1435      Assessment/Plan: s/p Procedure(s): EXPLORATORY LAPAROTOMY LYSIS OF ADHESION Impression: Stable on postoperative day 2, status post exploratory laparotomy, lysis of adhesions. Awaiting return of bowel function. Respiratory status appears stable. Will continue current medical therapy.  LOS: 4 days    Aras Albarran A 06/10/2015

## 2015-06-11 LAB — BASIC METABOLIC PANEL
Anion gap: 8 (ref 5–15)
BUN: 13 mg/dL (ref 6–20)
CALCIUM: 8.2 mg/dL — AB (ref 8.9–10.3)
CO2: 28 mmol/L (ref 22–32)
CREATININE: 0.7 mg/dL (ref 0.44–1.00)
Chloride: 106 mmol/L (ref 101–111)
Glucose, Bld: 98 mg/dL (ref 65–99)
Potassium: 3.5 mmol/L (ref 3.5–5.1)
SODIUM: 142 mmol/L (ref 135–145)

## 2015-06-11 LAB — GLUCOSE, CAPILLARY
GLUCOSE-CAPILLARY: 111 mg/dL — AB (ref 65–99)
Glucose-Capillary: 43 mg/dL — CL (ref 65–99)

## 2015-06-11 MED ORDER — PANTOPRAZOLE SODIUM 40 MG PO TBEC
40.0000 mg | DELAYED_RELEASE_TABLET | Freq: Every day | ORAL | Status: DC
  Administered 2015-06-11: 40 mg via ORAL
  Filled 2015-06-11: qty 1

## 2015-06-11 NOTE — Progress Notes (Signed)
3 Days Post-Op  Subjective: Had multiple bowel movements over the past 24 hours. No abdominal pain noted.  Objective: Vital signs in last 24 hours: Temp:  [97.9 F (36.6 C)-99.9 F (37.7 C)] 97.9 F (36.6 C) (01/19 0431) Pulse Rate:  [66-78] 78 (01/19 0431) Resp:  [18-20] 20 (01/19 0431) BP: (122-137)/(72-74) 122/72 mmHg (01/19 0431) SpO2:  [89 %-99 %] 94 % (01/19 0831) Last BM Date: 06/10/15  Intake/Output from previous day: 01/18 0701 - 01/19 0700 In: -  Out: 2650 [Urine:2650] Intake/Output this shift:    General appearance: alert, cooperative and no distress GI: Soft, incision healing well. Bowel sounds active. Nondistended.  Lab Results:   Recent Labs  06/09/15 0405 06/10/15 0625  WBC 58.3* 43.6*  HGB 14.6 12.4  HCT 45.1 38.2  PLT 229 191   BMET  Recent Labs  06/10/15 0625 06/11/15 0615  NA 142 142  K 3.6 3.5  CL 107 106  CO2 27 28  GLUCOSE 114* 98  BUN 12 13  CREATININE 0.77 0.70  CALCIUM 8.2* 8.2*   PT/INR No results for input(s): LABPROT, INR in the last 72 hours.  Studies/Results: No results found.  Anti-infectives: Anti-infectives    Start     Dose/Rate Route Frequency Ordered Stop   06/08/15 0800  ciprofloxacin (CIPRO) IVPB 400 mg     400 mg 200 mL/hr over 60 Minutes Intravenous On call to O.R. 06/08/15 0750 06/08/15 1435      Assessment/Plan: s/p Procedure(s): EXPLORATORY LAPAROTOMY LYSIS OF ADHESION Impression: Bowel function has returned. Will advance diet. Will get physical therapy to assess ambulation. Anticipate discharge in next 24-48 hours.  LOS: 5 days    Brytney Somes A 06/11/2015

## 2015-06-11 NOTE — Progress Notes (Signed)
Patient has CPAP ordered but states she will not wear a CPAP. RT will continue to monitor.

## 2015-06-11 NOTE — Anesthesia Postprocedure Evaluation (Signed)
Anesthesia Post Note  Patient: [name]  Procedure(s) Performed: Procedure(s) (LRB): EXPLORATORY LAPAROTOMY (N/A) LYSIS OF ADHESION (N/A)  Patient location during evaluation: Nursing Unit Anesthesia Type: General Level of consciousness: awake and alert Pain management: pain level controlled Vital Signs Assessment: post-procedure vital signs reviewed and stable Respiratory status: spontaneous breathing Cardiovascular status: blood pressure returned to baseline Anesthetic complications: no    Last Vitals:  Filed Vitals:   06/10/15 2042 06/11/15 0431  BP: 131/73 122/72  Pulse: 78 78  Temp: 37.7 C 36.6 C  Resp: 20 20    Last Pain:  Filed Vitals:   06/11/15 1021  PainSc: 0-No pain                 Makaia Rappa

## 2015-06-11 NOTE — Addendum Note (Signed)
Addendum  created 06/11/15 1032 by Vista Deck, CRNA   Modules edited: Notes Section   Notes Section:  File: [id number]

## 2015-06-11 NOTE — Evaluation (Signed)
Physical Therapy Evaluation Patient Details Name: [name] MRN: [id number] DOB: [birth year] Today's Date: 06/11/2015   History of Present Illness  80yo white female who comes to [facility] after sudden onset of vomitting and RLQ ABD pain. CT showing obstruction in GI tract, admitted for SOB. PMH: R mastectomy (1999), R THA (direct lateral). Pt is resting comfortably upon entry adn motivated to get up and be active.   Clinical Impression  Pt is pain free throughout session, with vitals within normal safe limits. Pt is modified independent or better with all functional mobility, and is very near baseline. Pt does nto believe that she will need any PT after DC as she is very active and does not like to sit still for too long, but values her independence and ability to remain an active community dweller. Pt is safe for DC home once cleared medically. Recommending DC to home, with no additional PT services needed. PT signing off.     Follow Up Recommendations No PT follow up    Equipment Recommendations  None recommended by PT    Recommendations for Other Services       Precautions / Restrictions Restrictions Weight Bearing Restrictions: No      Mobility  Bed Mobility Overal bed mobility: Modified Independent             General bed mobility comments: diffculty due to postoperative abdominal weakness and pain inhibition.   Transfers Overall transfer level: Independent                  Ambulation/Gait Ambulation/Gait assistance: Modified independent (Device/Increase time) Ambulation Distance (Feet): 225 Feet Assistive device: None Gait Pattern/deviations: WFL(Within Functional Limits)     General Gait Details: slow and cautious   Stairs            Wheelchair Mobility    Modified Rankin (Stroke Patients Only)       Balance Overall balance assessment: No apparent balance deficits (not formally assessed)                                            Pertinent Vitals/Pain Pain Assessment: No/denies pain Pain Score: 0-No pain    Home Living Family/patient expects to be discharged to:: Private residence Living Arrangements: Alone Available Help at Discharge: Family (sister) Type of Home:  (HUD handicapped accessible apartment for seniors. ) Home Access: Level entry     Home Layout: One level Home Equipment: Environmental consultant - 2 wheels;Cane - single point      Prior Function Level of Independence: Independent               Hand Dominance   Dominant Hand: Right    Extremity/Trunk Assessment   Upper Extremity Assessment: Overall WFL for tasks assessed           Lower Extremity Assessment: Overall WFL for tasks assessed      Cervical / Trunk Assessment: Normal  Communication   Communication: No difficulties  Cognition Arousal/Alertness: Awake/alert Behavior During Therapy: WFL for tasks assessed/performed Overall Cognitive Status: Within Functional Limits for tasks assessed                      General Comments      Exercises        Assessment/Plan    PT Assessment Patent does not need any further PT services  PT  Diagnosis Difficulty walking   PT Problem List    PT Treatment Interventions     PT Goals (Current goals can be found in the Care Plan section) Acute Rehab PT Goals PT Goal Formulation: All assessment and education complete, DC therapy    Frequency     Barriers to discharge        Co-evaluation               End of Session Equipment Utilized During Treatment: Gait belt Activity Tolerance: Patient tolerated treatment well;No increased pain Patient left: in bed           Time: 1141-1155 PT Time Calculation (min) (ACUTE ONLY): 14 min   Charges:   PT Evaluation $PT Eval Low Complexity: 1 Procedure PT Treatments $Therapeutic Activity: 8-22 mins   PT G Codes:        12:14 PM, June 17, 2015 Etta Grandchild, PT, DPT PRN Physical Therapist at [place] License # [id number] (wireless)  [phone] (mobile)

## 2015-06-11 NOTE — Progress Notes (Signed)
Found patient in room with oxygen back on at 2L, stated respiratory came in earlier and placed back on 2L, now complaining of dull headache, offered Tylernol, given.

## 2015-06-11 NOTE — Progress Notes (Signed)
PROGRESS NOTE  [name] [id number] DOB: Apr 29, 1936 DOA: 06/05/2015 PCP: [name], MD  Summary: 12 yof with a hx of CLL and obstructive sleep apnea presented with RLQ abdominal pain and vomiting. While in the ED, CT of the abdomen revealed a small bowel obstruction with transition point in the right lower abdomen. General surgery consulted and attempted conservative therapy however patient continued with multiple episodes of emesis. Dr. Arnoldo Morale performed an exploratory laparotomy on 1/16 with lysis of adhesions.   Assessment/Plan: 1. SBO, doing well status post surgery. Multiple bowel movements. Tolerating diet. 2. Acute hypoxic respiratory failure,  likely related to atelectasis secondary to abdominal pain and shallow inspiration.  3. CLL, leukocytosis at baseline.  4. Obstructive sleep apnea, continue CPAP QHS.    Continues to improve. Likely discharge next 24 hours.  Code Status: Full DVT prophylaxis: Lovenox Family Communication: Sister at bedside.  Disposition Plan: Anticipate discharge tomorrow.  Murray Hodgkins, MD  [facility]  Pager [phone] If 7PM-7AM, please contact night-coverage at [email or web address], password [facility] 06/11/2015, 7:14 AM  LOS: 5 days   Consultants:  General Surgery  PT- no follow up  Procedures:  Ex Lap 1/16, lysis of adhesions  Antibiotics:  none  HPI/Subjective: Feels good. Ambulated 263ft today with PT without difficulty. Has been having multiple BMs.  Objective: Filed Vitals:   06/10/15 1459 06/10/15 2011 06/10/15 2042 06/11/15 0431  BP: 137/74  131/73 122/72  Pulse: 66  78 78  Temp: 99.1 F (37.3 C)  99.9 F (37.7 C) 97.9 F (36.6 C)  TempSrc: Oral  Oral Oral  Resp: 18  20 20   Height:      Weight:      SpO2: 94% 89% 97% 99%    Intake/Output Summary (Last 24 hours) at 06/11/15 0714 Last data filed at 06/11/15 0551  Gross per 24 hour  Intake      0 ml  Output   2650 ml  Net  -2650 ml     Filed  Weights   06/05/15 1241 06/05/15 1655 06/09/15 0500  Weight: 82.101 kg (181 lb) 82.555 kg (182 lb) 83.1 kg (183 lb 3.2 oz)    Exam:     VSS, afebrile, not hypoxic General:  Appears calm and comfortable Cardiovascular: RRR, no m/r/g. No LE edema. Respiratory: CTA bilaterally, no w/r/r. Normal respiratory effort. Abdomen: soft, ntnd Psychiatric: grossly normal mood and affect, speech fluent and appropriate  New data reviewed:  BMP unremarkable.  Pertinent data since admission:  WBC 70.7   Scheduled Meds: . albuterol  2.5 mg Nebulization TID  . antiseptic oral rinse  7 mL Mouth Rinse QID  . chlorhexidine gluconate  15 mL Mouth Rinse BID  . enoxaparin (LOVENOX) injection  40 mg Subcutaneous Q24H  . magnesium hydroxide  30 mL Oral BID  . pantoprazole (PROTONIX) IV  40 mg Intravenous Q24H   Continuous Infusions:    Principal Problem:   SBO (small bowel obstruction) (HCC) Active Problems:   OSA (obstructive sleep apnea)   CLL (chronic lymphocytic leukemia) ([place])   Hyperglycemia   Time spent 20 minutes   By signing my name below, I, Rosalie Doctor attest that this documentation has been prepared under the direction and in the presence of Murray Hodgkins, MD Electronically signed: Rosalie Doctor, Scribe. 06/11/2015 1:14pm   I personally performed the services described in this documentation. All medical record entries made by the scribe were at my direction. I have reviewed the chart and agree that the record reflects my  personal performance and is accurate and complete. Murray Hodgkins, MD

## 2015-06-12 NOTE — Progress Notes (Signed)
  PROGRESS NOTE  [name] [id number] DOB: 1935/09/25 DOA: 06/05/2015 PCP: [name], MD  Summary: 18 yof with a hx of CLL and obstructive sleep apnea presented with RLQ abdominal pain and vomiting. While in the ED, CT of the abdomen revealed a small bowel obstruction with transition point in the right lower abdomen. General surgery consulted and attempted conservative therapy however patient continued with multiple episodes of emesis. Dr. Arnoldo Morale performed an exploratory laparotomy on 1/16 with lysis of adhesions.   Assessment/Plan: 1. SBO, doing well status post surgery, resolved. Multiple bowel movements. Tolerating diet. 2. Acute hypoxic respiratory failure,  likely related to atelectasis secondary to abdominal pain and shallow inspiration. Resolved. 3. CLL, leukocytosis at baseline.  4. Obstructive sleep apnea, continue CPAP QHS.    Much improved. Discharge today as per surgery.  Code Status: Full DVT prophylaxis: Lovenox Family Communication: No family at bedside.  Disposition Plan: Discharge today.  Murray Hodgkins, MD  [facility]  Pager [phone] If 7PM-7AM, please contact night-coverage at [email or web address], password [facility] 06/12/2015, 6:48 AM  LOS: 6 days   Consultants:  General Surgery  PT- no follow up  Procedures:  Ex Lap 1/16, lysis of adhesions  Antibiotics:  none  HPI/Subjective: Feels great. Excited to go home today.   Objective: Filed Vitals:   06/11/15 2023 06/11/15 2027 06/11/15 2038 06/12/15 0457  BP:   115/56 133/77  Pulse:   75 74  Temp:   98.3 F (36.8 C) 98.2 F (36.8 C)  TempSrc:   Oral Oral  Resp:   18 20  Height:      Weight:      SpO2: 96% 96% 96% 95%    Intake/Output Summary (Last 24 hours) at 06/12/15 0648 Last data filed at 06/11/15 1853  Gross per 24 hour  Intake    720 ml  Output    300 ml  Net    420 ml     Filed Weights   06/05/15 1241 06/05/15 1655 06/09/15 0500  Weight: 82.101 kg (181 lb)  82.555 kg (182 lb) 83.1 kg (183 lb 3.2 oz)    Exam:     VSS, afebrile, not hypoxic General:  Appears comfortable, calm. Cardiovascular: Regular rate and rhythm, no murmur, rub or gallop. No lower extremity edema. Respiratory: Clear to auscultation bilaterally, no wheezes, rales or rhonchi. Normal respiratory effort. Abdomen: soft, ntnd Psychiatric: grossly normal mood and affect, speech fluent and appropriate  New data reviewed:  No new data   Scheduled Meds: . albuterol  2.5 mg Nebulization TID  . enoxaparin (LOVENOX) injection  40 mg Subcutaneous Q24H  . magnesium hydroxide  30 mL Oral BID  . pantoprazole  40 mg Oral Q1200   Continuous Infusions:    Principal Problem:   SBO (small bowel obstruction) (HCC) Active Problems:   OSA (obstructive sleep apnea)   CLL (chronic lymphocytic leukemia) ([place])   Hyperglycemia    By signing my name below, I, Rosalie Doctor attest that this documentation has been prepared under the direction and in the presence of Murray Hodgkins, MD Electronically signed: Rosalie Doctor, Scribe. 06/12/2015 10:55am  I personally performed the services described in this documentation. All medical record entries made by the scribe were at my direction. I have reviewed the chart and agree that the record reflects my personal performance and is accurate and complete. Murray Hodgkins, MD

## 2015-06-12 NOTE — Progress Notes (Signed)
4 Days Post-Op  Subjective: Feels fine. Tolerating regular diet well. Still having bowel movements.  Objective: Vital signs in last 24 hours: Temp:  [98.2 F (36.8 C)-98.3 F (36.8 C)] 98.2 F (36.8 C) (01/20 0457) Pulse Rate:  [74-95] 95 (01/20 0905) Resp:  [18-20] 18 (01/20 0905) BP: (115-133)/(56-77) 133/77 mmHg (01/20 0457) SpO2:  [86 %-97 %] 97 % (01/20 1013) Last BM Date: 06/11/15  Intake/Output from previous day: 01/19 0701 - 01/20 0700 In: 720 [P.O.:720] Out: 300 [Urine:300] Intake/Output this shift:    General appearance: alert, cooperative and no distress GI: Soft, incision healing well. Active bowel sounds appreciated.  Lab Results:   Recent Labs  06/10/15 0625  WBC 43.6*  HGB 12.4  HCT 38.2  PLT 191   BMET  Recent Labs  06/10/15 0625 06/11/15 0615  NA 142 142  K 3.6 3.5  CL 107 106  CO2 27 28  GLUCOSE 114* 98  BUN 12 13  CREATININE 0.77 0.70  CALCIUM 8.2* 8.2*   PT/INR No results for input(s): LABPROT, INR in the last 72 hours.  Studies/Results: No results found.  Anti-infectives: Anti-infectives    Start     Dose/Rate Route Frequency Ordered Stop   06/08/15 0800  ciprofloxacin (CIPRO) IVPB 400 mg     400 mg 200 mL/hr over 60 Minutes Intravenous On call to O.R. 06/08/15 0750 06/08/15 1435      Assessment/Plan: s/p Procedure(s): EXPLORATORY LAPAROTOMY LYSIS OF ADHESION Impression: Doing well from surgery standpoint. Okay for discharge. Will see patient in my office in follow-up. Instructions given to patient.  LOS: 6 days    Selin Eisler A 06/12/2015

## 2015-06-12 NOTE — Progress Notes (Signed)
Patient has order for serum creatinine this morning. Patient has had one unsuccessful lab stick and does not want to be stuck again. Patient is planning on being discharged today. Contacted hospitalist who says it is okay to discontinue this morning's lab draw.

## 2015-06-12 NOTE — Care Management Note (Signed)
Case Management Note  Patient Details  Name: [name] MRN: [id number] Date of Birth: [birth year]  Subjective/Objective:       Discussed discharge planning with patient who is alert and oriented, No Home O2, Has Walker cane and BSC on one floor apartment.   Stated that her sister lives in same complex.      Denies issues with medications or transportation.   Action/Plan:   Home with self care. Expected Discharge Date:                  Expected Discharge Plan:  Home/Self Care  In-House Referral:     Discharge planning Services  CM Consult  Post Acute Care Choice:    Choice offered to:     DME Arranged:    DME Agency:     HH Arranged:    [place] Agency:     Status of Service:  Completed, signed off  Medicare Important Message Given:    Date Medicare IM Given:    Medicare IM give by:    Date Additional Medicare IM Given:    Additional Medicare Important Message give by:     If discussed at [place] of Stay Meetings, dates discussed:    Additional Comments:  Alvie Heidelberg, RN 06/12/2015, 12:39 PM

## 2015-06-12 NOTE — Care Management Important Message (Signed)
Important Message  Patient Details  Name: [name] MRN: [id number] Date of Birth: 1936/01/25   Medicare Important Message Given:  Yes    Alvie Heidelberg, RN 06/12/2015, 12:46 PM

## 2015-06-12 NOTE — Discharge Instructions (Signed)
Exploratory Laparotomy, Adult, Care After °Refer to this sheet in the next few weeks. These instructions provide you with information about caring for yourself after your procedure. Your health care provider may also give you more specific instructions. Your treatment has been planned according to current medical practices, but problems sometimes occur. Call your health care provider if you have any problems or questions after your procedure. °WHAT TO EXPECT AFTER THE PROCEDURE °After your procedure, it is typical to have: °· Abdominal soreness. °· Fatigue. °· A sore throat from tubes in your throat. °· A lack of appetite. °HOME CARE INSTRUCTIONS °Medicines °· Take medicines only as directed by your health care provider. °· Do not drive or operate heavy machinery while taking pain medicine. °Incision Care °· There are many different ways to close and cover an incision, including stitches (sutures), skin glue, and adhesive strips. Follow your health care provider's instructions about: °¨ Incision care. °¨ Bandage (dressing) changes and removal. °¨ Incision closure removal. °· Do not take showers or baths until your health care provider says that you can. °· Check your incision area daily for signs of infection. Watch for: °¨ Redness. °¨ Tenderness. °¨ Swelling. °¨ Drainage. °Activities °· Do not lift anything that is heavier than 10 pounds (4.5 kg) until your health care provider says that it is safe. °· Try to walk a little bit each day if your health care provider says that it is okay. °· Ask your health care provider when you can start to do your usual activities again, such as driving, going back to work, and having sex. °Eating and Drinking °· You may eat what you usually eat. Include lots of whole grains, fruits, and vegetables in your diet. This will help to prevent constipation. °· Drink enough fluid to keep your urine clear or pale yellow. °General Instructions °· Keep all follow-up visits as directed by  your health care provider. This is important. °SEEK MEDICAL CARE IF:  °· You have a fever. °· You have chills. °· Your pain medicine is not helping. °· You have constipation or diarrhea. °· You have nausea or vomiting. °· You have drainage, redness, swelling, or pain at your incision site. °SEEK IMMEDIATE MEDICAL CARE IF: °· Your pain is getting worse. °· It has been more than 3 days since you been able to have a bowel movement. °· You have ongoing (persistent) vomiting. °· The edges of your incision open up. °· You have warmth, tenderness, and swelling in your calf. °· You have trouble breathing. °· You have chest pain. °  °This information is not intended to replace advice given to you by your health care provider. Make sure you discuss any questions you have with your health care provider. °  °Document Released: 12/22/2003 Document Revised: 05/30/2014 Document Reviewed: 12/25/2013 °Elsevier Interactive Patient Education ©2016 [employer] ° °

## 2015-06-26 ENCOUNTER — Encounter (HOSPITAL_COMMUNITY): Payer: Self-pay | Admitting: Hematology & Oncology

## 2015-06-26 ENCOUNTER — Encounter (HOSPITAL_COMMUNITY): Payer: Medicare HMO

## 2015-06-26 ENCOUNTER — Encounter (HOSPITAL_COMMUNITY): Payer: Medicare HMO | Attending: Hematology & Oncology | Admitting: Hematology & Oncology

## 2015-06-26 ENCOUNTER — Telehealth (HOSPITAL_COMMUNITY): Payer: Self-pay | Admitting: *Deleted

## 2015-06-26 VITALS — BP 132/78 | HR 70 | Temp 97.7°F | Resp 18 | Wt 175.0 lb

## 2015-06-26 DIAGNOSIS — Z9011 Acquired absence of right breast and nipple: Secondary | ICD-10-CM | POA: Diagnosis not present

## 2015-06-26 DIAGNOSIS — C9110 Chronic lymphocytic leukemia of B-cell type not having achieved remission: Secondary | ICD-10-CM | POA: Insufficient documentation

## 2015-06-26 DIAGNOSIS — C50911 Malignant neoplasm of unspecified site of right female breast: Secondary | ICD-10-CM

## 2015-06-26 DIAGNOSIS — Z885 Allergy status to narcotic agent status: Secondary | ICD-10-CM | POA: Diagnosis not present

## 2015-06-26 DIAGNOSIS — G4733 Obstructive sleep apnea (adult) (pediatric): Secondary | ICD-10-CM | POA: Diagnosis not present

## 2015-06-26 DIAGNOSIS — F329 Major depressive disorder, single episode, unspecified: Secondary | ICD-10-CM | POA: Diagnosis not present

## 2015-06-26 DIAGNOSIS — Z9049 Acquired absence of other specified parts of digestive tract: Secondary | ICD-10-CM | POA: Insufficient documentation

## 2015-06-26 DIAGNOSIS — Z809 Family history of malignant neoplasm, unspecified: Secondary | ICD-10-CM | POA: Insufficient documentation

## 2015-06-26 DIAGNOSIS — Z9889 Other specified postprocedural states: Secondary | ICD-10-CM | POA: Insufficient documentation

## 2015-06-26 DIAGNOSIS — M1990 Unspecified osteoarthritis, unspecified site: Secondary | ICD-10-CM | POA: Insufficient documentation

## 2015-06-26 DIAGNOSIS — E785 Hyperlipidemia, unspecified: Secondary | ICD-10-CM | POA: Insufficient documentation

## 2015-06-26 DIAGNOSIS — Z96641 Presence of right artificial hip joint: Secondary | ICD-10-CM | POA: Diagnosis not present

## 2015-06-26 DIAGNOSIS — Z90710 Acquired absence of both cervix and uterus: Secondary | ICD-10-CM | POA: Insufficient documentation

## 2015-06-26 LAB — COMPREHENSIVE METABOLIC PANEL
ALK PHOS: 57 U/L (ref 38–126)
ALT: 20 U/L (ref 14–54)
ANION GAP: 10 (ref 5–15)
AST: 22 U/L (ref 15–41)
Albumin: 4.1 g/dL (ref 3.5–5.0)
BILIRUBIN TOTAL: 0.8 mg/dL (ref 0.3–1.2)
BUN: 17 mg/dL (ref 6–20)
CALCIUM: 9.2 mg/dL (ref 8.9–10.3)
CO2: 21 mmol/L — ABNORMAL LOW (ref 22–32)
Chloride: 111 mmol/L (ref 101–111)
Creatinine, Ser: 0.81 mg/dL (ref 0.44–1.00)
GFR calc Af Amer: >60 mL/min (ref >60)
GLUCOSE: 110 mg/dL — AB (ref 65–99)
POTASSIUM: 3.5 mmol/L (ref 3.5–5.1)
Sodium: 142 mmol/L (ref 135–145)
TOTAL PROTEIN: 6.9 g/dL (ref 6.5–8.1)

## 2015-06-26 LAB — CBC WITH DIFFERENTIAL/PLATELET
Basophils Absolute: 0.1 10*3/uL (ref 0.0–0.1)
Basophils Relative: 0 %
EOS PCT: 1 %
Eosinophils Absolute: 0.3 10*3/uL (ref 0.0–0.7)
HEMATOCRIT: 42 % (ref 36.0–46.0)
HEMOGLOBIN: 13.8 g/dL (ref 12.0–15.0)
LYMPHS ABS: 51.1 10*3/uL — AB (ref 0.7–4.0)
LYMPHS PCT: 86 %
MCH: 29.7 pg (ref 26.0–34.0)
MCHC: 32.9 g/dL (ref 30.0–36.0)
MCV: 90.3 fL (ref 78.0–100.0)
MONO ABS: 1.3 10*3/uL — AB (ref 0.1–1.0)
MONOS PCT: 2 %
NEUTROS ABS: 6.4 10*3/uL (ref 1.7–7.7)
Neutrophils Relative %: 11 %
Platelets: 264 10*3/uL (ref 150–400)
RBC: 4.65 MIL/uL (ref 3.87–5.11)
RDW: 15.4 % (ref 11.5–15.5)
WBC: 59.2 10*3/uL (ref 4.0–10.5)

## 2015-06-26 LAB — LACTATE DEHYDROGENASE: LDH: 155 U/L (ref 98–192)

## 2015-06-26 NOTE — Telephone Encounter (Signed)
..  CRITICAL VALUE ALERT Critical value received:  WBC 59.2 Date of notification:  06/26/2015 Time of notification: 1300 Critical value read back:  Yes.   Nurse who received alert:  TAR MD notified (1st page):  Dr. Whitney Muse

## 2015-06-26 NOTE — Progress Notes (Signed)
[place] [facility] [place] [facility], MD [address] [place] [address]    DIAGNOSIS:  #1 Locally advanced right breast cancer,  currently taking Megace having undergone neoadjuvant chemotherapy followed by right modified radical mastectomy followed by external beam radiotherapy followed by Megace which she continues to take since 1999.  #2. Chronic lymphocytic leukemia for the last 24 years, never having been treated.  #3. Abnormal mammogram of the left breast in October 2015 status post stereotactic biopsy. Pathology revealed a benign fibroadenoma.   CURRENT THERAPY: Observation  INTERVAL HISTORY: [name] 80 y.o. female returns for follow-up of CLL which she has had for many years and history of R breast cancer dating back to 1999.   She takes megace which she states she started in 1999. She reports at the time of her breast cancer diagnosis Premarin was discontinued and she was put on Megace for hot flashes. Last May she notes a letter from her insurance carrier that reported the medication was high risk. She was eventually taken off of it. She had significant worsening of her hot flashes and was started back on up by Dr. Stephenie Acres and she is not interested in discontinuing it.  [name] returns to the [facility] alone today.  She was in the hospital recently, near 06/05/2015, for a surgical intervention on a bowel obstruction, with Dr. Arnoldo Morale. Today she says that she's doing fine, her stomach is just sore where she had her incision.   She confirms that she is up to date on her mammogram, had one in October. She says she always has her mammograms done in October.  The only "new and exciting thing" that has happened to her recently was the bowel obstruction. She says she wouldn't wish it on her worst enemy.  She remarks that her holiday season was good and that she enjoyed herself.  She says that her  appetite is so-so, reporting "sometimes I feel like I want something to eat," but then she will say "nah" and wait until later on.  She denies night sweats or fever. No dramatic change in her performance status.    MEDICAL HISTORY: Past Medical History  Diagnosis Date  . CLL (chronic lymphocytic leukemia) ([facility]) 07/21/2011  . Breast cancer, stage 3 ([facility]) 07/21/2011  . Bronchitis     has HYPERLIPIDEMIA; ANXIETY; DEPRESSION; OSA (obstructive sleep apnea); ALLERGIC RHINITIS; ARTHRITIS; ARTHRITIS, RIGHT HIP; HIP PAIN, RIGHT; LOW BACK PAIN; OSTEOPENIA; FATIGUE; BREAST CANCER, HX OF; HX, PERSONAL, LEUKEMIA NOS; MIGRAINES, HX OF; CLL (chronic lymphocytic leukemia) ([facility]); Breast cancer, stage 3 ([facility]); Mass of arm; SBO (small bowel obstruction) ([facility]); and Hyperglycemia on her problem list.     is allergic to codeine and meclizine.  Ms. Bridenbaugh does not currently have medications on file.   SURGICAL HISTORY: Past Surgical History  Procedure Laterality Date  . Appendectomy    . Abdominal hysterectomy    . Cholecystectomy    . Mastectomy      right  . Total hip arthroplasty      right  . Laparotomy N/A 06/08/2015    Procedure: EXPLORATORY LAPAROTOMY;  Surgeon: Aviva Signs, MD;  Location: [facility];  Service: General;  Laterality: N/A;  . Lysis of adhesion N/A 06/08/2015    Procedure: LYSIS OF ADHESION;  Surgeon: Aviva Signs, MD;  Location: [facility];  Service: General;  Laterality: N/A;    SOCIAL HISTORY: Social History   Social History  .  Marital Status: Widowed    Spouse Name: N/A  . Number of Children: N/A  . Years of Education: 11   Occupational History  . Not on file.   Social History Main Topics  . Smoking status: Never Smoker   . Smokeless tobacco: Never Used  . Alcohol Use: No  . Drug Use: No  . Sexual Activity: Not Currently   Other Topics Concern  . Not on file   Social History Narrative    FAMILY HISTORY: Family History  Problem Relation Age of Onset  . Cancer     . Cancer Brother    Her father lived to be 21 Her mother lived to be 49 She had siblings that died of cancer   Review of Systems  Constitutional: Negative.   Eyes: Negative.   Respiratory: Negative.   Cardiovascular: Negative.   Gastrointestinal: Negative.       Recent surgical intervention for bowel obstruction; pain at the incision site Genitourinary: Negative.   Musculoskeletal:       R chest wall pain, intermittent. Improved by rubbing  Skin: Negative.   Neurological: Negative Endo/Heme/Allergies: Negative.   Psychiatric/Behavioral: Negative.    14 point review of systems was performed and is negative except as detailed under history of present illness and above   PHYSICAL EXAMINATION  ECOG PERFORMANCE STATUS: 0 - Asymptomatic  Filed Vitals:   06/26/15 1249  BP: 132/78  Pulse: 70  Temp: 97.7 F (36.5 C)  Resp: 18    Physical Exam  Constitutional: She is oriented to person, place, and time and well-developed, well-nourished, and in no distress.   Needs minor assistance onto exam table  HENT:  Head: Normocephalic and atraumatic.  Nose: Nose normal.  Mouth/Throat: Oropharynx is clear and moist. No oropharyngeal exudate.  Eyes: Conjunctivae and EOM are normal. Pupils are equal, round, and reactive to light. Right eye exhibits no discharge. Left eye exhibits no discharge. No scleral icterus.  Neck: Normal range of motion. Neck supple. No tracheal deviation present. No thyromegaly present.  Cardiovascular: Normal rate, regular rhythm and normal heart sounds.  Exam reveals no gallop and no friction rub.   Occassional pause. No murmur heard. Pulmonary/Chest: Effort normal and breath sounds normal. She has no wheezes. She has no   R mastectomy site exam benign. No palpable nodules. Scar tissue noted and restricted ROM noted. No palpable abnormality.  L breast exam without skin or nipple changs. No palpable masses. L axillae clear.   Abdominal: Soft. Bowel sounds are  normal. She exhibits no distension and no mass. There is no tenderness. There is no rebound and no guarding.   Well-healing midline abdominal incision surgical site. Musculoskeletal: Normal range of motion. She exhibits no edema.  Lymphadenopathy:    She has no cervical adenopathy.  Neurological: She is alert and oriented to person, place, and time. She has normal reflexes. No cranial nerve deficit. Gait normal. Coordination normal.  Skin: Skin is warm and dry. No rash noted.  Psychiatric: Mood, memory, affect and judgment normal.  Nursing note and vitals reviewed.   LABORATORY DATA: I have reviewed the data as listed.  CBC    Component Value Date/Time   WBC 59.2* 06/26/2015 1154   WBC 65.2* 10/12/2012 1038   RBC 4.65 06/26/2015 1154   RBC 4.61 06/24/2014 0944   RBC 4.81 10/12/2012 1038   HGB 13.8 06/26/2015 1154   HGB 13.5 10/12/2012 1038   HCT 42.0 06/26/2015 1154   HCT 42.7 10/12/2012 1038  PLT 264 06/26/2015 1154   PLT 241 10/12/2012 1038   MCV 90.3 06/26/2015 1154   MCV 88.9 10/12/2012 1038   MCH 29.7 06/26/2015 1154   MCH 28.1 10/12/2012 1038   MCHC 32.9 06/26/2015 1154   MCHC 31.6 10/12/2012 1038   RDW 15.4 06/26/2015 1154   RDW 14.4 10/12/2012 1038   LYMPHSABS 51.1* 06/26/2015 1154   LYMPHSABS 57.9* 10/12/2012 1038   MONOABS 1.3* 06/26/2015 1154   MONOABS 1.2* 10/12/2012 1038   EOSABS 0.3 06/26/2015 1154   EOSABS 0.4 10/12/2012 1038   BASOSABS 0.1 06/26/2015 1154   BASOSABS 0.3* 10/12/2012 1038   CMP     Component Value Date/Time   NA 142 06/26/2015 1154   NA 142 10/12/2012 1038   K 3.5 06/26/2015 1154   K 4.0 10/12/2012 1038   CL 111 06/26/2015 1154   CL 111* 10/12/2012 1038   CO2 21* 06/26/2015 1154   CO2 19* 10/12/2012 1038   GLUCOSE 110* 06/26/2015 1154   GLUCOSE 104* 10/12/2012 1038   BUN 17 06/26/2015 1154   BUN 16.1 10/12/2012 1038   CREATININE 0.81 06/26/2015 1154   CREATININE 0.9 10/12/2012 1038   CALCIUM 9.2 06/26/2015 1154   CALCIUM  9.0 10/12/2012 1038   PROT 6.9 06/26/2015 1154   PROT 6.6 10/12/2012 1038   ALBUMIN 4.1 06/26/2015 1154   ALBUMIN 3.7 10/12/2012 1038   AST 22 06/26/2015 1154   AST 20 10/12/2012 1038   ALT 20 06/26/2015 1154   ALT 20 10/12/2012 1038   ALKPHOS 57 06/26/2015 1154   ALKPHOS 67 10/12/2012 1038   BILITOT 0.8 06/26/2015 1154   BILITOT 0.57 10/12/2012 1038   GFRNONAA >60 06/26/2015 1154   GFRAA >60 06/26/2015 1154    ASSESSMENT and THERAPY PLAN:  CLL, diagnosed in 1999 Locally advanced carcinoma of the right breast status post mastectomy and radiation, 1999 Ongoing Megace for hot flashes with patient refusal to discontinue Shingles in early 2016 SBO s/p surgery with Dr. Arnoldo Morale on 06/08/2015  She is doing very well. She has no evidence of obvious recurrence of her breast cancer and remains disease-free. Her CLL is remarkably stable with no B symptoms and no obvious evidence of symptomatic progression requiring therapy. We will see her back in 6 months with repeat laboratory studies and physical exam. She has any further questions or concerns prior to follow-up she is advised to let us know.  She misplaced her bra prosthesis prescription and requests another one today. We have provided her with this.  She is recovering nicely from her surgery.   Orders Placed This Encounter  Procedures  . CBC with Differential    Standing Status: Future     Number of Occurrences:      Standing Expiration Date: 06/25/2016  . Comprehensive metabolic panel    Standing Status: Future     Number of Occurrences:      Standing Expiration Date: 06/25/2016  . Lactate dehydrogenase    Standing Status: Future     Number of Occurrences:      Standing Expiration Date: 06/25/2016   All questions were answered. The patient knows to call the clinic with any problems, questions or concerns. We can certainly see the patient much sooner if necessary.   This document serves as a record of services personally performed  by Ancil Linsey, MD. It was created on her behalf by Toni Amend, a trained medical scribe. The creation of this record is based on the scribe's personal observations and  the provider's statements to them. This document has been checked and approved by the attending provider.  I have reviewed the above documentation for accuracy and completeness, and I agree with the above.  This note was electronically signed.  Kelby Fam. Whitney Muse, MD

## 2015-06-26 NOTE — Patient Instructions (Addendum)
[place] at [facility] Discharge Instructions  RECOMMENDATIONS MADE BY THE CONSULTANT AND ANY TEST RESULTS WILL BE SENT TO YOUR REFERRING PHYSICIAN.    Exam and discussion by Dr Whitney Muse today Blood work looks good  WBC count was 59.2 Return to see the doctor in 6 months with labs Please call the clinic if you have any questions or concerns       Thank you for choosing [place] at [facility] to provide your oncology and hematology care.  To afford each patient quality time with our provider, please arrive at least 15 minutes before your scheduled appointment time.   Beginning January 23rd 2017 lab work for the [facility] will be done in the  Main lab at [facility] on 1st floor. If you have a lab appointment with the [facility] please come in thru the  Main Entrance and check in at the main information desk  You need to re-schedule your appointment should you arrive 10 or more minutes late.  We strive to give you quality time with our providers, and arriving late affects you and other patients whose appointments are after yours.  Also, if you no show three or more times for appointments you may be dismissed from the clinic at the providers discretion.     Again, thank you for choosing [facility].  Our hope is that these requests will decrease the amount of time that you wait before being seen by our physicians.       _____________________________________________________________  Should you have questions after your visit to [facility], please contact our office at [phone] between the hours of 8:30 a.m. and 4:30 p.m.  Voicemails left after 4:30 p.m. will not be returned until the following business day.  For prescription refill requests, have your pharmacy contact our office.

## 2015-07-22 ENCOUNTER — Other Ambulatory Visit (HOSPITAL_COMMUNITY): Payer: Self-pay | Admitting: Hematology & Oncology

## 2015-07-22 DIAGNOSIS — Z1231 Encounter for screening mammogram for malignant neoplasm of breast: Secondary | ICD-10-CM

## 2015-12-24 ENCOUNTER — Encounter (HOSPITAL_COMMUNITY): Payer: Self-pay | Admitting: Oncology

## 2015-12-24 ENCOUNTER — Encounter (HOSPITAL_COMMUNITY): Payer: Medicare HMO | Attending: Oncology | Admitting: Oncology

## 2015-12-24 ENCOUNTER — Other Ambulatory Visit (HOSPITAL_COMMUNITY)
Admission: RE | Admit: 2015-12-24 | Discharge: 2015-12-24 | Disposition: A | Discharge status: Home / Self Care | Payer: Medicare HMO | Source: Other Acute Inpatient Hospital | Attending: Family Medicine | Admitting: Family Medicine

## 2015-12-24 ENCOUNTER — Encounter (HOSPITAL_COMMUNITY): Payer: Medicare HMO

## 2015-12-24 DIAGNOSIS — Z79899 Other long term (current) drug therapy: Secondary | ICD-10-CM | POA: Diagnosis not present

## 2015-12-24 DIAGNOSIS — G4733 Obstructive sleep apnea (adult) (pediatric): Secondary | ICD-10-CM | POA: Insufficient documentation

## 2015-12-24 DIAGNOSIS — C9110 Chronic lymphocytic leukemia of B-cell type not having achieved remission: Secondary | ICD-10-CM | POA: Insufficient documentation

## 2015-12-24 DIAGNOSIS — E6609 Other obesity due to excess calories: Secondary | ICD-10-CM | POA: Insufficient documentation

## 2015-12-24 DIAGNOSIS — C50911 Malignant neoplasm of unspecified site of right female breast: Secondary | ICD-10-CM | POA: Diagnosis not present

## 2015-12-24 DIAGNOSIS — J449 Chronic obstructive pulmonary disease, unspecified: Secondary | ICD-10-CM | POA: Diagnosis present

## 2015-12-24 DIAGNOSIS — K210 Gastro-esophageal reflux disease with esophagitis: Secondary | ICD-10-CM | POA: Insufficient documentation

## 2015-12-24 DIAGNOSIS — C9111 Chronic lymphocytic leukemia of B-cell type in remission: Secondary | ICD-10-CM | POA: Diagnosis present

## 2015-12-24 DIAGNOSIS — Z9011 Acquired absence of right breast and nipple: Secondary | ICD-10-CM | POA: Diagnosis not present

## 2015-12-24 LAB — CBC WITH DIFFERENTIAL/PLATELET
BASOS ABS: 0 10*3/uL (ref 0.0–0.1)
Basophils Relative: 0 %
EOS ABS: 0.6 10*3/uL (ref 0.0–0.7)
Eosinophils Relative: 1 %
HCT: 43.9 % (ref 36.0–46.0)
Hemoglobin: 13.9 g/dL (ref 12.0–15.0)
LYMPHS ABS: 55.1 10*3/uL — AB (ref 0.7–4.0)
Lymphocytes Relative: 89 %
MCH: 28.8 pg (ref 26.0–34.0)
MCHC: 31.7 g/dL (ref 30.0–36.0)
MCV: 91.1 fL (ref 78.0–100.0)
MONO ABS: 0 10*3/uL — AB (ref 0.1–1.0)
MONOS PCT: 0 %
Neutro Abs: 6.2 10*3/uL (ref 1.7–7.7)
Neutrophils Relative %: 10 %
PLATELETS: 252 10*3/uL (ref 150–400)
RBC: 4.82 MIL/uL (ref 3.87–5.11)
RDW: 14.4 % (ref 11.5–15.5)
WBC: 61.9 10*3/uL — AB (ref 4.0–10.5)

## 2015-12-24 LAB — COMPREHENSIVE METABOLIC PANEL
ALBUMIN: 4.4 g/dL (ref 3.5–5.0)
ALT: 18 U/L (ref 14–54)
ANION GAP: 10 (ref 5–15)
AST: 21 U/L (ref 15–41)
Alkaline Phosphatase: 53 U/L (ref 38–126)
BUN: 20 mg/dL (ref 6–20)
CALCIUM: 9.3 mg/dL (ref 8.9–10.3)
CO2: 22 mmol/L (ref 22–32)
CREATININE: 0.87 mg/dL (ref 0.44–1.00)
Chloride: 106 mmol/L (ref 101–111)
GFR calc non Af Amer: >60 mL/min (ref >60)
GLUCOSE: 100 mg/dL — AB (ref 65–99)
POTASSIUM: 4.4 mmol/L (ref 3.5–5.1)
SODIUM: 138 mmol/L (ref 135–145)
Total Bilirubin: 0.7 mg/dL (ref 0.3–1.2)
Total Protein: 7.3 g/dL (ref 6.5–8.1)

## 2015-12-24 LAB — LIPID PANEL
CHOL/HDL RATIO: 5.8 ratio
Cholesterol: 173 mg/dL (ref 0–200)
HDL: 30 mg/dL — ABNORMAL LOW (ref >40)
LDL CALC: 116 mg/dL — AB (ref 0–99)
Triglycerides: 136 mg/dL (ref <150)
VLDL: 27 mg/dL (ref 0–40)

## 2015-12-24 LAB — LACTATE DEHYDROGENASE: LDH: 168 U/L (ref 98–192)

## 2015-12-24 NOTE — Patient Instructions (Signed)
[place] at [facility] Discharge Instructions  RECOMMENDATIONS MADE BY THE CONSULTANT AND ANY TEST RESULTS WILL BE SENT TO YOUR REFERRING PHYSICIAN.  You were seen by Gershon Mussel today. Return to center in 6 months with labs Mammogram in October  Please return to center with any related concerns  Thank you for choosing [place] at [facility] to provide your oncology and hematology care.  To afford each patient quality time with our provider, please arrive at least 15 minutes before your scheduled appointment time.   Beginning January 23rd 2017 lab work for the [facility] will be done in the  Main lab at [facility] on 1st floor. If you have a lab appointment with the [facility] please come in thru the  Main Entrance and check in at the main information desk  You need to re-schedule your appointment should you arrive 10 or more minutes late.  We strive to give you quality time with our providers, and arriving late affects you and other patients whose appointments are after yours.  Also, if you no show three or more times for appointments you may be dismissed from the clinic at the providers discretion.     Again, thank you for choosing [facility].  Our hope is that these requests will decrease the amount of time that you wait before being seen by our physicians.       _____________________________________________________________  Should you have questions after your visit to [facility], please contact our office at [phone] between the hours of 8:30 a.m. and 4:30 p.m.  Voicemails left after 4:30 p.m. will not be returned until the following business day.  For prescription refill requests, have your pharmacy contact our office.         Resources For Cancer Patients and their Caregivers ? American Cancer Society: Can assist with transportation, wigs, general needs, runs Look Good Feel Better.         248-646-7764 ? Cancer Care: Provides financial assistance, online support groups, medication/co-pay assistance.  1-800-813-HOPE 941-157-8439) ? [place] Assists [place] cancer patients and their families through emotional , educational and financial support.  360-541-2118 ? [place] DSS Where to apply for food stamps, Medicaid and utility assistance. 623-546-1423 ? [facility]: Transportation to medical appointments. 307-392-1507 ? Social Security Administration: May apply for disability if have a Stage IV cancer. (843) 053-3886 (959)648-4457 ? [employer], Disability and Transit Services: Assists with nutrition, care and transit needs. [place] Support Programs: @10RELATIVEDAYS @ > Cancer Support Group  2nd Tuesday of the month 1pm-2pm, Journey Room  > Creative Journey  3rd Tuesday of the month 1130am-1pm, Journey Room  > Look Good Feel Better  1st Wednesday of the month 10am-12 noon, Journey Room (Call [facility] to register [phone])

## 2015-12-24 NOTE — Progress Notes (Signed)
[name], MD [place] [address]  CLL (chronic lymphocytic leukemia) [facility]) - Plan: CBC with Differential, Comprehensive metabolic panel, Lactate dehydrogenase  Breast cancer, stage 3, right ([facility]) - Plan: CBC with Differential, Comprehensive metabolic panel  CURRENT THERAPY: Surveillance per NCCN guidelines.  INTERVAL HISTORY: [name] 80 y.o. female returns for followup of CLL, diagnosed in 1999, not in need of any therapy at this time. AND Locally advanced carcinoma of the right breast, S/P post mastectomy by Dr. Arnoldo Morale and radiation, 1999.  Ongoing Megace for hot flashes with patient refusal to discontinue.  She denies any complaints.  She denies any B symptoms and reports a strong appetite without weight loss.  Review of Systems  Constitutional: Negative.  Negative for chills, fever and weight loss.  HENT: Negative.   Eyes: Negative.   Respiratory: Negative.   Cardiovascular: Negative.   Gastrointestinal: Negative.   Genitourinary: Negative.   Musculoskeletal: Negative.   Skin: Negative.   Neurological: Negative.  Negative for weakness.  Endo/Heme/Allergies: Negative.   Psychiatric/Behavioral: Negative.     Past Medical History:  Diagnosis Date  . Breast cancer, stage 3 ([place]) 07/21/2011  . Bronchitis   . CLL (chronic lymphocytic leukemia) ([place]) 07/21/2011    Past Surgical History:  Procedure Laterality Date  . ABDOMINAL HYSTERECTOMY    . APPENDECTOMY    . CHOLECYSTECTOMY    . LAPAROTOMY N/A 06/08/2015   Procedure: EXPLORATORY LAPAROTOMY;  Surgeon: Aviva Signs, MD;  Location: [facility];  Service: General;  Laterality: N/A;  . LYSIS OF ADHESION N/A 06/08/2015   Procedure: LYSIS OF ADHESION;  Surgeon: Aviva Signs, MD;  Location: [facility];  Service: General;  Laterality: N/A;  . MASTECTOMY     right  . TOTAL HIP ARTHROPLASTY     right    Family History  Problem Relation Age of Onset  . Cancer Brother   . Cancer       Social History   Social History  . Marital status: Widowed    Spouse name: N/A  . Number of children: N/A  . Years of education: 45   Social History Main Topics  . Smoking status: Never Smoker  . Smokeless tobacco: Never Used  . Alcohol use No  . Drug use: No  . Sexual activity: Not Currently   Other Topics Concern  . None   Social History Narrative  . None     PHYSICAL EXAMINATION  ECOG PERFORMANCE STATUS: 1 - Symptomatic but completely ambulatory  Vitals:   12/24/15 1327  BP: (!) 127/91  Pulse: 88  Resp: 18  Temp: 99.3 F (37.4 C)    GENERAL:alert, no distress, well nourished, well developed, comfortable, cooperative, obese, smiling and unaccompanied SKIN: skin color, texture, turgor are normal, no rashes or significant lesions HEAD: Normocephalic, No masses, lesions, tenderness or abnormalities EYES: normal, EOMI, Conjunctiva are pink and non-injected EARS: External ears normal OROPHARYNX:lips, buccal mucosa, and tongue normal and mucous membranes are moist  NECK: supple, no adenopathy, thyroid normal size, non-tender, without nodularity, trachea midline LYMPH:  no palpable lymphadenopathy, no hepatosplenomegaly BREAST:left breast normal without mass, skin or nipple changes or axillary nodes, right post-mastectomy site well healed and free of suspicious changes LUNGS: clear to auscultation and percussion HEART: regular rate & rhythm, no murmurs, no gallops, S1 normal and S2 normal ABDOMEN:abdomen soft, non-tender and normal bowel sounds BACK: Back symmetric, no curvature. EXTREMITIES:less then 2 second capillary refill, no joint deformities,  effusion, or inflammation, no skin discoloration, no cyanosis  NEURO: alert & oriented x 3 with fluent speech, no focal motor/sensory deficits, gait normal   LABORATORY DATA: CBC    Component Value Date/Time   WBC 61.9 (HH) 12/24/2015 1317   RBC 4.82 12/24/2015 1317   HGB 13.9 12/24/2015 1317   HGB 13.5  10/12/2012 1038   HCT 43.9 12/24/2015 1317   HCT 42.7 10/12/2012 1038   PLT 252 12/24/2015 1317   PLT 241 10/12/2012 1038   MCV 91.1 12/24/2015 1317   MCV 88.9 10/12/2012 1038   MCH 28.8 12/24/2015 1317   MCHC 31.7 12/24/2015 1317   RDW 14.4 12/24/2015 1317   RDW 14.4 10/12/2012 1038   LYMPHSABS 55.1 (H) 12/24/2015 1317   LYMPHSABS 57.9 (H) 10/12/2012 1038   MONOABS 0.0 (L) 12/24/2015 1317   MONOABS 1.2 (H) 10/12/2012 1038   EOSABS 0.6 12/24/2015 1317   EOSABS 0.4 10/12/2012 1038   BASOSABS 0.0 12/24/2015 1317   BASOSABS 0.3 (H) 10/12/2012 1038      Chemistry      Component Value Date/Time   NA 138 12/24/2015 1317   NA 142 10/12/2012 1038   K 4.4 12/24/2015 1317   K 4.0 10/12/2012 1038   CL 106 12/24/2015 1317   CL 111 (H) 10/12/2012 1038   CO2 22 12/24/2015 1317   CO2 19 (L) 10/12/2012 1038   BUN 20 12/24/2015 1317   BUN 16.1 10/12/2012 1038   CREATININE 0.87 12/24/2015 1317   CREATININE 0.9 10/12/2012 1038      Component Value Date/Time   CALCIUM 9.3 12/24/2015 1317   CALCIUM 9.0 10/12/2012 1038   ALKPHOS 53 12/24/2015 1317   ALKPHOS 67 10/12/2012 1038   AST 21 12/24/2015 1317   AST 20 10/12/2012 1038   ALT 18 12/24/2015 1317   ALT 20 10/12/2012 1038   BILITOT 0.7 12/24/2015 1317   BILITOT 0.57 10/12/2012 1038        PENDING LABS:   RADIOGRAPHIC STUDIES:  No results found.   PATHOLOGY:    ASSESSMENT AND PLAN:  CLL (chronic lymphocytic leukemia) ([place]) CLL, diagnosed in 1999, not in need of any therapy at this time.  Labs today: CBC diff, CMET, LDH.  I personally reviewed and went over laboratory results with the patient.  The results are noted within this dictation.  She denies any B symptoms.  She does not meet criteria for treatment.  NCCN guidelines for indication for treatment of CLL are:  A. Eligible for clinical trial  B. Significant disease-related symptoms   1. Fatigue (severe)   2. Night sweats   3. Weight loss   4. Fever without  infection  C. Threatened end-organ function  D. Progressive bulky disease (spleen >6cm below costal margin, lymph nodes >10 cm)  E. Progressive anemia  F. Progressive thrombocytopenia.  Labs in 6 months: CBC diff, CMET, LDH.  Return in 6 months for follow-up.    Breast cancer, stage 3 ([facility]) Locally advanced carcinoma of the right breast, S/P post mastectomy by Dr. Arnoldo Morale and radiation, 1999.  Ongoing Megace for hot flashes with patient refusal to discontinue.  Labs today: CBC diff, CMET.  I personally reviewed and went over laboratory results with the patient.  The results are noted within this dictation.  Elevated WBC with lymphocyte count is noted and stable dating back many years.  No cytopenias noted today.  I personally reviewed and went over radiographic studies with the patient.  The results are noted within  this dictation.  Mammogram in October 2016 was negative.  She will be due for another screening mammogram in October 2017.  Labs in 6 months: CBC diff, CMET.    ORDERS PLACED FOR THIS ENCOUNTER: Orders Placed This Encounter  Procedures  . CBC with Differential  . Comprehensive metabolic panel  . Lactate dehydrogenase    MEDICATIONS PRESCRIBED THIS ENCOUNTER: No orders of the defined types were placed in this encounter.   THERAPY PLAN:  Surveillance  All questions were answered. The patient knows to call the clinic with any problems, questions or concerns. We can certainly see the patient much sooner if necessary.  Patient and plan discussed with Dr. Ancil Linsey and she is in agreement with the aforementioned.   This note is electronically signed by: Doy Mince 12/24/2015 9:49 PM

## 2015-12-24 NOTE — Assessment & Plan Note (Addendum)
Locally advanced carcinoma of the right breast, S/P post mastectomy by Dr. Arnoldo Morale and radiation, 1999.  Ongoing Megace for hot flashes with patient refusal to discontinue.  Labs today: CBC diff, CMET.  I personally reviewed and went over laboratory results with the patient.  The results are noted within this dictation.  Elevated WBC with lymphocyte count is noted and stable dating back many years.  No cytopenias noted today.  I personally reviewed and went over radiographic studies with the patient.  The results are noted within this dictation.  Mammogram in October 2016 was negative.  She will be due for another screening mammogram in October 2017.  Labs in 6 months: CBC diff, CMET.

## 2015-12-24 NOTE — Assessment & Plan Note (Addendum)
CLL, diagnosed in 1999, not in need of any therapy at this time.  Labs today: CBC diff, CMET, LDH.  I personally reviewed and went over laboratory results with the patient.  The results are noted within this dictation.  She denies any B symptoms.  She does not meet criteria for treatment.  NCCN guidelines for indication for treatment of CLL are:  A. Eligible for clinical trial  B. Significant disease-related symptoms   1. Fatigue (severe)   2. Night sweats   3. Weight loss   4. Fever without infection  C. Threatened end-organ function  D. Progressive bulky disease (spleen >6cm below costal margin, lymph nodes >10 cm)  E. Progressive anemia  F. Progressive thrombocytopenia.  Labs in 6 months: CBC diff, CMET, LDH.  Return in 6 months for follow-up.

## 2015-12-24 NOTE — Progress Notes (Unsigned)
CRITICAL VALUE ALERT Critical value received:  WBC 61.9 Date of notification:  12-23-2015 Time of notification: [id number] Critical value read back:  Yes.   Nurse who received alert:  C.Joey Lierman RN MD notified (1st Chantale Leugers):  Kirby Crigler PA-C

## 2015-12-25 LAB — HEMOGLOBIN A1C
HEMOGLOBIN A1C: 6.1 % — AB (ref 4.8–5.6)
MEAN PLASMA GLUCOSE: 128 mg/dL

## 2016-02-11 ENCOUNTER — Encounter (HOSPITAL_COMMUNITY): Payer: Self-pay | Admitting: Cardiology

## 2016-02-11 ENCOUNTER — Emergency Department (HOSPITAL_COMMUNITY)
Admission: EM | Admit: 2016-02-11 | Discharge: 2016-02-11 | Disposition: A | Discharge status: Home / Self Care | Payer: Medicare HMO | Attending: Emergency Medicine | Admitting: Emergency Medicine

## 2016-02-11 ENCOUNTER — Emergency Department (HOSPITAL_COMMUNITY): Payer: Medicare HMO

## 2016-02-11 DIAGNOSIS — Z853 Personal history of malignant neoplasm of breast: Secondary | ICD-10-CM | POA: Insufficient documentation

## 2016-02-11 DIAGNOSIS — Z7982 Long term (current) use of aspirin: Secondary | ICD-10-CM | POA: Diagnosis not present

## 2016-02-11 DIAGNOSIS — M1712 Unilateral primary osteoarthritis, left knee: Secondary | ICD-10-CM | POA: Insufficient documentation

## 2016-02-11 DIAGNOSIS — M25562 Pain in left knee: Secondary | ICD-10-CM | POA: Diagnosis present

## 2016-02-11 HISTORY — DX: Unspecified intestinal obstruction, unspecified as to partial versus complete obstruction: K56.609

## 2016-02-11 NOTE — Discharge Instructions (Signed)
I recommend using arthritis strength tylenol as discussed for your arthritis pain.  You may also benefit from heat therapy (heating pad) several times daily which can be soothing for arthritis pain.

## 2016-02-11 NOTE — ED Triage Notes (Signed)
Left knee pain times one week.  Denies any injury

## 2016-02-11 NOTE — ED Notes (Signed)
Pt back from x-ray.

## 2016-02-11 NOTE — ED Notes (Signed)
MD at bedside. 

## 2016-02-13 NOTE — ED Provider Notes (Signed)
[facility] DEPT Provider Note   CSN: [id number] Arrival date & time: 02/11/16  [id number]     History   Chief Complaint Chief Complaint  Patient presents with  . Knee Pain    HPI [name] is a 80 y.o. female with past medical history as outlined below presenting with left medial knee pain and swelling which has been present for approximately the past week. She denies injury or falls, no new activities or overuse, stating just her normal routine without increased lower extremity stress. Her pain is aching, intermittent, worsened when she first gets up to ambulate.  She has taken aspirin with mild improvement.  She does have a history of osteoarthritis but not specifically in that joint. She has had a right total hip arthroplasty.  The history is provided by the patient.    Past Medical History:  Diagnosis Date  . Bowel obstruction ([place])   . Breast cancer, stage 3 ([place]) 07/21/2011  . Bronchitis   . CLL (chronic lymphocytic leukemia) ([place]) 07/21/2011    Patient Active Problem List   Diagnosis Date Noted  . SBO (small bowel obstruction) ([place]) 06/05/2015  . Hyperglycemia 06/05/2015  . Mass of arm 11/15/2011  . CLL (chronic lymphocytic leukemia) ([place]) 07/21/2011  . Breast cancer, stage 3 ([place]) 07/21/2011  . ARTHRITIS, RIGHT HIP 09/23/2008  . HYPERLIPIDEMIA 04/25/2008  . OSA (obstructive sleep apnea) 01/16/2008  . FATIGUE 09/14/2007  . ANXIETY 01/08/2007  . DEPRESSION 01/08/2007  . ALLERGIC RHINITIS 01/08/2007  . ARTHRITIS 01/08/2007  . HIP PAIN, RIGHT 01/08/2007  . LOW BACK PAIN 01/08/2007  . OSTEOPENIA 01/08/2007  . MIGRAINES, HX OF 01/08/2007    Past Surgical History:  Procedure Laterality Date  . ABDOMINAL HYSTERECTOMY    . APPENDECTOMY    . CHOLECYSTECTOMY    . LAPAROTOMY N/A 06/08/2015   Procedure: EXPLORATORY LAPAROTOMY;  Surgeon: Aviva Signs, MD;  Location: [facility];  Service: General;  Laterality: N/A;  . LYSIS OF ADHESION N/A 06/08/2015   Procedure:  LYSIS OF ADHESION;  Surgeon: Aviva Signs, MD;  Location: [facility];  Service: General;  Laterality: N/A;  . MASTECTOMY     right  . TOTAL HIP ARTHROPLASTY     right    OB History    No data available       Home Medications    Prior to Admission medications   Medication Sig Start Date End Date Taking? Authorizing Provider  albuterol (PROVENTIL HFA;VENTOLIN HFA) 108 (90 BASE) MCG/ACT inhaler Inhale 2 puffs into the lungs every 6 (six) hours as needed for shortness of breath.     Historical Provider, MD  albuterol (PROVENTIL) (2.5 MG/3ML) 0.083% nebulizer solution Take 2.5 mg by nebulization every 4 (four) hours as needed for wheezing or shortness of breath.    Historical Provider, MD  aspirin 81 MG tablet Take 81 mg by mouth at bedtime.     Historical Provider, MD  cholecalciferol (VITAMIN D) 1000 UNITS tablet Take 1,000 Units by mouth daily.    Historical Provider, MD  gabapentin (NEURONTIN) 100 MG capsule Take 100-200 mg by mouth 2 (two) times daily. Take one capsule in AM, 2 capsules at bedtime 12/17/14   Historical Provider, MD  megestrol (MEGACE) 40 MG tablet TAKE ONE TABLET BY MOUTH DAILY AT BEDTIME. 04/28/15   Baird Cancer, PA-C  omeprazole (PRILOSEC) 20 MG capsule Take 20 mg by mouth at bedtime.     Historical Provider, MD    Family History Family History  Problem Relation  Age of Onset  . Cancer Brother   . Cancer      Social History Social History  Substance Use Topics  . Smoking status: Never Smoker  . Smokeless tobacco: Never Used  . Alcohol use No     Allergies   Codeine and Meclizine   Review of Systems Review of Systems  Constitutional: Negative for fever.  Musculoskeletal: Positive for arthralgias and joint swelling. Negative for myalgias.  Neurological: Negative for weakness and numbness.     Physical Exam Updated Vital Signs BP 121/98 (BP Location: Left Arm)   Pulse 88   Temp 99 F (37.2 C) (Oral)   Resp 20   Ht 5\' 2"  (1.575 m)   Wt 78 kg    SpO2 98%   BMI 31.46 kg/m   Physical Exam  Constitutional: She appears well-developed and well-nourished.  HENT:  Head: Atraumatic.  Neck: Normal range of motion.  Cardiovascular:  Pulses equal bilaterally  Musculoskeletal: She exhibits tenderness.       Left knee: She exhibits swelling. She exhibits normal range of motion, no effusion, no deformity, no erythema, normal alignment, no LCL laxity, normal meniscus and no MCL laxity. Tenderness found. Medial joint line tenderness noted. No patellar tendon tenderness noted.  Neurological: She is alert. She has normal strength. She displays normal reflexes. No sensory deficit.  Skin: Skin is warm and dry. No lesion noted. No erythema.  No increased warmth at left knee.  Psychiatric: She has a normal mood and affect.     ED Treatments / Results  Labs (all labs ordered are listed, but only abnormal results are displayed) Labs Reviewed - No data to display  EKG  EKG Interpretation None       Radiology Dg Knee Complete 4 Views Left  Result Date: 02/11/2016 CLINICAL DATA:  Medial knee pain for 1 week.  No known injury. EXAM: LEFT KNEE - COMPLETE 4+ VIEW COMPARISON:  None. FINDINGS: No evidence of fracture, dislocation, or joint effusion. Moderate medial joint space narrowing and degenerative spurring seen, consistent with osteoarthritis. No other osseous abnormality identified. IMPRESSION: No acute findings.  Moderate medial compartment osteoarthritis. Electronically Signed   By: Earle Gell M.D.   On: 02/11/2016 18:06    Procedures Procedures (including critical care time)  Medications Ordered in ED Medications - No data to display   Initial Impression / Assessment and Plan / ED Course  I have reviewed the triage vital signs and the nursing notes.  Pertinent labs & imaging results that were available during my care of the patient were reviewed by me and considered in my medical decision making (see chart for  details).  Clinical Course    Tylenol, heat tx. F/u with pcp if sx are not improved with this tx.  Exam not c/w septic joint or gout.  The patient appears reasonably screened and/or stabilized for discharge and I doubt any other medical condition or other [facility] requiring further screening, evaluation, or treatment in the ED at this time prior to discharge.   Final Clinical Impressions(s) / ED Diagnoses   Final diagnoses:  Primary osteoarthritis of left knee    New Prescriptions Discharge Medication List as of 02/11/2016  6:48 PM       Evalee Jefferson, PA-C 02/13/16 [place], MD 02/14/16 1806

## 2016-03-16 ENCOUNTER — Ambulatory Visit (HOSPITAL_COMMUNITY)
Admission: RE | Admit: 2016-03-16 | Discharge: 2016-03-16 | Disposition: A | Discharge status: Home / Self Care | Payer: Medicare HMO | Source: Ambulatory Visit | Attending: Hematology & Oncology | Admitting: Hematology & Oncology

## 2016-03-16 ENCOUNTER — Ambulatory Visit (HOSPITAL_COMMUNITY): Payer: Medicare HMO

## 2016-03-16 DIAGNOSIS — Z1231 Encounter for screening mammogram for malignant neoplasm of breast: Secondary | ICD-10-CM | POA: Insufficient documentation

## 2016-06-16 ENCOUNTER — Other Ambulatory Visit (HOSPITAL_COMMUNITY): Payer: Self-pay | Admitting: Oncology

## 2016-06-24 ENCOUNTER — Encounter: Payer: Self-pay | Admitting: Internal Medicine

## 2016-06-28 ENCOUNTER — Other Ambulatory Visit (HOSPITAL_COMMUNITY): Payer: Medicare HMO

## 2016-06-28 ENCOUNTER — Ambulatory Visit (HOSPITAL_COMMUNITY): Payer: Medicare HMO | Admitting: Hematology & Oncology

## 2016-08-03 ENCOUNTER — Encounter (HOSPITAL_COMMUNITY): Payer: Medicare HMO

## 2016-08-03 ENCOUNTER — Encounter (HOSPITAL_COMMUNITY): Payer: Self-pay | Admitting: Oncology

## 2016-08-03 ENCOUNTER — Encounter (HOSPITAL_COMMUNITY): Payer: Medicare HMO | Attending: Oncology | Admitting: Oncology

## 2016-08-03 ENCOUNTER — Other Ambulatory Visit: Payer: Self-pay | Admitting: Oncology

## 2016-08-03 VITALS — BP 126/68 | HR 80 | Temp 99.0°F | Resp 18 | Wt 185.4 lb

## 2016-08-03 DIAGNOSIS — Z96641 Presence of right artificial hip joint: Secondary | ICD-10-CM | POA: Diagnosis not present

## 2016-08-03 DIAGNOSIS — C50911 Malignant neoplasm of unspecified site of right female breast: Secondary | ICD-10-CM

## 2016-08-03 DIAGNOSIS — Z9011 Acquired absence of right breast and nipple: Secondary | ICD-10-CM | POA: Insufficient documentation

## 2016-08-03 DIAGNOSIS — F419 Anxiety disorder, unspecified: Secondary | ICD-10-CM | POA: Diagnosis not present

## 2016-08-03 DIAGNOSIS — N951 Menopausal and female climacteric states: Secondary | ICD-10-CM

## 2016-08-03 DIAGNOSIS — Z9221 Personal history of antineoplastic chemotherapy: Secondary | ICD-10-CM | POA: Insufficient documentation

## 2016-08-03 DIAGNOSIS — K56609 Unspecified intestinal obstruction, unspecified as to partial versus complete obstruction: Secondary | ICD-10-CM | POA: Diagnosis not present

## 2016-08-03 DIAGNOSIS — Z9049 Acquired absence of other specified parts of digestive tract: Secondary | ICD-10-CM | POA: Insufficient documentation

## 2016-08-03 DIAGNOSIS — R928 Other abnormal and inconclusive findings on diagnostic imaging of breast: Secondary | ICD-10-CM | POA: Diagnosis not present

## 2016-08-03 DIAGNOSIS — Z853 Personal history of malignant neoplasm of breast: Secondary | ICD-10-CM

## 2016-08-03 DIAGNOSIS — Z1231 Encounter for screening mammogram for malignant neoplasm of breast: Secondary | ICD-10-CM

## 2016-08-03 DIAGNOSIS — C9110 Chronic lymphocytic leukemia of B-cell type not having achieved remission: Secondary | ICD-10-CM

## 2016-08-03 DIAGNOSIS — D72829 Elevated white blood cell count, unspecified: Secondary | ICD-10-CM | POA: Diagnosis not present

## 2016-08-03 DIAGNOSIS — R0609 Other forms of dyspnea: Secondary | ICD-10-CM

## 2016-08-03 DIAGNOSIS — G4733 Obstructive sleep apnea (adult) (pediatric): Secondary | ICD-10-CM | POA: Insufficient documentation

## 2016-08-03 DIAGNOSIS — F329 Major depressive disorder, single episode, unspecified: Secondary | ICD-10-CM | POA: Diagnosis not present

## 2016-08-03 DIAGNOSIS — Z809 Family history of malignant neoplasm, unspecified: Secondary | ICD-10-CM | POA: Insufficient documentation

## 2016-08-03 DIAGNOSIS — E785 Hyperlipidemia, unspecified: Secondary | ICD-10-CM | POA: Diagnosis not present

## 2016-08-03 DIAGNOSIS — R0602 Shortness of breath: Secondary | ICD-10-CM | POA: Insufficient documentation

## 2016-08-03 LAB — COMPREHENSIVE METABOLIC PANEL
ALBUMIN: 4.2 g/dL (ref 3.5–5.0)
ALK PHOS: 48 U/L (ref 38–126)
ALT: 19 U/L (ref 14–54)
ANION GAP: 8 (ref 5–15)
AST: 24 U/L (ref 15–41)
BUN: 20 mg/dL (ref 6–20)
CALCIUM: 9 mg/dL (ref 8.9–10.3)
CHLORIDE: 105 mmol/L (ref 101–111)
CO2: 22 mmol/L (ref 22–32)
Creatinine, Ser: 0.9 mg/dL (ref 0.44–1.00)
GFR calc non Af Amer: 59 mL/min — ABNORMAL LOW (ref >60)
GLUCOSE: 107 mg/dL — AB (ref 65–99)
POTASSIUM: 4 mmol/L (ref 3.5–5.1)
SODIUM: 135 mmol/L (ref 135–145)
Total Bilirubin: 0.8 mg/dL (ref 0.3–1.2)
Total Protein: 7.1 g/dL (ref 6.5–8.1)

## 2016-08-03 LAB — CBC WITH DIFFERENTIAL/PLATELET
BASOS ABS: 0 10*3/uL (ref 0.0–0.1)
Basophils Relative: 0 %
EOS ABS: 0 10*3/uL (ref 0.0–0.7)
Eosinophils Relative: 0 %
HCT: 42 % (ref 36.0–46.0)
HEMOGLOBIN: 13.9 g/dL (ref 12.0–15.0)
LYMPHS ABS: 54.8 10*3/uL — AB (ref 0.7–4.0)
LYMPHS PCT: 88 %
MCH: 29.6 pg (ref 26.0–34.0)
MCHC: 33.1 g/dL (ref 30.0–36.0)
MCV: 89.6 fL (ref 78.0–100.0)
MONO ABS: 1.9 10*3/uL — AB (ref 0.1–1.0)
Monocytes Relative: 3 %
Neutro Abs: 5.6 10*3/uL (ref 1.7–7.7)
Neutrophils Relative %: 9 %
PLATELETS: 244 10*3/uL (ref 150–400)
RBC: 4.69 MIL/uL (ref 3.87–5.11)
RDW: 15.5 % (ref 11.5–15.5)
WBC: 62.3 10*3/uL (ref 4.0–10.5)

## 2016-08-03 LAB — LACTATE DEHYDROGENASE: LDH: 157 U/L (ref 98–192)

## 2016-08-03 NOTE — Patient Instructions (Signed)
[facility] at [facility] Discharge Instructions  RECOMMENDATIONS MADE BY THE CONSULTANT AND ANY TEST RESULTS WILL BE SENT TO YOUR REFERRING PHYSICIAN.  You were seen today by Dr. Louise Zhou Follow up in 6 months with lab work See Amy up front for appointments   Thank you for choosing [facility] at [facility] to provide your oncology and hematology care.  To afford each patient quality time with our provider, please arrive at least 15 minutes before your scheduled appointment time.    If you have a lab appointment with the Cancer Center please come in thru the  Main Entrance and check in at the main information desk  You need to re-schedule your appointment should you arrive 10 or more minutes late.  We strive to give you quality time with our providers, and arriving late affects you and other patients whose appointments are after yours.  Also, if you no show three or more times for appointments you may be dismissed from the clinic at the providers discretion.     Again, thank you for choosing [facility].  Our hope is that these requests will decrease the amount of time that you wait before being seen by our physicians.       _____________________________________________________________  Should you have questions after your visit to [facility], please contact our office at [phone] between the hours of 8:30 a.m. and 4:30 p.m.  Voicemails left after 4:30 p.m. will not be returned until the following business day.  For prescription refill requests, have your pharmacy contact our office.       Resources For Cancer Patients and their Caregivers ? American Cancer Society: Can assist with transportation, wigs, general needs, runs Look Good Feel Better.        1-888-227-6333 ? Cancer Care: Provides financial assistance, online support groups, medication/co-pay assistance.  1-800-813-HOPE (4673) ? [facility] Assists [place] cancer patients and their families through emotional , educational and financial support.  336-427-4357 ? [place] DSS Where to apply for food stamps, Medicaid and utility assistance. 336-342-1394 ? RCATS: Transportation to medical appointments. 336-347-2287 ? Social Security Administration: May apply for disability if have a Stage IV cancer. 336-342-7796 1-800-772-1213 ? [place] Aging, Disability and Transit Services: Assists with nutrition, care and transit needs. 336-349-2343  Cancer Center Support Programs: @[email or web address]@ > Cancer Support Group  2nd Tuesday of the month 1pm-2pm, Journey Room  > Creative Journey  3rd Tuesday of the month 1130am-1pm, Journey Room  > Look Good Feel Better  1st Wednesday of the month 10am-12 noon, Journey Room (Call American Cancer Society to register 1-800-395-5775)    

## 2016-08-03 NOTE — Progress Notes (Signed)
[facility] [place], MD [address] [place] [address]  DIAGNOSIS:  #1 Locally advanced right breast cancer,  currently taking Megace having undergone neoadjuvant chemotherapy followed by right modified radical mastectomy followed by external beam radiotherapy followed by Megace which she continues to take since 1999.  #2. Chronic lymphocytic leukemia for the last 24 years, never having been treated.  #3. Abnormal mammogram of the left breast in October 2015 status post stereotactic biopsy. Pathology revealed a benign fibroadenoma.   CURRENT THERAPY: Observation  INTERVAL HISTORY: [name] 81 y.o. female returns for follow-up of CLL which she has had for 28 years and history of R breast cancer dating back to 1999.   She is doing well today. She still has some fatigue. Her hot flashes are still present, but improving with megace. Her appetite is good. She has some SOB with exertion and arthritis in her left knee. She ambulates with the use of a cane. Denies chest pain, abdominal pain, diarrhea, constipation, or any other concerns.   MEDICAL HISTORY: Past Medical History:  Diagnosis Date  . Bowel obstruction   . Breast cancer, stage 3 ([place]) 07/21/2011  . Bronchitis   . CLL (chronic lymphocytic leukemia) ([place]) 07/21/2011    has HYPERLIPIDEMIA; ANXIETY; DEPRESSION; OSA (obstructive sleep apnea); ALLERGIC RHINITIS; ARTHRITIS; ARTHRITIS, RIGHT HIP; HIP PAIN, RIGHT; LOW BACK PAIN; OSTEOPENIA; FATIGUE; MIGRAINES, HX OF; CLL (chronic lymphocytic leukemia) ([place]); Breast cancer, stage 3 ([place]); Mass of arm; SBO (small bowel obstruction); and Hyperglycemia on her problem list.     is allergic to codeine and meclizine.  [name] does not currently have medications on file.   SURGICAL HISTORY: Past Surgical History:  Procedure Laterality Date  . ABDOMINAL HYSTERECTOMY    . APPENDECTOMY    . CHOLECYSTECTOMY     . LAPAROTOMY N/A 06/08/2015   Procedure: EXPLORATORY LAPAROTOMY;  Surgeon: Aviva Signs, MD;  Location: [facility];  Service: General;  Laterality: N/A;  . LYSIS OF ADHESION N/A 06/08/2015   Procedure: LYSIS OF ADHESION;  Surgeon: Aviva Signs, MD;  Location: [facility];  Service: General;  Laterality: N/A;  . MASTECTOMY     right  . TOTAL HIP ARTHROPLASTY     right    SOCIAL HISTORY: Social History   Social History  . Marital status: Widowed    Spouse name: N/A  . Number of children: N/A  . Years of education: 44   Occupational History  . Not on file.   Social History Main Topics  . Smoking status: Never Smoker  . Smokeless tobacco: Never Used  . Alcohol use No  . Drug use: No  . Sexual activity: Not Currently   Other Topics Concern  . Not on file   Social History Narrative  . No narrative on file    FAMILY HISTORY: Family History  Problem Relation Age of Onset  . Cancer Brother   . Cancer     Her father lived to be 13 Her mother lived to be 21 She had siblings that died of cancer  Review of Systems  Constitutional: Positive for malaise/fatigue.       Hot flashes No loss of appetite  HENT: Negative.   Eyes: Negative.   Respiratory: Positive for shortness of breath (with exertion).   Cardiovascular: Negative.  Negative for chest pain.  Gastrointestinal: Negative.  Negative for abdominal pain, constipation and diarrhea.  Genitourinary: Negative.   Musculoskeletal:  Positive for joint pain (L knee from arthritis).  Skin: Negative.   Neurological: Negative.   Endo/Heme/Allergies: Negative.   Psychiatric/Behavioral: Negative.   All other systems reviewed and are negative. 14 point review of systems was performed and is negative except as detailed under history of present illness and above  PHYSICAL EXAMINATION ECOG PERFORMANCE STATUS: 0 - Asymptomatic   Physical Exam  Constitutional: She is oriented to person, place, and time and well-developed,  well-nourished, and in no distress.  HENT:  Head: Normocephalic and atraumatic.  Eyes: EOM are normal. Pupils are equal, round, and reactive to light.  Neck: Normal range of motion. Neck supple.  Cardiovascular: Normal rate, regular rhythm and normal heart sounds.   Pulmonary/Chest: Effort normal and breath sounds normal.  Abdominal: Soft. Bowel sounds are normal.  Musculoskeletal: Normal range of motion.  Neurological: She is alert and oriented to person, place, and time. Gait normal.  Skin: Skin is warm and dry.  Nursing note and vitals reviewed.  LABORATORY DATA: I have reviewed the data as listed.  CBC    Component Value Date/Time   WBC 62.3 (HH) 08/03/2016 1343   RBC 4.69 08/03/2016 1343   HGB 13.9 08/03/2016 1343   HGB 13.5 10/12/2012 1038   HCT 42.0 08/03/2016 1343   HCT 42.7 10/12/2012 1038   PLT 244 08/03/2016 1343   PLT 241 10/12/2012 1038   MCV 89.6 08/03/2016 1343   MCV 88.9 10/12/2012 1038   MCH 29.6 08/03/2016 1343   MCHC 33.1 08/03/2016 1343   RDW 15.5 08/03/2016 1343   RDW 14.4 10/12/2012 1038   LYMPHSABS 54.8 (H) 08/03/2016 1343   LYMPHSABS 57.9 (H) 10/12/2012 1038   MONOABS 1.9 (H) 08/03/2016 1343   MONOABS 1.2 (H) 10/12/2012 1038   EOSABS 0.0 08/03/2016 1343   EOSABS 0.4 10/12/2012 1038   BASOSABS 0.0 08/03/2016 1343   BASOSABS 0.3 (H) 10/12/2012 1038   CMP     Component Value Date/Time   NA 135 08/03/2016 1343   NA 142 10/12/2012 1038   K 4.0 08/03/2016 1343   K 4.0 10/12/2012 1038   CL 105 08/03/2016 1343   CL 111 (H) 10/12/2012 1038   CO2 22 08/03/2016 1343   CO2 19 (L) 10/12/2012 1038   GLUCOSE 107 (H) 08/03/2016 1343   GLUCOSE 104 (H) 10/12/2012 1038   BUN 20 08/03/2016 1343   BUN 16.1 10/12/2012 1038   CREATININE 0.90 08/03/2016 1343   CREATININE 0.9 10/12/2012 1038   CALCIUM 9.0 08/03/2016 1343   CALCIUM 9.0 10/12/2012 1038   PROT 7.1 08/03/2016 1343   PROT 6.6 10/12/2012 1038   ALBUMIN 4.2 08/03/2016 1343   ALBUMIN 3.7  10/12/2012 1038   AST 24 08/03/2016 1343   AST 20 10/12/2012 1038   ALT 19 08/03/2016 1343   ALT 20 10/12/2012 1038   ALKPHOS 48 08/03/2016 1343   ALKPHOS 67 10/12/2012 1038   BILITOT 0.8 08/03/2016 1343   BILITOT 0.57 10/12/2012 1038   GFRNONAA 59 (L) 08/03/2016 1343   GFRAA >60 08/03/2016 1343    ASSESSMENT and THERAPY PLAN:  CLL, diagnosed in 1999 RAI Stage 0 Locally advanced carcinoma of the right breast status post mastectomy and radiation, 1999 Ongoing Megace for hot flashes with patient refusal to discontinue Shingles in early 2016 SBO s/p surgery with Dr. Arnoldo Morale on 06/08/2015  Patient is doing well.   I reviewed the labs with the patient. Her leukocytosis is progressing very slowing, no evidence of anemia or thrombocytopenia. Continue monitoring.  Next left mammogram 02/2017.  She will return for follow up in 6 months with labs, CBC, CMP.   No orders of the defined types were placed in this encounter.  All questions were answered. The patient knows to call the clinic with any problems, questions or concerns. We can certainly see the patient much sooner if necessary.   This document serves as a record of services personally performed by Twana First, MD. It was created on her behalf by Martinique Casey, a trained medical scribe. The creation of this record is based on the scribe's personal observations and the provider's statements to them. This document has been checked and approved by the attending provider.  I have reviewed the above documentation for accuracy and completeness, and I agree with the above.  This note was electronically signed.

## 2016-10-05 ENCOUNTER — Other Ambulatory Visit (HOSPITAL_COMMUNITY): Payer: Self-pay | Admitting: Oncology

## 2017-02-02 ENCOUNTER — Other Ambulatory Visit (HOSPITAL_COMMUNITY): Payer: Medicare HMO

## 2017-02-02 ENCOUNTER — Ambulatory Visit (HOSPITAL_COMMUNITY): Payer: Medicare HMO

## 2017-02-02 ENCOUNTER — Encounter (HOSPITAL_COMMUNITY): Payer: Medicare HMO | Attending: Oncology | Admitting: Oncology

## 2017-02-02 ENCOUNTER — Encounter (HOSPITAL_COMMUNITY): Payer: Self-pay

## 2017-02-02 ENCOUNTER — Encounter (HOSPITAL_COMMUNITY): Payer: Medicare HMO | Attending: Oncology

## 2017-02-02 VITALS — BP 116/76 | HR 86 | Temp 98.3°F | Resp 20 | Wt 190.7 lb

## 2017-02-02 DIAGNOSIS — C9110 Chronic lymphocytic leukemia of B-cell type not having achieved remission: Secondary | ICD-10-CM

## 2017-02-02 DIAGNOSIS — R5382 Chronic fatigue, unspecified: Secondary | ICD-10-CM | POA: Diagnosis not present

## 2017-02-02 DIAGNOSIS — Z853 Personal history of malignant neoplasm of breast: Secondary | ICD-10-CM | POA: Diagnosis not present

## 2017-02-02 LAB — CBC WITH DIFFERENTIAL/PLATELET
Basophils Absolute: 0 10*3/uL (ref 0.0–0.1)
Basophils Relative: 0 %
Eosinophils Absolute: 0.6 10*3/uL (ref 0.0–0.7)
Eosinophils Relative: 1 %
HCT: 41.9 % (ref 36.0–46.0)
Hemoglobin: 13.6 g/dL (ref 12.0–15.0)
Lymphocytes Relative: 88 %
Lymphs Abs: 55.4 10*3/uL — ABNORMAL HIGH (ref 0.7–4.0)
MCH: 29.5 pg (ref 26.0–34.0)
MCHC: 32.5 g/dL (ref 30.0–36.0)
MCV: 90.9 fL (ref 78.0–100.0)
Monocytes Absolute: 1.3 10*3/uL — ABNORMAL HIGH (ref 0.1–1.0)
Monocytes Relative: 2 %
Neutro Abs: 5.7 10*3/uL (ref 1.7–7.7)
Neutrophils Relative %: 9 %
Platelets: 240 10*3/uL (ref 150–400)
RBC: 4.61 MIL/uL (ref 3.87–5.11)
RDW: 15.2 % (ref 11.5–15.5)
WBC: 63 10*3/uL (ref 4.0–10.5)

## 2017-02-02 LAB — COMPREHENSIVE METABOLIC PANEL
ALK PHOS: 49 U/L (ref 38–126)
ALT: 23 U/L (ref 14–54)
AST: 24 U/L (ref 15–41)
Albumin: 4.1 g/dL (ref 3.5–5.0)
Anion gap: 10 (ref 5–15)
BUN: 18 mg/dL (ref 6–20)
CALCIUM: 9.2 mg/dL (ref 8.9–10.3)
CO2: 20 mmol/L — AB (ref 22–32)
CREATININE: 0.93 mg/dL (ref 0.44–1.00)
Chloride: 109 mmol/L (ref 101–111)
GFR calc non Af Amer: 57 mL/min — ABNORMAL LOW (ref >60)
Glucose, Bld: 151 mg/dL — ABNORMAL HIGH (ref 65–99)
Potassium: 4.1 mmol/L (ref 3.5–5.1)
SODIUM: 139 mmol/L (ref 135–145)
Total Bilirubin: 0.5 mg/dL (ref 0.3–1.2)
Total Protein: 6.8 g/dL (ref 6.5–8.1)

## 2017-02-02 NOTE — Progress Notes (Signed)
CRITICAL VALUE ALERT Critical value received:  WBC-63 Date of notification:  02/02/17 Time of notification: 9509 Critical value read back:  Yes.   Nurse who received alert:  M.Shevette Bess,LPN MD notified (1st page):  L.Talbert Cage, MD

## 2017-02-02 NOTE — Progress Notes (Signed)
Patient is a fall risk. Yellow fall risk band applied.

## 2017-02-02 NOTE — Patient Instructions (Signed)
[facility] at [facility] Discharge Instructions  RECOMMENDATIONS MADE BY THE CONSULTANT AND ANY TEST RESULTS WILL BE SENT TO YOUR REFERRING PHYSICIAN.  You saw Dr. Zhou today.  Thank you for choosing [facility] at [facility] to provide your oncology and hematology care.  To afford each patient quality time with our provider, please arrive at least 15 minutes before your scheduled appointment time.    If you have a lab appointment with the Cancer Center please come in thru the  Main Entrance and check in at the main information desk  You need to re-schedule your appointment should you arrive 10 or more minutes late.  We strive to give you quality time with our providers, and arriving late affects you and other patients whose appointments are after yours.  Also, if you no show three or more times for appointments you may be dismissed from the clinic at the providers discretion.     Again, thank you for choosing [facility].  Our hope is that these requests will decrease the amount of time that you wait before being seen by our physicians.       _____________________________________________________________  Should you have questions after your visit to  Cancer Center, please contact our office at [phone] between the hours of 8:30 a.m. and 4:30 p.m.  Voicemails left after 4:30 p.m. will not be returned until the following business day.  For prescription refill requests, have your pharmacy contact our office.       Resources For Cancer Patients and their Caregivers ? American Cancer Society: Can assist with transportation, wigs, general needs, runs Look Good Feel Better.        1-888-227-6333 ? Cancer Care: Provides financial assistance, online support groups, medication/co-pay assistance.  1-800-813-HOPE (4673) ? [facility] Assists [place] cancer patients and their families through  emotional , educational and financial support.  336-427-4357 ? [place] DSS Where to apply for food stamps, Medicaid and utility assistance. 336-342-1394 ? RCATS: Transportation to medical appointments. 336-347-2287 ? Social Security Administration: May apply for disability if have a Stage IV cancer. 336-342-7796 1-800-772-1213 ? [place] Aging, Disability and Transit Services: Assists with nutrition, care and transit needs. 336-349-2343  Cancer Center Support Programs: @[email or web address]@ > Cancer Support Group  2nd Tuesday of the month 1pm-2pm, Journey Room  > Creative Journey  3rd Tuesday of the month 1130am-1pm, Journey Room  > Look Good Feel Better  1st Wednesday of the month 10am-12 noon, Journey Room (Call American Cancer Society to register 1-800-395-5775)    

## 2017-02-02 NOTE — Progress Notes (Signed)
[facility] [place], [place], [place] [address]  DIAGNOSIS:  #1 Locally advanced right breast cancer,  currently taking Megace having undergone neoadjuvant chemotherapy followed by right modified radical mastectomy followed by external beam radiotherapy followed by Megace which she continues to take since 1999.  #2. Chronic lymphocytic leukemia for the last 24 years, never having been treated.  #3. Abnormal mammogram of the left breast in October 2015 status post stereotactic biopsy. Pathology revealed a benign fibroadenoma.   CURRENT THERAPY: Observation  INTERVAL HISTORY: [name] 81 y.o. female returns for follow-up of CLL which she has had for 28 years and history of R breast cancer dating back to 1999.   She is doing well today. She has chronic fatigue. Her hot flashes are still present, but improved with megace. Her appetite is good and she is maintaining her weight. She has some SOB with exertion and arthritis in her left knee and left shoulder. She ambulates with the use of a cane. She has chronic headaches for which she takes gabapentin. She also has chronic dizziness which is the same and has not worsened. Patient states she's been having a sharp pain which starts in her right axilla and radiates across her chest to her left breast. She denies any drenching night sweats, unexplained fevers/chills. Denies chest pain, abdominal pain, diarrhea, constipation, or any other concerns.   MEDICAL HISTORY: Past Medical History:  Diagnosis Date  . Bowel obstruction   . Breast cancer, stage 3 ([place]) 07/21/2011  . Bronchitis   . CLL (chronic lymphocytic leukemia) ([place]) 07/21/2011    has HYPERLIPIDEMIA; ANXIETY; DEPRESSION; OSA (obstructive sleep apnea); ALLERGIC RHINITIS; ARTHRITIS; ARTHRITIS, RIGHT HIP; HIP PAIN, RIGHT; LOW BACK PAIN; OSTEOPENIA; FATIGUE; MIGRAINES, HX OF; CLL (chronic lymphocytic leukemia)  ([place] [place]); Breast cancer, stage 3 ([facility]); Mass of arm; SBO (small bowel obstruction) ([place]); and Hyperglycemia on her problem list.     is allergic to codeine and meclizine.  Ms. Shakir had no medications administered during this visit.   SURGICAL HISTORY: Past Surgical History:  Procedure Laterality Date  . ABDOMINAL HYSTERECTOMY    . APPENDECTOMY    . CHOLECYSTECTOMY    . LAPAROTOMY N/A 06/08/2015   Procedure: EXPLORATORY LAPAROTOMY;  Surgeon: Aviva Signs, MD;  Location: [facility];  Service: General;  Laterality: N/A;  . LYSIS OF ADHESION N/A 06/08/2015   Procedure: LYSIS OF ADHESION;  Surgeon: Aviva Signs, MD;  Location: [facility];  Service: General;  Laterality: N/A;  . MASTECTOMY     right  . TOTAL HIP ARTHROPLASTY     right    SOCIAL HISTORY: Social History   Social History  . Marital status: Widowed    Spouse name: N/A  . Number of children: N/A  . Years of education: 51   Occupational History  . Not on file.   Social History Main Topics  . Smoking status: Never Smoker  . Smokeless tobacco: Never Used  . Alcohol use No  . Drug use: No  . Sexual activity: Not Currently   Other Topics Concern  . Not on file   Social History Narrative  . No narrative on file    FAMILY HISTORY: Family History  Problem Relation Age of Onset  . Cancer Brother   . Cancer Unknown    Her father lived to be 58 Her mother lived to be 42 She had siblings that died of cancer  Review of  Systems  Constitutional: Positive for malaise/fatigue.       Hot flashes No loss of appetite  HENT: Negative.   Eyes: Negative.   Respiratory: Negative for shortness of breath.   Cardiovascular: Negative.  Negative for chest pain.  Gastrointestinal: Negative.  Negative for abdominal pain, constipation and diarrhea.  Genitourinary: Negative.   Musculoskeletal: Positive for joint pain (L knee from arthritis, L shoulder).  Skin: Negative.   Neurological: Positive for dizziness and headaches.    Endo/Heme/Allergies: Negative.   Psychiatric/Behavioral: Negative.   All other systems reviewed and are negative. 14 point review of systems was performed and is negative except as detailed under history of present illness and above  PHYSICAL EXAMINATION ECOG PERFORMANCE STATUS: 0 - Asymptomatic   Physical Exam  Constitutional: She is oriented to person, place, and time and well-developed, well-nourished, and in no distress.  HENT:  Head: Normocephalic and atraumatic.  Eyes: Pupils are equal, round, and reactive to light. EOM are normal.  Neck: Normal range of motion. Neck supple.  Cardiovascular: Normal rate, regular rhythm and normal heart sounds.   Pulmonary/Chest: Effort normal and breath sounds normal.  Abdominal: Soft. Bowel sounds are normal.  Musculoskeletal: Normal range of motion.  Neurological: She is alert and oriented to person, place, and time. Gait normal.  Skin: Skin is warm and dry.  Nursing note and vitals reviewed. Breast: Left breast without masses, nipple discharge, skin changes, or axillary lymphadenopathy. Right chest wall without any subcutaneous nodules, no right axillary lymphadenopathy.  LABORATORY DATA: I have reviewed the data as listed.  CBC    Component Value Date/Time   WBC 63.0 (HH) 02/02/2017 1009   RBC 4.61 02/02/2017 1009   HGB 13.6 02/02/2017 1009   HGB 13.5 10/12/2012 1038   HCT 41.9 02/02/2017 1009   HCT 42.7 10/12/2012 1038   PLT 240 02/02/2017 1009   PLT 241 10/12/2012 1038   MCV 90.9 02/02/2017 1009   MCV 88.9 10/12/2012 1038   MCH 29.5 02/02/2017 1009   MCHC 32.5 02/02/2017 1009   RDW 15.2 02/02/2017 1009   RDW 14.4 10/12/2012 1038   LYMPHSABS 55.4 (H) 02/02/2017 1009   LYMPHSABS 57.9 (H) 10/12/2012 1038   MONOABS 1.3 (H) 02/02/2017 1009   MONOABS 1.2 (H) 10/12/2012 1038   EOSABS 0.6 02/02/2017 1009   EOSABS 0.4 10/12/2012 1038   BASOSABS 0.0 02/02/2017 1009   BASOSABS 0.3 (H) 10/12/2012 1038   CMP     Component Value  Date/Time   NA 139 02/02/2017 1009   NA 142 10/12/2012 1038   K 4.1 02/02/2017 1009   K 4.0 10/12/2012 1038   CL 109 02/02/2017 1009   CL 111 (H) 10/12/2012 1038   CO2 20 (L) 02/02/2017 1009   CO2 19 (L) 10/12/2012 1038   GLUCOSE 151 (H) 02/02/2017 1009   GLUCOSE 104 (H) 10/12/2012 1038   BUN 18 02/02/2017 1009   BUN 16.1 10/12/2012 1038   CREATININE 0.93 02/02/2017 1009   CREATININE 0.9 10/12/2012 1038   CALCIUM 9.2 02/02/2017 1009   CALCIUM 9.0 10/12/2012 1038   PROT 6.8 02/02/2017 1009   PROT 6.6 10/12/2012 1038   ALBUMIN 4.1 02/02/2017 1009   ALBUMIN 3.7 10/12/2012 1038   AST 24 02/02/2017 1009   AST 20 10/12/2012 1038   ALT 23 02/02/2017 1009   ALT 20 10/12/2012 1038   ALKPHOS 49 02/02/2017 1009   ALKPHOS 67 10/12/2012 1038   BILITOT 0.5 02/02/2017 1009   BILITOT 0.57 10/12/2012 1038  GFRNONAA 57 (L) 02/02/2017 1009   GFRAA >60 02/02/2017 1009    ASSESSMENT and THERAPY PLAN:  1. CLL, diagnosed in 1999 RAI Stage 0  -clinically stable. Patient's WBC has been stable in the 60k range. No evidence of worsening anemia or thrombocytopenia or B symptoms.  -No indication to start treatment, continue observation of blood counts at this time. -CBC, CMP on next visit. Reviewed her labs from today with her in detail.  2. Locally advanced carcinoma of the right breast status post mastectomy and radiation, 1999 -Clinically NED on breast exam today.  -Next left mammogram 02/2017, pt aware of appointment.  She will return for follow up in 6 months with labs, CBC, CMP.   Orders Placed This Encounter  Procedures  . Comprehensive metabolic panel    Standing Status:   Future    Standing Expiration Date:   02/02/2018  . CBC with Differential    Standing Status:   Future    Standing Expiration Date:   02/02/2018   All questions were answered. The patient knows to call the clinic with any problems, questions or concerns. We can certainly see the patient much sooner if necessary.    This note was electronically signed.  Twana First, MD

## 2017-03-20 ENCOUNTER — Ambulatory Visit (HOSPITAL_COMMUNITY)
Admission: RE | Admit: 2017-03-20 | Discharge: 2017-03-20 | Disposition: A | Discharge status: Home / Self Care | Payer: Medicare HMO | Source: Ambulatory Visit | Attending: Oncology | Admitting: Oncology

## 2017-03-20 ENCOUNTER — Encounter (HOSPITAL_COMMUNITY): Payer: Self-pay

## 2017-03-20 DIAGNOSIS — Z1231 Encounter for screening mammogram for malignant neoplasm of breast: Secondary | ICD-10-CM | POA: Insufficient documentation

## 2017-04-14 ENCOUNTER — Other Ambulatory Visit (HOSPITAL_COMMUNITY): Payer: Self-pay | Admitting: Oncology

## 2017-04-21 ENCOUNTER — Other Ambulatory Visit: Payer: Self-pay | Admitting: Oncology

## 2017-04-21 DIAGNOSIS — Z1231 Encounter for screening mammogram for malignant neoplasm of breast: Secondary | ICD-10-CM

## 2017-05-11 ENCOUNTER — Other Ambulatory Visit: Payer: Self-pay

## 2017-05-11 ENCOUNTER — Encounter: Payer: Self-pay | Admitting: Cardiovascular Disease

## 2017-05-11 ENCOUNTER — Ambulatory Visit (INDEPENDENT_AMBULATORY_CARE_PROVIDER_SITE_OTHER): Payer: Medicare HMO | Admitting: Cardiovascular Disease

## 2017-05-11 VITALS — BP 113/75 | HR 88 | Ht 62.0 in | Wt 186.0 lb

## 2017-05-11 DIAGNOSIS — R002 Palpitations: Secondary | ICD-10-CM

## 2017-05-11 DIAGNOSIS — R42 Dizziness and giddiness: Secondary | ICD-10-CM

## 2017-05-11 NOTE — Progress Notes (Signed)
CARDIOLOGY CONSULT NOTE  Patient ID: [name] MRN: [id number] DOB/AGE: August 16, 1935 81 y.o.  Admit date: (Not on file) Primary Physician: Lemmie Evens, MD Referring Physician: Lemmie Evens, MD  Reason for Consultation: Chest tightness and dizziness  HPI: [name] is a 81 y.o. female who is being seen today for the evaluation of chest tightness and dizziness at the request of Lemmie Evens, MD.   I reviewed all relevant documentation, labs, and studies from her PCP.  Past medical history includes breast cancer, chronic lymphocytic leukemia, and COPD.  She saw her PCP on 05/09/17 and complained of an episode of dizziness with a "weird feeling in her chest ".  She lie down for 90 minutes after which the dizziness and chest discomfort resolved.  On the morning of her appointment, she had no energy and felt nauseated but the symptoms eventually resolved.  She thinks the nausea was due to acid reflux because she took an antacid and symptoms resolved.  She has had dizzy spells since 2014 and was tried on meclizine but it only made her symptoms worse.  Apparently these spells primarily occur when going from the sitting to standing position.  Relevant labs from August 2018: Total cholesterol 148, HDL 33, triglycerides 98, LDL 96, A1c 5.9%, TSH 3.46, white blood cell count 53,900, sodium 142, potassium 4.5, BUN 18, creatinine 0.86, platelets 250, hemoglobin 13.7.  A rhythm strip reportedly demonstrated PACs and PVCs.  Personally reviewed ECG performed on 05/09/17 which demonstrated sinus rhythm with nonspecific T wave abnormalities.  She denies exertional chest pain and shortness of breath.  She said she had a sinus infection back in 2000 07/2002.  She saw an ENT specialist in [place] and was told she had some left sided hearing loss.  When she is sitting and watching TV, she has occasional palpitations lasting a few seconds.  She said she has had 3 episodes of  dizziness in the last 3 or 4 months.  Episodic palpitations are not associated with dizziness.  During her most recent spell, she had gone out to eat and then went to [facility].  She was then driving and experienced dizziness with a "funny feeling in her chest ".  She specifically denies chest pain.  She said the entire episode lasted 2 seconds.  As stated above, she lie down for an hour and a half and her symptoms resolved.  She told me about her dog, "Honey Bunny".   Allergies  Allergen Reactions  . Codeine Nausea And Vomiting    headache  . Meclizine Other (See Comments)    Made dizziness worse.    Current Outpatient Medications  Medication Sig Dispense Refill  . albuterol (PROVENTIL HFA;VENTOLIN HFA) 108 (90 BASE) MCG/ACT inhaler Inhale 2 puffs into the lungs every 6 (six) hours as needed for shortness of breath.     Marland Kitchen albuterol (PROVENTIL) (2.5 MG/3ML) 0.083% nebulizer solution Take 2.5 mg by nebulization every 4 (four) hours as needed for wheezing or shortness of breath.    Marland Kitchen aspirin 81 MG tablet Take 81 mg by mouth at bedtime.     . cholecalciferol (VITAMIN D) 1000 UNITS tablet Take 1,000 Units by mouth daily.    Marland Kitchen gabapentin (NEURONTIN) 100 MG capsule Take 100-200 mg by mouth 2 (two) times daily. Take one capsule in AM, 2 capsules at bedtime    . megestrol (MEGACE) 40 MG tablet TAKE ONE TABLET BY MOUTH DAILY AT BEDTIME. 90 tablet 1  .  omeprazole (PRILOSEC) 20 MG capsule Take 20 mg by mouth at bedtime.      No current facility-administered medications for this visit.     Past Medical History:  Diagnosis Date  . Bowel obstruction ([facility])   . Breast cancer ([facility])   . Breast cancer, stage 3 ([facility]) 07/21/2011  . Bronchitis   . CLL (chronic lymphocytic leukemia) ([facility]) 07/21/2011    Past Surgical History:  Procedure Laterality Date  . ABDOMINAL HYSTERECTOMY    . APPENDECTOMY    . CHOLECYSTECTOMY    . LAPAROTOMY N/A 06/08/2015   Procedure: EXPLORATORY LAPAROTOMY;  Surgeon:  Aviva Signs, MD;  Location: [facility];  Service: General;  Laterality: N/A;  . LYSIS OF ADHESION N/A 06/08/2015   Procedure: LYSIS OF ADHESION;  Surgeon: Aviva Signs, MD;  Location: [facility];  Service: General;  Laterality: N/A;  . MASTECTOMY     right  . TOTAL HIP ARTHROPLASTY     right    Social History   Socioeconomic History  . Marital status: Widowed    Spouse name: Not on file  . Number of children: Not on file  . Years of education: 7  . Highest education level: Not on file  Social Needs  . Financial resource strain: Not on file  . Food insecurity - worry: Not on file  . Food insecurity - inability: Not on file  . Transportation needs - medical: Not on file  . Transportation needs - non-medical: Not on file  Occupational History  . Not on file  Tobacco Use  . Smoking status: Never Smoker  . Smokeless tobacco: Never Used  Substance and Sexual Activity  . Alcohol use: No  . Drug use: No  . Sexual activity: Not Currently  Other Topics Concern  . Not on file  Social History Narrative  . Not on file     No family history of premature CAD in 1st degree relatives.  Current Meds  Medication Sig  . albuterol (PROVENTIL HFA;VENTOLIN HFA) 108 (90 BASE) MCG/ACT inhaler Inhale 2 puffs into the lungs every 6 (six) hours as needed for shortness of breath.   Marland Kitchen albuterol (PROVENTIL) (2.5 MG/3ML) 0.083% nebulizer solution Take 2.5 mg by nebulization every 4 (four) hours as needed for wheezing or shortness of breath.  Marland Kitchen aspirin 81 MG tablet Take 81 mg by mouth at bedtime.   . cholecalciferol (VITAMIN D) 1000 UNITS tablet Take 1,000 Units by mouth daily.  Marland Kitchen gabapentin (NEURONTIN) 100 MG capsule Take 100-200 mg by mouth 2 (two) times daily. Take one capsule in AM, 2 capsules at bedtime  . megestrol (MEGACE) 40 MG tablet TAKE ONE TABLET BY MOUTH DAILY AT BEDTIME.  Marland Kitchen omeprazole (PRILOSEC) 20 MG capsule Take 20 mg by mouth at bedtime.       Review of systems complete and found to be  negative unless listed above in HPI    Physical exam Blood pressure 113/75, pulse 88, height 5\' 2"  (1.575 m), weight 186 lb (84.4 kg), SpO2 98 %. General: NAD Neck: No JVD, no thyromegaly or thyroid nodule.  Lungs: Clear to auscultation bilaterally with normal respiratory effort. CV: Nondisplaced PMI. Regular rate and rhythm with premature contractions appreciated, normal S1/S2, no S3/S4, no murmur.  No peripheral edema.     Abdomen: Soft, nontender, no distention.  Skin: Intact without lesions or rashes.  Neurologic: Alert and oriented x 3.  Psych: Normal affect. Extremities: No clubbing or cyanosis.  HEENT: Normal.   ECG: Most recent ECG reviewed.  Labs: Lab Results  Component Value Date/Time   K 4.1 02/02/2017 10:09 AM   K 4.0 10/12/2012 10:38 AM   BUN 18 02/02/2017 10:09 AM   BUN 16.1 10/12/2012 10:38 AM   CREATININE 0.93 02/02/2017 10:09 AM   CREATININE 0.9 10/12/2012 10:38 AM   ALT 23 02/02/2017 10:09 AM   ALT 20 10/12/2012 10:38 AM   TSH 2.865 09/14/2007 10:39 PM   HGB 13.6 02/02/2017 10:09 AM   HGB 13.5 10/12/2012 10:38 AM     Lipids: Lab Results  Component Value Date/Time   LDLCALC 116 (H) 12/24/2015 01:10 PM   CHOL 173 12/24/2015 01:10 PM   TRIG 136 12/24/2015 01:10 PM   HDL 30 (L) 12/24/2015 01:10 PM        ASSESSMENT AND PLAN:  1.  Dizziness and palpitations: I do not think that her dizziness is truly related to a cardiac phenomenon/arrhythmia.  She specifically denies any association with palpitations which are infrequent and dizziness.  She did have PACs and PVCs on a rhythm strip at her PCPs office.  I did appreciate premature contractions during my physical exam.  I will obtain a 1 week event monitor to rule out any significant arrhythmias.  Given the infrequency of symptoms, I do not suspect I would administer an AV nodal blocking agent.  I do recommend that she be evaluated by an ENT specialist as her symptoms may be related to an inner ear  phenomenon.  She denies chest pain and exertional dyspnea and I do not feel stress testing is indicated at this time.     Disposition: Follow up in 2 months  Signed: Kate Sable, M.D., F.A.C.C.  05/11/2017, 11:20 AM

## 2017-05-11 NOTE — Patient Instructions (Signed)
Medication Instructions:  Continue all current medications.  Labwork: none  Testing/Procedures:  Your physician has recommended that you wear a 1 week event monitor. Event monitors are medical devices that record the heart's electrical activity. Doctors most often [place] these monitors to diagnose arrhythmias. Arrhythmias are problems with the speed or rhythm of the heartbeat. The monitor is a small, portable device. You can wear one while you do your normal daily activities. This is usually used to diagnose what is causing palpitations/syncope (passing out).  Office will contact with results via phone or letter.    Follow-Up: 2 months   Any Other Special Instructions Will Be Listed Below (If Applicable).  If you need a refill on your cardiac medications before your next appointment, please call your pharmacy.

## 2017-05-25 ENCOUNTER — Ambulatory Visit (INDEPENDENT_AMBULATORY_CARE_PROVIDER_SITE_OTHER): Payer: Medicare HMO

## 2017-05-25 DIAGNOSIS — R002 Palpitations: Secondary | ICD-10-CM

## 2017-05-25 DIAGNOSIS — R42 Dizziness and giddiness: Secondary | ICD-10-CM | POA: Diagnosis not present

## 2017-06-07 ENCOUNTER — Telehealth: Payer: Self-pay | Admitting: *Deleted

## 2017-06-07 NOTE — Telephone Encounter (Signed)
Notes recorded by Laurine Blazer, LPN on 9/75/8832 at 5:49 PM EST Patient notified. Copy to pmd. Follow up scheduled for 07/12/2017 with Dr. Bronson Ing.   ------  Notes recorded by Laurine Blazer, LPN on 01/15/4157 at 3:09 PM EST Wireless customer not available. ------  Notes recorded by Herminio Commons, MD on 06/02/2017 at 4:44 PM EST Predominantly sinus rhythm. There were occasional extra beats with correlated with her symptoms of fatigue.

## 2017-07-11 ENCOUNTER — Encounter: Payer: Self-pay | Admitting: *Deleted

## 2017-07-12 ENCOUNTER — Encounter: Payer: Self-pay | Admitting: *Deleted

## 2017-07-12 ENCOUNTER — Ambulatory Visit: Payer: Medicare HMO | Admitting: Cardiovascular Disease

## 2017-07-12 NOTE — Progress Notes (Deleted)
SUBJECTIVE: The patient presents for follow-up after undergoing cardiac testing for evaluation of palpitations.  Event monitoring demonstrated sinus rhythm with occasional PVCs but no sustained arrhythmias.  Symptoms of fatigue correlated with PVCs.      Review of Systems: As per "subjective", otherwise negative.  Allergies  Allergen Reactions  . Codeine Nausea And Vomiting    headache  . Meclizine Other (See Comments)    Made dizziness worse.    Current Outpatient Medications  Medication Sig Dispense Refill  . albuterol (PROVENTIL HFA;VENTOLIN HFA) 108 (90 BASE) MCG/ACT inhaler Inhale 2 puffs into the lungs every 6 (six) hours as needed for shortness of breath.     Marland Kitchen albuterol (PROVENTIL) (2.5 MG/3ML) 0.083% nebulizer solution Take 2.5 mg by nebulization every 4 (four) hours as needed for wheezing or shortness of breath.    Marland Kitchen aspirin 81 MG tablet Take 81 mg by mouth at bedtime.     . cholecalciferol (VITAMIN D) 1000 UNITS tablet Take 1,000 Units by mouth daily.    Marland Kitchen gabapentin (NEURONTIN) 100 MG capsule Take 100-200 mg by mouth 2 (two) times daily. Take one capsule in AM, 2 capsules at bedtime    . megestrol (MEGACE) 40 MG tablet TAKE ONE TABLET BY MOUTH DAILY AT BEDTIME. 90 tablet 1  . omeprazole (PRILOSEC) 20 MG capsule Take 20 mg by mouth at bedtime.      No current facility-administered medications for this visit.     Past Medical History:  Diagnosis Date  . Bowel obstruction ([place])   . Breast cancer ([place])   . Breast cancer, stage 3 ([place]) 07/21/2011  . Bronchitis   . CLL (chronic lymphocytic leukemia) ([facility]) 07/21/2011    Past Surgical History:  Procedure Laterality Date  . ABDOMINAL HYSTERECTOMY    . APPENDECTOMY    . CHOLECYSTECTOMY    . LAPAROTOMY N/A 06/08/2015   Procedure: EXPLORATORY LAPAROTOMY;  Surgeon: Aviva Signs, MD;  Location: [facility];  Service: General;  Laterality: N/A;  . LYSIS OF ADHESION N/A 06/08/2015   Procedure: LYSIS OF ADHESION;  Surgeon:  Aviva Signs, MD;  Location: [facility];  Service: General;  Laterality: N/A;  . MASTECTOMY     right  . TOTAL HIP ARTHROPLASTY     right    Social History   Socioeconomic History  . Marital status: Widowed    Spouse name: Not on file  . Number of children: Not on file  . Years of education: 57  . Highest education level: Not on file  Social Needs  . Financial resource strain: Not on file  . Food insecurity - worry: Not on file  . Food insecurity - inability: Not on file  . Transportation needs - medical: Not on file  . Transportation needs - non-medical: Not on file  Occupational History  . Not on file  Tobacco Use  . Smoking status: Never Smoker  . Smokeless tobacco: Never Used  Substance and Sexual Activity  . Alcohol use: No  . Drug use: No  . Sexual activity: Not Currently  Other Topics Concern  . Not on file  Social History Narrative  . Not on file     There were no vitals filed for this visit.  Wt Readings from Last 3 Encounters:  05/11/17 186 lb (84.4 kg)  02/02/17 190 lb 11.2 oz (86.5 kg)  08/03/16 185 lb 6.4 oz (84.1 kg)     PHYSICAL EXAM General: NAD HEENT: Normal. Neck: No JVD, no thyromegaly. Lungs:  Clear to auscultation bilaterally with normal respiratory effort. CV: Regular rate and rhythm, normal S1/S2, no S3/S4, no murmur. No pretibial or periankle edema.  No carotid bruit.   Abdomen: Soft, nontender, no distention.  Neurologic: Alert and oriented.  Psych: Normal affect. Skin: Normal. Musculoskeletal: No gross deformities.    ECG: Most recent ECG reviewed.   Labs: Lab Results  Component Value Date/Time   K 4.1 02/02/2017 10:09 AM   K 4.0 10/12/2012 10:38 AM   BUN 18 02/02/2017 10:09 AM   BUN 16.1 10/12/2012 10:38 AM   CREATININE 0.93 02/02/2017 10:09 AM   CREATININE 0.9 10/12/2012 10:38 AM   ALT 23 02/02/2017 10:09 AM   ALT 20 10/12/2012 10:38 AM   TSH 2.865 09/14/2007 10:39 PM   HGB 13.6 02/02/2017 10:09 AM   HGB 13.5  10/12/2012 10:38 AM     Lipids: Lab Results  Component Value Date/Time   LDLCALC 116 (H) 12/24/2015 01:10 PM   CHOL 173 12/24/2015 01:10 PM   TRIG 136 12/24/2015 01:10 PM   HDL 30 (L) 12/24/2015 01:10 PM       ASSESSMENT AND PLAN: 1.  Palpitations: Event monitor reviewed above with predominantly sinus rhythm and occasional PVCs.  There were no sustained arrhythmias.    Disposition: Follow up ***   Kate Sable, M.D., F.A.C.C.

## 2017-07-13 ENCOUNTER — Encounter: Payer: Self-pay | Admitting: Cardiovascular Disease

## 2017-07-25 ENCOUNTER — Other Ambulatory Visit (HOSPITAL_COMMUNITY): Payer: Self-pay

## 2017-07-25 DIAGNOSIS — C9110 Chronic lymphocytic leukemia of B-cell type not having achieved remission: Secondary | ICD-10-CM

## 2017-07-26 ENCOUNTER — Telehealth (HOSPITAL_COMMUNITY): Payer: Self-pay

## 2017-07-26 ENCOUNTER — Inpatient Hospital Stay (HOSPITAL_COMMUNITY): Payer: Medicare HMO | Attending: Internal Medicine

## 2017-07-26 DIAGNOSIS — R0789 Other chest pain: Secondary | ICD-10-CM | POA: Diagnosis not present

## 2017-07-26 DIAGNOSIS — R5383 Other fatigue: Secondary | ICD-10-CM | POA: Insufficient documentation

## 2017-07-26 DIAGNOSIS — M79621 Pain in right upper arm: Secondary | ICD-10-CM | POA: Diagnosis not present

## 2017-07-26 DIAGNOSIS — R531 Weakness: Secondary | ICD-10-CM | POA: Insufficient documentation

## 2017-07-26 DIAGNOSIS — Z853 Personal history of malignant neoplasm of breast: Secondary | ICD-10-CM | POA: Diagnosis not present

## 2017-07-26 DIAGNOSIS — N951 Menopausal and female climacteric states: Secondary | ICD-10-CM | POA: Diagnosis not present

## 2017-07-26 DIAGNOSIS — C9110 Chronic lymphocytic leukemia of B-cell type not having achieved remission: Secondary | ICD-10-CM | POA: Insufficient documentation

## 2017-07-26 DIAGNOSIS — R0600 Dyspnea, unspecified: Secondary | ICD-10-CM | POA: Insufficient documentation

## 2017-07-26 LAB — COMPREHENSIVE METABOLIC PANEL
ALT: 17 U/L (ref 14–54)
AST: 31 U/L (ref 15–41)
Albumin: 4.1 g/dL (ref 3.5–5.0)
Alkaline Phosphatase: 49 U/L (ref 38–126)
Anion gap: 12 (ref 5–15)
BUN: 19 mg/dL (ref 6–20)
CHLORIDE: 101 mmol/L (ref 101–111)
CO2: 22 mmol/L (ref 22–32)
Calcium: 8.9 mg/dL (ref 8.9–10.3)
Creatinine, Ser: 0.75 mg/dL (ref 0.44–1.00)
GFR calc Af Amer: >60 mL/min (ref >60)
GFR calc non Af Amer: >60 mL/min (ref >60)
Glucose, Bld: 114 mg/dL — ABNORMAL HIGH (ref 65–99)
POTASSIUM: 4.4 mmol/L (ref 3.5–5.1)
Sodium: 135 mmol/L (ref 135–145)
Total Bilirubin: 1.2 mg/dL (ref 0.3–1.2)
Total Protein: 7.3 g/dL (ref 6.5–8.1)

## 2017-07-26 LAB — CBC WITH DIFFERENTIAL/PLATELET
Basophils Absolute: 0 10*3/uL (ref 0.0–0.1)
Basophils Relative: 0 %
Eosinophils Absolute: 0.6 10*3/uL (ref 0.0–0.7)
Eosinophils Relative: 1 %
HCT: 42.4 % (ref 36.0–46.0)
Hemoglobin: 13.4 g/dL (ref 12.0–15.0)
LYMPHS ABS: 48.9 10*3/uL — AB (ref 0.7–4.0)
Lymphocytes Relative: 88 %
MCH: 29.9 pg (ref 26.0–34.0)
MCHC: 31.6 g/dL (ref 30.0–36.0)
MCV: 94.6 fL (ref 78.0–100.0)
MONO ABS: 1.1 10*3/uL — AB (ref 0.1–1.0)
MONOS PCT: 2 %
NEUTROS PCT: 9 %
Neutro Abs: 5 10*3/uL (ref 1.7–7.7)
PLATELETS: 268 10*3/uL (ref 150–400)
RBC: 4.48 MIL/uL (ref 3.87–5.11)
RDW: 15.4 % (ref 11.5–15.5)
WBC: 55.6 10*3/uL — AB (ref 4.0–10.5)

## 2017-07-26 NOTE — Telephone Encounter (Signed)
CRITICAL VALUE STICKER  CRITICAL VALUE: WBC [phone]  RECEIVER (on-site recipient of call): Charlyne Petrin, Rn  [place] NOTIFIED: [phone]  MESSENGER (representative from lab): Hllary, Lab Tech  MD NOTIFIED: Dr. Walden Field  TIME OF NOTIFICATION:1400  RESPONSE: Patient scheduled for follow up on August 02, 2017.

## 2017-07-26 NOTE — Progress Notes (Addendum)
I spoke with Dr. Walden Field regarding patient's lab work from today.  Patient has diagnosis of CLL and her labs are consistent with her diagnosis.  No new orders received.  Patient will keep follow up appointment next week.

## 2017-08-02 ENCOUNTER — Inpatient Hospital Stay (HOSPITAL_BASED_OUTPATIENT_CLINIC_OR_DEPARTMENT_OTHER): Payer: Medicare HMO | Admitting: Adult Health

## 2017-08-02 ENCOUNTER — Other Ambulatory Visit: Payer: Self-pay

## 2017-08-02 ENCOUNTER — Telehealth: Payer: Self-pay | Admitting: Cardiovascular Disease

## 2017-08-02 ENCOUNTER — Encounter (HOSPITAL_COMMUNITY): Payer: Self-pay | Admitting: Adult Health

## 2017-08-02 VITALS — BP 109/71 | HR 84 | Temp 99.1°F | Resp 22 | Wt 184.4 lb

## 2017-08-02 DIAGNOSIS — M79621 Pain in right upper arm: Secondary | ICD-10-CM | POA: Diagnosis not present

## 2017-08-02 DIAGNOSIS — R0789 Other chest pain: Secondary | ICD-10-CM

## 2017-08-02 DIAGNOSIS — R0600 Dyspnea, unspecified: Secondary | ICD-10-CM | POA: Diagnosis not present

## 2017-08-02 DIAGNOSIS — Z853 Personal history of malignant neoplasm of breast: Secondary | ICD-10-CM | POA: Diagnosis not present

## 2017-08-02 DIAGNOSIS — R531 Weakness: Secondary | ICD-10-CM

## 2017-08-02 DIAGNOSIS — C9110 Chronic lymphocytic leukemia of B-cell type not having achieved remission: Secondary | ICD-10-CM

## 2017-08-02 DIAGNOSIS — N951 Menopausal and female climacteric states: Secondary | ICD-10-CM

## 2017-08-02 DIAGNOSIS — R5383 Other fatigue: Secondary | ICD-10-CM | POA: Diagnosis not present

## 2017-08-02 DIAGNOSIS — Z1231 Encounter for screening mammogram for malignant neoplasm of breast: Secondary | ICD-10-CM

## 2017-08-02 DIAGNOSIS — C50911 Malignant neoplasm of unspecified site of right female breast: Secondary | ICD-10-CM

## 2017-08-02 NOTE — Telephone Encounter (Signed)
Please call pt. With results of event monitor.

## 2017-08-02 NOTE — Progress Notes (Signed)
[name], [place] [address]   CLINIC:  Medical Oncology/Hematology  PCP:  Lemmie Evens, MD [place] [address] [phone]   REASON FOR VISIT:  Follow-up for CLL AND Stage III right breast cancer   CURRENT THERAPY: Observation AND Megace po daily (since 1999)     HISTORY OF PRESENT ILLNESS:  (From Dr. Laverle Patter note on 02/02/17)      INTERVAL HISTORY:  [name] 82 y.o. female returns for routine follow-up for CLL and right breast cancer.   Here today unaccompanied.   Overall, she tells me she has been feeling "pretty good."  Appetite 75%; energy levels 50%.  Denies any pain today.    She feels tired/weak at times. Endorses dyspnea on exertion, which has been chronic and is largely unchanged. She ambulates with a walker.  She has occasional "dizzy spells", which is also chronic for her. She has been struggling with low blood sugars in recent months. She tells me about an episode at [employer] where she had to sit down and staff at the store had to bring her orange juice.  Her symptoms at that time improved with the juice.    Denies any changes to her chest wall or her (L) breast. She continues to have chronic, intermittent (R) chest wall/axilla "stabbing" pain.  She uses OTC "SalonPas" for this, which is very helpful.  She also has occasional (L) breast pain; she does not wear a bra. She is requesting a refill for (R) breast prothesis, so she can wear her mastectomy bras again; states that she does not need the bra, but does need the actual prosthesis (paper prescription written and I will ask nursing to fax Rx to [facility] per patient wishes).  She has decreased ROM to her (R) shoulder; states that she tries to do different exercises at home to help with this.  Certain positions make her axillary/chest wall pain worse.    Denies any recent fevers/chills or infections. She has hot flashes and  night sweats (which she attributes to her hot flashes). Night sweats are not drenching.  "I just get hot and take the covers off and put a cold wash cloth on myself to cool me down sometimes."  No adenopathy that she is aware of.  She feels like her weight has been stable. Chart reviewed; appears she has lost ~6 lbs in the past ~6 months.    Otherwise, she is largely without other complaints today. "I feel good. Today is a good day."     REVIEW OF SYSTEMS:  Review of Systems - Oncology Per HPI. Otherwise 14-point ROS completed and negative except as stated above.    PAST MEDICAL/SURGICAL HISTORY:  Past Medical History:  Diagnosis Date  . Bowel obstruction ([place])   . Breast cancer ([place])   . Breast cancer, stage 3 ([place]) 07/21/2011  . Bronchitis   . CLL (chronic lymphocytic leukemia) ([place]) 07/21/2011   Past Surgical History:  Procedure Laterality Date  . ABDOMINAL HYSTERECTOMY    . APPENDECTOMY    . CHOLECYSTECTOMY    . LAPAROTOMY N/A 06/08/2015   Procedure: EXPLORATORY LAPAROTOMY;  Surgeon: Aviva Signs, MD;  Location: [facility];  Service: General;  Laterality: N/A;  . LYSIS OF ADHESION N/A 06/08/2015   Procedure: LYSIS OF ADHESION;  Surgeon: Aviva Signs, MD;  Location: [facility];  Service: General;  Laterality: N/A;  . MASTECTOMY     right  . TOTAL  HIP ARTHROPLASTY     right     SOCIAL HISTORY:  Social History   Socioeconomic History  . Marital status: Widowed    Spouse name: Not on file  . Number of children: Not on file  . Years of education: 70  . Highest education level: Not on file  Social Needs  . Financial resource strain: Not on file  . Food insecurity - worry: Not on file  . Food insecurity - inability: Not on file  . Transportation needs - medical: Not on file  . Transportation needs - non-medical: Not on file  Occupational History  . Not on file  Tobacco Use  . Smoking status: Never Smoker  . Smokeless tobacco: Never Used  Substance and Sexual Activity  .  Alcohol use: No  . Drug use: No  . Sexual activity: Not Currently  Other Topics Concern  . Not on file  Social History Narrative  . Not on file    FAMILY HISTORY:  Family History  Problem Relation Age of Onset  . Cancer Brother   . Cancer Unknown     CURRENT MEDICATIONS:  Outpatient Encounter Medications as of 08/02/2017  Medication Sig  . albuterol (PROVENTIL HFA;VENTOLIN HFA) 108 (90 BASE) MCG/ACT inhaler Inhale 2 puffs into the lungs every 6 (six) hours as needed for shortness of breath.   Marland Kitchen albuterol (PROVENTIL) (2.5 MG/3ML) 0.083% nebulizer solution Take 2.5 mg by nebulization every 4 (four) hours as needed for wheezing or shortness of breath.  Marland Kitchen aspirin 81 MG tablet Take 81 mg by mouth at bedtime.   . cholecalciferol (VITAMIN D) 1000 UNITS tablet Take 1,000 Units by mouth daily.  Marland Kitchen gabapentin (NEURONTIN) 100 MG capsule Take 100-200 mg by mouth 2 (two) times daily. Take one capsule in AM, 2 capsules at bedtime  . omeprazole (PRILOSEC) 20 MG capsule Take 20 mg by mouth at bedtime.   . [DISCONTINUED] megestrol (MEGACE) 40 MG tablet TAKE ONE TABLET BY MOUTH DAILY AT BEDTIME.   No facility-administered encounter medications on file as of 08/02/2017.     ALLERGIES:  Allergies  Allergen Reactions  . Codeine Nausea And Vomiting    headache  . Meclizine Other (See Comments)    Made dizziness worse.     PHYSICAL EXAM:  ECOG Performance status: 1-2 - Symptomatic; requires occasional assistance  Vitals:   08/02/17 1343  BP: 109/71  Pulse: 84  Resp: (!) 22  Temp: 99.1 F (37.3 C)  SpO2: 97%   Filed Weights   08/02/17 1343  Weight: 184 lb 6.4 oz (83.6 kg)    Physical Exam  Constitutional: She is oriented to person, place, and time and well-developed, well-nourished, and in no distress.  Required assistance to get onto exam table   HENT:  Head: Normocephalic.  Mouth/Throat: Oropharynx is clear and moist. No oropharyngeal exudate.  Eyes: Conjunctivae are normal.  Pupils are equal, round, and reactive to light. No scleral icterus.  Neck: Normal range of motion. Neck supple.  Cardiovascular: Normal rate and regular rhythm.  Pulmonary/Chest: Effort normal and breath sounds normal. No respiratory distress.    Abdominal: Soft. Bowel sounds are normal. There is no tenderness.  Musculoskeletal: She exhibits edema (Trace ankle edema bilat ).  Decreased (R) shoulder ROM.   Lymphadenopathy:    She has no cervical adenopathy.       Right: No supraclavicular adenopathy present.       Left: No supraclavicular adenopathy present.  Neurological: She is alert and oriented to  person, place, and time. No cranial nerve deficit.  Ambulates with cane   Skin: Skin is warm and dry. No rash noted.  Psychiatric: Mood, memory, affect and judgment normal.  Nursing note and vitals reviewed.    LABORATORY DATA:  I have reviewed the labs as listed.  CBC    Component Value Date/Time   WBC 55.6 (HH) 07/26/2017 1320   RBC 4.48 07/26/2017 1320   HGB 13.4 07/26/2017 1320   HGB 13.5 10/12/2012 1038   HCT 42.4 07/26/2017 1320   HCT 42.7 10/12/2012 1038   PLT 268 07/26/2017 1320   PLT 241 10/12/2012 1038   MCV 94.6 07/26/2017 1320   MCV 88.9 10/12/2012 1038   MCH 29.9 07/26/2017 1320   MCHC 31.6 07/26/2017 1320   RDW 15.4 07/26/2017 1320   RDW 14.4 10/12/2012 1038   LYMPHSABS 48.9 (H) 07/26/2017 1320   LYMPHSABS 57.9 (H) 10/12/2012 1038   MONOABS 1.1 (H) 07/26/2017 1320   MONOABS 1.2 (H) 10/12/2012 1038   EOSABS 0.6 07/26/2017 1320   EOSABS 0.4 10/12/2012 1038   BASOSABS 0.0 07/26/2017 1320   BASOSABS 0.3 (H) 10/12/2012 1038   CMP Latest Ref Rng & Units 07/26/2017 02/02/2017 08/03/2016  Glucose 65 - 99 mg/dL 114(H) 151(H) 107(H)  BUN 6 - 20 mg/dL 19 18 20   Creatinine 0.44 - 1.00 mg/dL 0.75 0.93 0.90  Sodium 135 - 145 mmol/L 135 139 135  Potassium 3.5 - 5.1 mmol/L 4.4 4.1 4.0  Chloride 101 - 111 mmol/L 101 109 105  CO2 22 - 32 mmol/L 22 20(L) 22  Calcium 8.9 -  10.3 mg/dL 8.9 9.2 9.0  Total Protein 6.5 - 8.1 g/dL 7.3 6.8 7.1  Total Bilirubin 0.3 - 1.2 mg/dL 1.2 0.5 0.8  Alkaline Phos 38 - 126 U/L 49 49 48  AST 15 - 41 U/L 31 24 24   ALT 14 - 54 U/L 17 23 19     PENDING LABS:    DIAGNOSTIC IMAGING:  *The following radiologic images and reports have been reviewed independently and agree with below findings.  (L) unilateral screening mammogram: 03/20/17 CLINICAL DATA:  Screening.  EXAM: 2D DIGITAL SCREENING UNILATERAL LEFT MAMMOGRAM WITH CAD AND ADJUNCT TOMO  COMPARISON:  Previous exam(s).  ACR Breast Density Category b: There are scattered areas of fibroglandular density.  FINDINGS: There are no findings suspicious for malignancy. Images were processed with CAD.  IMPRESSION: No mammographic evidence of malignancy. A result letter of this screening mammogram will be mailed directly to the patient.  RECOMMENDATION: Screening mammogram in one year. (Code:[id number]-B-01Y)  BI-RADS CATEGORY  1: Negative.   Electronically Signed   By: Curlene Dolphin M.D.   On: 03/20/2017 16:51   PATHOLOGY:          ASSESSMENT & PLAN:   CLL:  -Diagnosed 25+ years ago. Has never required treatment.  -Labs reviewed in detail with patient today. WBCs 55.6 today. Hgb and platelets stable. She has some fatigue, but it is not severe. She has night sweats, which I suspect are more hot flashes; they are not drenching and are stable. Very subtle weight loss, but not severe. Denies any fevers, abnormal bruising, or bleeding episodes.  No adenopathy appreciated on physical exam.  -CLL remains stable. No role for treatment at this time.  -Return to cancer center in 02/2018 (after mammogram) for follow-up with labs.   NCCN guidelines for indication for treatment of CLL are:  A. Eligible for clinical trial  B. Significant disease-related symptoms   1.  Fatigue (severe)   2. Night sweats   3. Weight loss   4. Fever without infection  C. Threatened  end-organ function  D. Progressive bulky disease (spleen >6cm below costal margin, lymph nodes >10 cm)  E. Progressive anemia  F. Progressive thrombocytopenia.    Stage III right breast cancer: -Diagnosed in 1998/1999. Treated with neoadjuvant chemo, followed by (R) mastectomy, then XRT.  Started adjuvant Megace in 1999, which she has continued since that time.   -Remains on Megace and she has been taking this medication for nearly 20 years.  Megace has been studied for use in the metastatic setting for palliative treatment, but there is less data with locally-advanced breast cancer. Unclear if she ever took an aromatase inhibitor in the past, although there would not be a role for AI at this time without evidence of recurrent disease.   Megace  increases risk of VTE and cushing-like syndromes.  There is likely little clinical benefit for continued breast cancer risk reduction at this point.  She has significant hot flashes and associated night sweats, in which I suspect Megace is playing a role. Therefore, given patient's age and that she has been on this medication for 20 years, I recommend stopping the Megace at this time.  She agrees with this plan.  -Last unilateral (L) breast screening mammogram 03/20/17 negative for malignancy. Annual mammogram due in 02/2018; orders placed today.  -Clinical breast exam performed today and negative.  -Return to cancer center in 02/2018 after mammogram for follow-up with labs.    Decreased (R) shoulder ROM and associated (R) chest wall/axillary pain:  -No evidence of recurrent breast cancer on exam.  Offered her referral to PT/OT for both the pain and decreased ROM; she declines at this time, stating that she has worked with therapists in the past "and I know what to do at home already."  She does have associated fibrosis and possible adhesions from her mastectomy that may also be contributing to her intermittent chest wall/axilla pain and ROM issues.    -Encouraged her to call us and let us know if she changed her mind about seeing PT/OT and we could place that referral for her at any time in the future.         Dispo:  -(L) breast mammogram due in 02/2018; orders placed today.  -Return to cancer center in 03/2018 after mammogram for follow-up with labs.     All questions were answered to patient's stated satisfaction. Encouraged patient to call with any new concerns or questions before her next visit to the cancer center and we can certain see her sooner, if needed.      Orders placed this encounter:  Orders Placed This Encounter  Procedures  . MM SCREENING BREAST TOMO UNI L  . CBC with Differential/Platelet  . Comprehensive metabolic panel  . Lactate dehydrogenase      Mike Craze, NP [place] [phone]

## 2017-08-02 NOTE — Telephone Encounter (Signed)
Patient reminded that she had already been notified in January.  Results given again.   SEE BELOW:    Notes recorded by Laurine Blazer, LPN on 0/31/5945 at 8:59 PM EST Patient notified. Copy to pmd. Follow up scheduled for 07/12/2017 with Dr. Bronson Ing.   ------  Notes recorded by Laurine Blazer, LPN on 2/92/4462 at 8:63 PM EST Wireless customer not available. ------  Notes recorded by Herminio Commons, MD on 06/02/2017 at 4:44 PM EST Predominantly sinus rhythm. There were occasional extra beats with correlated with her symptoms of fatigue.

## 2017-08-02 NOTE — Telephone Encounter (Signed)
Results of event monitor / tg  °

## 2017-08-02 NOTE — Telephone Encounter (Signed)
Stated that she was not able to make it to the last appointment due to weather.  Has rescheduled for May in the [place] office.

## 2017-08-02 NOTE — Patient Instructions (Addendum)
[name] at [facility] Discharge Instructions  You were seen today by Mike Craze, NP  See schedulers up front for appointments  Stop taking your megace.  Mammogram in October 2019 then see [place] early November with lab work.     Thank you for choosing [place] at [facility] to provide your oncology and hematology care.  To afford each patient quality time with our provider, please arrive at least 15 minutes before your scheduled appointment time.   If you have a lab appointment with the [facility] please come in thru the  Main Entrance and check in at the main information desk  You need to re-schedule your appointment should you arrive 10 or more minutes late.  We strive to give you quality time with our providers, and arriving late affects you and other patients whose appointments are after yours.  Also, if you no show three or more times for appointments you may be dismissed from the clinic at the providers discretion.     Again, thank you for choosing [facility].  Our hope is that these requests will decrease the amount of time that you wait before being seen by our physicians.       _____________________________________________________________  Should you have questions after your visit to [facility], please contact our office at [phone] between the hours of 8:30 a.m. and 4:30 p.m.  Voicemails left after 4:30 p.m. will not be returned until the following business day.  For prescription refill requests, have your pharmacy contact our office.       Resources For Cancer Patients and their Caregivers ? American Cancer Society: Can assist with transportation, wigs, general needs, runs Look Good Feel Better.        2180429955 ? Cancer Care: Provides financial assistance, online support groups, medication/co-pay assistance.  1-800-813-HOPE 516-590-3151) ? [facility] Assists  [place] cancer patients and their families through emotional , educational and financial support.  762-523-5932 ? [place] DSS Where to apply for food stamps, Medicaid and utility assistance. 364-649-4848 ? [facility]: Transportation to medical appointments. 605-547-1961 ? Social Security Administration: May apply for disability if have a Stage IV cancer. 620-331-4135 2704473035 ? [employer], Disability and Transit Services: Assists with nutrition, care and transit needs. [facility] Support Programs:   > Cancer Support Group  2nd Tuesday of the month 1pm-2pm, Journey Room   > Creative Journey  3rd Tuesday of the month 1130am-1pm, Journey Room

## 2017-09-08 ENCOUNTER — Emergency Department (HOSPITAL_COMMUNITY): Payer: Medicare HMO

## 2017-09-08 ENCOUNTER — Other Ambulatory Visit: Payer: Self-pay

## 2017-09-08 ENCOUNTER — Encounter (HOSPITAL_COMMUNITY): Payer: Self-pay

## 2017-09-08 ENCOUNTER — Emergency Department (HOSPITAL_COMMUNITY)
Admission: EM | Admit: 2017-09-08 | Discharge: 2017-09-08 | Disposition: A | Discharge status: Home / Self Care | Payer: Medicare HMO | Attending: Emergency Medicine | Admitting: Emergency Medicine

## 2017-09-08 DIAGNOSIS — Z853 Personal history of malignant neoplasm of breast: Secondary | ICD-10-CM | POA: Diagnosis not present

## 2017-09-08 DIAGNOSIS — Z96641 Presence of right artificial hip joint: Secondary | ICD-10-CM | POA: Diagnosis not present

## 2017-09-08 DIAGNOSIS — H6122 Impacted cerumen, left ear: Secondary | ICD-10-CM | POA: Insufficient documentation

## 2017-09-08 DIAGNOSIS — R51 Headache: Secondary | ICD-10-CM | POA: Insufficient documentation

## 2017-09-08 DIAGNOSIS — Z7982 Long term (current) use of aspirin: Secondary | ICD-10-CM | POA: Insufficient documentation

## 2017-09-08 DIAGNOSIS — R42 Dizziness and giddiness: Secondary | ICD-10-CM | POA: Diagnosis present

## 2017-09-08 DIAGNOSIS — Z79899 Other long term (current) drug therapy: Secondary | ICD-10-CM | POA: Diagnosis not present

## 2017-09-08 HISTORY — DX: Migraine, unspecified, not intractable, without status migrainosus: G43.909

## 2017-09-08 MED ORDER — DOCUSATE SODIUM 50 MG/5ML PO LIQD
20.0000 mg | Freq: Once | ORAL | Status: AC
  Administered 2017-09-08: 20 mg via OTIC
  Filled 2017-09-08: qty 10

## 2017-09-08 MED ORDER — HYDROGEN PEROXIDE 3 % EX SOLN
CUTANEOUS | Status: AC
  Administered 2017-09-08
  Filled 2017-09-08: qty 473

## 2017-09-08 NOTE — ED Provider Notes (Signed)
[facility] EMERGENCY DEPARTMENT Provider Note   CSN: [id number] Arrival date & time: 09/08/17  1723     History   Chief Complaint Chief Complaint  Patient presents with  . Dizziness    HPI [name] is a 82 y.o. female.   Dizziness  Quality:  Lightheadedness and head spinning Severity:  Moderate Onset quality:  Sudden Duration:  1 day Timing:  Constant Progression:  Waxing and waning Chronicity:  Recurrent Context comment:  Sometimes with bending over, sometimes at rest Relieved by:  None tried Worsened by:  Nothing Ineffective treatments:  None tried Associated symptoms: headaches   Risk factors: hx of vertigo     Past Medical History:  Diagnosis Date  . Bowel obstruction ([place])   . Breast cancer ([place])   . Breast cancer, stage 3 ([place]) 07/21/2011  . Bronchitis   . CLL (chronic lymphocytic leukemia) ([place]) 07/21/2011  . Migraines     Patient Active Problem List   Diagnosis Date Noted  . SBO (small bowel obstruction) ([place]) 06/05/2015  . Hyperglycemia 06/05/2015  . Mass of arm 11/15/2011  . CLL (chronic lymphocytic leukemia) ([place]) 07/21/2011  . Breast cancer, stage 3 ([place]) 07/21/2011  . ARTHRITIS, RIGHT HIP 09/23/2008  . HYPERLIPIDEMIA 04/25/2008  . OSA (obstructive sleep apnea) 01/16/2008  . FATIGUE 09/14/2007  . ANXIETY 01/08/2007  . DEPRESSION 01/08/2007  . ALLERGIC RHINITIS 01/08/2007  . ARTHRITIS 01/08/2007  . HIP PAIN, RIGHT 01/08/2007  . LOW BACK PAIN 01/08/2007  . OSTEOPENIA 01/08/2007  . MIGRAINES, HX OF 01/08/2007    Past Surgical History:  Procedure Laterality Date  . ABDOMINAL HYSTERECTOMY    . APPENDECTOMY    . CHOLECYSTECTOMY    . LAPAROTOMY N/A 06/08/2015   Procedure: EXPLORATORY LAPAROTOMY;  Surgeon: Aviva Signs, MD;  Location: [facility];  Service: General;  Laterality: N/A;  . LYSIS OF ADHESION N/A 06/08/2015   Procedure: LYSIS OF ADHESION;  Surgeon: Aviva Signs, MD;  Location: [facility];  Service: General;  Laterality: N/A;  .  MASTECTOMY     right  . TOTAL HIP ARTHROPLASTY     right     OB History   None      Home Medications    Prior to Admission medications   Medication Sig Start Date End Date Taking? Authorizing Provider  albuterol (PROVENTIL HFA;VENTOLIN HFA) 108 (90 BASE) MCG/ACT inhaler Inhale 2 puffs into the lungs every 6 (six) hours as needed for shortness of breath.    Yes [provider]  albuterol (PROVENTIL) (2.5 MG/3ML) 0.083% nebulizer solution Take 2.5 mg by nebulization every 4 (four) hours as needed for wheezing or shortness of breath.   Yes [provider]  aspirin 325 MG tablet Take 81 mg by mouth daily.    Yes [provider]  cholecalciferol (VITAMIN D) 1000 UNITS tablet Take 1,000 Units by mouth daily.   Yes [provider]  gabapentin (NEURONTIN) 100 MG capsule Take 100-200 mg by mouth 2 (two) times daily. Take one capsule in AM, 2 capsules at bedtime 12/17/14  Yes [provider]  omeprazole (PRILOSEC) 20 MG capsule Take 20 mg by mouth at bedtime.    Yes [provider]    Family History Family History  Problem Relation Age of Onset  . Cancer Brother   . Cancer Unknown     Social History Social History   Tobacco Use  . Smoking status: Never Smoker  . Smokeless tobacco: Never Used  Substance Use Topics  .  Alcohol use: No  . Drug use: No     Allergies   Codeine and Meclizine   Review of Systems Review of Systems  Neurological: Positive for dizziness and headaches.  All other systems reviewed and are negative.    Physical Exam Updated Vital Signs BP 130/79 (BP Location: Left Arm)   Pulse 90   Temp 98.3 F (36.8 C) (Oral)   Resp 14   SpO2 99%   Physical Exam  Constitutional: She is oriented to person, place, and time. She appears well-developed and well-nourished.  HENT:  Head: Normocephalic and atraumatic.  Initially full of cerumen, after nursing irrigated, no e/o effusion or infection  Eyes:  Pupils are equal, round, and reactive to light. Conjunctivae and EOM are normal.  Neck: Normal range of motion.  Cardiovascular: Normal rate and regular rhythm.  Pulmonary/Chest: Effort normal and breath sounds normal. No stridor. No respiratory distress.  Abdominal: Soft. She exhibits no distension.  Musculoskeletal: Normal range of motion. She exhibits no edema or deformity.  Neurological: She is alert and oriented to person, place, and time.  Skin: Skin is warm and dry.  Nursing note and vitals reviewed.    ED Treatments / Results  Labs (all labs ordered are listed, but only abnormal results are displayed) Labs Reviewed - No data to display  EKG None  Radiology Ct Head Wo Contrast  Result Date: 09/08/2017 CLINICAL DATA:  Per ed note: Pt reports that her left ear has been "stopped up" since Sunday. Pt reports dizzy episodes for years, but since she has been having problem with ear she has recurrent dizziness. Pt says she is having a couple daily and is nauseated EXAM: CT HEAD WITHOUT CONTRAST TECHNIQUE: Contiguous axial images were obtained from the base of the skull through the vertex without intravenous contrast. COMPARISON:  Brain MRI 2176 FINDINGS: Brain: No acute intracranial hemorrhage. No focal mass lesion. No CT evidence of acute infarction. No midline shift or mass effect. No hydrocephalus. Basilar cisterns are patent. There are periventricular and subcortical white matter hypodensities. Generalized cortical atrophy. Vascular: No hyperdense vessel or unexpected calcification. Skull: Normal. Negative for fracture or focal lesion. Sinuses/Orbits: Paranasal sinuses and mastoid air cells are clear. Orbits are clear. Other: Small amount of cerumen within the LEFT and RIGHT external auditory canals. IMPRESSION: 1. No acute intracranial findings. 2. Atrophy white matter microvascular disease. 3. Small amount of cerumen within LEFT and RIGHT external auditory canal. Electronically Signed    By: Suzy Bouchard M.D.   On: 09/08/2017 20:09    Procedures Procedures (including critical care time)  Medications Ordered in ED Medications  hydrogen peroxide 3 % external solution (has no administration in time range)  docusate (COLACE) 50 MG/5ML liquid 20 mg (20 mg Left EAR Given 09/08/17 2122)     Initial Impression / Assessment and Plan / ED Course  I have reviewed the triage vital signs and the nursing notes.  Pertinent labs & imaging results that were available during my care of the patient were reviewed by me and considered in my medical decision making (see chart for details).    H/o malignancy and has had persistent headaches for 2 years, will ct to ensure no new malignancy or sinus disease causing headache. otherwise sounds similar to peripheral vertigo but doesn't have symptoms right now so will hold on treatment.  Improved symptoms here, no obvious cause.   Final Clinical Impressions(s) / ED Diagnoses   Final diagnoses:  Impacted cerumen of left ear  ED Discharge Orders    None       Cassandria Drew, Corene Cornea, MD 09/08/17 2259

## 2017-09-08 NOTE — ED Triage Notes (Signed)
Pt reports that her left ear has been "stopped up" since Sunday. Pt reports dizzy episodes for years, but since she has been having problem with ear she has recurrent dizziness. Pt says she is having a couple daily and is nauseated

## 2017-09-28 ENCOUNTER — Ambulatory Visit: Payer: Medicare HMO | Admitting: Cardiovascular Disease

## 2017-09-28 ENCOUNTER — Encounter: Payer: Self-pay | Admitting: Cardiovascular Disease

## 2017-09-28 VITALS — BP 106/72 | HR 88 | Ht 62.0 in | Wt 183.0 lb

## 2017-09-28 DIAGNOSIS — R0609 Other forms of dyspnea: Secondary | ICD-10-CM

## 2017-09-28 DIAGNOSIS — R42 Dizziness and giddiness: Secondary | ICD-10-CM | POA: Diagnosis not present

## 2017-09-28 DIAGNOSIS — R002 Palpitations: Secondary | ICD-10-CM

## 2017-09-28 DIAGNOSIS — J449 Chronic obstructive pulmonary disease, unspecified: Secondary | ICD-10-CM | POA: Diagnosis not present

## 2017-09-28 NOTE — Progress Notes (Signed)
SUBJECTIVE: The patient returns for past due follow-up after undergoing cardiovascular testing performed for the evaluation of dizziness and palpitations.  Event monitoring demonstrated sinus rhythm with occasional PVCs and no sustained arrhythmias.  Symptoms of fatigue correlated with PVCs.  She was evaluated in the ED for dizziness on 09/08/2017.  I reviewed all relevant documentation, labs, and studies.  Head CT showed no acute intracranial findings.  There was a small amount of cerumen within the left and right external auditory canal.  She denies chest pain.  She is scheduled to see an ENT specialist next week.  She has chronic exertional dyspnea alleviated with albuterol inhalers and nebulizer treatments.   Review of Systems: As per "subjective", otherwise negative.  Allergies  Allergen Reactions  . Codeine Nausea And Vomiting    headache  . Meclizine Other (See Comments)    Made dizziness worse.    Current Outpatient Medications  Medication Sig Dispense Refill  . albuterol (PROVENTIL HFA;VENTOLIN HFA) 108 (90 BASE) MCG/ACT inhaler Inhale 2 puffs into the lungs every 6 (six) hours as needed for shortness of breath.     Marland Kitchen albuterol (PROVENTIL) (2.5 MG/3ML) 0.083% nebulizer solution Take 2.5 mg by nebulization every 4 (four) hours as needed for wheezing or shortness of breath.    Marland Kitchen aspirin 325 MG tablet Take 81 mg by mouth daily.     . cholecalciferol (VITAMIN D) 1000 UNITS tablet Take 1,000 Units by mouth daily.    Marland Kitchen gabapentin (NEURONTIN) 100 MG capsule Take 100-200 mg by mouth 2 (two) times daily. Take one capsule in AM, 2 capsules at bedtime    . omeprazole (PRILOSEC) 20 MG capsule Take 20 mg by mouth at bedtime.      No current facility-administered medications for this visit.     Past Medical History:  Diagnosis Date  . Bowel obstruction ([place])   . Breast cancer ([place])   . Breast cancer, stage 3 ([place]) 07/21/2011  . Bronchitis   . CLL (chronic lymphocytic  leukemia) ([place]) 07/21/2011  . Migraines     Past Surgical History:  Procedure Laterality Date  . ABDOMINAL HYSTERECTOMY    . APPENDECTOMY    . CHOLECYSTECTOMY    . LAPAROTOMY N/A 06/08/2015   Procedure: EXPLORATORY LAPAROTOMY;  Surgeon: Aviva Signs, MD;  Location: [facility];  Service: General;  Laterality: N/A;  . LYSIS OF ADHESION N/A 06/08/2015   Procedure: LYSIS OF ADHESION;  Surgeon: Aviva Signs, MD;  Location: [facility];  Service: General;  Laterality: N/A;  . MASTECTOMY     right  . TOTAL HIP ARTHROPLASTY     right    Social History   Socioeconomic History  . Marital status: Widowed    Spouse name: Not on file  . Number of children: Not on file  . Years of education: 52  . Highest education level: Not on file  Occupational History  . Not on file  Social Needs  . Financial resource strain: Not on file  . Food insecurity:    Worry: Not on file    Inability: Not on file  . Transportation needs:    Medical: Not on file    Non-medical: Not on file  Tobacco Use  . Smoking status: Never Smoker  . Smokeless tobacco: Never Used  Substance and Sexual Activity  . Alcohol use: No  . Drug use: No  . Sexual activity: Not Currently  Lifestyle  . Physical activity:    Days per week: Not on  file    Minutes per session: Not on file  . Stress: Not on file  Relationships  . Social connections:    Talks on phone: Not on file    Gets together: Not on file    Attends religious service: Not on file    Active member of club or organization: Not on file    Attends meetings of clubs or organizations: Not on file    Relationship status: Not on file  . Intimate partner violence:    Fear of current or ex partner: Not on file    Emotionally abused: Not on file    Physically abused: Not on file    Forced sexual activity: Not on file  Other Topics Concern  . Not on file  Social History Narrative  . Not on file     Vitals:   09/28/17 1138  BP: 106/72  Pulse: 88  SpO2: 93%    Weight: 183 lb (83 kg)  Height: 5\' 2"  (1.575 m)    Wt Readings from Last 3 Encounters:  09/28/17 183 lb (83 kg)  08/02/17 184 lb 6.4 oz (83.6 kg)  05/11/17 186 lb (84.4 kg)     PHYSICAL EXAM General: NAD HEENT: Normal. Neck: No JVD, no thyromegaly. Lungs: Clear to auscultation bilaterally with normal respiratory effort. CV: Regular rate and rhythm, normal S1/S2, no S3/S4, no murmur. No pretibial or periankle edema.    Abdomen: Soft, nontender, no distention.  Neurologic: Alert and oriented.  Psych: Normal affect. Skin: Normal. Musculoskeletal: No gross deformities.    ECG: Most recent ECG reviewed.   Labs: Lab Results  Component Value Date/Time   K 4.4 07/26/2017 01:20 PM   K 4.0 10/12/2012 10:38 AM   BUN 19 07/26/2017 01:20 PM   BUN 16.1 10/12/2012 10:38 AM   CREATININE 0.75 07/26/2017 01:20 PM   CREATININE 0.9 10/12/2012 10:38 AM   ALT 17 07/26/2017 01:20 PM   ALT 20 10/12/2012 10:38 AM   TSH 2.865 09/14/2007 10:39 PM   HGB 13.4 07/26/2017 01:20 PM   HGB 13.5 10/12/2012 10:38 AM     Lipids: Lab Results  Component Value Date/Time   LDLCALC 116 (H) 12/24/2015 01:10 PM   CHOL 173 12/24/2015 01:10 PM   TRIG 136 12/24/2015 01:10 PM   HDL 30 (L) 12/24/2015 01:10 PM       ASSESSMENT AND PLAN: 1.  Palpitations/PVCs: Symptomatically stable.  No further testing is indicated.  2.  Exertional dyspnea/COPD: Symptomatically stable.  Symptoms are alleviated with albuterol inhalers and nebulizers.  3.  Dizziness: She is scheduled to see an ENT specialist next week.   Disposition: Follow up as needed   Kate Sable, M.D., F.A.C.C.

## 2017-09-28 NOTE — Patient Instructions (Addendum)
Your physician wants you to follow-up in: as needed  with Dr.Koneswaran      Your physician recommends that you continue on your current medications as directed. Please refer to the Current Medication list given to you today.   No lab work or tests ordered today.      Thank you for choosing [facility] !        

## 2017-10-05 ENCOUNTER — Ambulatory Visit (INDEPENDENT_AMBULATORY_CARE_PROVIDER_SITE_OTHER): Payer: Medicare HMO | Admitting: Otolaryngology

## 2017-10-05 DIAGNOSIS — H903 Sensorineural hearing loss, bilateral: Secondary | ICD-10-CM

## 2017-10-05 DIAGNOSIS — H6121 Impacted cerumen, right ear: Secondary | ICD-10-CM | POA: Diagnosis not present

## 2017-10-05 DIAGNOSIS — H6983 Other specified disorders of Eustachian tube, bilateral: Secondary | ICD-10-CM

## 2017-12-14 ENCOUNTER — Ambulatory Visit (INDEPENDENT_AMBULATORY_CARE_PROVIDER_SITE_OTHER): Payer: Medicare HMO | Admitting: Otolaryngology

## 2017-12-18 ENCOUNTER — Ambulatory Visit (INDEPENDENT_AMBULATORY_CARE_PROVIDER_SITE_OTHER): Payer: Medicare HMO | Admitting: Otolaryngology

## 2018-01-03 ENCOUNTER — Encounter (INDEPENDENT_AMBULATORY_CARE_PROVIDER_SITE_OTHER): Payer: Self-pay | Admitting: Orthopaedic Surgery

## 2018-01-03 ENCOUNTER — Ambulatory Visit (INDEPENDENT_AMBULATORY_CARE_PROVIDER_SITE_OTHER): Payer: Medicare HMO

## 2018-01-03 ENCOUNTER — Ambulatory Visit (INDEPENDENT_AMBULATORY_CARE_PROVIDER_SITE_OTHER): Payer: Medicare HMO | Admitting: Orthopaedic Surgery

## 2018-01-03 VITALS — BP 110/57 | HR 91 | Ht 62.0 in | Wt 183.0 lb

## 2018-01-03 DIAGNOSIS — M25562 Pain in left knee: Secondary | ICD-10-CM

## 2018-01-03 DIAGNOSIS — G8929 Other chronic pain: Secondary | ICD-10-CM

## 2018-01-03 MED ORDER — METHYLPREDNISOLONE ACETATE 40 MG/ML IJ SUSP
80.0000 mg | INTRAMUSCULAR | Status: AC | PRN
  Administered 2018-01-03: 80 mg

## 2018-01-03 MED ORDER — LIDOCAINE HCL 1 % IJ SOLN
2.0000 mL | INTRAMUSCULAR | Status: AC | PRN
  Administered 2018-01-03: 2 mL

## 2018-01-03 MED ORDER — BUPIVACAINE HCL 0.5 % IJ SOLN
2.0000 mL | INTRAMUSCULAR | Status: AC | PRN
  Administered 2018-01-03: 2 mL via INTRA_ARTICULAR

## 2018-01-03 NOTE — Progress Notes (Signed)
Office Visit Note   Patient: [name]           Date of Birth: [birth year]           MRN: [id number] Visit Date: 01/03/2018              Requested by: Lemmie Evens, MD [address], [place] [address] PCP: Lemmie Evens, MD   Assessment & Plan: Visit Diagnoses:  1. Chronic pain of left knee     End-stage osteoarthritis left knee.  Long discussion regarding treatment options.  Presently using over-the-counter medicines i.e. Tylenol and aspirin.  Has been using a brace without much relief.  Have discussed definitive treatment of total knee replacement.  [name] would like to pursue a nonoperative course at this point.  I will inject the knee with cortisone and monitor her response next 4 to 6 weeks.  Consider repeat injections versus Visco supplement patient  Follow-Up Instructions: Return if symptoms worsen or fail to improve.   Orders:  Orders Placed This Encounter  Procedures  . Large Joint Inj: L knee  . XR KNEE 3 VIEW LEFT   No orders of the defined types were placed in this encounter.     Procedures: Large Joint Inj: L knee on 01/03/2018 3:05 PM Indications: pain and diagnostic evaluation Details: 25 G 1.5 in needle, anteromedial approach  Arthrogram: No  Medications: 2 mL lidocaine 1 %; 2 mL bupivacaine 0.5 %; 80 mg methylPREDNISolone acetate 40 MG/ML Procedure, treatment alternatives, risks and benefits explained, specific risks discussed. Consent was given by the patient. Patient was prepped and draped in the usual sterile fashion.       Clinical Data: No additional findings.   Subjective: Chief Complaint  Patient presents with  . New Patient (Initial Visit)    L KNEE PAIN FOR 1 YR NO INJURY AT TIMES GIVES AWAY  [name] is 82 years old and visited the office for evaluation of chronic left knee pain.  She has had trouble for over a year with recurrent episodes of pain and to some extent "swelling".  She is tried Tylenol  and aspirin with some temporary relief.  She even recently purchased a knee support which she felt was not securely helpful.  Does use a cane.  Sometimes she feels that that her knee may give way.  Pain is predominant localized along the medial compartment. Had a prior right total hip replacement by Dr. Telford Nab approximately 8 years ago with an excellent result HPI  Review of Systems  Constitutional: Positive for fatigue.  HENT: Negative for ear pain.   Eyes: Negative for pain.  Respiratory: Positive for cough and shortness of breath.   Cardiovascular: Negative for leg swelling.  Gastrointestinal: Negative for constipation and diarrhea.  Genitourinary: Negative for difficulty urinating.  Musculoskeletal: Positive for back pain. Negative for neck pain.  Skin: Negative for rash.  Allergic/Immunologic: Negative for food allergies.  Neurological: Positive for weakness. Negative for numbness.  Hematological: Does not bruise/bleed easily.  Psychiatric/Behavioral: Positive for sleep disturbance.     Objective: Vital Signs: BP (!) 110/57 (BP Location: Left Arm, Patient Position: Sitting, Cuff Size: Normal)   Pulse 91   Ht 5\' 2"  (1.575 m)   Wt 183 lb (83 kg)   BMI 33.47 kg/m   Physical Exam  Constitutional: She is oriented to person, place, and time. She appears well-developed and well-nourished.  HENT:  Mouth/Throat: Oropharynx is clear and moist.  Eyes: Pupils are equal, round,  and reactive to light. EOM are normal.  Pulmonary/Chest: Effort normal.  Neurological: She is alert and oriented to person, place, and time.  Skin: Skin is warm and dry.  Psychiatric: She has a normal mood and affect. Her behavior is normal.    Ortho Exam week alert and oriented x3.Marland Kitchen  Her stated age.  Uses a cane for ambulation.  Lacked a few degrees to full extension of her left knee with a very small effusion.  Increased varus with weightbearing.  Predominately diffuse medial joint pain.  Minimal patellar  crepitation without pain.  No pain laterally straight leg raise negative.  Painless range of motion of left hip.  Neurovascular exam intact distally  Specialty Comments:  No specialty comments available.  Imaging: Xr Knee 3 View Left  Result Date: 01/03/2018 Films of the knee were obtained in 3 projections standing.  These were compared to films that would been performed previously.  There is significant narrowing of the medial compartment with near bone-on-bone.  There is about 3 degrees of varus.  Diffuse osteopenia.  No ectopic calcification.  Mild degenerative changes the patellofemoral and lateral compartment    PMFS History: Patient Active Problem List   Diagnosis Date Noted  . SBO (small bowel obstruction) ([place]) 06/05/2015  . Hyperglycemia 06/05/2015  . Mass of arm 11/15/2011  . CLL (chronic lymphocytic leukemia) ([place]) 07/21/2011  . Breast cancer, stage 3 ([place]) 07/21/2011  . ARTHRITIS, RIGHT HIP 09/23/2008  . HYPERLIPIDEMIA 04/25/2008  . OSA (obstructive sleep apnea) 01/16/2008  . FATIGUE 09/14/2007  . ANXIETY 01/08/2007  . DEPRESSION 01/08/2007  . ALLERGIC RHINITIS 01/08/2007  . ARTHRITIS 01/08/2007  . HIP PAIN, RIGHT 01/08/2007  . LOW BACK PAIN 01/08/2007  . OSTEOPENIA 01/08/2007  . MIGRAINES, HX OF 01/08/2007   Past Medical History:  Diagnosis Date  . Bowel obstruction ([place])   . Breast cancer ([place])   . Breast cancer, stage 3 ([place]) 07/21/2011  . Bronchitis   . CLL (chronic lymphocytic leukemia) ([place]) 07/21/2011  . Migraines     Family History  Problem Relation Age of Onset  . Cancer Brother   . Cancer Unknown     Past Surgical History:  Procedure Laterality Date  . ABDOMINAL HYSTERECTOMY    . APPENDECTOMY    . CHOLECYSTECTOMY    . LAPAROTOMY N/A 06/08/2015   Procedure: EXPLORATORY LAPAROTOMY;  Surgeon: Aviva Signs, MD;  Location: [facility];  Service: General;  Laterality: N/A;  . LYSIS OF ADHESION N/A 06/08/2015   Procedure: LYSIS OF ADHESION;  Surgeon: Aviva Signs, MD;  Location: [facility];  Service: General;  Laterality: N/A;  . MASTECTOMY     right  . TOTAL HIP ARTHROPLASTY     right   Social History   Occupational History  . Not on file  Tobacco Use  . Smoking status: Never Smoker  . Smokeless tobacco: Never Used  Substance and Sexual Activity  . Alcohol use: No  . Drug use: No  . Sexual activity: Not Currently

## 2018-03-26 ENCOUNTER — Inpatient Hospital Stay (HOSPITAL_COMMUNITY): Payer: Medicare HMO | Attending: Hematology

## 2018-03-26 ENCOUNTER — Ambulatory Visit (HOSPITAL_COMMUNITY)
Admission: RE | Admit: 2018-03-26 | Discharge: 2018-03-26 | Disposition: A | Discharge status: Home / Self Care | Payer: Medicare HMO | Source: Ambulatory Visit | Attending: Adult Health | Admitting: Adult Health

## 2018-03-26 ENCOUNTER — Encounter (HOSPITAL_COMMUNITY): Payer: Self-pay

## 2018-03-26 ENCOUNTER — Telehealth (HOSPITAL_COMMUNITY): Payer: Self-pay

## 2018-03-26 DIAGNOSIS — Z9011 Acquired absence of right breast and nipple: Secondary | ICD-10-CM | POA: Insufficient documentation

## 2018-03-26 DIAGNOSIS — Z79899 Other long term (current) drug therapy: Secondary | ICD-10-CM | POA: Diagnosis not present

## 2018-03-26 DIAGNOSIS — Z7982 Long term (current) use of aspirin: Secondary | ICD-10-CM | POA: Insufficient documentation

## 2018-03-26 DIAGNOSIS — C50911 Malignant neoplasm of unspecified site of right female breast: Secondary | ICD-10-CM | POA: Diagnosis not present

## 2018-03-26 DIAGNOSIS — C9110 Chronic lymphocytic leukemia of B-cell type not having achieved remission: Secondary | ICD-10-CM | POA: Diagnosis not present

## 2018-03-26 DIAGNOSIS — Z9221 Personal history of antineoplastic chemotherapy: Secondary | ICD-10-CM | POA: Diagnosis not present

## 2018-03-26 DIAGNOSIS — M79629 Pain in unspecified upper arm: Secondary | ICD-10-CM | POA: Diagnosis not present

## 2018-03-26 DIAGNOSIS — Z923 Personal history of irradiation: Secondary | ICD-10-CM | POA: Insufficient documentation

## 2018-03-26 DIAGNOSIS — Z1231 Encounter for screening mammogram for malignant neoplasm of breast: Secondary | ICD-10-CM | POA: Diagnosis not present

## 2018-03-26 LAB — COMPREHENSIVE METABOLIC PANEL
ALBUMIN: 4.2 g/dL (ref 3.5–5.0)
ALT: 19 U/L (ref 0–44)
AST: 24 U/L (ref 15–41)
Alkaline Phosphatase: 54 U/L (ref 38–126)
Anion gap: 11 (ref 5–15)
BUN: 25 mg/dL — AB (ref 8–23)
CHLORIDE: 103 mmol/L (ref 98–111)
CO2: 25 mmol/L (ref 22–32)
Calcium: 9.4 mg/dL (ref 8.9–10.3)
Creatinine, Ser: 0.87 mg/dL (ref 0.44–1.00)
GFR calc Af Amer: >60 mL/min (ref >60)
GFR calc non Af Amer: >60 mL/min (ref >60)
GLUCOSE: 158 mg/dL — AB (ref 70–99)
POTASSIUM: 4.7 mmol/L (ref 3.5–5.1)
Sodium: 139 mmol/L (ref 135–145)
Total Bilirubin: 0.5 mg/dL (ref 0.3–1.2)
Total Protein: 7 g/dL (ref 6.5–8.1)

## 2018-03-26 LAB — CBC WITH DIFFERENTIAL/PLATELET
ABS IMMATURE GRANULOCYTES: 0.11 10*3/uL — AB (ref 0.00–0.07)
BASOS ABS: 0.2 10*3/uL — AB (ref 0.0–0.1)
Basophils Relative: 0 %
Eosinophils Absolute: 0.4 10*3/uL (ref 0.0–0.5)
Eosinophils Relative: 1 %
HEMATOCRIT: 44.2 % (ref 36.0–46.0)
Hemoglobin: 13.2 g/dL (ref 12.0–15.0)
IMMATURE GRANULOCYTES: 0 %
LYMPHS ABS: 60.1 10*3/uL — AB (ref 0.7–4.0)
LYMPHS PCT: 87 %
MCH: 28.8 pg (ref 26.0–34.0)
MCHC: 29.9 g/dL — ABNORMAL LOW (ref 30.0–36.0)
MCV: 96.3 fL (ref 80.0–100.0)
MONO ABS: 2.1 10*3/uL — AB (ref 0.1–1.0)
Monocytes Relative: 3 %
NEUTROS ABS: 5.9 10*3/uL (ref 1.7–7.7)
Neutrophils Relative %: 9 %
PLATELETS: 264 10*3/uL (ref 150–400)
RBC: 4.59 MIL/uL (ref 3.87–5.11)
RDW: 15.1 % (ref 11.5–15.5)
WBC: 68.8 10*3/uL — AB (ref 4.0–10.5)
nRBC: 0 % (ref 0.0–0.2)

## 2018-03-26 LAB — LACTATE DEHYDROGENASE: LDH: 170 U/L (ref 98–192)

## 2018-03-26 NOTE — Telephone Encounter (Signed)
CRITICAL VALUE STICKER  CRITICAL VALUE: WBC 68.8  RECEIVER (on-site recipient of call): Odie Edmonds, rn  DATE & TIME NOTIFIED: 1209 on 03/26/2018  MESSENGER (representative from lab): Alwyn Pea  MD NOTIFIED: [place]  TIME OF NOTIFICATION: 1210  RESPONSE:  Keep appointment on March 29, 2018.

## 2018-03-29 ENCOUNTER — Inpatient Hospital Stay (HOSPITAL_COMMUNITY): Payer: Medicare HMO | Admitting: Internal Medicine

## 2018-03-29 ENCOUNTER — Encounter (HOSPITAL_COMMUNITY): Payer: Self-pay | Admitting: Internal Medicine

## 2018-03-29 ENCOUNTER — Ambulatory Visit (HOSPITAL_COMMUNITY): Payer: Medicare HMO | Admitting: Adult Health

## 2018-03-29 VITALS — BP 134/91 | HR 65 | Temp 97.8°F | Resp 18 | Wt 178.0 lb

## 2018-03-29 DIAGNOSIS — M79629 Pain in unspecified upper arm: Secondary | ICD-10-CM

## 2018-03-29 DIAGNOSIS — C9110 Chronic lymphocytic leukemia of B-cell type not having achieved remission: Secondary | ICD-10-CM

## 2018-03-29 DIAGNOSIS — Z923 Personal history of irradiation: Secondary | ICD-10-CM

## 2018-03-29 DIAGNOSIS — C50911 Malignant neoplasm of unspecified site of right female breast: Secondary | ICD-10-CM

## 2018-03-29 DIAGNOSIS — Z9221 Personal history of antineoplastic chemotherapy: Secondary | ICD-10-CM

## 2018-03-29 DIAGNOSIS — Z9011 Acquired absence of right breast and nipple: Secondary | ICD-10-CM | POA: Diagnosis not present

## 2018-03-29 DIAGNOSIS — Z7982 Long term (current) use of aspirin: Secondary | ICD-10-CM

## 2018-03-29 DIAGNOSIS — Z79899 Other long term (current) drug therapy: Secondary | ICD-10-CM

## 2018-03-29 NOTE — Progress Notes (Signed)
Diagnosis CLL (chronic lymphocytic leukemia) ([place]) - Plan: CBC with Differential/Platelet, Comprehensive metabolic panel, Lactate dehydrogenase, CBC with Differential/Platelet, Comprehensive metabolic panel, Lactate dehydrogenase, MM Digital Screening Unilat L  Malignant neoplasm of right breast, stage 3 ([facility]) - Plan: CBC with Differential/Platelet, Comprehensive metabolic panel, Lactate dehydrogenase, CBC with Differential/Platelet, Comprehensive metabolic panel, Lactate dehydrogenase, MM Digital Screening Unilat L  Staging Cancer Staging No matching staging information was found for the patient.  Assessment and Plan:  1.  CLL.  Pt was Diagnosed 25+ years ago. Has never required treatment.   Pt reports ongoing hot flashes.  She denies adenopathy.    Labs done 03/26/2018 reviewed and showed WBC 68.8 HB 13.2 plts 264,000.  Chemistries WNL with K+ 4.7 cr 0.87 and normal LFTs.  Pt remains on observation.  She will have repeat labs in 09/2018 and will be seen for follow-up in 03/2019 with labs.    2.  Stage III right breast cancer.  Pt was diagnosed in 1998/1999. Treated with neoadjuvant chemo, followed by (R) mastectomy, then XRT.  Started adjuvant Megace in 1999, which she has continued since that time.  She was recommended to discontinue megace by NP Renato Battles when she was seen 08/02/2017.    Pt had left screening mammogram done 03/26/2018 that was negative.  She is recommended for mammogram in 03/2019 and will be seen in 03/2019 with labs and mammogram results.    3. Decreased (R) shoulder ROM and associated (R) chest wall/axillary pain.  Pt was given option of CXR or PT referral.  Pt desires PT referral.  She should notify the office if no improvement in symptoms.    25 minutes spent with more than 50% spent in counseling and coordination of care.    Current Status:  Pt is seen today for follow-up.  She is here to go over mammogram and labs.  She reports some hot flashes and problems with right  arm.    Problem List Patient Active Problem List   Diagnosis Date Noted  . SBO (small bowel obstruction) ([place]) [K56.609] 06/05/2015  . Hyperglycemia [R73.9] 06/05/2015  . Mass of arm [R22.30] 11/15/2011  . CLL (chronic lymphocytic leukemia) ([place]) [C91.10] 07/21/2011  . Breast cancer, stage 3 ([place]) [C50.919] 07/21/2011  . ARTHRITIS, RIGHT HIP [M16.10] 09/23/2008  . HYPERLIPIDEMIA [E78.5] 04/25/2008  . OSA (obstructive sleep apnea) [G47.33] 01/16/2008  . FATIGUE [R53.81, R53.83] 09/14/2007  . ANXIETY [F41.1] 01/08/2007  . DEPRESSION [F32.9] 01/08/2007  . ALLERGIC RHINITIS [J30.9] 01/08/2007  . ARTHRITIS [M12.9] 01/08/2007  . HIP PAIN, RIGHT [M25.559] 01/08/2007  . LOW BACK PAIN [M54.5] 01/08/2007  . OSTEOPENIA [M89.9, M94.9] 01/08/2007  . MIGRAINES, HX OF [Z87.898] 01/08/2007    Past Medical History Past Medical History:  Diagnosis Date  . Bowel obstruction ([place])   . Breast cancer ([place])   . Breast cancer, stage 3 ([place]) 07/21/2011  . Bronchitis   . CLL (chronic lymphocytic leukemia) ([place]) 07/21/2011  . Migraines     Past Surgical History Past Surgical History:  Procedure Laterality Date  . ABDOMINAL HYSTERECTOMY    . APPENDECTOMY    . CHOLECYSTECTOMY    . LAPAROTOMY N/A 06/08/2015   Procedure: EXPLORATORY LAPAROTOMY;  Surgeon: Aviva Signs, MD;  Location: [facility];  Service: General;  Laterality: N/A;  . LYSIS OF ADHESION N/A 06/08/2015   Procedure: LYSIS OF ADHESION;  Surgeon: Aviva Signs, MD;  Location: [facility];  Service: General;  Laterality: N/A;  . MASTECTOMY     right  . TOTAL HIP ARTHROPLASTY  right    Family History Family History  Problem Relation Age of Onset  . Cancer Brother   . Cancer Unknown      Social History  reports that she has never smoked. She has never used smokeless tobacco. She reports that she does not drink alcohol or use drugs.  Medications  Current Outpatient Medications:  .  albuterol (PROVENTIL HFA;VENTOLIN HFA) 108 (90 BASE)  MCG/ACT inhaler, Inhale 2 puffs into the lungs every 6 (six) hours as needed for shortness of breath. , Disp: , Rfl:  .  albuterol (PROVENTIL) (2.5 MG/3ML) 0.083% nebulizer solution, Take 2.5 mg by nebulization every 4 (four) hours as needed for wheezing or shortness of breath., Disp: , Rfl:  .  aspirin 325 MG tablet, Take 81 mg by mouth daily. , Disp: , Rfl:  .  cholecalciferol (VITAMIN D) 1000 UNITS tablet, Take 1,000 Units by mouth daily., Disp: , Rfl:  .  gabapentin (NEURONTIN) 100 MG capsule, Take 100-200 mg by mouth 2 (two) times daily. Take one capsule in AM, 2 capsules at bedtime, Disp: , Rfl:  .  omeprazole (PRILOSEC) 20 MG capsule, Take 20 mg by mouth at bedtime. , Disp: , Rfl:   Allergies Codeine and Meclizine  Review of Systems Review of Systems - Oncology ROS negative other than hot flashes and joint pain.     Physical Exam  Vitals Wt Readings from Last 3 Encounters:  03/29/18 178 lb (80.7 kg)  01/03/18 183 lb (83 kg)  09/28/17 183 lb (83 kg)   Temp Readings from Last 3 Encounters:  03/29/18 97.8 F (36.6 C) (Oral)  09/08/17 98.3 F (36.8 C) (Oral)  08/02/17 99.1 F (37.3 C) (Oral)   BP Readings from Last 3 Encounters:  03/29/18 (!) 134/91  01/03/18 (!) 110/57  09/28/17 106/72   Pulse Readings from Last 3 Encounters:  03/29/18 65  01/03/18 91  09/28/17 88   Constitutional: Well-developed, well-nourished, and in no distress.   HENT: Head: Normocephalic and atraumatic.  Mouth/Throat: No oropharyngeal exudate. Mucosa moist. Eyes: Pupils are equal, round, and reactive to light. Conjunctivae are normal. No scleral icterus.  Neck: Normal range of motion. Neck supple. No JVD present.  Cardiovascular: Normal rate, regular rhythm and normal heart sounds.  Exam reveals no gallop and no friction rub.   No murmur heard. Pulmonary/Chest: Effort normal and breath sounds normal. No respiratory distress. No wheezes.No rales.  Abdominal: Soft. Bowel sounds are normal. No  distension. There is no tenderness. There is no guarding.  Musculoskeletal: No edema or tenderness.  Lymphadenopathy: No cervical, axillary or supraclavicular adenopathy.  Neurological: Alert and oriented to person, place, and time. No cranial nerve deficit.  Skin: Skin is warm and dry. No rash noted. No erythema. No pallor.  Psychiatric: Affect and judgment normal.  Breast exam:  Chaperone present.  Right mastectomy site healed.  Pt reports some decreased ROM of right arm.  Left breast shows no dominant masses.    Labs No visits with results within 3 Day(s) from this visit.  Latest known visit with results is:  Appointment on 03/26/2018  Component Date Value Ref Range Status  . WBC 03/26/2018 68.8* 4.0 - 10.5 K/uL Final   This critical result has verified and been called to [facility] by Alwyn Pea on 11 04 2019 at 1208, and has been read back.   Marland Kitchen RBC 03/26/2018 4.59  3.87 - 5.11 MIL/uL Final  . Hemoglobin 03/26/2018 13.2  12.0 - 15.0 g/dL Final  .  HCT 03/26/2018 44.2  36.0 - 46.0 % Final  . MCV 03/26/2018 96.3  80.0 - 100.0 fL Final  . MCH 03/26/2018 28.8  26.0 - 34.0 pg Final  . MCHC 03/26/2018 29.9* 30.0 - 36.0 g/dL Final  . RDW 03/26/2018 15.1  11.5 - 15.5 % Final  . Platelets 03/26/2018 264  150 - 400 K/uL Final  . nRBC 03/26/2018 0.0  0.0 - 0.2 % Final  . Neutrophils Relative % 03/26/2018 9  % Final  . Neutro Abs 03/26/2018 5.9  1.7 - 7.7 K/uL Final  . Lymphocytes Relative 03/26/2018 87  % Final  . Lymphs Abs 03/26/2018 60.1* 0.7 - 4.0 K/uL Final  . Monocytes Relative 03/26/2018 3  % Final  . Monocytes Absolute 03/26/2018 2.1* 0.1 - 1.0 K/uL Final  . Eosinophils Relative 03/26/2018 1  % Final  . Eosinophils Absolute 03/26/2018 0.4  0.0 - 0.5 K/uL Final  . Basophils Relative 03/26/2018 0  % Final  . Basophils Absolute 03/26/2018 0.2* 0.0 - 0.1 K/uL Final  . WBC Morphology 03/26/2018 ABSOLUTE LYMPHOCYTOSIS   Final   Comment: SMUDGE CELLS WHITE CELLS CONFIRMED BY  SMEAR   . Immature Granulocytes 03/26/2018 0  % Final  . Abs Immature Granulocytes 03/26/2018 0.11* 0.00 - 0.07 K/uL Final  . Reactive, Benign Lymphocytes 03/26/2018 PRESENT   Final   Performed at [facility], [address] [place], [place] [address]  . Sodium 03/26/2018 139  135 - 145 mmol/L Final  . Potassium 03/26/2018 4.7  3.5 - 5.1 mmol/L Final  . Chloride 03/26/2018 103  98 - 111 mmol/L Final  . CO2 03/26/2018 25  22 - 32 mmol/L Final  . Glucose, Bld 03/26/2018 158* 70 - 99 mg/dL Final  . BUN 03/26/2018 25* 8 - 23 mg/dL Final  . Creatinine, Ser 03/26/2018 0.87  0.44 - 1.00 mg/dL Final  . Calcium 03/26/2018 9.4  8.9 - 10.3 mg/dL Final  . Total Protein 03/26/2018 7.0  6.5 - 8.1 g/dL Final  . Albumin 03/26/2018 4.2  3.5 - 5.0 g/dL Final  . AST 03/26/2018 24  15 - 41 U/L Final  . ALT 03/26/2018 19  0 - 44 U/L Final  . Alkaline Phosphatase 03/26/2018 54  38 - 126 U/L Final  . Total Bilirubin 03/26/2018 0.5  0.3 - 1.2 mg/dL Final  . GFR calc non Af Amer 03/26/2018 >60  >60 mL/min Final  . GFR calc Af Amer 03/26/2018 >60  >60 mL/min Final   Comment: (NOTE) The eGFR has been calculated using the CKD EPI equation. This calculation has not been validated in all clinical situations. eGFR's persistently <60 mL/min signify possible Chronic Kidney Disease.   Georgiann Hahn gap 03/26/2018 11  5 - 15 Final   Performed at [facility], [address] [place], [place] [address]  . LDH 03/26/2018 170  98 - 192 U/L Final   Performed at [facility], [address] [place], [place] [address]     Pathology Orders Placed This Encounter  Procedures  . MM Digital Screening Unilat L    Standing Status:   Future    Standing Expiration Date:   03/29/2019    Order Specific Question:   Reason for Exam (SYMPTOM  OR DIAGNOSIS REQUIRED)    Answer:   screening. History of right breast cancer    Order Specific Question:   Preferred imaging location?    Answer:   [facility]  . CBC with  Differential/Platelet    Standing  Status:   Future    Standing Expiration Date:   03/29/2020  . Comprehensive metabolic panel    Standing Status:   Future    Standing Expiration Date:   03/29/2020  . Lactate dehydrogenase    Standing Status:   Future    Standing Expiration Date:   03/29/2020  . CBC with Differential/Platelet    Standing Status:   Future    Standing Expiration Date:   03/29/2020  . Comprehensive metabolic panel    Standing Status:   Future    Standing Expiration Date:   03/29/2020  . Lactate dehydrogenase    Standing Status:   Future    Standing Expiration Date:   03/29/2020       Zoila Shutter MD

## 2018-04-09 ENCOUNTER — Telehealth (HOSPITAL_COMMUNITY): Payer: Self-pay | Admitting: Physical Therapy

## 2018-04-09 NOTE — Telephone Encounter (Signed)
Pt undertands benfits and confirmed apptment time. NF 04/09/18

## 2018-04-10 ENCOUNTER — Ambulatory Visit (HOSPITAL_COMMUNITY): Payer: Medicare HMO | Attending: Internal Medicine | Admitting: Physical Therapy

## 2018-04-10 ENCOUNTER — Other Ambulatory Visit: Payer: Self-pay

## 2018-04-10 DIAGNOSIS — M25611 Stiffness of right shoulder, not elsewhere classified: Secondary | ICD-10-CM | POA: Diagnosis present

## 2018-04-10 DIAGNOSIS — M25511 Pain in right shoulder: Secondary | ICD-10-CM | POA: Diagnosis not present

## 2018-04-10 DIAGNOSIS — G8929 Other chronic pain: Secondary | ICD-10-CM

## 2018-04-10 NOTE — Patient Instructions (Addendum)
ROM: Flexion - Wand (Supine)    Lie on back holding wand. Raise arms over head.  Repeat _5-10___ times per set. Do ___1_ sets per session. Do __2__ sessions per day.  http://orth.exer.us/928   Copyright  VHI. All rights reserved.  ROM: Abduction - Wand    Holding wand with right  hand palm up, push wand directly out to side, leading with other hand palm down, until stretch is felt. Hold _3___ seconds. Repeat _5-10___ times per set. Do _1___ sets per session. Do _2___ sessions per day.  http://orth.exer.us/746   Copyright  VHI. All rights reserved.  ROM: External / Internal Rotation - Wand    Bring wand up over head, then down toward waistline. Hold each position _3___ seconds. Repeat __5-10__ times per set. Do ___1_ sets per session. Do ___2_ sessions per day.  http://orth.exer.us/750   Copyright  VHI. All rights reserved.  Scapular Retraction (Standing)   May do while seated With arms at sides, pinch shoulder blades together. Repeat _10___ times per set. Do _1___ sets per session. Do ___2_ sessions per day.  http://orth.exer.us/944   Copyright  VHI. All rights reserved.

## 2018-04-10 NOTE — Therapy (Signed)
[place], [place], [address] Phone: [phone]   Fax:  [phone]  Physical Therapy Evaluation  Patient Details  Name: [name] MRN: [id number] Date of Birth: 1935-07-29 Referring Provider (PT): [name]   Encounter Date: 04/10/2018  PT End of Session - 04/10/18 1339    Visit Number  1    Number of Visits  8    Date for PT Re-Evaluation  05/10/18    PT Start Time  1300    PT Stop Time  1340    PT Time Calculation (min)  40 min    Activity Tolerance  Patient tolerated treatment well    Behavior During Therapy  [facility] for tasks assessed/performed       Past Medical History:  Diagnosis Date  . Bowel obstruction ([place])   . Breast cancer ([place])   . Breast cancer, stage 3 ([place]) 07/21/2011  . Bronchitis   . CLL (chronic lymphocytic leukemia) ([place]) 07/21/2011  . Migraines     Past Surgical History:  Procedure Laterality Date  . ABDOMINAL HYSTERECTOMY    . APPENDECTOMY    . CHOLECYSTECTOMY    . LAPAROTOMY N/A 06/08/2015   Procedure: EXPLORATORY LAPAROTOMY;  Surgeon: Veronica Signs, MD;  Location: [facility];  Service: General;  Laterality: N/A;  . LYSIS OF ADHESION N/A 06/08/2015   Procedure: LYSIS OF ADHESION;  Surgeon: Veronica Signs, MD;  Location: [facility];  Service: General;  Laterality: N/A;  . MASTECTOMY     right  . TOTAL HIP ARTHROPLASTY     right    There were no vitals filed for this visit.   Subjective Assessment - 04/10/18 1300    Subjective  Ms. Punch states that her right shoulder has been hurting her 24/7 for over 19 years.  She states that she has been telling the doctors but they kept saying that she just had to live with it.  She states that the pain is in the axillary area and  in her chest more than her shoulder.  There is nothing that makes it better or worse.      Pertinent History  remote hx, 20 years ago of  masectomy with chemo; Rt THR; LT OA in knee needing a TKR    Currently  in Pain?  Yes    Pain Score  10-Worst pain ever    Pain Location  Axilla    Pain Orientation  Right    Pain Descriptors / Indicators  Sore    Pain Type  Chronic pain    Pain Radiating Towards  down to hand     Pain Onset  More than a month ago    Pain Frequency  Constant    Aggravating Factors   not sure    Pain Relieving Factors  not sure     Effect of Pain on Daily Activities  limits          [facility] PT Assessment - 04/10/18 0001      Assessment   Medical Diagnosis  Rt shoulder stiffness    Referring Provider (PT)  [name]    Onset Date/Surgical Date  02/20/18    Hand Dominance  Right    Prior Therapy  none      Precautions   Precautions  None      Restrictions   Weight Bearing Restrictions  No      Balance Screen   Has the patient fallen in the past 6  months  No    Has the patient had a decrease in activity level because of a fear of falling?   No    Is the patient reluctant to leave their home because of a fear of falling?   No      Home Film/video editor residence      Prior Function   Level of Independence  Independent      Cognition   Overall Cognitive Status  Within Functional Limits for tasks assessed      ROM / Strength   AROM / PROM / Strength  AROM;Strength      AROM   AROM Assessment Site  Shoulder    Right/Left Shoulder  Right    Right Shoulder Flexion  135 Degrees    Right Shoulder ABduction  80 Degrees    Right Shoulder Internal Rotation  60 Degrees    Right Shoulder External Rotation  75 Degrees      Strength   Strength Assessment Site  Shoulder    Right/Left Shoulder  --   Within functional limits                Objective measurements completed on examination: See above findings.      [facility] Adult PT Treatment/Exercise - 04/10/18 0001      Exercises   Exercises  Shoulder      Shoulder Exercises: Supine   External Rotation  Right;5 reps    Internal Rotation  Right;5 reps    Flexion  Right;10  reps    Flexion Limitations  wand    ABduction  5 reps    ABduction Limitations  wand      Shoulder Exercises: Seated   Retraction  Both;10 reps      Manual Therapy   Manual Therapy  Soft tissue mobilization;Myofascial release    Manual therapy comments  done seperate from all other aspects of treatment    Soft tissue mobilization  to reduce mm tension    Myofascial Release  to  scar              PT Education - 04/10/18 1339    Education Details  HEP    Person(s) Educated  Patient    Methods  Explanation;Handout    Comprehension  Verbalized understanding;Returned demonstration       PT Short Term Goals - 04/10/18 1452      PT SHORT TERM GOAL #1   Title  PT pain in her Lt axillary/chest wall to be no greater than a 7/10 to allow pt to sleep for three hours straight.     Time  2    Period  Weeks    Status  New    Target Date  04/24/18      PT SHORT TERM GOAL #2   Title  Pt to be able to hang up clothes with her right arm without increased pain     Time  2    Period  Weeks    Status  New        PT Long Term Goals - 04/10/18 1454      PT LONG TERM GOAL #1   Title  Pt pain in her right axillary/chest wall to be no greater than a 5/10 to allow pt to sleep for 6 hours.    Time  4    Period  Weeks    Status  New    Target Date  05/08/18  PT LONG TERM GOAL #2   Title  PT Rt shoulder ROM to increase by 15 degrees to allow pt to be able to wash and style her hair without increased pain     Time  4    Period  Weeks    Status  New      PT LONG TERM GOAL #3   Title  PT Rt shoulder stability to be increased to allow pt to carry groceries into her house and put them away without having increased right shoulder pain.     Time  4    Period  Weeks    Status  New             Plan - 04/10/18 1340    Clinical Impression Statement  [name] is an 82 yo female who had a right masectomy 20 years ago.  She states that she has had extreme pain in her right  axillary and chest area for 19 years.  She has told several MD's who stated that she would just have to live with it.  She is now being referred to skilled physical therapy .  Evaluation demonstrates decreased ROM, increased pain, decreased scar mobility and impaired use of her right UE.  [name] will benefit from skilled PT to address these issues and maximize her functional ability.     History and Personal Factors relevant to plan of care:  Rt masectomy with chemo, Rt THR, OA especially in Lt knee.     Clinical Presentation  Stable    Rehab Potential  Good    PT Frequency  2x / week    PT Duration  4 weeks    PT Treatment/Interventions  Therapeutic activities;Therapeutic exercise;Patient/family education;Manual techniques;Passive range of motion    PT Next Visit Plan  continue with manual to include gentle PROM, begin wall walk as well as pulley and ball exercises to improve ROM    PT Home Exercise Plan  eval:  wand exercises as well as scapular retraction        Patient will benefit from skilled therapeutic intervention in order to improve the following deficits and impairments:  Decreased range of motion, Decreased skin integrity, Increased fascial restricitons, Impaired flexibility, Impaired UE functional use, Pain  Visit Diagnosis: Chronic right shoulder pain - Plan: PT plan of care cert/re-cert  Stiffness of right shoulder, not elsewhere classified - Plan: PT plan of care cert/re-cert     Problem List Patient Active Problem List   Diagnosis Date Noted  . SBO (small bowel obstruction) ([place]) 06/05/2015  . Hyperglycemia 06/05/2015  . Mass of arm 11/15/2011  . CLL (chronic lymphocytic leukemia) ([place]) 07/21/2011  . Breast cancer, stage 3 ([place]) 07/21/2011  . ARTHRITIS, RIGHT HIP 09/23/2008  . HYPERLIPIDEMIA 04/25/2008  . OSA (obstructive sleep apnea) 01/16/2008  . FATIGUE 09/14/2007  . ANXIETY 01/08/2007  . DEPRESSION 01/08/2007  . ALLERGIC RHINITIS 01/08/2007  . ARTHRITIS  01/08/2007  . HIP PAIN, RIGHT 01/08/2007  . LOW BACK PAIN 01/08/2007  . OSTEOPENIA 01/08/2007  . MIGRAINES, HX OF 01/08/2007    Rayetta Humphrey, PT CLT [phone] 04/10/2018, 2:59 PM  [place] [address] [place], [place], [address] Phone: [phone]   Fax:  [phone]  Name: [name] MRN: [id number] Date of Birth: 1935-08-16

## 2018-04-12 ENCOUNTER — Ambulatory Visit (HOSPITAL_COMMUNITY): Payer: Medicare HMO | Admitting: Physical Therapy

## 2018-04-12 DIAGNOSIS — M25511 Pain in right shoulder: Principal | ICD-10-CM

## 2018-04-12 DIAGNOSIS — M25611 Stiffness of right shoulder, not elsewhere classified: Secondary | ICD-10-CM

## 2018-04-12 DIAGNOSIS — G8929 Other chronic pain: Secondary | ICD-10-CM

## 2018-04-12 NOTE — Therapy (Signed)
[place], [place], [address] Phone: [phone]   Fax:  [phone]  Physical Therapy Treatment  Patient Details  Name: [name] MRN: [id number] Date of Birth: 09/27/1935 Referring Provider (PT): Mathis Dad Higgs   Encounter Date: 04/12/2018  PT End of Session - 04/12/18 1701    Visit Number  2    Number of Visits  8    Date for PT Re-Evaluation  05/10/18    PT Start Time  1941    PT Stop Time  1430    PT Time Calculation (min)  42 min    Activity Tolerance  Patient tolerated treatment well    Behavior During Therapy  [facility] for tasks assessed/performed       Past Medical History:  Diagnosis Date  . Bowel obstruction ([place])   . Breast cancer ([place])   . Breast cancer, stage 3 ([place]) 07/21/2011  . Bronchitis   . CLL (chronic lymphocytic leukemia) ([place]) 07/21/2011  . Migraines     Past Surgical History:  Procedure Laterality Date  . ABDOMINAL HYSTERECTOMY    . APPENDECTOMY    . CHOLECYSTECTOMY    . LAPAROTOMY N/A 06/08/2015   Procedure: EXPLORATORY LAPAROTOMY;  Surgeon: Aviva Signs, MD;  Location: [facility];  Service: General;  Laterality: N/A;  . LYSIS OF ADHESION N/A 06/08/2015   Procedure: LYSIS OF ADHESION;  Surgeon: Aviva Signs, MD;  Location: [facility];  Service: General;  Laterality: N/A;  . MASTECTOMY     right  . TOTAL HIP ARTHROPLASTY     right    There were no vitals filed for this visit.  Subjective Assessment - 04/12/18 1400    Subjective  Pt reports no pain today and has been compliant with HEP.    Currently in Pain?  No/denies    Pain Score  0-No pain                       [facility] Adult PT Treatment/Exercise - 04/12/18 0001      Exercises   Exercises  Shoulder      Shoulder Exercises: Seated   Retraction  Both;10 reps      Shoulder Exercises: Pulleys   Flexion  2 minutes    ABduction  2 minutes      Shoulder Exercises: Therapy Ball   Flexion  10 reps     ABduction  10 reps    Right/Left  5 reps    Right/Left Limitations  each direction      Shoulder Exercises: Stretch   Corner Stretch  3 reps;20 seconds      Manual Therapy   Manual Therapy  Soft tissue mobilization;Myofascial release;Passive ROM    Manual therapy comments  done seperate from all other aspects of treatment    Soft tissue mobilization  to reduce mm tension    Myofascial Release  to  scar     Passive ROM  to Rt shoulder all directions               PT Short Term Goals - 04/12/18 1702      PT SHORT TERM GOAL #1   Title  PT pain in her Lt axillary/chest wall to be no greater than a 7/10 to allow pt to sleep for three hours straight.     Time  2    Period  Weeks    Status  Achieved      PT SHORT TERM  GOAL #2   Title  Pt to be able to hang up clothes with her right arm without increased pain     Time  2    Period  Weeks    Status  On-going        PT Long Term Goals - 04/12/18 1702      PT LONG TERM GOAL #1   Title  Pt pain in her right axillary/chest wall to be no greater than a 5/10 to allow pt to sleep for 6 hours.    Time  4    Period  Weeks    Status  On-going      PT LONG TERM GOAL #2   Title  PT Rt shoulder ROM to increase by 15 degrees to allow pt to be able to wash and style her hair without increased pain     Time  4    Period  Weeks    Status  On-going      PT LONG TERM GOAL #3   Title  PT Rt shoulder stability to be increased to allow pt to carry groceries into her house and put them away without having increased right shoulder pain.     Time  4    Period  Weeks    Status  On-going            Plan - 04/12/18 1703    Clinical Impression Statement  Pt already pleased with progress made in past 2 days and has had little to no pain since beginning therapy.  Reveiwed goals and POC moving forward.  Began pulleys, ball ROM, corner stretch and PROM today.  Noted tightness in Rt pectoral/axillary region with manual and increased  sensitivity with full stretch.  Able to achieve approx 150 degrees flexion and 100 abduction.  Full ROM for ER/IR today.  Pt encouraged to continue primary focus on ROM at this point.    Rehab Potential  Good    PT Frequency  2x / week    PT Duration  4 weeks    PT Treatment/Interventions  Therapeutic activities;Therapeutic exercise;Patient/family education;Manual techniques;Passive range of motion    PT Next Visit Plan  continue with manual to include gentle PROM.  Next session begin wall walk and seated shoulder exercises.      PT Home Exercise Plan  eval:  wand exercises as well as scapular retraction        Patient will benefit from skilled therapeutic intervention in order to improve the following deficits and impairments:  Decreased range of motion, Decreased skin integrity, Increased fascial restricitons, Impaired flexibility, Impaired UE functional use, Pain  Visit Diagnosis: Chronic right shoulder pain  Stiffness of right shoulder, not elsewhere classified     Problem List Patient Active Problem List   Diagnosis Date Noted  . SBO (small bowel obstruction) ([place]) 06/05/2015  . Hyperglycemia 06/05/2015  . Mass of arm 11/15/2011  . CLL (chronic lymphocytic leukemia) ([place]) 07/21/2011  . Breast cancer, stage 3 ([place]) 07/21/2011  . ARTHRITIS, RIGHT HIP 09/23/2008  . HYPERLIPIDEMIA 04/25/2008  . OSA (obstructive sleep apnea) 01/16/2008  . FATIGUE 09/14/2007  . ANXIETY 01/08/2007  . DEPRESSION 01/08/2007  . ALLERGIC RHINITIS 01/08/2007  . ARTHRITIS 01/08/2007  . HIP PAIN, RIGHT 01/08/2007  . LOW BACK PAIN 01/08/2007  . OSTEOPENIA 01/08/2007  . MIGRAINES, HX OF 01/08/2007   [name], PTA/CLT [phone]  [name] 04/12/2018, 5:07 PM  [place]  [place], [place], [address] Phone: [phone]   Fax:  [phone]  Name: [name] MRN: [id number] Date of Birth: [birth year]

## 2018-04-16 ENCOUNTER — Ambulatory Visit (HOSPITAL_COMMUNITY): Payer: Medicare HMO | Admitting: Physical Therapy

## 2018-04-16 DIAGNOSIS — M25511 Pain in right shoulder: Secondary | ICD-10-CM | POA: Diagnosis not present

## 2018-04-16 DIAGNOSIS — G8929 Other chronic pain: Secondary | ICD-10-CM

## 2018-04-16 DIAGNOSIS — M25611 Stiffness of right shoulder, not elsewhere classified: Secondary | ICD-10-CM

## 2018-04-16 NOTE — Therapy (Signed)
[place], [place], [address] Phone: [phone]   Fax:  [phone]  Physical Therapy Treatment  Patient Details  Name: [name] MRN: [id number] Date of Birth: Jun 07, 1935 Referring Provider (PT): Mathis Dad Higgs   Encounter Date: 04/16/2018  PT End of Session - 04/16/18 1424    Visit Number  3    Number of Visits  8    Date for PT Re-Evaluation  05/10/18    PT Start Time  1304    PT Stop Time  1350    PT Time Calculation (min)  46 min    Activity Tolerance  Patient tolerated treatment well    Behavior During Therapy  [facility] for tasks assessed/performed       Past Medical History:  Diagnosis Date  . Bowel obstruction ([place])   . Breast cancer ([place])   . Breast cancer, stage 3 ([place]) 07/21/2011  . Bronchitis   . CLL (chronic lymphocytic leukemia) ([place]) 07/21/2011  . Migraines     Past Surgical History:  Procedure Laterality Date  . ABDOMINAL HYSTERECTOMY    . APPENDECTOMY    . CHOLECYSTECTOMY    . LAPAROTOMY N/A 06/08/2015   Procedure: EXPLORATORY LAPAROTOMY;  Surgeon: Aviva Signs, MD;  Location: [facility];  Service: General;  Laterality: N/A;  . LYSIS OF ADHESION N/A 06/08/2015   Procedure: LYSIS OF ADHESION;  Surgeon: Aviva Signs, MD;  Location: [facility];  Service: General;  Laterality: N/A;  . MASTECTOMY     right  . TOTAL HIP ARTHROPLASTY     right    There were no vitals filed for this visit.  Subjective Assessment - 04/16/18 1317    Subjective  Pt states she is now pain free and has been since the first PT visit.  Pt reports she is very pleased.    Currently in Pain?  No/denies                       [facility] Adult PT Treatment/Exercise - 04/16/18 0001      Exercises   Exercises  Shoulder      Shoulder Exercises: Seated   Retraction  Both;10 reps    External Rotation  10 reps    Internal Rotation  10 reps    Flexion  Right;10 reps    Abduction  10 reps      Shoulder  Exercises: Standing   Flexion  Both;5 reps      Shoulder Exercises: Pulleys   Flexion  2 minutes    ABduction  2 minutes      Shoulder Exercises: Therapy Ball   Flexion  10 reps    ABduction  10 reps    Right/Left  10 reps    Right/Left Limitations  each direction      Shoulder Exercises: Stretch   Corner Stretch  3 reps;20 seconds      Manual Therapy   Manual Therapy  Soft tissue mobilization;Myofascial release;Passive ROM    Manual therapy comments  done seperate from all other aspects of treatment    Soft tissue mobilization  to reduce mm tension    Myofascial Release  to  scar     Passive ROM  to Rt shoulder all directions               PT Short Term Goals - 04/12/18 1702      PT SHORT TERM GOAL #1   Title  PT pain in  her Lt axillary/chest wall to be no greater than a 7/10 to allow pt to sleep for three hours straight.     Time  2    Period  Weeks    Status  Achieved      PT SHORT TERM GOAL #2   Title  Pt to be able to hang up clothes with her right arm without increased pain     Time  2    Period  Weeks    Status  On-going        PT Long Term Goals - 04/12/18 1702      PT LONG TERM GOAL #1   Title  Pt pain in her right axillary/chest wall to be no greater than a 5/10 to allow pt to sleep for 6 hours.    Time  4    Period  Weeks    Status  On-going      PT LONG TERM GOAL #2   Title  PT Rt shoulder ROM to increase by 15 degrees to allow pt to be able to wash and style her hair without increased pain     Time  4    Period  Weeks    Status  On-going      PT LONG TERM GOAL #3   Title  PT Rt shoulder stability to be increased to allow pt to carry groceries into her house and put them away without having increased right shoulder pain.     Time  4    Period  Weeks    Status  On-going            Plan - 04/16/18 1425    Clinical Impression Statement  Pt with vast improvements now without pain and improving ROM.  Pt able to complete all  exercises, however required breaks due to mm fatigue and end range discomfort.  Progressed to seated AROM exercises.     Rehab Potential  Good    PT Frequency  2x / week    PT Duration  4 weeks    PT Treatment/Interventions  Therapeutic activities;Therapeutic exercise;Patient/family education;Manual techniques;Passive range of motion    PT Next Visit Plan  continue with manual to include gentle PROM.  continue with ROM against gravity    PT Home Exercise Plan  eval:  wand exercises as well as scapular retraction        Patient will benefit from skilled therapeutic intervention in order to improve the following deficits and impairments:  Decreased range of motion, Decreased skin integrity, Increased fascial restricitons, Impaired flexibility, Impaired UE functional use, Pain  Visit Diagnosis: Chronic right shoulder pain  Stiffness of right shoulder, not elsewhere classified     Problem List Patient Active Problem List   Diagnosis Date Noted  . SBO (small bowel obstruction) ([place]) 06/05/2015  . Hyperglycemia 06/05/2015  . Mass of arm 11/15/2011  . CLL (chronic lymphocytic leukemia) ([place]) 07/21/2011  . Breast cancer, stage 3 ([place]) 07/21/2011  . ARTHRITIS, RIGHT HIP 09/23/2008  . HYPERLIPIDEMIA 04/25/2008  . OSA (obstructive sleep apnea) 01/16/2008  . FATIGUE 09/14/2007  . ANXIETY 01/08/2007  . DEPRESSION 01/08/2007  . ALLERGIC RHINITIS 01/08/2007  . ARTHRITIS 01/08/2007  . HIP PAIN, RIGHT 01/08/2007  . LOW BACK PAIN 01/08/2007  . OSTEOPENIA 01/08/2007  . MIGRAINES, HX OF 01/08/2007   Teena Irani, PTA/CLT [phone]  Teena Irani 04/16/2018, 2:30 PM  [place] [facility] [address] [place], [place], [address] Phone:  [phone]   Fax:  [phone]  Name: AMERICA SANDALL MRN: [id number] Date of Birth: 1935-09-16

## 2018-04-17 ENCOUNTER — Ambulatory Visit (HOSPITAL_COMMUNITY): Payer: Medicare HMO | Admitting: Physical Therapy

## 2018-04-17 ENCOUNTER — Encounter (HOSPITAL_COMMUNITY): Payer: Self-pay | Admitting: Physical Therapy

## 2018-04-17 DIAGNOSIS — M25511 Pain in right shoulder: Principal | ICD-10-CM

## 2018-04-17 DIAGNOSIS — M25611 Stiffness of right shoulder, not elsewhere classified: Secondary | ICD-10-CM

## 2018-04-17 DIAGNOSIS — G8929 Other chronic pain: Secondary | ICD-10-CM

## 2018-04-17 NOTE — Therapy (Signed)
[place], [place], [address] Phone: [phone]   Fax:  [phone]  Physical Therapy Treatment/Discharge  Patient Details  Name: [name] MRN: [id number] Date of Birth: April 02, 1936 Referring Provider (PT): Mathis Dad Higgs   Encounter Date: 04/17/2018  PHYSICAL THERAPY DISCHARGE SUMMARY  Visits from Start of Care: 4 Current functional level related to goals / functional outcomes: See    Remaining deficits: None; ROM is functional and equal to left   Education / Equipment: HEP Plan: Patient agrees to discharge.  Patient goals were not met. Patient is being discharged due to meeting the stated rehab goals.  ?????      PT End of Session - 04/17/18 1401    Visit Number  4    Number of Visits  4    Date for PT Re-Evaluation  05/10/18    PT Start Time  7628    PT Stop Time  1405    PT Time Calculation (min)  20 min    Activity Tolerance  Patient tolerated treatment well    Behavior During Therapy  [facility] for tasks assessed/performed       Past Medical History:  Diagnosis Date  . Bowel obstruction ([place])   . Breast cancer ([place])   . Breast cancer, stage 3 ([place]) 07/21/2011  . Bronchitis   . CLL (chronic lymphocytic leukemia) ([place]) 07/21/2011  . Migraines     Past Surgical History:  Procedure Laterality Date  . ABDOMINAL HYSTERECTOMY    . APPENDECTOMY    . CHOLECYSTECTOMY    . LAPAROTOMY N/A 06/08/2015   Procedure: EXPLORATORY LAPAROTOMY;  Surgeon: Aviva Signs, MD;  Location: [facility];  Service: General;  Laterality: N/A;  . LYSIS OF ADHESION N/A 06/08/2015   Procedure: LYSIS OF ADHESION;  Surgeon: Aviva Signs, MD;  Location: [facility];  Service: General;  Laterality: N/A;  . MASTECTOMY     right  . TOTAL HIP ARTHROPLASTY     right    There were no vitals filed for this visit.  Subjective Assessment - 04/17/18 1347    Subjective  PT states that she is still painfree and is using her arm for all  activities.     Currently in Pain?  No/denies         [facility] PT Assessment - 04/17/18 0001      Assessment   Medical Diagnosis  Rt shoulder stiffness    Referring Provider (PT)  Mathis Dad Higgs    Onset Date/Surgical Date  02/20/18    Hand Dominance  Right    Prior Therapy  none      Precautions   Precautions  None      Restrictions   Weight Bearing Restrictions  No      Home Environment   Living Environment  Private residence      Prior Function   Level of Independence  Independent      Cognition   Overall Cognitive Status  Within Functional Limits for tasks assessed      AROM   Right Shoulder Flexion  155 Degrees   was 135   Right Shoulder ABduction  153 Degrees   was 80   Right Shoulder Internal Rotation  60 Degrees    Right Shoulder External Rotation  85 Degrees   was 75                   [facility] Adult PT Treatment/Exercise - 04/17/18 0001  Exercises   Exercises  Shoulder Pulley 2' each for flexion and abduction.     Shoulder Exercises: Seated   External Rotation  10 reps    Internal Rotation  10 reps    Flexion  Right;10 reps    Abduction  10 reps               PT Short Term Goals - 04/17/18 1350      PT SHORT TERM GOAL #1   Title  PT pain in her Lt axillary/chest wall to be no greater than a 7/10 to allow pt to sleep for three hours straight.     Time  2    Period  Weeks    Status  Achieved      PT SHORT TERM GOAL #2   Title  Pt to be able to hang up clothes with her right arm without increased pain     Time  2    Period  Weeks    Status  Achieved        PT Long Term Goals - 04/17/18 1350      PT LONG TERM GOAL #1   Title  Pt pain in her right axillary/chest wall to be no greater than a 5/10 to allow pt to sleep for 6 hours.    Time  4    Period  Weeks    Status  On-going      PT LONG TERM GOAL #2   Title  PT Rt shoulder ROM to increase by 15 degrees to allow pt to be able to wash and style her hair without  increased pain     Time  4    Period  Weeks    Status  Achieved      PT LONG TERM GOAL #3   Title  PT Rt shoulder stability to be increased to allow pt to carry groceries into her house and put them away without having increased right shoulder pain.     Time  4    Period  Weeks    Status  Achieved            Plan - 04/17/18 1401    Clinical Impression Statement  Pt is able to do everything she wants now and has no pain.  She has been painfree for a week now.  Pt reassessed with functional mobility.  Pt no longer needs skilled physical therapy     Rehab Potential  Good    PT Frequency  2x / week    PT Duration  4 weeks    PT Treatment/Interventions  Therapeutic activities;Therapeutic exercise;Patient/family education;Manual techniques;Passive range of motion    PT Next Visit Plan  continue with manual to include gentle PROM.  continue with ROM against gravity    PT Home Exercise Plan  eval:  wand exercises as well as scapular retraction        Patient will benefit from skilled therapeutic intervention in order to improve the following deficits and impairments:  Decreased range of motion, Decreased skin integrity, Increased fascial restricitons, Impaired flexibility, Impaired UE functional use, Pain  Visit Diagnosis: Chronic right shoulder pain  Stiffness of right shoulder, not elsewhere classified     Problem List Patient Active Problem List   Diagnosis Date Noted  . SBO (small bowel obstruction) ([place]) 06/05/2015  . Hyperglycemia 06/05/2015  . Mass of arm 11/15/2011  . CLL (chronic lymphocytic leukemia) ([place]) 07/21/2011  . Breast cancer, stage 3 ([place]) 07/21/2011  .  ARTHRITIS, RIGHT HIP 09/23/2008  . HYPERLIPIDEMIA 04/25/2008  . OSA (obstructive sleep apnea) 01/16/2008  . FATIGUE 09/14/2007  . ANXIETY 01/08/2007  . DEPRESSION 01/08/2007  . ALLERGIC RHINITIS 01/08/2007  . ARTHRITIS 01/08/2007  . HIP PAIN, RIGHT 01/08/2007  . LOW BACK PAIN 01/08/2007  . OSTEOPENIA  01/08/2007  . MIGRAINES, HX OF 01/08/2007    Rayetta Humphrey, PT CLT [phone] 04/17/2018, 2:09 PM  [place] [address] [place], [place], [address] Phone: [phone]   Fax:  [phone]  Name: [name] MRN: [id number] Date of Birth: 06-Jan-1936

## 2018-04-17 NOTE — Patient Instructions (Addendum)
ROM: Flexion (Standing)    Bring arms straight out in front and raise as high as possible without pain. Keep palms facing up. Repeat _10___ times per set. Do _1___ sets per session. Do __1__ sessions per day.  http://orth.exer.us/908   Copyright  VHI. All rights reserved.  ROM: Abduction (Standing)    Bring arms straight out from sides and raise as high as possible without pain. Repeat _10___ times per set. Do 1____ sets per session. Do __2__ sessions per day.  http://orth.exer.us/910   Copyright  VHI. All rights reserved.

## 2018-04-18 ENCOUNTER — Encounter

## 2018-04-26 ENCOUNTER — Ambulatory Visit (HOSPITAL_COMMUNITY): Payer: Medicare HMO | Admitting: Physical Therapy

## 2018-05-01 ENCOUNTER — Ambulatory Visit (HOSPITAL_COMMUNITY): Payer: Medicare HMO | Admitting: Physical Therapy

## 2018-05-03 ENCOUNTER — Ambulatory Visit (HOSPITAL_COMMUNITY): Payer: Medicare HMO | Admitting: Physical Therapy

## 2018-05-08 ENCOUNTER — Ambulatory Visit (HOSPITAL_COMMUNITY): Payer: Medicare HMO | Admitting: Physical Therapy

## 2018-05-10 ENCOUNTER — Ambulatory Visit (HOSPITAL_COMMUNITY): Payer: Medicare HMO | Admitting: Physical Therapy

## 2018-06-26 ENCOUNTER — Other Ambulatory Visit: Payer: Self-pay

## 2018-06-26 ENCOUNTER — Emergency Department (HOSPITAL_COMMUNITY): Payer: Medicare HMO

## 2018-06-26 ENCOUNTER — Encounter (HOSPITAL_COMMUNITY): Payer: Self-pay | Admitting: *Deleted

## 2018-06-26 ENCOUNTER — Observation Stay (HOSPITAL_COMMUNITY)
Admission: EM | Admit: 2018-06-26 | Discharge: 2018-06-27 | Disposition: A | Discharge status: Home / Self Care | Payer: Medicare HMO | Attending: Internal Medicine | Admitting: Internal Medicine

## 2018-06-26 DIAGNOSIS — R739 Hyperglycemia, unspecified: Secondary | ICD-10-CM | POA: Diagnosis present

## 2018-06-26 DIAGNOSIS — C9110 Chronic lymphocytic leukemia of B-cell type not having achieved remission: Secondary | ICD-10-CM | POA: Diagnosis present

## 2018-06-26 DIAGNOSIS — I4891 Unspecified atrial fibrillation: Secondary | ICD-10-CM | POA: Diagnosis not present

## 2018-06-26 DIAGNOSIS — Z856 Personal history of leukemia: Secondary | ICD-10-CM | POA: Diagnosis not present

## 2018-06-26 DIAGNOSIS — J449 Chronic obstructive pulmonary disease, unspecified: Secondary | ICD-10-CM | POA: Diagnosis present

## 2018-06-26 DIAGNOSIS — G4733 Obstructive sleep apnea (adult) (pediatric): Secondary | ICD-10-CM | POA: Diagnosis not present

## 2018-06-26 DIAGNOSIS — Z79899 Other long term (current) drug therapy: Secondary | ICD-10-CM | POA: Diagnosis not present

## 2018-06-26 DIAGNOSIS — J441 Chronic obstructive pulmonary disease with (acute) exacerbation: Principal | ICD-10-CM | POA: Insufficient documentation

## 2018-06-26 DIAGNOSIS — Z853 Personal history of malignant neoplasm of breast: Secondary | ICD-10-CM | POA: Insufficient documentation

## 2018-06-26 DIAGNOSIS — Z7982 Long term (current) use of aspirin: Secondary | ICD-10-CM | POA: Diagnosis not present

## 2018-06-26 DIAGNOSIS — Z96641 Presence of right artificial hip joint: Secondary | ICD-10-CM | POA: Diagnosis not present

## 2018-06-26 DIAGNOSIS — R0602 Shortness of breath: Secondary | ICD-10-CM

## 2018-06-26 DIAGNOSIS — E785 Hyperlipidemia, unspecified: Secondary | ICD-10-CM | POA: Diagnosis not present

## 2018-06-26 DIAGNOSIS — E782 Mixed hyperlipidemia: Secondary | ICD-10-CM | POA: Diagnosis present

## 2018-06-26 LAB — COMPREHENSIVE METABOLIC PANEL
ALBUMIN: 4 g/dL (ref 3.5–5.0)
ALT: 16 U/L (ref 0–44)
AST: 22 U/L (ref 15–41)
Alkaline Phosphatase: 48 U/L (ref 38–126)
Anion gap: 10 (ref 5–15)
BUN: 16 mg/dL (ref 8–23)
CO2: 25 mmol/L (ref 22–32)
Calcium: 8.6 mg/dL — ABNORMAL LOW (ref 8.9–10.3)
Chloride: 105 mmol/L (ref 98–111)
Creatinine, Ser: 0.73 mg/dL (ref 0.44–1.00)
GFR calc Af Amer: >60 mL/min (ref >60)
GFR calc non Af Amer: >60 mL/min (ref >60)
GLUCOSE: 121 mg/dL — AB (ref 70–99)
Potassium: 3.1 mmol/L — ABNORMAL LOW (ref 3.5–5.1)
Sodium: 140 mmol/L (ref 135–145)
Total Bilirubin: 0.5 mg/dL (ref 0.3–1.2)
Total Protein: 6.7 g/dL (ref 6.5–8.1)

## 2018-06-26 LAB — CBC WITH DIFFERENTIAL/PLATELET
BASOS PCT: 0 %
Basophils Absolute: 0.2 10*3/uL — ABNORMAL HIGH (ref 0.0–0.1)
Eosinophils Absolute: 0.3 10*3/uL (ref 0.0–0.5)
Eosinophils Relative: 1 %
HCT: 39.6 % (ref 36.0–46.0)
Hemoglobin: 12.1 g/dL (ref 12.0–15.0)
Lymphocytes Relative: 81 %
Lymphs Abs: 32.1 10*3/uL — ABNORMAL HIGH (ref 0.7–4.0)
MCH: 28.2 pg (ref 26.0–34.0)
MCHC: 30.6 g/dL (ref 30.0–36.0)
MCV: 92.3 fL (ref 80.0–100.0)
Monocytes Absolute: 1.5 10*3/uL — ABNORMAL HIGH (ref 0.1–1.0)
Monocytes Relative: 4 %
Neutro Abs: 5.7 10*3/uL (ref 1.7–7.7)
Neutrophils Relative %: 14 %
Platelets: 234 10*3/uL (ref 150–400)
RBC: 4.29 MIL/uL (ref 3.87–5.11)
RDW: 14.6 % (ref 11.5–15.5)
WBC: 39.8 10*3/uL — ABNORMAL HIGH (ref 4.0–10.5)
nRBC: 0.1 % (ref 0.0–0.2)

## 2018-06-26 LAB — PROTIME-INR
INR: 1.13
Prothrombin Time: 14.4 seconds (ref 11.4–15.2)

## 2018-06-26 LAB — BRAIN NATRIURETIC PEPTIDE: B NATRIURETIC PEPTIDE 5: 669 pg/mL — AB (ref 0.0–100.0)

## 2018-06-26 LAB — TROPONIN I: Troponin I: <0.03 ng/mL (ref <0.03)

## 2018-06-26 MED ORDER — DILTIAZEM LOAD VIA INFUSION
10.0000 mg | Freq: Once | INTRAVENOUS | Status: DC
  Filled 2018-06-26: qty 10

## 2018-06-26 MED ORDER — IPRATROPIUM-ALBUTEROL 0.5-2.5 (3) MG/3ML IN SOLN
3.0000 mL | Freq: Once | RESPIRATORY_TRACT | Status: AC
  Administered 2018-06-26: 3 mL via RESPIRATORY_TRACT
  Filled 2018-06-26: qty 3

## 2018-06-26 MED ORDER — SODIUM CHLORIDE 0.9 % IV SOLN: INTRAVENOUS | Status: DC

## 2018-06-26 MED ORDER — SODIUM CHLORIDE 0.9 % IV BOLUS: 500.0000 mL | Freq: Once | INTRAVENOUS | Status: DC

## 2018-06-26 MED ORDER — DILTIAZEM HCL 100 MG IV SOLR
5.0000 mg/h | INTRAVENOUS | Status: DC
  Administered 2018-06-27: 5 mg/h via INTRAVENOUS
  Filled 2018-06-26: qty 100

## 2018-06-26 NOTE — ED Provider Notes (Signed)
[facility] EMERGENCY DEPARTMENT Provider Note   CSN: [id number] Arrival date & time: 06/26/18  2119     History   Chief Complaint Chief Complaint  Patient presents with  . Shortness of Breath    HPI [name] is a 83 y.o. female.  Patient lives by herself.  She called EMS for shortness of breath.  EMS noted she had a irregular fast heart rate consistent with atrial fib.  Patient with no known history of atrial fibrillation.  Patient states she was recently started on albuterol for shortness of breath.  But no known history of COPD or asthma.  Primary care doctor is Dr. Karie Kirks.  Patient's past medical history is significant for chronic lymphocytic leukemia and past stage III breast cancer.  Both of those initially diagnosed in 2013.  Patient's had a right-sided mastectomy.  And abdominal hysterectomy.  Patient is never had a history of irregular heart rate in the past.  Patient denies any chest pain.  Does have a cough does have a little bit of chest discomfort when she coughs only.  And her cough is very raspy.  Denies any fevers.  Denies any body aches nausea vomiting or diarrhea.     Past Medical History:  Diagnosis Date  . Bowel obstruction ([place])   . Breast cancer ([place])   . Breast cancer, stage 3 ([place]) 07/21/2011  . Bronchitis   . CLL (chronic lymphocytic leukemia) ([place]) 07/21/2011  . Migraines     Patient Active Problem List   Diagnosis Date Noted  . SBO (small bowel obstruction) ([place]) 06/05/2015  . Hyperglycemia 06/05/2015  . Mass of arm 11/15/2011  . CLL (chronic lymphocytic leukemia) ([place]) 07/21/2011  . Breast cancer, stage 3 ([place]) 07/21/2011  . ARTHRITIS, RIGHT HIP 09/23/2008  . HYPERLIPIDEMIA 04/25/2008  . OSA (obstructive sleep apnea) 01/16/2008  . FATIGUE 09/14/2007  . ANXIETY 01/08/2007  . DEPRESSION 01/08/2007  . ALLERGIC RHINITIS 01/08/2007  . ARTHRITIS 01/08/2007  . HIP PAIN, RIGHT 01/08/2007  . LOW BACK PAIN 01/08/2007  . OSTEOPENIA  01/08/2007  . MIGRAINES, HX OF 01/08/2007    Past Surgical History:  Procedure Laterality Date  . ABDOMINAL HYSTERECTOMY    . APPENDECTOMY    . CHOLECYSTECTOMY    . LAPAROTOMY N/A 06/08/2015   Procedure: EXPLORATORY LAPAROTOMY;  Surgeon: Aviva Signs, MD;  Location: [facility];  Service: General;  Laterality: N/A;  . LYSIS OF ADHESION N/A 06/08/2015   Procedure: LYSIS OF ADHESION;  Surgeon: Aviva Signs, MD;  Location: [facility];  Service: General;  Laterality: N/A;  . MASTECTOMY     right  . TOTAL HIP ARTHROPLASTY     right     OB History   No obstetric history on file.      Home Medications    Prior to Admission medications   Medication Sig Start Date End Date Taking? Authorizing Provider  albuterol (PROVENTIL HFA;VENTOLIN HFA) 108 (90 BASE) MCG/ACT inhaler Inhale 2 puffs into the lungs every 6 (six) hours as needed for shortness of breath.    Yes [provider]  albuterol (PROVENTIL) (2.5 MG/3ML) 0.083% nebulizer solution Take 2.5 mg by nebulization every 4 (four) hours as needed for wheezing or shortness of breath.   Yes [provider]  aspirin 325 MG tablet Take 325 mg by mouth daily.    Yes [provider]  cholecalciferol (VITAMIN D) 1000 UNITS tablet Take 1,000 Units by mouth daily.   Yes [provider]  gabapentin (NEURONTIN) 100 MG  capsule Take 100-200 mg by mouth 2 (two) times daily. Take one capsule in AM, 2 capsules at bedtime 12/17/14  Yes [provider]  omeprazole (PRILOSEC) 20 MG capsule Take 20 mg by mouth at bedtime.    Yes [provider]    Family History Family History  Problem Relation Age of Onset  . Cancer Brother   . Cancer Other     Social History Social History   Tobacco Use  . Smoking status: Never Smoker  . Smokeless tobacco: Never Used  Substance Use Topics  . Alcohol use: No  . Drug use: No     Allergies   Codeine and Meclizine   Review of Systems Review of Systems    Constitutional: Negative for chills and fever.  HENT: Negative for congestion, rhinorrhea and sore throat.   Eyes: Negative for redness and visual disturbance.  Respiratory: Positive for cough and shortness of breath.   Cardiovascular: Negative for chest pain and leg swelling.  Gastrointestinal: Negative for abdominal pain, diarrhea, nausea and vomiting.  Genitourinary: Negative for dysuria.  Musculoskeletal: Negative for back pain and neck pain.  Skin: Negative for rash.  Neurological: Negative for dizziness, light-headedness and headaches.  Hematological: Does not bruise/bleed easily.  Psychiatric/Behavioral: Negative for confusion.     Physical Exam Updated Vital Signs Pulse 73   Temp 98.7 F (37.1 C) (Oral)   Resp 20   Ht 1.549 m (5\' 1" )   Wt 80.7 kg   SpO2 97%   BMI 33.63 kg/m   Physical Exam Vitals signs and nursing note reviewed.  Constitutional:      General: She is not in acute distress.    Appearance: She is well-developed.  HENT:     Head: Normocephalic and atraumatic.     Mouth/Throat:     Mouth: Mucous membranes are moist.  Eyes:     Extraocular Movements: Extraocular movements intact.     Conjunctiva/sclera: Conjunctivae normal.     Pupils: Pupils are equal, round, and reactive to light.  Neck:     Musculoskeletal: Normal range of motion and neck supple.  Cardiovascular:     Rate and Rhythm: Tachycardia present. Rhythm irregular.     Heart sounds: Normal heart sounds. No murmur.  Pulmonary:     Effort: Pulmonary effort is normal. No respiratory distress.     Breath sounds: Wheezing and rhonchi present. No rales.  Abdominal:     Palpations: Abdomen is soft.     Tenderness: There is no abdominal tenderness.  Musculoskeletal: Normal range of motion.  Skin:    General: Skin is warm and dry.  Neurological:     General: No focal deficit present.     Mental Status: She is alert and oriented to person, place, and time.      ED Treatments / Results   Labs (all labs ordered are listed, but only abnormal results are displayed) Labs Reviewed  BRAIN NATRIURETIC PEPTIDE  CBC WITH DIFFERENTIAL/PLATELET  COMPREHENSIVE METABOLIC PANEL  PROTIME-INR    EKG EKG Interpretation  Date/Time:  Tuesday June 26 2018 21:21:36 EST Ventricular Rate:  113 PR Interval:    QRS Duration: 137 QT Interval:  384 QTC Calculation: 527 R Axis:   -17 Text Interpretation:  Atrial fibrillation Left bundle branch block Baseline wander in lead(s) II Confirmed by Fredia Sorrow [phone]) on 06/26/2018 10:03:43 PM   Radiology No results found.  Procedures Procedures (including critical care time)  CRITICAL CARE Performed by: Fredia Sorrow Total critical care time:  30 minutes Critical care time was exclusive of separately billable procedures and treating other patients. Critical care was necessary to treat or prevent imminent or life-threatening deterioration. Critical care was time spent personally by me on the following activities: development of treatment plan with patient and/or surrogate as well as nursing, discussions with consultants, evaluation of patient's response to treatment, examination of patient, obtaining history from patient or surrogate, ordering and performing treatments and interventions, ordering and review of laboratory studies, ordering and review of radiographic studies, pulse oximetry and re-evaluation of patient's condition.  Medications Ordered in ED Medications  0.9 %  sodium chloride infusion (has no administration in time range)  sodium chloride 0.9 % bolus 500 mL (has no administration in time range)  ipratropium-albuterol (DUONEB) 0.5-2.5 (3) MG/3ML nebulizer solution 3 mL (has no administration in time range)  diltiazem (CARDIZEM) 1 mg/mL load via infusion 10 mg (has no administration in time range)    And  diltiazem (CARDIZEM) 100 mg in dextrose 5 % 100 mL (1 mg/mL) infusion (has no administration in time range)      Initial Impression / Assessment and Plan / ED Course  I have reviewed the triage vital signs and the nursing notes.  Pertinent labs & imaging results that were available during my care of the patient were reviewed by me and considered in my medical decision making (see chart for details).    Patient's EKG and cardiac monitor consistent with atrial fibrillation.  With heart rate in the 120s.  Sometimes 130s.  No past history of atrial fib.  No prior EKG showing atrial fib.  Also today's EKG shows left bundle branch block.  No prior EKG showing that.  Patient without any chest pain.  Regular troponins pending.  Patient has a history of chronic lymphocytic leukemia since 2013.  This would explain her markedly elevated white blood cell count.  Electrolytes show mild hypokalemia with a potassium of 3.1.  BNP is elevated a little bit 600 range.  Chest x-ray shows some mild pulmonary congestion but no frank congestive heart failure or pulmonary edema.  Patient treated with another nebulizer treatment here breathing feels better cough improved.  Still having atrial fib but has improved a little bit on its own.  Will hold on the bolus and just go with low drip to try to get her heart rate down below 100.  Blood pressures been fine.  Do not think patient has influenza symptoms do not seem to be consistent with that.  Patient was brought in by EMS for shortness of breath and they noted that she had atrial fib on monitor.  Oxygen saturations have been 96% on room air.  Discussed with hospitalist who will admit for the new onset atrial fib.   Final Clinical Impressions(s) / ED Diagnoses   Final diagnoses:  Atrial fibrillation with RVR (HCC)  SOB (shortness of breath)    ED Discharge Orders    None       Fredia Sorrow, MD 06/26/18 2352

## 2018-06-26 NOTE — H&P (Signed)
History and Physical    [name]:[id number] DOB: 04/05/1936 DOA: 06/26/2018  PCP: Lemmie Evens, MD   Patient coming from: Home.  I have personally briefly reviewed patient's old medical records in [place]  Chief Complaint: Shortness of breath.  HPI: [name] is a 83 y.o. female with medical history significant of bowel obstruction, breast cancer, CLL, chronic bronchitis, migraine headaches who is coming to the emergency department with complaints of progressively worse shortness of breath for the past 2 days associated with cough, which is occasionally productive of thick white sputum and wheezing.    She states that earlier today she was in bed and was unable to lie on her back due to dyspnea.  She try to sleep on her left side and try to sleep on her right side, but eventually got up to sit in the recliner in the living room to bring her head up and be able to breathe better.  She went to the kitchen for a glass of water and return back to the recliner.  She got dyspneic from walking to the kitchen and back.  Once she was seated in the recliner, she started to persistently cough again.  She has been using her albuterol inhaler 2 puffs at least 4 times since the previous night, without significant relief of symptoms.  She subsequently called EMS.  EMS noticed that she was tachycardic with an irregular rhythm.  The patient denies palpitations, chest pain, dizziness, recent pitting edema of the lower extremities, nausea or emesis.  She denies fever, chills, sore throat, rhinorrhea, hemoptysis, abdominal pain, diarrhea, constipation, melena or hematochezia.  She has frequent urinary incontinence, but denies dysuria or hematuria.  No polyuria, polydipsia, polyphagia or blurred vision.  She denies skin rashes.  She states that she has been under significant stress lately, particularly since she had to euthanize her rescue dog of 8 years that was suffering from heart failure  this past Friday.  ED Course: Temperature was 98.7, pulse 73, respirations 20, blood pressure 113/100 mmHg and O2 sat 97% on room air.  The patient was given a DuoNeb and was started on a Cardizem infusion in the emergency department.  I ordered KDur 40 mEq p.o., magnesium sulfate 2 g IVPB, Solu-Medrol 125 mg IVP x1, started lev albuterol and heparin per pharmacy.  I subsequently discontinued the diltiazem infusion since her heart rate was already decreasing prior to the infusion starting.  Metoprolol 5 mg IVP given, which resulted in heart rate control.  White count was 39.8, with 81% lymphocytes, 14% neutrophils and 4% monocytes.  Hemoglobin 12.1 g/dL and platelets 234.  PT/INR/PTT are within normal parameters.  Troponin was normal.  BNP was 669.0 pg/mL.  CMP shows a potassium of 3.1 mmol/L.  Glucose is 121 and calcium 8.6 mg/dL.  All other CMP values are within normal limits.  Magnesium was 2.1 and phosphorus 3.3 mg/dL.  EKG showed atrial fibrillation with new LBBB.  Chest radiograph show cardiomegaly with mild pulmonary vascular congestion.  Review of Systems: As per HPI otherwise 10 point review of systems negative.   Past Medical History:  Diagnosis Date  . Bowel obstruction ([place])   . Breast cancer ([place])   . Breast cancer, stage 3 ([place]) 07/21/2011  . Bronchitis   . CLL (chronic lymphocytic leukemia) ([place]) 07/21/2011  . Migraines     Past Surgical History:  Procedure Laterality Date  . ABDOMINAL HYSTERECTOMY    . APPENDECTOMY    . CHOLECYSTECTOMY    .  LAPAROTOMY N/A 06/08/2015   Procedure: EXPLORATORY LAPAROTOMY;  Surgeon: Aviva Signs, MD;  Location: [facility];  Service: General;  Laterality: N/A;  . LYSIS OF ADHESION N/A 06/08/2015   Procedure: LYSIS OF ADHESION;  Surgeon: Aviva Signs, MD;  Location: [facility];  Service: General;  Laterality: N/A;  . MASTECTOMY     right  . TOTAL HIP ARTHROPLASTY     right     reports that she has never smoked. She has never used smokeless tobacco. She  reports that she does not drink alcohol or use drugs.  Allergies  Allergen Reactions  . Codeine Nausea And Vomiting    headache  . Meclizine Other (See Comments)    Made dizziness worse.    Family History  Problem Relation Age of Onset  . Cancer Brother   . Cancer Other    Prior to Admission medications   Medication Sig Start Date End Date Taking? Authorizing Provider  albuterol (PROVENTIL HFA;VENTOLIN HFA) 108 (90 BASE) MCG/ACT inhaler Inhale 2 puffs into the lungs every 6 (six) hours as needed for shortness of breath.    Yes [provider]  albuterol (PROVENTIL) (2.5 MG/3ML) 0.083% nebulizer solution Take 2.5 mg by nebulization every 4 (four) hours as needed for wheezing or shortness of breath.   Yes [provider]  aspirin 325 MG tablet Take 325 mg by mouth daily.    Yes [provider]  cholecalciferol (VITAMIN D) 1000 UNITS tablet Take 1,000 Units by mouth daily.   Yes [provider]  gabapentin (NEURONTIN) 100 MG capsule Take 100-200 mg by mouth 2 (two) times daily. Take one capsule in AM, 2 capsules at bedtime 12/17/14  Yes [provider]  omeprazole (PRILOSEC) 20 MG capsule Take 20 mg by mouth at bedtime.    Yes [provider]    Physical Exam: Vitals:   06/26/18 2245 06/26/18 2300 06/26/18 2328 06/26/18 2330  BP:   125/65   Pulse: (!) 58 93 (!) 102   Resp: (!) 27 (!) 26 (!) 24   Temp:      TempSrc:      SpO2: 91% 95% 94% 92%  Weight:      Height:        Constitutional: NAD, calm, comfortable Eyes: PERRL, lids and conjunctivae normal ENMT: Mucous membranes are moist. Posterior pharynx clear of any exudate or lesions. Neck: normal, supple, no masses, no thyromegaly. Respiratory: Decreased breath sounds with bilateral rhonchi and wheezing, no crackles. Normal respiratory effort. No accessory muscle use.  Cardiovascular: Irregularly irregular at 106 bpm, no murmurs / rubs / gallops. No extremity edema. 2+ pedal  pulses. No carotid bruits.  Abdomen:, Obese, soft, no tenderness, no masses palpated. No hepatosplenomegaly. Bowel sounds positive.  Musculoskeletal: no clubbing / cyanosis. No joint deformity upper and lower extremities. Good ROM, no contractures. Normal muscle tone.  Skin: no rashes, lesions, ulcers. No induration on limited dermatological examination. Neurologic: CN 2-12 grossly intact. Sensation intact, DTR normal. Strength 5/5 in all 4.  Psychiatric: Normal judgment and insight. Alert and oriented x 3. Normal mood.   Labs on Admission: I have personally reviewed following labs and imaging studies  CBC: Recent Labs  Lab 06/26/18 2234  WBC 39.8*  NEUTROABS 5.7  HGB 12.1  HCT 39.6  MCV 92.3  PLT 973   Basic Metabolic Panel: Recent Labs  Lab 06/26/18 2234  NA 140  K 3.1*  CL 105  CO2 25  GLUCOSE 121*  BUN 16  CREATININE  0.73  CALCIUM 8.6*   GFR: Estimated Creatinine Clearance: 53.1 mL/min (by C-G formula based on SCr of 0.73 mg/dL). Liver Function Tests: Recent Labs  Lab 06/26/18 2234  AST 22  ALT 16  ALKPHOS 48  BILITOT 0.5  PROT 6.7  ALBUMIN 4.0   No results for input(s): LIPASE, AMYLASE in the last 168 hours. No results for input(s): AMMONIA in the last 168 hours. Coagulation Profile: Recent Labs  Lab 06/26/18 2234  INR 1.13   Cardiac Enzymes: Recent Labs  Lab 06/26/18 2234  TROPONINI <0.03   BNP (last 3 results) No results for input(s): PROBNP in the last 8760 hours. HbA1C: No results for input(s): HGBA1C in the last 72 hours. CBG: No results for input(s): GLUCAP in the last 168 hours. Lipid Profile: No results for input(s): CHOL, HDL, LDLCALC, TRIG, CHOLHDL, LDLDIRECT in the last 72 hours. Thyroid Function Tests: No results for input(s): TSH, T4TOTAL, FREET4, T3FREE, THYROIDAB in the last 72 hours. Anemia Panel: No results for input(s): VITAMINB12, FOLATE, FERRITIN, TIBC, IRON, RETICCTPCT in the last 72 hours. Urine analysis:      Component Value Date/Time   COLORURINE YELLOW 06/05/2015 [place] 06/05/2015 1303   LABSPEC >1.030 (H) 06/05/2015 1303   PHURINE 5.5 06/05/2015 1303   GLUCOSEU NEGATIVE 06/05/2015 1303   HGBUR MODERATE (A) 06/05/2015 1303   BILIRUBINUR NEGATIVE 06/05/2015 1303   KETONESUR TRACE (A) 06/05/2015 1303   PROTEINUR TRACE (A) 06/05/2015 1303   UROBILINOGEN 0.2 10/17/2008 1128   NITRITE NEGATIVE 06/05/2015 1303   LEUKOCYTESUR NEGATIVE 06/05/2015 1303    Radiological Exams on Admission: Dg Chest 2 View  Result Date: 06/26/2018 CLINICAL DATA:  Cough, shortness of breath EXAM: CHEST - 2 VIEW COMPARISON:  06/08/2015 FINDINGS: Bilateral mild interstitial thickening. No focal consolidation, pleural effusion or pneumothorax. Stable cardiomegaly. No acute osseous abnormality. IMPRESSION: Cardiomegaly with mild pulmonary vascular congestion. Electronically Signed   By: Kathreen Devoid   On: 06/26/2018 23:16    EKG: Independently reviewed.  Vent. rate 113 BPM PR interval * ms QRS duration 137 ms QT/QTc 384/527 ms P-R-T axes * -17 50 Atrial fibrillation Left bundle branch block Baseline wander in lead(s) II  Assessment/Plan Principal Problem:   Atrial fibrillation with RVR (HCC) Observation/telemetry. Continue supplemental oxygen. Responded very well to metoprolol. Continue metoprolol succinate 25 mg p.o. daily. Metoprolol tartrate 5 mg IVP q 6 hr PRN for tachycardia/hypertension. Keep electrolytes optimized. Trend troponin level. Check TSH. Check morning EKG. Obtain echocardiogram. Consult cardiology.  Active Problems:   COPD with acute exacerbation (HCC) Continue supplemental oxygen. Dose Solu-Medrol 125 mg IVP given. Levalbuterol nebs every 6 hours.    Hyperlipidemia  Not on medical therapy. Should follow-up with PCP. Outpatient fasting lipid panel.    OSA (obstructive sleep apnea) States she does not need or use CPAP anymore.    CLL (chronic lymphocytic  leukemia) (HCC) Monitor WBC. Continue schedule follow-ups with hematology.    Hyperglycemia Check hemoglobin A1c.   DVT prophylaxis: On heparin infusion. Code Status: Full code. Family Communication: Disposition Plan: Consults called: Routine cardiology consult. Admission status: Inpatient/SDU.   Reubin Milan MD [facility]  06/26/2018, 11:59 PM    This document was prepared using Dragon voice recognition software and may contain some unintended transcription errors.

## 2018-06-26 NOTE — ED Triage Notes (Signed)
Patient called [facility] for shortness of breath. afib on monitor with EMS, irregular hear rate.

## 2018-06-26 NOTE — ED Notes (Signed)
Dr. Zackowski at bedside  

## 2018-06-27 ENCOUNTER — Observation Stay (HOSPITAL_BASED_OUTPATIENT_CLINIC_OR_DEPARTMENT_OTHER): Payer: Medicare HMO

## 2018-06-27 ENCOUNTER — Encounter (HOSPITAL_COMMUNITY): Payer: Self-pay | Admitting: Internal Medicine

## 2018-06-27 ENCOUNTER — Other Ambulatory Visit: Payer: Self-pay

## 2018-06-27 DIAGNOSIS — G4733 Obstructive sleep apnea (adult) (pediatric): Secondary | ICD-10-CM

## 2018-06-27 DIAGNOSIS — I4891 Unspecified atrial fibrillation: Secondary | ICD-10-CM | POA: Diagnosis not present

## 2018-06-27 DIAGNOSIS — J441 Chronic obstructive pulmonary disease with (acute) exacerbation: Secondary | ICD-10-CM | POA: Diagnosis not present

## 2018-06-27 DIAGNOSIS — R739 Hyperglycemia, unspecified: Secondary | ICD-10-CM | POA: Diagnosis not present

## 2018-06-27 DIAGNOSIS — J449 Chronic obstructive pulmonary disease, unspecified: Secondary | ICD-10-CM | POA: Diagnosis present

## 2018-06-27 DIAGNOSIS — I361 Nonrheumatic tricuspid (valve) insufficiency: Secondary | ICD-10-CM | POA: Diagnosis not present

## 2018-06-27 DIAGNOSIS — C9110 Chronic lymphocytic leukemia of B-cell type not having achieved remission: Secondary | ICD-10-CM | POA: Diagnosis not present

## 2018-06-27 DIAGNOSIS — E785 Hyperlipidemia, unspecified: Secondary | ICD-10-CM

## 2018-06-27 LAB — CBC
HCT: 37.5 % (ref 36.0–46.0)
Hemoglobin: 11.6 g/dL — ABNORMAL LOW (ref 12.0–15.0)
MCH: 28.7 pg (ref 26.0–34.0)
MCHC: 30.9 g/dL (ref 30.0–36.0)
MCV: 92.8 fL (ref 80.0–100.0)
Platelets: 215 10*3/uL (ref 150–400)
RBC: 4.04 MIL/uL (ref 3.87–5.11)
RDW: 14.5 % (ref 11.5–15.5)
WBC: 36.6 10*3/uL — AB (ref 4.0–10.5)
nRBC: 0.1 % (ref 0.0–0.2)

## 2018-06-27 LAB — TROPONIN I: Troponin I: <0.03 ng/mL (ref <0.03)

## 2018-06-27 LAB — HEMOGLOBIN A1C
Hgb A1c MFr Bld: 6.3 % — ABNORMAL HIGH (ref 4.8–5.6)
Mean Plasma Glucose: 134.11 mg/dL

## 2018-06-27 LAB — ECHOCARDIOGRAM COMPLETE
Height: 61 in
Weight: 2864.22 oz

## 2018-06-27 LAB — MAGNESIUM: Magnesium: 2.1 mg/dL (ref 1.7–2.4)

## 2018-06-27 LAB — TSH: TSH: 1.322 u[IU]/mL (ref 0.350–4.500)

## 2018-06-27 LAB — PHOSPHORUS: Phosphorus: 3.3 mg/dL (ref 2.5–4.6)

## 2018-06-27 LAB — APTT: aPTT: 29 seconds (ref 24–36)

## 2018-06-27 MED ORDER — POTASSIUM CHLORIDE CRYS ER 20 MEQ PO TBCR
40.0000 meq | EXTENDED_RELEASE_TABLET | Freq: Once | ORAL | Status: AC
  Administered 2018-06-27: 40 meq via ORAL
  Filled 2018-06-27: qty 2

## 2018-06-27 MED ORDER — PREDNISONE 10 MG PO TABS: 30.0000 mg | ORAL_TABLET | Freq: Every day | ORAL | 0 refills | Status: AC

## 2018-06-27 MED ORDER — METOPROLOL TARTRATE 5 MG/5ML IV SOLN
5.0000 mg | Freq: Once | INTRAVENOUS | Status: AC
  Administered 2018-06-27: 5 mg via INTRAVENOUS
  Filled 2018-06-27: qty 5

## 2018-06-27 MED ORDER — ALPRAZOLAM 0.5 MG PO TABS: 0.5000 mg | ORAL_TABLET | Freq: Every evening | ORAL | Status: DC | PRN

## 2018-06-27 MED ORDER — MAGNESIUM SULFATE 2 GM/50ML IV SOLN
2.0000 g | Freq: Once | INTRAVENOUS | Status: AC
  Administered 2018-06-27: 2 g via INTRAVENOUS
  Filled 2018-06-27: qty 50

## 2018-06-27 MED ORDER — LEVALBUTEROL HCL 0.63 MG/3ML IN NEBU
0.6300 mg | INHALATION_SOLUTION | Freq: Four times a day (QID) | RESPIRATORY_TRACT | Status: DC
  Administered 2018-06-27: 0.63 mg via RESPIRATORY_TRACT
  Filled 2018-06-27: qty 3

## 2018-06-27 MED ORDER — HEPARIN BOLUS VIA INFUSION
3300.0000 [IU] | Freq: Once | INTRAVENOUS | Status: AC
  Administered 2018-06-27: 3300 [IU] via INTRAVENOUS

## 2018-06-27 MED ORDER — METOPROLOL SUCCINATE ER 25 MG PO TB24: 25.0000 mg | ORAL_TABLET | Freq: Every day | ORAL | 1 refills | Status: DC

## 2018-06-27 MED ORDER — LEVALBUTEROL HCL 1.25 MG/0.5ML IN NEBU
1.2500 mg | INHALATION_SOLUTION | Freq: Four times a day (QID) | RESPIRATORY_TRACT | Status: DC
  Administered 2018-06-27: 1.25 mg via RESPIRATORY_TRACT
  Filled 2018-06-27: qty 0.5

## 2018-06-27 MED ORDER — ALBUTEROL SULFATE (2.5 MG/3ML) 0.083% IN NEBU: 2.5000 mg | INHALATION_SOLUTION | Freq: Four times a day (QID) | RESPIRATORY_TRACT | 12 refills | Status: DC | PRN

## 2018-06-27 MED ORDER — METHYLPREDNISOLONE SODIUM SUCC 125 MG IJ SOLR
125.0000 mg | Freq: Once | INTRAMUSCULAR | Status: AC
  Administered 2018-06-27: 125 mg via INTRAVENOUS
  Filled 2018-06-27: qty 2

## 2018-06-27 MED ORDER — METHYLPREDNISOLONE SODIUM SUCC 40 MG IJ SOLR
40.0000 mg | Freq: Once | INTRAMUSCULAR | Status: AC
  Administered 2018-06-27: 40 mg via INTRAVENOUS
  Filled 2018-06-27: qty 1

## 2018-06-27 MED ORDER — ACETAMINOPHEN 325 MG PO TABS
650.0000 mg | ORAL_TABLET | Freq: Four times a day (QID) | ORAL | Status: DC | PRN
  Administered 2018-06-27: 650 mg via ORAL
  Filled 2018-06-27: qty 2

## 2018-06-27 MED ORDER — GABAPENTIN 100 MG PO CAPS
100.0000 mg | ORAL_CAPSULE | Freq: Every day | ORAL | Status: DC
  Administered 2018-06-27: 100 mg via ORAL
  Filled 2018-06-27: qty 1

## 2018-06-27 MED ORDER — HEPARIN (PORCINE) 25000 UT/250ML-% IV SOLN
950.0000 [IU]/h | INTRAVENOUS | Status: DC
  Administered 2018-06-27: 950 [IU]/h via INTRAVENOUS
  Filled 2018-06-27: qty 250

## 2018-06-27 MED ORDER — METOPROLOL SUCCINATE ER 25 MG PO TB24
25.0000 mg | ORAL_TABLET | Freq: Every day | ORAL | Status: DC
  Administered 2018-06-27: 25 mg via ORAL
  Filled 2018-06-27: qty 1

## 2018-06-27 MED ORDER — PANTOPRAZOLE SODIUM 40 MG PO TBEC
40.0000 mg | DELAYED_RELEASE_TABLET | Freq: Every day | ORAL | Status: DC
  Administered 2018-06-27: 40 mg via ORAL
  Filled 2018-06-27: qty 1

## 2018-06-27 MED ORDER — SODIUM CHLORIDE 0.9 % IN NEBU
INHALATION_SOLUTION | RESPIRATORY_TRACT | Status: AC
  Administered 2018-06-27: 3 mL
  Filled 2018-06-27: qty 3

## 2018-06-27 MED ORDER — GABAPENTIN 100 MG PO CAPS: 200.0000 mg | ORAL_CAPSULE | Freq: Every day | ORAL | Status: DC

## 2018-06-27 MED ORDER — METOPROLOL TARTRATE 5 MG/5ML IV SOLN: 5.0000 mg | INTRAVENOUS | Status: DC | PRN

## 2018-06-27 NOTE — ED Notes (Signed)
ED TO INPATIENT HANDOFF REPORT  Name/Age/Gender [name] 83 y.o. female  Code Status    Code Status Orders  (From admission, onward)         Start     Ordered   06/27/18 0014  Full code  Continuous     06/27/18 0014        Code Status History    Date Active Date Inactive Code Status Order ID Comments User Context   06/08/2015 1647 06/12/2015 1457 Full Code [id number]  Aviva Signs, MD Inpatient   06/05/2015 1550 06/08/2015 1647 Full Code [id number]  Vianne Bulls, MD ED      Home/SNF/Other Home  Chief Complaint Shortness of Breath  Level of Care/Admitting Diagnosis ED Disposition    ED Disposition Condition [place]: [facility] [527782]  Level of Care: Telemetry [5]  Diagnosis: Atrial fibrillation with RVR [facility]) [423536]  Admitting Physician: Reubin Milan [[id number]]  Attending Physician: Reubin Milan [[id number]]  PT Class (Do Not Modify): Observation [104]  PT Acc Code (Do Not Modify): Observation [10022]       Medical History Past Medical History:  Diagnosis Date  . Bowel obstruction ([place])   . Breast cancer ([place])   . Breast cancer, stage 3 ([place]) 07/21/2011  . Bronchitis   . CLL (chronic lymphocytic leukemia) ([place]) 07/21/2011  . Migraines     Allergies Allergies  Allergen Reactions  . Codeine Nausea And Vomiting    headache  . Meclizine Other (See Comments)    Made dizziness worse.    IV Location/Drains/Wounds Patient Lines/Drains/Airways Status   Active Line/Drains/Airways    Name:   Placement date:   Placement time:   Site:   Days:   Peripheral IV 06/27/18 Left Hand   06/27/18    0031    Hand   less than 1   NG/OG Tube 16 Fr.   -    0000    -      NG/OG Tube Nasogastric 16 Fr. Right nare   06/08/15    1450    Right nare   1115   Airway 7 mm   06/08/15    1549     1115   Incision (Closed) 06/08/15 Abdomen Other (Comment)   06/08/15    1446     1115          Labs/Imaging Results for orders  placed or performed during the hospital encounter of 06/26/18 (from the past 48 hour(s))  Brain natriuretic peptide     Status: Abnormal   Collection Time: 06/26/18 10:34 PM  Result Value Ref Range   B Natriuretic Peptide 669.0 (H) 0.0 - 100.0 pg/mL    Comment: Performed at [facility], [address], [place], [place] [address]  CBC with Differential/Platelet     Status: Abnormal   Collection Time: 06/26/18 10:34 PM  Result Value Ref Range   WBC 39.8 (H) 4.0 - 10.5 K/uL   RBC 4.29 3.87 - 5.11 MIL/uL   Hemoglobin 12.1 12.0 - 15.0 g/dL   HCT 39.6 36.0 - 46.0 %   MCV 92.3 80.0 - 100.0 fL   MCH 28.2 26.0 - 34.0 pg   MCHC 30.6 30.0 - 36.0 g/dL   RDW 14.6 11.5 - 15.5 %   Platelets 234 150 - 400 K/uL   nRBC 0.1 0.0 - 0.2 %   Neutrophils Relative % 14 %   Neutro Abs 5.7 1.7 - 7.7 K/uL  Lymphocytes Relative 81 %   Lymphs Abs 32.1 (H) 0.7 - 4.0 K/uL   Monocytes Relative 4 %   Monocytes Absolute 1.5 (H) 0.1 - 1.0 K/uL   Eosinophils Relative 1 %   Eosinophils Absolute 0.3 0.0 - 0.5 K/uL   Basophils Relative 0 %   Basophils Absolute 0.2 (H) 0.0 - 0.1 K/uL    Comment: Performed at [facility], [address] [place], [place] [address]  Comprehensive metabolic panel     Status: Abnormal   Collection Time: 06/26/18 10:34 PM  Result Value Ref Range   Sodium 140 135 - 145 mmol/L   Potassium 3.1 (L) 3.5 - 5.1 mmol/L   Chloride 105 98 - 111 mmol/L   CO2 25 22 - 32 mmol/L   Glucose, Bld 121 (H) 70 - 99 mg/dL   BUN 16 8 - 23 mg/dL   Creatinine, Ser 0.73 0.44 - 1.00 mg/dL   Calcium 8.6 (L) 8.9 - 10.3 mg/dL   Total Protein 6.7 6.5 - 8.1 g/dL   Albumin 4.0 3.5 - 5.0 g/dL   AST 22 15 - 41 U/L   ALT 16 0 - 44 U/L   Alkaline Phosphatase 48 38 - 126 U/L   Total Bilirubin 0.5 0.3 - 1.2 mg/dL   GFR calc non Af Amer >60 >60 mL/min   GFR calc Af Amer >60 >60 mL/min   Anion gap 10 5 - 15    Comment: Performed at [facility], [address], [place], [place] [address]  Protime-INR     Status: None    Collection Time: 06/26/18 10:34 PM  Result Value Ref Range   Prothrombin Time 14.4 11.4 - 15.2 seconds   INR 1.13     Comment: Performed at [facility], [address] [place], [place] [address]  Troponin I - Once     Status: None   Collection Time: 06/26/18 10:34 PM  Result Value Ref Range   Troponin I <0.03 <0.03 ng/mL    Comment: Performed at [facility], [address] [place], [place] [address]  Magnesium     Status: None   Collection Time: 06/26/18 10:34 PM  Result Value Ref Range   Magnesium 2.1 1.7 - 2.4 mg/dL    Comment: Performed at [facility], [address], [place], [place] [address]  Phosphorus     Status: None   Collection Time: 06/26/18 10:34 PM  Result Value Ref Range   Phosphorus 3.3 2.5 - 4.6 mg/dL    Comment: Performed at [facility], [address] [place], [place] [address]  APTT     Status: None   Collection Time: 06/26/18 10:34 PM  Result Value Ref Range   aPTT 29 24 - 36 seconds    Comment: Performed at [facility], [address] [place], [place] [address]   Dg Chest 2 View  Result Date: 06/26/2018 CLINICAL DATA:  Cough, shortness of breath EXAM: CHEST - 2 VIEW COMPARISON:  06/08/2015 FINDINGS: Bilateral mild interstitial thickening. No focal consolidation, pleural effusion or pneumothorax. Stable cardiomegaly. No acute osseous abnormality. IMPRESSION: Cardiomegaly with mild pulmonary vascular congestion. Electronically Signed   By: Kathreen Devoid   On: 06/26/2018 23:16    Pending Labs Unresulted Labs (From admission, onward)    Start     Ordered   06/27/18 0900  Heparin level (unfractionated)  Once,   R     06/27/18 0054   06/27/18 0400  Troponin I - Now Then Q6H  Now then every  6 hours,   R     06/27/18 0015          Vitals/Pain Today's Vitals   06/27/18 0100 06/27/18 0130 06/27/18 0152 06/27/18 0157  BP: 118/65 108/83    Pulse: 82 78    Resp: 18 19    Temp:      TempSrc:      SpO2: 95% 97%  96%  Weight:      Height:      PainSc:    0-No pain     Isolation Precautions No active isolations  Medications Medications  diltiazem (CARDIZEM) 1 mg/mL load via infusion 10 mg (10 mg Intravenous Not Given 06/26/18 2344)    And  diltiazem (CARDIZEM) 100 mg in dextrose 5 % 100 mL (1 mg/mL) infusion (0 mg/hr Intravenous Stopped 06/27/18 0125)  heparin ADULT infusion 100 units/mL (25000 units/274mL sodium chloride 0.45%) (950 Units/hr Intravenous Transfusing/Transfer 06/27/18 0228)  levalbuterol (XOPENEX) nebulizer solution 1.25 mg (1.25 mg Nebulization Given 06/27/18 0154)  ALPRAZolam (XANAX) tablet 0.5 mg (has no administration in time range)  metoprolol succinate (TOPROL-XL) 24 hr tablet 25 mg (has no administration in time range)  metoprolol tartrate (LOPRESSOR) injection 5 mg (has no administration in time range)  ipratropium-albuterol (DUONEB) 0.5-2.5 (3) MG/3ML nebulizer solution 3 mL (3 mLs Nebulization Given 06/26/18 2330)  potassium chloride SA (K-DUR,KLOR-CON) CR tablet 40 mEq (40 mEq Oral Given 06/27/18 0039)  magnesium sulfate IVPB 2 g 50 mL (0 g Intravenous Stopped 06/27/18 0135)  heparin bolus via infusion 3,300 Units (3,300 Units Intravenous Bolus from Bag 06/27/18 0149)  metoprolol tartrate (LOPRESSOR) injection 5 mg (5 mg Intravenous Given 06/27/18 0125)  methylPREDNISolone sodium succinate (SOLU-MEDROL) 125 mg/2 mL injection 125 mg (125 mg Intravenous Given 06/27/18 0141)  sodium chloride 0.9 % nebulizer solution (3 mLs  Given 06/27/18 0158)    Mobility walks

## 2018-06-27 NOTE — Discharge Summary (Signed)
Physician Discharge Summary  [name] WJX:[id number] DOB: 11/22/1935 DOA: 06/26/2018  PCP: Lemmie Evens, MD  Admit date: 06/26/2018 Discharge date: 06/27/2018  Time spent: 30 minutes  Recommendations for Outpatient Follow-up:  1. Repeat CBC to assess WBCs and stability 2. -Repeat basic metabolic panel to follow electrolytes and renal function 3. Close follow-up the patient CBGs and A1c as an outpatient as she has reached category of prediabetes and presented with hypoglycemia.  No hypoglycemic agents have been started at discharge. 4. -Repeat lipid panel and determine the need of statins regimen.   Discharge Diagnoses:  Principal Problem:   Atrial fibrillation with RVR ([place]) Active Problems:   Hyperlipidemia   OSA (obstructive sleep apnea)   CLL (chronic lymphocytic leukemia) (HCC)   Hyperglycemia   COPD with acute exacerbation (HCC)   Discharge Condition: Stable and improved.  Patient discharged home with instructions to follow-up with PCP in 10 days and to follow-up with cardiology service as instructed.  Diet recommendation: Heart healthy diet and modify carbohydrates.  Filed Weights   06/26/18 2119 06/27/18 0326  Weight: 80.7 kg 81.2 kg    Brief History of present illness:  83 y.o. female with medical history significant of bowel obstruction, breast cancer, CLL, chronic bronchitis, migraine headaches who is coming to the emergency department with complaints of progressively worse shortness of breath for the past 2 days associated with cough, which is occasionally productive of thick white sputum and wheezing.  Patient also expressed experiencing some orthopnea symptoms and on presentation to ED was found to be on A. fib with RVR.  Please refer to H&P written by Dr. Olevia Bowens on 04/26/2019 for further info/details on admission.  Hospital Course:  1-A. fib with RVR on presentation: Patient back to sinus rhythm at discharge; categorizing her with paroxysmal A. fib. -This is  the first episode where we have been able to isolate atrial fibrillation. -Patient has had a history of PACs and PVCs in the past -Discussed with cardiology services who has recommended discharge home on metoprolol and to use aspirin; They will schedule outpatient event monitoring and make further decisions to further assess underlying issues with arrhythmia. -Electrolytes within normal limits at discharge.  2-COPD with acute exacerbation -No oxygen required at discharge -Patient will complete steroids tapering as an outpatient and will use as needed nebulizer/inhaler regimen  3-hyperlipidemia -Outpatient follow-up with PCP to assess lipid panel and make further decisions regarding statins use if needed.  4-obstructive sleep apnea -Patient stated that she does not need or use CPAP anymore.  5-CLL -Appears to be stable -Continue outpatient follow-up with hematology service.  6-hyperglycemia -A1C 6.3 -Patient in the range of prediabetes -Modified carbohydrate has been recommended -Advised to keep herself well-hydrated -Outpatient follow-up with PCP to further assess A1c and CBGs fluctuation.  7-GERD -Continue PPI.  Procedures:  See below for x-ray reports   2-D echo  Consultations:  Cardiology curbside (discussed with PA and Dr. Domenic Polite, recommended discharge home metoprolol and aspirin with outpatient follow-up for event monitoring and further decisions on treatment of paroxysmal atrial fibrillation).  Discharge Exam: Vitals:   06/27/18 1347 06/27/18 1439  BP:  (!) 115/54  Pulse:  62  Resp:  18  Temp:    SpO2: 94% 92%    General: Afebrile, no chest pain, no nausea, no vomiting.  Patient denies palpitations, shortness of breath and orthopnea. Cardiovascular: S1 and S2, no rubs, no gallops, no JVD. Respiratory: No use of accessory muscles, no crackles on exam.  Very mild  expiratory wheezing on auscultation.  Normal respiratory effort. Abdomen: Soft, nontender,  nondistended, positive bowel sounds Extremities: Trace edema bilaterally, no cyanosis, no clubbing.  Discharge Instructions   Discharge Instructions    Diet - low sodium heart healthy   Complete by:  As directed    Discharge instructions   Complete by:  As directed    Follow heart healthy diet Take medications as prescribed Follow-up with cardiology service as instructed (they will contact you with appointment details) Complete steroid tapering and resume as needed inhaler/nebulizer management     Allergies as of 06/27/2018      Reactions   Codeine Nausea And Vomiting   headache   Meclizine Other (See Comments)   Made dizziness worse.      Medication List    TAKE these medications   albuterol 108 (90 Base) MCG/ACT inhaler Commonly known as:  PROVENTIL HFA;VENTOLIN HFA Inhale 2 puffs into the lungs every 6 (six) hours as needed for shortness of breath. What changed:  Another medication with the same name was changed. Make sure you understand how and when to take each.   albuterol (2.5 MG/3ML) 0.083% nebulizer solution Commonly known as:  PROVENTIL Take 3 mLs (2.5 mg total) by nebulization every 6 (six) hours as needed for wheezing or shortness of breath (when not relieved by inhaler.). What changed:    when to take this  reasons to take this   aspirin 325 MG tablet Take 325 mg by mouth daily.   cholecalciferol 1000 units tablet Commonly known as:  VITAMIN D Take 1,000 Units by mouth daily.   gabapentin 100 MG capsule Commonly known as:  NEURONTIN Take 100-200 mg by mouth 2 (two) times daily. Take one capsule in AM, 2 capsules at bedtime   metoprolol succinate 25 MG 24 hr tablet Commonly known as:  TOPROL-XL Take 1 tablet (25 mg total) by mouth daily. Start taking on:  June 28, 2018   omeprazole 20 MG capsule Commonly known as:  PRILOSEC Take 20 mg by mouth at bedtime.   predniSONE 10 MG tablet Commonly known as:  DELTASONE Take 3 tablets (30 mg total)  by mouth daily with breakfast for 5 days.      Allergies  Allergen Reactions  . Codeine Nausea And Vomiting    headache  . Meclizine Other (See Comments)    Made dizziness worse.   Follow-up Information    Erma Heritage, PA-C Follow up on 07/17/2018.   Specialties:  Physician Assistant, Cardiology Why:  Cardiology Hospital Follow-up on 07/17/2018 at 2:30 PM with Bernerd Pho, PA-C (works with Dr. Bronson Ing).  Contact information: [place] [address] [phone]        Lemmie Evens, MD. Schedule an appointment as soon as possible for a visit.   Specialty:  Family Medicine Contact information: [place] [address] [phone]        Herminio Commons, MD .   Specialty:  Cardiology Contact information: [place] [address] [phone]           The results of significant diagnostics from this hospitalization (including imaging, microbiology, ancillary and laboratory) are listed below for reference.    Significant Diagnostic Studies: Dg Chest 2 View  Result Date: 06/26/2018 CLINICAL DATA:  Cough, shortness of breath EXAM: CHEST - 2 VIEW COMPARISON:  06/08/2015 FINDINGS: Bilateral mild interstitial thickening. No focal consolidation, pleural effusion or pneumothorax. Stable cardiomegaly. No acute osseous abnormality. IMPRESSION: Cardiomegaly with  mild pulmonary vascular congestion. Electronically Signed   By: Kathreen Devoid   On: 06/26/2018 23:16   Labs: Basic Metabolic Panel: Recent Labs  Lab 06/26/18 2234  NA 140  K 3.1*  CL 105  CO2 25  GLUCOSE 121*  BUN 16  CREATININE 0.73  CALCIUM 8.6*  MG 2.1  PHOS 3.3   Liver Function Tests: Recent Labs  Lab 06/26/18 2234  AST 22  ALT 16  ALKPHOS 48  BILITOT 0.5  PROT 6.7  ALBUMIN 4.0   CBC: Recent Labs  Lab 06/26/18 2234 06/27/18 0517  WBC 39.8* 36.6*  NEUTROABS 5.7  --   HGB 12.1 11.6*  HCT 39.6 37.5  MCV 92.3 92.8  PLT 234 215    Cardiac Enzymes: Recent Labs  Lab 06/26/18 2234 06/27/18 0517 06/27/18 1117  TROPONINI <0.03 <0.03 <0.03   BNP: BNP (last 3 results) Recent Labs    06/26/18 2234  BNP 669.0*    Signed:  Barton Dubois MD.  [facility] 06/27/2018, 3:43 PM

## 2018-06-27 NOTE — ED Notes (Signed)
Dr Ortiz at bedside 

## 2018-06-27 NOTE — Care Management Obs Status (Signed)
[facility] NOTIFICATION   Patient Details  Name: [name] MRN: [id number] Date of Birth: Nov 27, 1935   Medicare Observation Status Notification Given:  Yes    Sherald Barge, RN 06/27/2018, 1:19 PM

## 2018-06-27 NOTE — ED Notes (Addendum)
Pt HR ranges from 90-110- Dr Rogene Houston made aware- verbal order to d/c Cardizem bolus and IV NS bolus.

## 2018-06-27 NOTE — Care Management CC44 (Signed)
Condition Code 44 Documentation Completed  Patient Details  Name: NICIE MILAN MRN: [id number] Date of Birth: April 04, 1936   Condition Code 44 given:  Yes Patient signature on Condition Code 44 notice:  Yes Documentation of 2 MD's agreement:  Yes Code 44 added to claim:  Yes    Sherald Barge, RN 06/27/2018, 1:19 PM

## 2018-06-27 NOTE — Progress Notes (Signed)
ANTICOAGULATION CONSULT NOTE - Preliminary  Pharmacy Consult for Heparin Indication: Atrial fibrillation  Allergies  Allergen Reactions  . Codeine Nausea And Vomiting    headache  . Meclizine Other (See Comments)    Made dizziness worse.    Patient Measurements: Height: 5\' 1"  (154.9 cm) Weight: 178 lb (80.7 kg) IBW/kg (Calculated) : 47.8 HEPARIN DW (KG): 66   Vital Signs: Temp: 98.7 F (37.1 C) (02/04 2115) Temp Source: Oral (02/04 2115) BP: 128/86 (02/05 0030) Pulse Rate: 129 (02/05 0030)  Labs: Recent Labs    06/26/18 2234  HGB 12.1  HCT 39.6  PLT 234  LABPROT 14.4  INR 1.13  CREATININE 0.73  TROPONINI <0.03   Estimated Creatinine Clearance: 53.1 mL/min (by C-G formula based on SCr of 0.73 mg/dL).  Medical History: Past Medical History:  Diagnosis Date  . Bowel obstruction ([place])   . Breast cancer ([place])   . Breast cancer, stage 3 ([place]) 07/21/2011  . Bronchitis   . CLL (chronic lymphocytic leukemia) ([place]) 07/21/2011  . Migraines     Medications:   Assessment: 83 yo female presented to the ED for SOB. Pt found to have tachycardia and irregular rhythm consistent with afib. Pharmacy has been consulted for IV heparin dosing.  Goal of Therapy:  Heparin level 0.3-0.7 units/ml Monitor platelets by anticoagulation protocol: Yes   Plan:  Heparin 3300 IV bolus Heparin infusion at 950 units/hr Heparin level in 8 hrs  Preliminary review of pertinent patient information completed.  Forestine Na clinical pharmacist will complete review during morning rounds to assess the patient and finalize treatment regimen.  Norberto Sorenson, [facility] 06/27/2018,12:54 AM

## 2018-06-27 NOTE — Progress Notes (Signed)
Removed IV-clean, dry, intact. Reviewed d/c paperwork patient. Reviewed new medications. Answered all questions. Wheeled stable patient and belongings to main entrance where she was picked up by her sister to d/c to home.

## 2018-07-17 ENCOUNTER — Ambulatory Visit: Payer: Medicare HMO | Admitting: Student

## 2018-07-17 ENCOUNTER — Encounter: Payer: Self-pay | Admitting: Student

## 2018-07-17 VITALS — BP 124/72 | HR 57 | Ht 61.0 in | Wt 173.0 lb

## 2018-07-17 DIAGNOSIS — Z79899 Other long term (current) drug therapy: Secondary | ICD-10-CM | POA: Diagnosis not present

## 2018-07-17 DIAGNOSIS — I480 Paroxysmal atrial fibrillation: Secondary | ICD-10-CM

## 2018-07-17 DIAGNOSIS — J449 Chronic obstructive pulmonary disease, unspecified: Secondary | ICD-10-CM | POA: Diagnosis not present

## 2018-07-17 DIAGNOSIS — C9110 Chronic lymphocytic leukemia of B-cell type not having achieved remission: Secondary | ICD-10-CM

## 2018-07-17 MED ORDER — APIXABAN 5 MG PO TABS: 5.0000 mg | ORAL_TABLET | Freq: Two times a day (BID) | ORAL | 6 refills | Status: DC

## 2018-07-17 MED ORDER — METOPROLOL SUCCINATE ER 25 MG PO TB24: 25.0000 mg | ORAL_TABLET | Freq: Every day | ORAL | 1 refills | Status: DC

## 2018-07-17 MED ORDER — METOPROLOL SUCCINATE ER 25 MG PO TB24: 25.0000 mg | ORAL_TABLET | Freq: Every day | ORAL | 3 refills | Status: DC

## 2018-07-17 NOTE — Patient Instructions (Signed)
Medication Instructions:  Stop aspirin   Start eliquis 5 mg two times daily   Labwork: 1 month cbc  Testing/Procedures: none  Follow-Up: Your physician wants you to follow-up in: 6 months .  You will receive a reminder letter in the mail two months in advance. If you don't receive a letter, please call our office to schedule the follow-up appointment.   Any Other Special Instructions Will Be Listed Below (If Applicable).     If you need a refill on your cardiac medications before your next appointment, please call your pharmacy.

## 2018-07-17 NOTE — Progress Notes (Signed)
Cardiology Office Note    Date:  07/17/2018   ID:  [name], DOB [birth year], MRN [id number]  PCP:  Lemmie Evens, MD  Cardiologist: Kate Sable, MD    Chief Complaint  Patient presents with  . Hospitalization Follow-up    History of Present Illness:    [name] is a 83 y.o. female with past medical history of palpitations (PVC's by prior monitor in 05/2017), CLL, and COPD who presents to the office today for hospital follow-up.  She was last examined by Dr. Bronson Ing in 09/2017 and reported chronic dyspnea on exertion which improved with the use of inhalers and denied any associated chest pain. She denied any worsening palpitations and no changes were made to her medication regimen at that time.  In the interim, she presented to [facility] on 06/26/2018 for evaluation of worsening dyspnea. Reported frequent use of her albuterol inhaler and upon EMS arrival she was found to be in atrial fibrillation with RVR. Was admitted for further evaluation of her COPD exacerbation and atrial fibrillation. Was started on Toprol-XL 25 mg daily at the time of admission along with scheduled IV steroids and nebulizer treatments. BNP was elevated to 669 and initial and cyclic troponin values remained negative. Echocardiogram showed a preserved EF of 55 to 60% with mild aortic stenosis. She converted back to normal sinus rhythm overnight and was discharged on Toprol-XL 25 mg daily and continued on ASA at that time given her brief episode of atrial fibrillation. Close cardiology follow-up was arranged to discuss further management of her arrhythmia.  In talking with the patient and her sister today, she reports overall doing well from a cardiac perspective since her recent hospitalization. She denies any palpitations when she was in atrial fibrillation but reports acute dyspnea a that time. Has known COPD but reports her breathing has been at baseline since discharge. Denies any recent  chest pain, palpitations, orthopnea, PND, or lower extremity edema.   Past Medical History:  Diagnosis Date  . Bowel obstruction ([place])   . Breast cancer ([place])   . Breast cancer, stage 3 ([place]) 07/21/2011  . Bronchitis   . CLL (chronic lymphocytic leukemia) ([place]) 07/21/2011  . Migraines   . PAF (paroxysmal atrial fibrillation) ([place])     Past Surgical History:  Procedure Laterality Date  . ABDOMINAL HYSTERECTOMY    . APPENDECTOMY    . CHOLECYSTECTOMY    . LAPAROTOMY N/A 06/08/2015   Procedure: EXPLORATORY LAPAROTOMY;  Surgeon: Aviva Signs, MD;  Location: [facility];  Service: General;  Laterality: N/A;  . LYSIS OF ADHESION N/A 06/08/2015   Procedure: LYSIS OF ADHESION;  Surgeon: Aviva Signs, MD;  Location: [facility];  Service: General;  Laterality: N/A;  . MASTECTOMY     right  . TOTAL HIP ARTHROPLASTY     right    Current Medications: Outpatient Medications Prior to Visit  Medication Sig Dispense Refill  . albuterol (PROVENTIL HFA;VENTOLIN HFA) 108 (90 BASE) MCG/ACT inhaler Inhale 2 puffs into the lungs every 6 (six) hours as needed for shortness of breath.     Marland Kitchen albuterol (PROVENTIL) (2.5 MG/3ML) 0.083% nebulizer solution Take 3 mLs (2.5 mg total) by nebulization every 6 (six) hours as needed for wheezing or shortness of breath (when not relieved by inhaler.). 75 mL 12  . cholecalciferol (VITAMIN D) 1000 UNITS tablet Take 1,000 Units by mouth daily.    Marland Kitchen gabapentin (NEURONTIN) 100 MG capsule Take 100-200 mg by mouth 2 (two) times daily. Take  one capsule in AM, 2 capsules at bedtime    . omeprazole (PRILOSEC) 20 MG capsule Take 20 mg by mouth at bedtime.     Marland Kitchen aspirin 325 MG tablet Take 325 mg by mouth daily.     . metoprolol succinate (TOPROL-XL) 25 MG 24 hr tablet Take 1 tablet (25 mg total) by mouth daily. 30 tablet 1   No facility-administered medications prior to visit.      Allergies:   Codeine and Meclizine   Social History   Socioeconomic History  . Marital status:  Widowed    Spouse name: Not on file  . Number of children: Not on file  . Years of education: 30  . Highest education level: Not on file  Occupational History  . Not on file  Social Needs  . Financial resource strain: Patient refused  . Food insecurity:    Worry: Patient refused    Inability: Patient refused  . Transportation needs:    Medical: Patient refused    Non-medical: Patient refused  Tobacco Use  . Smoking status: Former Smoker    Last attempt to quit: 10/26/1978    Years since quitting: 39.7  . Smokeless tobacco: Never Used  Substance and Sexual Activity  . Alcohol use: No  . Drug use: No  . Sexual activity: Not Currently  Lifestyle  . Physical activity:    Days per week: Patient refused    Minutes per session: Patient refused  . Stress: Rather much  Relationships  . Social connections:    Talks on phone: Patient refused    Gets together: Patient refused    Attends religious service: Patient refused    Active member of club or organization: Patient refused    Attends meetings of clubs or organizations: Patient refused    Relationship status: Patient refused  Other Topics Concern  . Not on file  Social History Narrative  . Not on file     Family History:  The patient's family history includes Cancer in her brother and another family member.   Review of Systems:   Please see the history of present illness.     General:  No chills, fever, night sweats or weight changes.  Cardiovascular:  No chest pain, edema, orthopnea, palpitations, paroxysmal nocturnal dyspnea. Positive for dyspnea on exertion.  Dermatological: No rash, lesions/masses Respiratory: No cough, dyspnea Urologic: No hematuria, dysuria Abdominal:   No nausea, vomiting, diarrhea, bright red blood per rectum, melena, or hematemesis Neurologic:  No visual changes, wkns, changes in mental status. All other systems reviewed and are otherwise negative except as noted above.   Physical Exam:     VS:  BP 124/72 (BP Location: Left Arm)   Pulse (!) 57   Ht 5\' 1"  (1.549 m)   Wt 173 lb (78.5 kg)   SpO2 96%   BMI 32.69 kg/m    General: Well developed, well nourished Caucasian female appearing in no acute distress. Head: Normocephalic, atraumatic, sclera non-icteric, no xanthomas, nares are without discharge.  Neck: No carotid bruits. JVD not elevated.  Lungs: Respirations regular and unlabored, without wheezes or rales.  Heart: Regular rate and rhythm. No S3 or S4.  No murmur, no rubs, or gallops appreciated. Abdomen: Soft, non-tender, non-distended with normoactive bowel sounds. No hepatomegaly. No rebound/guarding. No obvious abdominal masses. Msk:  Strength and tone appear normal for age. No joint deformities or effusions. Extremities: No clubbing or cyanosis. No lower extremity edema.  Distal pedal pulses are 2+ bilaterally. Neuro: Alert  and oriented X 3. Moves all extremities spontaneously. No focal deficits noted. Psych:  Responds to questions appropriately with a normal affect. Skin: No rashes or lesions noted  Wt Readings from Last 3 Encounters:  07/17/18 173 lb (78.5 kg)  06/27/18 179 lb 0.2 oz (81.2 kg)  03/29/18 178 lb (80.7 kg)     Studies/Labs Reviewed:   EKG:  EKG is not ordered today.   Recent Labs: 06/26/2018: ALT 16; B Natriuretic Peptide 669.0; BUN 16; Creatinine, Ser 0.73; Magnesium 2.1; Potassium 3.1; Sodium 140 06/27/2018: Hemoglobin 11.6; Platelets 215; TSH 1.322   Lipid Panel    Component Value Date/Time   CHOL 173 12/24/2015 1310   TRIG 136 12/24/2015 1310   HDL 30 (L) 12/24/2015 1310   CHOLHDL 5.8 12/24/2015 1310   VLDL 27 12/24/2015 1310   LDLCALC 116 (H) 12/24/2015 1310    Additional studies/ records that were reviewed today include:   Event Monitor: 05/2017  Sinus rhythm with occasional PVCs. No sustained arrhythmias.  Symptoms of fatigue correlated with PVCs.  Echocardiogram: 06/27/2018 IMPRESSIONS    1. The left ventricle has  normal systolic function of 27-78%. The cavity size is normal. There is mildly increased left ventricular wall thickness. Echo evidence of impaired diastolic relaxation.  2. The right ventricle has normal systolic function. The cavity in normal in size. There is no increase in right ventricular wall thickness. Estimated RVSP 29 mmHg.  3. Moderately dilated left atrial size.  4. The mitral valve is normal in structure There is moderate mitral annular calcification present.  5. The pulmonic valve is grossly normal.  6. The aortic root is normal in size and structure. Trileaflet aortic valve with mild to moderate annular calcification and mild aortic stenosis, mean gradient 10.7 mmHg and valve area 1.6 cm2 by planimetry.  Assessment:    1. PAF (paroxysmal atrial fibrillation) ([place])   2. CLL (chronic lymphocytic leukemia) ([place])   3. Chronic obstructive pulmonary disease, unspecified COPD type ([place])   4. Medication management      Plan:   In order of problems listed above:  1. Paroxysmal Atrial Fibrillation - was diagnosed during her recent admission for a COPD exacerbation and converted back to NSR while on IV Cardizem. She overall denied any palpitations at that time but had developed acute dyspnea. Has a history of palpitations with prior event monitor showing PVC's.  - she reports HR has overall been well-controlled when checked at home. Remains on Toprol-XL 25mg  daily.  - This patients CHA2DS2-VASc Score and unadjusted Ischemic Stroke Rate (% per year) is equal to 3.2 % stroke rate/year from a score of 3 (Female, Age (2)). Reviewed risks and benefits of anticoagulation with the patient and her sister today. While her recent episode could have been triggered by her COPD exacerbation, she has experienced similar symptoms in the past which could have been representative of recurrent PAF but not captured at that time. She is in agreement to starting anticoagulation and will begin Eliquis 5mg  BID  (only indication for reduced doing at this time is age). Stop ASA. She does have CLL and will need to follow blood counts closely. Plan for repeat CBC in 1 month.   2. CLL - followed by Hematology. Recent labs showed stable counts with WBC 39.8 and Hgb 12.1. Will plan for a repeat CBC in 1 month following initiation of anticoagulation.   3. COPD - saturations appropriate at 96% on RA during today's visit. Uses Albuterol PRN.    Medication  Adjustments/Labs and Tests Ordered: Current medicines are reviewed at length with the patient today.  Concerns regarding medicines are outlined above.  Medication changes, Labs and Tests ordered today are listed in the Patient Instructions below. Patient Instructions  Medication Instructions:  Stop aspirin   Start eliquis 5 mg two times daily   Labwork: 1 month cbc  Testing/Procedures: none  Follow-Up: Your physician wants you to follow-up in: 6 months .  You will receive a reminder letter in the mail two months in advance. If you don't receive a letter, please call our office to schedule the follow-up appointment.  Any Other Special Instructions Will Be Listed Below (If Applicable).  If you need a refill on your cardiac medications before your next appointment, please call your pharmacy.    Signed, Erma Heritage, PA-C  07/17/2018 8:09 PM    [facility] [address] [place], [place] [address] Phone: [phone] Fax: [phone]

## 2018-08-22 ENCOUNTER — Other Ambulatory Visit: Payer: Self-pay | Admitting: Student

## 2018-09-27 ENCOUNTER — Inpatient Hospital Stay (HOSPITAL_COMMUNITY): Payer: Medicare HMO | Attending: Hematology

## 2018-09-27 ENCOUNTER — Other Ambulatory Visit: Payer: Self-pay

## 2018-09-27 DIAGNOSIS — C50911 Malignant neoplasm of unspecified site of right female breast: Secondary | ICD-10-CM | POA: Insufficient documentation

## 2018-09-27 DIAGNOSIS — C9110 Chronic lymphocytic leukemia of B-cell type not having achieved remission: Secondary | ICD-10-CM | POA: Diagnosis present

## 2018-09-27 LAB — CBC WITH DIFFERENTIAL/PLATELET
Abs Immature Granulocytes: 0.06 K/uL (ref 0.00–0.07)
Basophils Absolute: 0.1 K/uL (ref 0.0–0.1)
Basophils Relative: 0 %
Eosinophils Absolute: 0.2 K/uL (ref 0.0–0.5)
Eosinophils Relative: 0 %
HCT: 39.3 % (ref 36.0–46.0)
Hemoglobin: 12.3 g/dL (ref 12.0–15.0)
Immature Granulocytes: 0 %
Lymphocytes Relative: 86 %
Lymphs Abs: 34.1 K/uL — ABNORMAL HIGH (ref 0.7–4.0)
MCH: 28.7 pg (ref 26.0–34.0)
MCHC: 31.3 g/dL (ref 30.0–36.0)
MCV: 91.8 fL (ref 80.0–100.0)
Monocytes Absolute: 1.4 K/uL — ABNORMAL HIGH (ref 0.1–1.0)
Monocytes Relative: 4 %
Neutro Abs: 4.1 K/uL (ref 1.7–7.7)
Neutrophils Relative %: 10 %
Platelets: 206 K/uL (ref 150–400)
RBC: 4.28 MIL/uL (ref 3.87–5.11)
RDW: 15.8 % — ABNORMAL HIGH (ref 11.5–15.5)
WBC: 39.9 K/uL — ABNORMAL HIGH (ref 4.0–10.5)
nRBC: 0.1 % (ref 0.0–0.2)

## 2018-09-27 LAB — COMPREHENSIVE METABOLIC PANEL
ALT: 19 U/L (ref 0–44)
AST: 26 U/L (ref 15–41)
Albumin: 3.8 g/dL (ref 3.5–5.0)
Alkaline Phosphatase: 46 U/L (ref 38–126)
Anion gap: 11 (ref 5–15)
BUN: 24 mg/dL — ABNORMAL HIGH (ref 8–23)
CO2: 19 mmol/L — ABNORMAL LOW (ref 22–32)
Calcium: 9.2 mg/dL (ref 8.9–10.3)
Chloride: 112 mmol/L — ABNORMAL HIGH (ref 98–111)
Creatinine, Ser: 0.7 mg/dL (ref 0.44–1.00)
GFR calc Af Amer: >60 mL/min (ref >60)
GFR calc non Af Amer: >60 mL/min (ref >60)
Glucose, Bld: 156 mg/dL — ABNORMAL HIGH (ref 70–99)
Potassium: 3.9 mmol/L (ref 3.5–5.1)
Sodium: 142 mmol/L (ref 135–145)
Total Bilirubin: 0.6 mg/dL (ref 0.3–1.2)
Total Protein: 6.2 g/dL — ABNORMAL LOW (ref 6.5–8.1)

## 2018-09-27 LAB — LACTATE DEHYDROGENASE: LDH: 137 U/L (ref 98–192)

## 2018-10-04 ENCOUNTER — Telehealth (HOSPITAL_COMMUNITY): Payer: Self-pay | Admitting: *Deleted

## 2018-10-17 ENCOUNTER — Ambulatory Visit: Payer: Medicare HMO | Admitting: Orthopaedic Surgery

## 2018-10-17 ENCOUNTER — Other Ambulatory Visit: Payer: Self-pay

## 2018-10-24 ENCOUNTER — Encounter: Payer: Self-pay | Admitting: Orthopaedic Surgery

## 2018-10-24 ENCOUNTER — Other Ambulatory Visit: Payer: Self-pay

## 2018-10-24 ENCOUNTER — Ambulatory Visit (INDEPENDENT_AMBULATORY_CARE_PROVIDER_SITE_OTHER): Payer: Medicare HMO | Admitting: Orthopaedic Surgery

## 2018-10-24 VITALS — Ht 61.0 in | Wt 160.0 lb

## 2018-10-24 DIAGNOSIS — M1712 Unilateral primary osteoarthritis, left knee: Secondary | ICD-10-CM | POA: Diagnosis not present

## 2018-10-24 MED ORDER — LIDOCAINE HCL 1 % IJ SOLN
2.0000 mL | INTRAMUSCULAR | Status: AC | PRN
  Administered 2018-10-24: 2 mL

## 2018-10-24 MED ORDER — METHYLPREDNISOLONE ACETATE 40 MG/ML IJ SUSP
80.0000 mg | INTRAMUSCULAR | Status: AC | PRN
  Administered 2018-10-24: 80 mg via INTRA_ARTICULAR

## 2018-10-24 MED ORDER — BUPIVACAINE HCL 0.5 % IJ SOLN
2.0000 mL | INTRAMUSCULAR | Status: AC | PRN
  Administered 2018-10-24: 2 mL via INTRA_ARTICULAR

## 2018-10-24 NOTE — Progress Notes (Signed)
Office Visit Note   Patient: [name]           Date of Birth: September 01, 1935           MRN: [id number] Visit Date: 10/24/2018              Requested by: Lemmie Evens, MD [address] [place] [address] PCP: Lemmie Evens, MD   Assessment & Plan: Visit Diagnoses:  1. Unilateral primary osteoarthritis, left knee     Plan: Symptoms of osteoarthritis left knee without injury or trauma.  Prior films revealed near bone-on-bone in the medial compartment with about 3 degrees of varus. Also  experiencing nausea and diarrhea this morning she notes is "normal".  Checked with Dr. Vickey Sages office who thought it would be reasonable to proceed with a cortisone injection and have her follow-up with them if she continues to have a problem with nausea and/or diarrhea.  We did check her temperature and it was 98 7  Follow-Up Instructions: No follow-ups on file.   Orders:  No orders of the defined types were placed in this encounter.  No orders of the defined types were placed in this encounter.     Procedures: Large Joint Inj: L knee on 10/24/2018 1:23 PM Indications: pain and diagnostic evaluation Details: 25 G 1.5 in needle, anteromedial approach  Arthrogram: No  Medications: 2 mL lidocaine 1 %; 2 mL bupivacaine 0.5 %; 80 mg methylPREDNISolone acetate 40 MG/ML Procedure, treatment alternatives, risks and benefits explained, specific risks discussed. Consent was given by the patient. Patient was prepped and draped in the usual sterile fashion.       Clinical Data: No additional findings.   Subjective: Chief Complaint  Patient presents with  . Left Knee - Follow-up  Patient presents today with left knee pain. She was here last in August of 2019 and received a cortisone injection. She said that the injection was very helpful. She is wanting another cortisone injection today. Her pain is located medially with extension and walking. She is using salonpas cream twice  daily. She walks with the assistance of a cane. Woke up this morning with some nausea and diarrhea.  This has resolved but she notes it has recurred in the past.  She has been afebrile and no respiratory symptoms.  We check with Dr. Vickey Sages office who said to proceed with treatment and if her symptoms persist to check with their office  HPI  Review of Systems   Objective: Vital Signs: Ht 5\' 1"  (1.549 m)   Wt 160 lb (72.6 kg)   BMI 30.23 kg/m   Physical Exam Constitutional:      Appearance: She is well-developed.  Eyes:     Pupils: Pupils are equal, round, and reactive to light.  Pulmonary:     Effort: Pulmonary effort is normal.  Skin:    General: Skin is warm and dry.  Neurological:     Mental Status: She is alert and oriented to person, place, and time.  Psychiatric:        Behavior: Behavior normal.     Ortho Exam left knee with predominant medial joint pain.  Lacks a few degrees to full extension.  I do not think she has an effusion.  No popliteal pain or mass.  No calf pain.  Painless range of motion left hip.  Straight leg raise negative.  Able to flex over 105 degrees without instability.  No patella pain Specialty Comments:  No specialty comments available.  Imaging: No results found.   PMFS History: Patient Active Problem List   Diagnosis Date Noted  . Unilateral primary osteoarthritis, left knee 10/24/2018  . COPD with acute exacerbation ([place]) 06/27/2018  . Atrial fibrillation with RVR ([place]) 06/26/2018  . SBO (small bowel obstruction) ([place]) 06/05/2015  . Hyperglycemia 06/05/2015  . Mass of arm 11/15/2011  . CLL (chronic lymphocytic leukemia) ([place]) 07/21/2011  . Breast cancer, stage 3 ([place]) 07/21/2011  . ARTHRITIS, RIGHT HIP 09/23/2008  . Hyperlipidemia 04/25/2008  . OSA (obstructive sleep apnea) 01/16/2008  . FATIGUE 09/14/2007  . ANXIETY 01/08/2007  . Depression 01/08/2007  . ALLERGIC RHINITIS 01/08/2007  . ARTHRITIS 01/08/2007  . HIP PAIN, RIGHT  01/08/2007  . LOW BACK PAIN 01/08/2007  . OSTEOPENIA 01/08/2007  . MIGRAINES, HX OF 01/08/2007   Past Medical History:  Diagnosis Date  . Bowel obstruction ([place])   . Breast cancer ([place])   . Breast cancer, stage 3 ([place]) 07/21/2011  . Bronchitis   . CLL (chronic lymphocytic leukemia) ([place]) 07/21/2011  . Migraines   . PAF (paroxysmal atrial fibrillation) ([facility])     Family History  Problem Relation Age of Onset  . Cancer Brother   . Cancer Other     Past Surgical History:  Procedure Laterality Date  . ABDOMINAL HYSTERECTOMY    . APPENDECTOMY    . CHOLECYSTECTOMY    . LAPAROTOMY N/A 06/08/2015   Procedure: EXPLORATORY LAPAROTOMY;  Surgeon: Aviva Signs, MD;  Location: [facility];  Service: General;  Laterality: N/A;  . LYSIS OF ADHESION N/A 06/08/2015   Procedure: LYSIS OF ADHESION;  Surgeon: Aviva Signs, MD;  Location: [facility];  Service: General;  Laterality: N/A;  . MASTECTOMY     right  . TOTAL HIP ARTHROPLASTY     right   Social History   Occupational History  . Not on file  Tobacco Use  . Smoking status: Former Smoker    Last attempt to quit: 10/26/1978    Years since quitting: 40.0  . Smokeless tobacco: Never Used  Substance and Sexual Activity  . Alcohol use: No  . Drug use: No  . Sexual activity: Not Currently

## 2018-11-13 ENCOUNTER — Other Ambulatory Visit (HOSPITAL_COMMUNITY): Payer: Self-pay | Admitting: Internal Medicine

## 2018-11-13 DIAGNOSIS — Z1231 Encounter for screening mammogram for malignant neoplasm of breast: Secondary | ICD-10-CM

## 2018-11-19 ENCOUNTER — Other Ambulatory Visit: Payer: Self-pay

## 2018-11-19 ENCOUNTER — Other Ambulatory Visit: Payer: Self-pay | Admitting: Internal Medicine

## 2018-11-19 DIAGNOSIS — Z20822 Contact with and (suspected) exposure to covid-19: Secondary | ICD-10-CM

## 2018-11-19 NOTE — Progress Notes (Unsigned)
[id number] 

## 2018-11-22 LAB — NOVEL CORONAVIRUS, NAA: SARS-CoV-2, NAA: NOT DETECTED

## 2019-01-23 ENCOUNTER — Ambulatory Visit: Payer: Medicare HMO | Admitting: Cardiovascular Disease

## 2019-03-08 ENCOUNTER — Encounter: Payer: Self-pay | Admitting: Cardiovascular Disease

## 2019-03-08 ENCOUNTER — Ambulatory Visit: Payer: Medicare HMO | Admitting: Cardiovascular Disease

## 2019-03-08 ENCOUNTER — Other Ambulatory Visit: Payer: Self-pay

## 2019-03-08 VITALS — BP 101/64 | HR 75 | Temp 97.3°F | Ht 61.0 in | Wt 169.0 lb

## 2019-03-08 DIAGNOSIS — C9110 Chronic lymphocytic leukemia of B-cell type not having achieved remission: Secondary | ICD-10-CM

## 2019-03-08 DIAGNOSIS — J449 Chronic obstructive pulmonary disease, unspecified: Secondary | ICD-10-CM | POA: Diagnosis not present

## 2019-03-08 DIAGNOSIS — I350 Nonrheumatic aortic (valve) stenosis: Secondary | ICD-10-CM

## 2019-03-08 DIAGNOSIS — I480 Paroxysmal atrial fibrillation: Secondary | ICD-10-CM

## 2019-03-08 MED ORDER — APIXABAN 5 MG PO TABS: 5.0000 mg | ORAL_TABLET | Freq: Two times a day (BID) | ORAL | 3 refills | Status: DC

## 2019-03-08 MED ORDER — METOPROLOL TARTRATE 25 MG PO TABS: 12.5000 mg | ORAL_TABLET | Freq: Two times a day (BID) | ORAL | 3 refills | Status: DC

## 2019-03-08 NOTE — Patient Instructions (Signed)
Medication Instructions:  Stop ASPIRIN   START ELIQUIS 5 MG TWO TIMES DAILY  START LOPRESSOR 12.5 MG TWO TIMES DAILY   Labwork: NONE  Testing/Procedures: NONE  Follow-Up: Your physician wants you to follow-up in: 1 YEAR.  You will receive a reminder letter in the mail two months in advance. If you don't receive a letter, please call our office to schedule the follow-up appointment.   Any Other Special Instructions Will Be Listed Below (If Applicable).     If you need a refill on your cardiac medications before your next appointment, please call your pharmacy.

## 2019-03-08 NOTE — Progress Notes (Signed)
SUBJECTIVE: The patient presents for follow-up of paroxysmal atrial fibrillation.  She also has CLL and COPD.  Atrial fibrillation was diagnosed during hospitalization for COPD exacerbation earlier this year.  She denies chest pain.  Chronic exertional dyspnea is stable.  She seldom has palpitations.  She denies leg swelling.  She is no longer on Toprol-XL or Eliquis because she said she had no refills.  She started taking aspirin 325 mg daily on her own.    Review of Systems: As per "subjective", otherwise negative.  Allergies  Allergen Reactions  . Codeine Nausea And Vomiting    headache  . Meclizine Other (See Comments)    Made dizziness worse.    Current Outpatient Medications  Medication Sig Dispense Refill  . albuterol (PROVENTIL HFA;VENTOLIN HFA) 108 (90 BASE) MCG/ACT inhaler Inhale 2 puffs into the lungs every 6 (six) hours as needed for shortness of breath.     Marland Kitchen albuterol (PROVENTIL) (2.5 MG/3ML) 0.083% nebulizer solution Take 3 mLs (2.5 mg total) by nebulization every 6 (six) hours as needed for wheezing or shortness of breath (when not relieved by inhaler.). 75 mL 12  . aspirin EC 325 MG tablet Take 325 mg by mouth daily.    . cholecalciferol (VITAMIN D) 1000 UNITS tablet Take 1,000 Units by mouth daily.    Marland Kitchen gabapentin (NEURONTIN) 100 MG capsule Take 100-200 mg by mouth 2 (two) times daily. Take one capsule in AM, 2 capsules at bedtime    . omeprazole (PRILOSEC) 20 MG capsule Take 20 mg by mouth at bedtime.      No current facility-administered medications for this visit.     Past Medical History:  Diagnosis Date  . Bowel obstruction ([place])   . Breast cancer ([place])   . Breast cancer, stage 3 ([place]) 07/21/2011  . Bronchitis   . CLL (chronic lymphocytic leukemia) ([place]) 07/21/2011  . Migraines   . PAF (paroxysmal atrial fibrillation) ([place])     Past Surgical History:  Procedure Laterality Date  . ABDOMINAL HYSTERECTOMY    . APPENDECTOMY    .  CHOLECYSTECTOMY    . LAPAROTOMY N/A 06/08/2015   Procedure: EXPLORATORY LAPAROTOMY;  Surgeon: Aviva Signs, MD;  Location: [facility];  Service: General;  Laterality: N/A;  . LYSIS OF ADHESION N/A 06/08/2015   Procedure: LYSIS OF ADHESION;  Surgeon: Aviva Signs, MD;  Location: [facility];  Service: General;  Laterality: N/A;  . MASTECTOMY     right  . TOTAL HIP ARTHROPLASTY     right    Social History   Socioeconomic History  . Marital status: Widowed    Spouse name: Not on file  . Number of children: Not on file  . Years of education: 60  . Highest education level: Not on file  Occupational History  . Not on file  Social Needs  . Financial resource strain: Patient refused  . Food insecurity    Worry: Patient refused    Inability: Patient refused  . Transportation needs    Medical: Patient refused    Non-medical: Patient refused  Tobacco Use  . Smoking status: Former Smoker    Quit date: 10/26/1978    Years since quitting: 40.3  . Smokeless tobacco: Never Used  Substance and Sexual Activity  . Alcohol use: No  . Drug use: No  . Sexual activity: Not Currently  Lifestyle  . Physical activity    Days per week: Patient refused    Minutes per session: Patient refused  .  Stress: Rather much  Relationships  . Social Herbalist on phone: Patient refused    Gets together: Patient refused    Attends religious service: Patient refused    Active member of club or organization: Patient refused    Attends meetings of clubs or organizations: Patient refused    Relationship status: Patient refused  . Intimate partner violence    Fear of current or ex partner: Patient refused    Emotionally abused: Patient refused    Physically abused: Patient refused    Forced sexual activity: Patient refused  Other Topics Concern  . Not on file  Social History Narrative  . Not on file     Vitals:   03/08/19 1112  BP: 101/64  Pulse: 75  Temp: (!) 97.3 F (36.3 C)  TempSrc: Temporal   SpO2: 98%  Weight: 169 lb (76.7 kg)  Height: 5\' 1"  (1.549 m)    Wt Readings from Last 3 Encounters:  03/08/19 169 lb (76.7 kg)  10/24/18 160 lb (72.6 kg)  07/17/18 173 lb (78.5 kg)     PHYSICAL EXAM General: NAD HEENT: Normal. Neck: No JVD, no thyromegaly. Lungs: Clear to auscultation bilaterally with normal respiratory effort. CV: Regular rate and rhythm, normal S1/S2, no S3/S4, no murmur. No pretibial or periankle edema.  No carotid bruit.   Abdomen: Soft, nontender, no distention.  Neurologic: Alert and oriented.  Psych: Normal affect. Skin: Normal. Musculoskeletal: No gross deformities.      Labs: Lab Results  Component Value Date/Time   K 3.9 09/27/2018 12:23 PM   K 4.0 10/12/2012 10:38 AM   BUN 24 (H) 09/27/2018 12:23 PM   BUN 16.1 10/12/2012 10:38 AM   CREATININE 0.70 09/27/2018 12:23 PM   CREATININE 0.9 10/12/2012 10:38 AM   ALT 19 09/27/2018 12:23 PM   ALT 20 10/12/2012 10:38 AM   TSH 1.322 06/27/2018 05:17 AM   TSH 2.865 09/14/2007 10:39 PM   HGB 12.3 09/27/2018 12:23 PM   HGB 13.5 10/12/2012 10:38 AM     Lipids: Lab Results  Component Value Date/Time   LDLCALC 116 (H) 12/24/2015 01:10 PM   CHOL 173 12/24/2015 01:10 PM   TRIG 136 12/24/2015 01:10 PM   HDL 30 (L) 12/24/2015 01:10 PM     Event Monitor: 05/2017  Sinus rhythm with occasional PVCs. No sustained arrhythmias.  Symptoms of fatigue correlated with PVCs.  Echocardiogram: 06/27/2018 IMPRESSIONS  1. The left ventricle has normal systolic function of 0000000. The cavity size is normal. There is mildly increased left ventricular wall thickness. Echo evidence of impaired diastolic relaxation. 2. The right ventricle has normal systolic function. The cavity in normal in size. There is no increase in right ventricular wall thickness. Estimated RVSP 29 mmHg. 3. Moderately dilated left atrial size. 4. The mitral valve is normal in structure There is moderate mitral annular calcification  present. 5. The pulmonic valve is grossly normal. 6. The aortic root is normal in size and structure. Trileaflet aortic valve with mild to moderate annular calcification and mild aortic stenosis, mean gradient 10.7 mmHg and valve area 1.6 cm2 by planimetry.   ASSESSMENT AND PLAN: 1.  Paroxysmal atrial fibrillation: Symptomatically stable.  She had been on Toprol-XL 25 mg daily.  She was started on Eliquis 5 mg twice daily on 07/17/2018.  She is now only on aspirin 325 mg daily.  Moderate left atrial dilatation by echocardiogram on 06/27/2018.  Blood pressure is soft so I will start Lopressor 12.5 mg  twice daily.  I will stop aspirin and resume Eliquis 5 mg twice daily.  The reason she was no longer on Toprol-XL or Eliquis is because she had no refills of either medication.  2.  CLL: Followed by hematology.  3.  COPD: She uses albuterol as needed.  4.  Aortic stenosis: Mild in severity by echocardiogram on 06/27/2018.  I will monitor.    Disposition: Follow up 1 yr.   Kate Sable, M.D., F.A.C.C.

## 2019-03-12 ENCOUNTER — Other Ambulatory Visit: Payer: Self-pay | Admitting: *Deleted

## 2019-03-12 DIAGNOSIS — Z20822 Contact with and (suspected) exposure to covid-19: Secondary | ICD-10-CM

## 2019-03-13 LAB — NOVEL CORONAVIRUS, NAA: SARS-CoV-2, NAA: NOT DETECTED

## 2019-03-20 ENCOUNTER — Telehealth: Payer: Self-pay | Admitting: Cardiovascular Disease

## 2019-03-20 NOTE — Telephone Encounter (Signed)
Please give pt a call concerning a Chest Xray that showed she had an enlarged heart  [phone]

## 2019-03-20 NOTE — Telephone Encounter (Signed)
Returned pt call. She stated that she went to urgent care & stated that she got a message from them stating she had an enlarged heart. I advised her to go there and sign a release to have them release the records and have them faxed here. She voiced understanding of plan. Gave her fax #.

## 2019-03-21 ENCOUNTER — Other Ambulatory Visit (HOSPITAL_COMMUNITY): Payer: Self-pay | Admitting: Pulmonary Disease

## 2019-03-21 DIAGNOSIS — J90 Pleural effusion, not elsewhere classified: Secondary | ICD-10-CM

## 2019-03-22 DIAGNOSIS — I4891 Unspecified atrial fibrillation: Secondary | ICD-10-CM

## 2019-03-22 DIAGNOSIS — E785 Hyperlipidemia, unspecified: Secondary | ICD-10-CM

## 2019-03-25 ENCOUNTER — Other Ambulatory Visit: Payer: Self-pay

## 2019-03-25 ENCOUNTER — Encounter (HOSPITAL_COMMUNITY): Payer: Self-pay

## 2019-03-25 ENCOUNTER — Inpatient Hospital Stay (HOSPITAL_COMMUNITY)
Admission: EM | Admit: 2019-03-25 | Discharge: 2019-03-27 | DRG: 292 | Disposition: A | Discharge status: Home / Self Care | Payer: Medicare HMO | Attending: Internal Medicine | Admitting: Internal Medicine

## 2019-03-25 ENCOUNTER — Emergency Department (HOSPITAL_COMMUNITY): Payer: Medicare HMO

## 2019-03-25 DIAGNOSIS — I480 Paroxysmal atrial fibrillation: Secondary | ICD-10-CM

## 2019-03-25 DIAGNOSIS — I509 Heart failure, unspecified: Secondary | ICD-10-CM | POA: Diagnosis present

## 2019-03-25 DIAGNOSIS — Z7901 Long term (current) use of anticoagulants: Secondary | ICD-10-CM

## 2019-03-25 DIAGNOSIS — Z96641 Presence of right artificial hip joint: Secondary | ICD-10-CM | POA: Diagnosis present

## 2019-03-25 DIAGNOSIS — I5031 Acute diastolic (congestive) heart failure: Secondary | ICD-10-CM | POA: Diagnosis not present

## 2019-03-25 DIAGNOSIS — Z9011 Acquired absence of right breast and nipple: Secondary | ICD-10-CM

## 2019-03-25 DIAGNOSIS — I5041 Acute combined systolic (congestive) and diastolic (congestive) heart failure: Secondary | ICD-10-CM | POA: Diagnosis present

## 2019-03-25 DIAGNOSIS — Z853 Personal history of malignant neoplasm of breast: Secondary | ICD-10-CM

## 2019-03-25 DIAGNOSIS — R918 Other nonspecific abnormal finding of lung field: Secondary | ICD-10-CM | POA: Diagnosis present

## 2019-03-25 DIAGNOSIS — Z20828 Contact with and (suspected) exposure to other viral communicable diseases: Secondary | ICD-10-CM | POA: Diagnosis present

## 2019-03-25 DIAGNOSIS — E785 Hyperlipidemia, unspecified: Secondary | ICD-10-CM | POA: Diagnosis present

## 2019-03-25 DIAGNOSIS — J4489 Other specified chronic obstructive pulmonary disease: Secondary | ICD-10-CM

## 2019-03-25 DIAGNOSIS — I361 Nonrheumatic tricuspid (valve) insufficiency: Secondary | ICD-10-CM | POA: Diagnosis not present

## 2019-03-25 DIAGNOSIS — I447 Left bundle-branch block, unspecified: Secondary | ICD-10-CM | POA: Diagnosis present

## 2019-03-25 DIAGNOSIS — C9110 Chronic lymphocytic leukemia of B-cell type not having achieved remission: Secondary | ICD-10-CM | POA: Diagnosis present

## 2019-03-25 DIAGNOSIS — E782 Mixed hyperlipidemia: Secondary | ICD-10-CM | POA: Diagnosis present

## 2019-03-25 DIAGNOSIS — J209 Acute bronchitis, unspecified: Secondary | ICD-10-CM | POA: Diagnosis present

## 2019-03-25 DIAGNOSIS — I5043 Acute on chronic combined systolic (congestive) and diastolic (congestive) heart failure: Secondary | ICD-10-CM

## 2019-03-25 DIAGNOSIS — J440 Chronic obstructive pulmonary disease with acute lower respiratory infection: Secondary | ICD-10-CM | POA: Diagnosis present

## 2019-03-25 DIAGNOSIS — R9389 Abnormal findings on diagnostic imaging of other specified body structures: Secondary | ICD-10-CM

## 2019-03-25 DIAGNOSIS — J449 Chronic obstructive pulmonary disease, unspecified: Secondary | ICD-10-CM

## 2019-03-25 DIAGNOSIS — R001 Bradycardia, unspecified: Secondary | ICD-10-CM | POA: Diagnosis not present

## 2019-03-25 DIAGNOSIS — I340 Nonrheumatic mitral (valve) insufficiency: Secondary | ICD-10-CM | POA: Diagnosis not present

## 2019-03-25 DIAGNOSIS — I5021 Acute systolic (congestive) heart failure: Secondary | ICD-10-CM | POA: Diagnosis not present

## 2019-03-25 HISTORY — DX: Pneumonia, unspecified organism: J18.9

## 2019-03-25 LAB — CBC WITH DIFFERENTIAL/PLATELET
Abs Immature Granulocytes: 0.06 10*3/uL (ref 0.00–0.07)
Basophils Absolute: 0.2 10*3/uL — ABNORMAL HIGH (ref 0.0–0.1)
Basophils Relative: 1 %
Eosinophils Absolute: 0.2 10*3/uL (ref 0.0–0.5)
Eosinophils Relative: 1 %
HCT: 36.4 % (ref 36.0–46.0)
Hemoglobin: 11.3 g/dL — ABNORMAL LOW (ref 12.0–15.0)
Immature Granulocytes: 0 %
Lymphocytes Relative: 78 %
Lymphs Abs: 26.7 10*3/uL — ABNORMAL HIGH (ref 0.7–4.0)
MCH: 30 pg (ref 26.0–34.0)
MCHC: 31 g/dL (ref 30.0–36.0)
MCV: 96.6 fL (ref 80.0–100.0)
Monocytes Absolute: 1.4 10*3/uL — ABNORMAL HIGH (ref 0.1–1.0)
Monocytes Relative: 4 %
Neutro Abs: 5.5 10*3/uL (ref 1.7–7.7)
Neutrophils Relative %: 16 %
Platelets: 213 10*3/uL (ref 150–400)
RBC: 3.77 MIL/uL — ABNORMAL LOW (ref 3.87–5.11)
RDW: 14.6 % (ref 11.5–15.5)
WBC: 34 10*3/uL — ABNORMAL HIGH (ref 4.0–10.5)
nRBC: 0 % (ref 0.0–0.2)

## 2019-03-25 LAB — COMPREHENSIVE METABOLIC PANEL
ALT: 28 U/L (ref 0–44)
AST: 24 U/L (ref 15–41)
Albumin: 3.9 g/dL (ref 3.5–5.0)
Alkaline Phosphatase: 39 U/L (ref 38–126)
Anion gap: 5 (ref 5–15)
BUN: 16 mg/dL (ref 8–23)
CO2: 23 mmol/L (ref 22–32)
Calcium: 8.5 mg/dL — ABNORMAL LOW (ref 8.9–10.3)
Chloride: 113 mmol/L — ABNORMAL HIGH (ref 98–111)
Creatinine, Ser: 0.74 mg/dL (ref 0.44–1.00)
GFR calc Af Amer: >60 mL/min (ref >60)
GFR calc non Af Amer: >60 mL/min (ref >60)
Glucose, Bld: 108 mg/dL — ABNORMAL HIGH (ref 70–99)
Potassium: 3.7 mmol/L (ref 3.5–5.1)
Sodium: 141 mmol/L (ref 135–145)
Total Bilirubin: 1 mg/dL (ref 0.3–1.2)
Total Protein: 6.2 g/dL — ABNORMAL LOW (ref 6.5–8.1)

## 2019-03-25 LAB — TROPONIN I (HIGH SENSITIVITY)
Troponin I (High Sensitivity): 7 ng/L (ref <18)
Troponin I (High Sensitivity): 8 ng/L (ref <18)

## 2019-03-25 LAB — BRAIN NATRIURETIC PEPTIDE: B Natriuretic Peptide: 1067 pg/mL — ABNORMAL HIGH (ref 0.0–100.0)

## 2019-03-25 LAB — TSH: TSH: 2.477 u[IU]/mL (ref 0.350–4.500)

## 2019-03-25 MED ORDER — METHYLPREDNISOLONE SODIUM SUCC 40 MG IJ SOLR
40.0000 mg | Freq: Four times a day (QID) | INTRAMUSCULAR | Status: DC
  Administered 2019-03-26 – 2019-03-27 (×7): 40 mg via INTRAVENOUS
  Filled 2019-03-25 (×7): qty 1

## 2019-03-25 MED ORDER — ACETAMINOPHEN 650 MG RE SUPP: 650.0000 mg | Freq: Four times a day (QID) | RECTAL | Status: DC | PRN

## 2019-03-25 MED ORDER — FUROSEMIDE 10 MG/ML IJ SOLN
40.0000 mg | INTRAMUSCULAR | Status: AC
  Administered 2019-03-25: 19:00:00 40 mg via INTRAVENOUS
  Filled 2019-03-25: qty 4

## 2019-03-25 MED ORDER — METOPROLOL TARTRATE 25 MG PO TABS
12.5000 mg | ORAL_TABLET | Freq: Two times a day (BID) | ORAL | Status: DC
  Administered 2019-03-25: 12.5 mg via ORAL
  Filled 2019-03-25 (×2): qty 1

## 2019-03-25 MED ORDER — APIXABAN 5 MG PO TABS
5.0000 mg | ORAL_TABLET | Freq: Two times a day (BID) | ORAL | Status: DC
  Administered 2019-03-25 – 2019-03-27 (×4): 5 mg via ORAL
  Filled 2019-03-25 (×5): qty 1

## 2019-03-25 MED ORDER — ACETAMINOPHEN 325 MG PO TABS
650.0000 mg | ORAL_TABLET | Freq: Four times a day (QID) | ORAL | Status: DC | PRN
  Administered 2019-03-26 – 2019-03-27 (×4): 650 mg via ORAL
  Filled 2019-03-25 (×4): qty 2

## 2019-03-25 MED ORDER — ASPIRIN EC 81 MG PO TBEC
81.0000 mg | DELAYED_RELEASE_TABLET | Freq: Every day | ORAL | Status: DC
  Administered 2019-03-26: 09:00:00 81 mg via ORAL
  Filled 2019-03-25: qty 1

## 2019-03-25 MED ORDER — IPRATROPIUM-ALBUTEROL 0.5-2.5 (3) MG/3ML IN SOLN
3.0000 mL | Freq: Four times a day (QID) | RESPIRATORY_TRACT | Status: DC
  Administered 2019-03-26 – 2019-03-27 (×5): 3 mL via RESPIRATORY_TRACT
  Filled 2019-03-25 (×5): qty 3

## 2019-03-25 NOTE — H&P (Signed)
History and Physical:    [name]   [id number] DOB: [birth year] DOA: 03/25/2019  Referring MD/provider: Dr. Sabra Heck PCP: Lemmie Evens, MD   Patient coming from: Home  Chief Complaint: Dyspnea on exertion for 3 weeks  History of Present Illness:   [name] is an 83 y.o. 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  Patient states that she used to be more active, walked her dog daily but about 3 weeks ago noted that she was increasingly short of breath while walking her dog and even around her small apartment which is unusual for her.  At that time patient had no fevers chills or malaise.  She did have a cough which was newly productive of a clear phlegm. 2 weeks ago she went to see an urgent care physician who diagnosed her with pneumonia and gave her a 10-day course of cefdinir.  She was also told that she had an enlarged heart and was scheduled for an outpatient echocardiogram.  She was ruled out for Covid.  Patient notes that her cough improved however her exercise tolerance did not.  Today patient states that she was quite short of breath just walking to her kitchen which was quite unusual for her.  Patient continues to otherwise feel well.  She has no malaise.  She has no abdominal pain nausea or vomiting.  No loss of smell or taste.  Patient does admit to diarrhea while she was taking her antibiotics however this stopped when she stopped taking her antibiotics.  She does also admit to new onset orthopnea.  She used to sleep on 1 pillow at night now needs 2 pillows to be comfortable.  Patient denies PND. Patient specifically denies any chest pain, back pain, new onset shoulder or neck or jaw pain prior to her new onset  dyspnea.  ED Course:  The patient underwent a bedside ED echocardiogram which seem to show decreased ejection fraction as well as some paradoxical septal motion suggestive of right heart strain.  Patient was given Lasix 40 mg IV and has urinated multiple times per RN and nursing assistant however they are not measuring her urine output.  Patient herself is not sure if she feels better.  ROS:   ROS   Review of Systems: As per HPI.  Past Medical History:   Past Medical History:  Diagnosis Date   Bowel obstruction ([place])    Breast cancer ([place])    Breast cancer, stage 3 ([place]) 07/21/2011   Bronchitis    CLL (chronic lymphocytic leukemia) ([place]) 07/21/2011   Migraines    PAF (paroxysmal atrial fibrillation) (HCC)    Pneumonia     Past Surgical History:   Past Surgical History:  Procedure Laterality Date   ABDOMINAL HYSTERECTOMY     APPENDECTOMY     CHOLECYSTECTOMY     LAPAROTOMY N/A 06/08/2015   Procedure: EXPLORATORY LAPAROTOMY;  Surgeon: Aviva Signs, MD;  Location: [facility];  Service: General;  Laterality: N/A;   LYSIS OF ADHESION N/A 06/08/2015   Procedure: LYSIS OF ADHESION;  Surgeon: Aviva Signs, MD;  Location: [facility];  Service: General;  Laterality: N/A;   MASTECTOMY     right   TOTAL HIP ARTHROPLASTY     right  Social History:   Social History   Socioeconomic History   Marital status: Widowed    Spouse name: Not on file   Number of children: Not on file   Years of education: 11   Highest education level: Not on file  Occupational History   Not on file  Social Needs   Financial resource strain: Patient refused   Food insecurity    Worry: Patient refused    Inability: Patient refused   Transportation needs    Medical: Patient refused    Non-medical: Patient refused  Tobacco Use   Smoking status: Former Smoker    Quit date: 10/26/1978    Years since quitting: 40.4   Smokeless tobacco: Never Used  Substance and Sexual Activity    Alcohol use: No   Drug use: No   Sexual activity: Not Currently  Lifestyle   Physical activity    Days per week: Patient refused    Minutes per session: Patient refused   Stress: Rather much  Relationships   Social connections    Talks on phone: Patient refused    Gets together: Patient refused    Attends religious service: Patient refused    Active member of club or organization: Patient refused    Attends meetings of clubs or organizations: Patient refused    Relationship status: Patient refused   Intimate partner violence    Fear of current or ex partner: Patient refused    Emotionally abused: Patient refused    Physically abused: Patient refused    Forced sexual activity: Patient refused  Other Topics Concern   Not on file  Social History Narrative   Not on file    Allergies   Codeine and Meclizine  Family history:   Family History  Problem Relation Age of Onset   Cancer Brother    Cancer Other     Current Medications:   Prior to Admission medications   Medication Sig Start Date End Date Taking? Authorizing Provider  albuterol (PROVENTIL HFA;VENTOLIN HFA) 108 (90 BASE) MCG/ACT inhaler Inhale 2 puffs into the lungs every 6 (six) hours as needed for shortness of breath.     [provider]  albuterol (PROVENTIL) (2.5 MG/3ML) 0.083% nebulizer solution Take 3 mLs (2.5 mg total) by nebulization every 6 (six) hours as needed for wheezing or shortness of breath (when not relieved by inhaler.). 06/27/18   Barton Dubois, MD  apixaban (ELIQUIS) 5 MG TABS tablet Take 1 tablet (5 mg total) by mouth 2 (two) times daily. 03/08/19   Herminio Commons, MD  cefdinir (OMNICEF) 300 MG capsule Take 300 mg by mouth 2 (two) times daily. 10 day course starting on 03/12/2019 03/12/19   [provider]  cholecalciferol (VITAMIN D) 1000 UNITS tablet Take 1,000 Units by mouth daily.    [provider]  diphenoxylate-atropine (LOMOTIL) 2.5-0.025 MG  tablet Take 1-2 tablets by mouth every 8 (eight) hours as needed for diarrhea or loose stools.  03/18/19   [provider]  gabapentin (NEURONTIN) 100 MG capsule Take 100-200 mg by mouth 2 (two) times daily. Take one capsule in AM, 2 capsules at bedtime 12/17/14   [provider]  metoprolol tartrate (LOPRESSOR) 25 MG tablet Take 0.5 tablets (12.5 mg total) by mouth 2 (two) times daily. 03/08/19 06/06/19  Herminio Commons, MD  omeprazole (PRILOSEC) 20 MG capsule Take 20 mg by mouth at bedtime.     [provider]    Physical Exam:   Vitals:  03/25/19 1630 03/25/19 1700 03/25/19 1730 03/25/19 1900  BP: (!) 109/91 121/78 97/86 104/80  Pulse:  60 60 69  Resp:  17 (!) 21 15  Temp:      TempSrc:      SpO2:  94% 96% 94%  Weight:      Height:         Physical Exam: Blood pressure 104/80, pulse 69, temperature 98.4 F (36.9 C), temperature source Oral, resp. rate 15, height 5\' 1"  (1.549 m), weight 76 kg, SpO2 94 %. Gen: Spry well-appearing female sitting up in no acute distress.  She is able to speak in full sentences with no difficulty.  She does not appear to be dyspneic at all. Eyes: Sclerae anicteric. Conjunctiva mildly injected. Chest: Decreased air entry bilaterally with scattered rales.  No wheezes.   CV: Distant, irregular, soft systolic murmur.  Abdomen: NABS, soft, nondistended, nontender. No tenderness to light or deep palpation. No rebound, no guarding. Extremities: No edema.  Skin: Warm and dry. No rashes, lesions or wounds. Neuro: Alert and oriented times 3; grossly nonfocal. Psych: Patient is cooperative, logical and coherent with appropriate mood and affect.  Data Review:    Labs: Basic Metabolic Panel: Recent Labs  Lab 03/25/19 1628  NA 141  K 3.7  CL 113*  CO2 23  GLUCOSE 108*  BUN 16  CREATININE 0.74  CALCIUM 8.5*   Liver Function Tests: Recent Labs  Lab 03/25/19 1628  AST 24  ALT 28  ALKPHOS 39  BILITOT 1.0  PROT 6.2*   ALBUMIN 3.9   No results for input(s): LIPASE, AMYLASE in the last 168 hours. No results for input(s): AMMONIA in the last 168 hours. CBC: Recent Labs  Lab 03/25/19 1628  WBC 34.0*  NEUTROABS 5.5  HGB 11.3*  HCT 36.4  MCV 96.6  PLT 213   Cardiac Enzymes: No results for input(s): CKTOTAL, CKMB, CKMBINDEX, TROPONINI in the last 168 hours.  BNP (last 3 results) No results for input(s): PROBNP in the last 8760 hours. CBG: No results for input(s): GLUCAP in the last 168 hours.  Urinalysis    Component Value Date/Time   COLORURINE YELLOW 06/05/2015 1303   APPEARANCEUR CLEAR 06/05/2015 1303   LABSPEC >1.030 (H) 06/05/2015 1303   PHURINE 5.5 06/05/2015 1303   GLUCOSEU NEGATIVE 06/05/2015 1303   HGBUR MODERATE (A) 06/05/2015 1303   BILIRUBINUR NEGATIVE 06/05/2015 1303   KETONESUR TRACE (A) 06/05/2015 1303   PROTEINUR TRACE (A) 06/05/2015 1303   UROBILINOGEN 0.2 10/17/2008 1128   NITRITE NEGATIVE 06/05/2015 1303   LEUKOCYTESUR NEGATIVE 06/05/2015 1303      Radiographic Studies: Dg Chest Port 1 View  Result Date: 03/25/2019 CLINICAL DATA:  Shortness of breath, worsening for 2 weeks EXAM: PORTABLE CHEST 1 VIEW COMPARISON:  Radiograph 03/13/2019 FINDINGS: Stable cardiomegaly and a calcified, tortuous aorta. Increasing interstitial opacities with septal lines and cephalized, indistinct pulmonary vascularity. Mild central vascular cuffing. There is patchy retrocardiac opacity in the left lung base. Small bilateral pleural effusions are noted. Biapical pleuroparenchymal scarring is seen. Degenerative changes are present in the imaged spine and shoulders. IMPRESSION: 1. Stable cardiomegaly with increasing interstitial pulmonary edema. 2. Small bilateral pleural effusions. 3. More focal opacity in the left lung base could reflect atelectasis or consolidation. 4.  Aortic Atherosclerosis (ICD10-I70.0). Electronically Signed   By: Lovena Le M.D.   On: 03/25/2019 17:36     EKG: Independently reviewed.  Sinus rhythm at 60 with first-degree heart block.  LBBB. Limb leads with  poor voltage.  Axis is 40 degrees per computer, I am unable to assess it independently myself.  Assessment/Plan:   Principal Problem:   New onset of congestive heart failure (HCC) Active Problems:   Hyperlipidemia   CLL (chronic lymphocytic leukemia) (HCC)   PAF (paroxysmal atrial fibrillation) (HCC)   COPD with chronic bronchitis ([place])  83 year old female with COPD now presents with 3 weeks of subacute onset of dyspnea on exertion.  Patient has been treated with cephalosporin without improvement in symptoms.  Work-up here reveals an abnormal chest x-ray with both interstitial opacities and cephalization as well as patchy retrocardiac opacity in left lung base.  She also appears to have decreased ejection fraction of left ventricle as well as some right ventricular strain on bedside ultrasound done in ED.  DYSPNEA Likely multifactorial.  She has poor baseline functioning secondary to COPD and now appears to have superimposed pulmonary edema likely secondary to new cardiac dysfunction.  Also of note she has an retrocardiac opacity of the left lung of unclear etiology.  Pulmonary emboli as a source of DOE is less likely given Wells score of 1 (CLL) and that she is on Eliquis although it is clear that she ran out of her Eliquis per cardiology note from 03/08/19.  PULMONARY EDEMA/NEW ONSET HEART DYSFUNCTION Patient has new onset orthopnea, new pleural effusions with increased interstitial markings and an elevated BNP all of which are suggestive of acute decompensated heart failure. Patient received Lasix 40 mg IV in the ED however we do not know what her I's and O's are as they are not collecting her urine.  Patient herself is not sure if she feels improved with the Lasix. I have not ordered any further Lasix at present, can reassess patient clinically in the morning and see if she needs  further diuresis. Echocardiogram ordered. Cardiology consultation requested, if she does have new onset systolic dysfunction she will likely need a cath or at least a stress test.  ABNORMAL CHEST XRAY Unclear etiology of retrocardiac left lower lobe infiltrate. Since she has recently finished a course of antibiotics, I am not starting any further antibiotics. Noncontrast chest CT is ordered for the morning.  COPD/CHRONIC BRONCHITIS Patient may well have some level of COPD exacerbation. Will treat with Solu-Medrol 40 every 6 to see if this helps her at all. DuoNebs every 6 hours standing As needed albuterol every 2 hours  ATRIAL FIBRILLATION Continue metoprolol and Eliquis  CLL WBC is stable at 35,000 I am not concerned about hemoocclusive crisis or anything like that.   Other information:   DVT prophylaxis: On Eliquis Code Status: Full code. Family Communication: Patient states there is no need to call her family Disposition Plan: Home Consults called: Cardiology Admission status: Inpatient  The medical decision making on this patient was of high complexity and the patient is at high risk for clinical deterioration, therefore this is a level 3 visit.   Dewaine Oats Tublu Teddi Badalamenti [facility]  If 7PM-7AM, please contact night-coverage [email or web address] Password [facility] 03/25/2019, 8:03 PM

## 2019-03-25 NOTE — ED Notes (Signed)
Attempted to call report to 3rd floor. Unable to reach [facility] by phone after transferred by Network engineer.

## 2019-03-25 NOTE — ED Triage Notes (Addendum)
EMS reports pt has been getting SOB with exertion for past couple of days.  Reports also unable to tolerate laying flat in bed.  Reports sleeping on a couple of pillows.  Reports has had 2 negative covid tests recently.  Recently treated with po antibiotics for pneumonia.

## 2019-03-25 NOTE — ED Provider Notes (Signed)
[facility] EMERGENCY DEPARTMENT Provider Note   CSN: [id number] Arrival date & time: 03/25/19  1618     History   Chief Complaint Chief Complaint  Patient presents with  . Shortness of Breath    HPI [name] is a 83 y.o. female.     HPI  83 year old female, she has a known history of breast cancer on the right side status post resection many years ago, she also has a history of chronic lymphocytic leukemia and paroxysmal atrial fibrillation.  She denies any history of congestive heart failure, coronary obstructive disease etc.  She has been told recently by an urgent care that she had pneumonia, she had presented with some respiratory symptoms, had an x-ray and was told that she had both a pneumonia and an enlarged heart on the x-ray, was treated with cefdinir as an outpatient and states that she initially improved however since that time is had gradual onset of recurrent shortness of breath which is persistent, gradually worsening and is now so severe that she can barely walk 5 steps across her room before she becomes severely dyspneic.  Even talking makes her feel short of breath.  She denies fevers, denies overt worsening of the swelling of her legs and is getting even short of breath at night when she lays down.  Recently she had to buy a second pillow so that she was sleeping in a more upright position.  No fevers, no vomiting, no diarrhea.  No further work-up after the urgent care visit.  The patient reports that she has COPD and uses a nebulizer, she did wake up at 1:00 this morning severely dyspneic and had some improvement with the nebulizer.  Additional history obtained from EMS who report that the patient had normal vital signs but was tachypneic prior to their arrival.  Additional history obtained from the chart shows ejection fraction of 55 to 60% as of June 27, 2018.     Past Medical History:  Diagnosis Date  . Bowel obstruction ([place])   . Breast cancer  ([place])   . Breast cancer, stage 3 ([place]) 07/21/2011  . Bronchitis   . CLL (chronic lymphocytic leukemia) ([place]) 07/21/2011  . Migraines   . PAF (paroxysmal atrial fibrillation) ([place])   . Pneumonia     Patient Active Problem List   Diagnosis Date Noted  . Unilateral primary osteoarthritis, left knee 10/24/2018  . COPD with acute exacerbation ([place]) 06/27/2018  . Atrial fibrillation with RVR ([place]) 06/26/2018  . SBO (small bowel obstruction) ([place]) 06/05/2015  . Hyperglycemia 06/05/2015  . Mass of arm 11/15/2011  . CLL (chronic lymphocytic leukemia) ([place]) 07/21/2011  . Breast cancer, stage 3 ([place]) 07/21/2011  . ARTHRITIS, RIGHT HIP 09/23/2008  . Hyperlipidemia 04/25/2008  . OSA (obstructive sleep apnea) 01/16/2008  . FATIGUE 09/14/2007  . ANXIETY 01/08/2007  . Depression 01/08/2007  . ALLERGIC RHINITIS 01/08/2007  . ARTHRITIS 01/08/2007  . HIP PAIN, RIGHT 01/08/2007  . LOW BACK PAIN 01/08/2007  . OSTEOPENIA 01/08/2007  . MIGRAINES, HX OF 01/08/2007    Past Surgical History:  Procedure Laterality Date  . ABDOMINAL HYSTERECTOMY    . APPENDECTOMY    . CHOLECYSTECTOMY    . LAPAROTOMY N/A 06/08/2015   Procedure: EXPLORATORY LAPAROTOMY;  Surgeon: Aviva Signs, MD;  Location: [facility];  Service: General;  Laterality: N/A;  . LYSIS OF ADHESION N/A 06/08/2015   Procedure: LYSIS OF ADHESION;  Surgeon: Aviva Signs, MD;  Location: [facility];  Service: General;  Laterality: N/A;  .  MASTECTOMY     right  . TOTAL HIP ARTHROPLASTY     right     OB History   No obstetric history on file.      Home Medications    Prior to Admission medications   Medication Sig Start Date End Date Taking? Authorizing Provider  albuterol (PROVENTIL HFA;VENTOLIN HFA) 108 (90 BASE) MCG/ACT inhaler Inhale 2 puffs into the lungs every 6 (six) hours as needed for shortness of breath.     [provider]  albuterol (PROVENTIL) (2.5 MG/3ML) 0.083% nebulizer solution Take 3 mLs (2.5 mg total) by nebulization  every 6 (six) hours as needed for wheezing or shortness of breath (when not relieved by inhaler.). 06/27/18   Barton Dubois, MD  apixaban (ELIQUIS) 5 MG TABS tablet Take 1 tablet (5 mg total) by mouth 2 (two) times daily. 03/08/19   Herminio Commons, MD  cholecalciferol (VITAMIN D) 1000 UNITS tablet Take 1,000 Units by mouth daily.    [provider]  gabapentin (NEURONTIN) 100 MG capsule Take 100-200 mg by mouth 2 (two) times daily. Take one capsule in AM, 2 capsules at bedtime 12/17/14   [provider]  metoprolol tartrate (LOPRESSOR) 25 MG tablet Take 0.5 tablets (12.5 mg total) by mouth 2 (two) times daily. 03/08/19 06/06/19  Herminio Commons, MD  omeprazole (PRILOSEC) 20 MG capsule Take 20 mg by mouth at bedtime.     [provider]    Family History Family History  Problem Relation Age of Onset  . Cancer Brother   . Cancer Other     Social History Social History   Tobacco Use  . Smoking status: Former Smoker    Quit date: 10/26/1978    Years since quitting: 40.4  . Smokeless tobacco: Never Used  Substance Use Topics  . Alcohol use: No  . Drug use: No     Allergies   Codeine and Meclizine   Review of Systems Review of Systems  All other systems reviewed and are negative.    Physical Exam Updated Vital Signs BP 97/86   Pulse 60   Temp 98.4 F (36.9 C) (Oral)   Resp (!) 21   Ht 1.549 m (5\' 1" )   Wt 76 kg   SpO2 96%   BMI 31.66 kg/m   Physical Exam Vitals signs and nursing note reviewed.  Constitutional:      General: She is not in acute distress.    Appearance: She is well-developed.  HENT:     Head: Normocephalic and atraumatic.     Mouth/Throat:     Pharynx: No oropharyngeal exudate.  Eyes:     General: No scleral icterus.       Right eye: No discharge.        Left eye: No discharge.     Conjunctiva/sclera: Conjunctivae normal.     Pupils: Pupils are equal, round, and reactive to light.  Neck:     Musculoskeletal:  Normal range of motion and neck supple.     Thyroid: No thyromegaly.     Vascular: No JVD.  Cardiovascular:     Rate and Rhythm: Normal rate and regular rhythm.     Heart sounds: Normal heart sounds. No murmur. No friction rub. No gallop.   Pulmonary:     Effort: Respiratory distress present.     Breath sounds: Normal breath sounds. No wheezing or rales.     Comments: The patient is very tachypneic, she speaks in shortened sentences, her lung  sounds are overall very clear Abdominal:     General: Bowel sounds are normal. There is no distension.     Palpations: Abdomen is soft. There is no mass.     Tenderness: There is no abdominal tenderness.  Musculoskeletal: Normal range of motion.        General: No tenderness.  Lymphadenopathy:     Cervical: No cervical adenopathy.  Skin:    General: Skin is warm and dry.     Findings: No erythema or rash.  Neurological:     Mental Status: She is alert.     Coordination: Coordination normal.  Psychiatric:        Behavior: Behavior normal.      ED Treatments / Results  Labs (all labs ordered are listed, but only abnormal results are displayed) Labs Reviewed  BRAIN NATRIURETIC PEPTIDE - Abnormal; Notable for the following components:      Result Value   B Natriuretic Peptide 1,067.0 (*)    All other components within normal limits  CBC WITH DIFFERENTIAL/PLATELET - Abnormal; Notable for the following components:   WBC 34.0 (*)    RBC 3.77 (*)    Hemoglobin 11.3 (*)    Lymphs Abs 26.7 (*)    Monocytes Absolute 1.4 (*)    Basophils Absolute 0.2 (*)    All other components within normal limits  COMPREHENSIVE METABOLIC PANEL - Abnormal; Notable for the following components:   Chloride 113 (*)    Glucose, Bld 108 (*)    Calcium 8.5 (*)    Total Protein 6.2 (*)    All other components within normal limits  SARS CORONAVIRUS 2 (TAT 6-24 HRS)  TROPONIN I (HIGH SENSITIVITY)  TROPONIN I (HIGH SENSITIVITY)    EKG EKG Interpretation   Date/Time:  Monday March 25 2019 16:30:06 EST Ventricular Rate:  60 PR Interval:    QRS Duration: 153 QT Interval:  471 QTC Calculation: 471 R Axis:   39 Text Interpretation: Sinus rhythm Ventricular premature complex Left bundle branch block Since last tracing rate slower Confirmed by Noemi Chapel [phone]) on 03/25/2019 4:50:55 PM   Radiology Dg Chest Port 1 View  Result Date: 03/25/2019 CLINICAL DATA:  Shortness of breath, worsening for 2 weeks EXAM: PORTABLE CHEST 1 VIEW COMPARISON:  Radiograph 03/13/2019 FINDINGS: Stable cardiomegaly and a calcified, tortuous aorta. Increasing interstitial opacities with septal lines and cephalized, indistinct pulmonary vascularity. Mild central vascular cuffing. There is patchy retrocardiac opacity in the left lung base. Small bilateral pleural effusions are noted. Biapical pleuroparenchymal scarring is seen. Degenerative changes are present in the imaged spine and shoulders. IMPRESSION: 1. Stable cardiomegaly with increasing interstitial pulmonary edema. 2. Small bilateral pleural effusions. 3. More focal opacity in the left lung base could reflect atelectasis or consolidation. 4.  Aortic Atherosclerosis (ICD10-I70.0). Electronically Signed   By: Lovena Le M.D.   On: 03/25/2019 17:36    Procedures Ultrasound ED Echo  Date/Time: 03/25/2019 5:36 PM Performed by: Noemi Chapel, MD Authorized by: Noemi Chapel, MD   Procedure details:    Indications: dyspnea     Views: subxiphoid, parasternal long axis view, parasternal short axis view and apical 4 chamber view     Images: not archived     Limitations:  Acoustic shadowing, body habitus and positioning Findings:    Pericardium: no pericardial effusion     Cardiac Activity: normal cardiac activity     LV Function: depressed (30 - 50%)     RV Diameter: dilated     Other  signs of RV strain: paradoxical septal motion   Impression:    Impression: abnormal cardiac activity and decreased  contractility     (including critical care time)  Medications Ordered in ED Medications  furosemide (LASIX) injection 40 mg (has no administration in time range)     Initial Impression / Assessment and Plan / ED Course  I have reviewed the triage vital signs and the nursing notes.  Pertinent labs & imaging results that were available during my care of the patient were reviewed by me and considered in my medical decision making (see chart for details).  Clinical Course as of Mar 24 1832  Mon Mar 25, 2019  1747 Given the patient's history of leukemia the blood counts are stable, troponin is normal and metabolic panel is unremarkable.  BNP pending   [BM]  [id number] My bedside ultrasound shows that the patient does not fact have what appears to be a depressed ejection fraction with a weak squeeze consistent with congestive heart failure   [BM]    Clinical Course User Index [BM] Noemi Chapel, MD       Absent right breast, the patient has symmetrical legs, she is generally obese but has no edema.  The patient has shortness of breath despite normal lung sounds suggesting that she may have another cause of her symptoms, consider congestive heart failure given the cardiomegaly recently reported.  Would also consider arrhythmia, pulmonary embolism would also raise concern given her history of CLL.  The patient is agreeable to the work-up.  Discussed with the hospitalist Dr. Jamse Arn who will come see the patient for admission   Final Clinical Impressions(s) / ED Diagnoses   Final diagnoses:  Acute on chronic congestive heart failure, unspecified heart failure type [facility])    ED Discharge Orders    None       Noemi Chapel, MD 03/25/19 [phone]

## 2019-03-26 ENCOUNTER — Inpatient Hospital Stay (HOSPITAL_COMMUNITY): Payer: Medicare HMO

## 2019-03-26 DIAGNOSIS — I361 Nonrheumatic tricuspid (valve) insufficiency: Secondary | ICD-10-CM

## 2019-03-26 DIAGNOSIS — I340 Nonrheumatic mitral (valve) insufficiency: Secondary | ICD-10-CM

## 2019-03-26 DIAGNOSIS — E785 Hyperlipidemia, unspecified: Secondary | ICD-10-CM

## 2019-03-26 DIAGNOSIS — I480 Paroxysmal atrial fibrillation: Secondary | ICD-10-CM

## 2019-03-26 DIAGNOSIS — R001 Bradycardia, unspecified: Secondary | ICD-10-CM

## 2019-03-26 DIAGNOSIS — I5031 Acute diastolic (congestive) heart failure: Secondary | ICD-10-CM

## 2019-03-26 LAB — ECHOCARDIOGRAM COMPLETE
Height: 61 in
Weight: 2680.79 oz

## 2019-03-26 LAB — BASIC METABOLIC PANEL
Anion gap: 11 (ref 5–15)
BUN: 16 mg/dL (ref 8–23)
CO2: 23 mmol/L (ref 22–32)
Calcium: 9 mg/dL (ref 8.9–10.3)
Chloride: 106 mmol/L (ref 98–111)
Creatinine, Ser: 0.72 mg/dL (ref 0.44–1.00)
GFR calc Af Amer: >60 mL/min (ref >60)
GFR calc non Af Amer: >60 mL/min (ref >60)
Glucose, Bld: 146 mg/dL — ABNORMAL HIGH (ref 70–99)
Potassium: 3.5 mmol/L (ref 3.5–5.1)
Sodium: 140 mmol/L (ref 135–145)

## 2019-03-26 LAB — SARS CORONAVIRUS 2 (TAT 6-24 HRS): SARS Coronavirus 2: NEGATIVE

## 2019-03-26 LAB — GLUCOSE, CAPILLARY: Glucose-Capillary: 167 mg/dL — ABNORMAL HIGH (ref 70–99)

## 2019-03-26 MED ORDER — FUROSEMIDE 10 MG/ML IJ SOLN
40.0000 mg | Freq: Two times a day (BID) | INTRAMUSCULAR | Status: DC
  Administered 2019-03-26 (×2): 40 mg via INTRAVENOUS
  Filled 2019-03-26 (×3): qty 4

## 2019-03-26 MED ORDER — CARVEDILOL 3.125 MG PO TABS: 3.1250 mg | ORAL_TABLET | Freq: Two times a day (BID) | ORAL | Status: DC

## 2019-03-26 MED ORDER — BISOPROLOL FUMARATE 5 MG PO TABS
2.5000 mg | ORAL_TABLET | Freq: Every day | ORAL | Status: DC
  Administered 2019-03-27: 10:00:00 2.5 mg via ORAL
  Filled 2019-03-26: qty 1

## 2019-03-26 NOTE — Progress Notes (Signed)
*  PRELIMINARY RESULTS* Echocardiogram 2D Echocardiogram has been performed.  [name] 03/26/2019, 1:45 PM

## 2019-03-26 NOTE — Progress Notes (Signed)
PROGRESS NOTE    [name]  [id number] DOB: 1935-09-16 DOA: 03/25/2019 PCP: Lemmie Evens, MD     Brief Narrative:  83 y.o. 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  Patient states that she used to be more active, walked her dog daily but about 3 weeks ago noted that she was increasingly short of breath while walking her dog and even around her small apartment which is unusual for her.  At that time patient had no fevers chills or malaise.  She did have a cough which was newly productive of a clear phlegm. 2 weeks ago she went to see an urgent care physician who diagnosed her with pneumonia and gave her a 10-day course of cefdinir.  She was also told that she had an enlarged heart and was scheduled for an outpatient echocardiogram.  She was ruled out for Covid.  Patient notes that her cough improved however her exercise tolerance did not.  Today patient states that she was quite short of breath just walking to her kitchen which was quite unusual for her.  Patient continues to otherwise feel well.  She has no malaise.  She has no abdominal pain nausea or vomiting.  No loss of smell or taste.  Patient does admit to diarrhea while she was taking her antibiotics however this stopped when she stopped taking her antibiotics.  She does also admit to new onset orthopnea.  She used to sleep on 1 pillow at night now needs 2 pillows to be comfortable.  Patient denies PND. Patient specifically denies any chest pain, back pain, new onset shoulder or neck or jaw pain prior to her new onset dyspnea.  ED Course:  The patient underwent a bedside ED echocardiogram which seem to show decreased ejection fraction as well as some paradoxical septal motion  suggestive of right heart strain.  Patient was given Lasix 40 mg IV and has urinated multiple times per RN and nursing assistant however they are not measuring her urine output.  Patient herself is not sure if she feels better.   Assessment & Plan: 1-acute systolic heart failure -Patient with previous chronic diastolic component in the setting of atrial fibrillation. -Ejection fraction 40 to 45% -Elevated BNP, vascular congestion and interstitial edema on presentation. -Significant improvement after initiation of IV diuresis.  Patient has started patient on bisoprolol-no ACE/ARB in the setting of low blood pressure -Follow cardiology service recommendation.  2-Hyperlipidemia -Continue statins  3-CLL (chronic lymphocytic leukemia) ([facility]) -WBC is a stable-continue patient follow-up with oncology service.  4-PAF (paroxysmal atrial fibrillation) ([facility]) -Continue Eliquis for secondary prevention -Has been started on bisoprolol at this moment -Toprol discontinued in the setting of bradycardia.  5-COPD with chronic bronchitis ([place]) -no antibiotics currently -continue nebulizer treatment -continue steroids with intention for rapid tapering.  6-abnormal lungs findings -no frank infiltrates -will benefit of repeat CT in 3 months.   DVT prophylaxis: Eliquis Code Status: Full code Family Communication: No family at bedside. Disposition Plan: Remains inpatient for IV diuretics, steroids management and further medication adjustment as per cardiology recommendation.  Consultants:   Cardiology service.  Procedures:   See below for x-ray reports.  -2D echo: Demonstrating grade 2 diastolic dysfunction, [id number] ejection fraction, moderate increased left ventricular hypertrophy.  Positive tricuspid  regurgitation.  Antimicrobials:  Anti-infectives (From admission, onward)   None       Subjective: Afebrile, no chest pain, no nausea or vomiting.  Reports significant improvement in her  breathing and expressed good urine output.  Objective: Vitals:   03/26/19 0807 03/26/19 1212 03/26/19 1330 03/26/19 1553  BP:   (!) 108/59   Pulse:   66   Resp:   20   Temp:   98.4 F (36.9 C)   TempSrc:   Oral   SpO2: 99% 99% 93% 99%  Weight:      Height:        Intake/Output Summary (Last 24 hours) at 03/26/2019 1634 Last data filed at 03/26/2019 1100 Gross per 24 hour  Intake 637 ml  Output 1700 ml  Net -1063 ml   Filed Weights   03/25/19 1624  Weight: 76 kg    Examination: General exam: Alert, awake, oriented x 3; feeling much better and is present improvement in her breathing.  No requiring oxygen supplementation at this time. Respiratory system: Decreased breath sounds at the bases; fine crackles on examination.  No wheezing at this moment, no using accessory muscles.  Cardiovascular system: Rate controlled. No rubs or gallops.  No JVD. Gastrointestinal system: Abdomen is nondistended, soft and nontender. No organomegaly or masses felt. Normal bowel sounds heard. Central nervous system: Alert and oriented. No focal neurological deficits. Extremities: No cyanosis or clubbing. Skin: No rashes, no petechiae. Psychiatry: Judgement and insight appear normal. Mood & affect appropriate.     Data Reviewed: I have personally reviewed following labs and imaging studies  CBC: Recent Labs  Lab 03/25/19 1628  WBC 34.0*  NEUTROABS 5.5  HGB 11.3*  HCT 36.4  MCV 96.6  PLT 123456   Basic Metabolic Panel: Recent Labs  Lab 03/25/19 1628 03/26/19 0531  NA 141 140  K 3.7 3.5  CL 113* 106  CO2 23 23  GLUCOSE 108* 146*  BUN 16 16  CREATININE 0.74 0.72  CALCIUM 8.5* 9.0   GFR: Estimated Creatinine Clearance: 51.5 mL/min (by C-G formula based on SCr of 0.72 mg/dL).   Liver Function Tests: Recent Labs  Lab 03/25/19 1628  AST 24  ALT 28  ALKPHOS 39  BILITOT 1.0  PROT 6.2*  ALBUMIN 3.9   Thyroid Function Tests: Recent Labs    03/25/19 1859  TSH 2.477    Urine analysis:    Component Value Date/Time   COLORURINE YELLOW 06/05/2015 [place] 06/05/2015 1303   LABSPEC >1.030 (H) 06/05/2015 1303   PHURINE 5.5 06/05/2015 1303   GLUCOSEU NEGATIVE 06/05/2015 1303   HGBUR MODERATE (A) 06/05/2015 1303   BILIRUBINUR NEGATIVE 06/05/2015 1303   KETONESUR TRACE (A) 06/05/2015 1303   PROTEINUR TRACE (A) 06/05/2015 1303   UROBILINOGEN 0.2 10/17/2008 1128   NITRITE NEGATIVE 06/05/2015 1303   LEUKOCYTESUR NEGATIVE 06/05/2015 1303    Recent Results (from the past 240 hour(s))  SARS CORONAVIRUS 2 (TAT 6-24 HRS) Nasopharyngeal Nasopharyngeal Swab     Status: None   Collection Time: 03/25/19  4:40 PM   Specimen: Nasopharyngeal Swab  Result Value Ref Range Status   SARS Coronavirus 2 NEGATIVE NEGATIVE Final    Comment: (NOTE) SARS-CoV-2 target nucleic acids are NOT DETECTED. The SARS-CoV-2 RNA is generally detectable in upper and lower respiratory specimens during the acute phase of infection. Negative results do not preclude SARS-CoV-2 infection, do not rule out co-infections with other pathogens, and should not be used as the sole basis  for treatment or other patient management decisions. Negative results must be combined with clinical observations, patient history, and epidemiological information. The expected result is Negative. Fact Sheet for Patients: SugarRoll.be Fact Sheet for Healthcare Providers: https://www.woods-mathews.com/ This test is not yet approved or cleared by the [place] FDA and  has been authorized for detection and/or diagnosis of SARS-CoV-2 by FDA under an Emergency Use Authorization (EUA). This EUA will remain  in effect (meaning this test can be used) for the duration of the COVID-19 declaration under Section 56 4(b)(1) of the Act, 21 U.S.C. section 360bbb-3(b)(1), unless the authorization is terminated or revoked sooner. Performed at [facility], [place] [address], [place], [place] [address]      Radiology Studies: Ct Chest Wo Contrast  Result Date: 03/26/2019 CLINICAL DATA:  Increasing shortness of breath, possible left retrocardiac infiltrate EXAM: CT CHEST WITHOUT CONTRAST TECHNIQUE: Multidetector CT imaging of the chest was performed following the standard protocol without IV contrast. COMPARISON:  Chest x-ray from the previous day, CT from 02/05/2010. FINDINGS: Cardiovascular: Thoracic aorta demonstrates atherosclerotic calcifications. No aneurysmal dilatation is seen. No cardiac enlargement is noted. Heavy coronary calcifications are seen. Lack of IV contrast somewhat limits evaluation of the vascular structures. Mediastinum/Nodes: Thoracic inlet again demonstrates a prominent inferior lobe of the left thyroid but it is stable from the prior exam. Given its long-term stability it is felt to be benign in etiology. Scattered small mediastinal lymph nodes are seen. These are not significant by size criteria. The esophagus is within normal limits. Lungs/Pleura: Small right pleural effusion is noted. The degree of vascular congestion and edema seen on the previous day has resolved. No sizable parenchymal nodules are seen. Mild right basilar atelectasis is noted. There is some irregular density seen in the medial aspect of the left lung base. This measures approximately 17 mm in greatest dimension with a small amount of associated pleural thickening/pleural effusion. This was not well seen on the prior CT from 2011. This is best visualized on image number 114 of series 2. Upper Abdomen: Visualized upper abdomen is unremarkable. Gallbladder has been surgically removed. Musculoskeletal: Degenerative changes of the thoracic spine are noted. IMPRESSION: 17 mm irregular soft tissue density along the medial aspect of the left lung base as described. There is some associated pleural thickening/pleural effusion on the left which is small. This appears new from  the prior exam and would correspond with the retrocardiac density seen previously. Consider one of the following in 3 months for both low-risk and high-risk individuals: (a) repeat chest CT, (b) follow-up PET-CT, or (c) tissue sampling. This recommendation follows the consensus statement: Guidelines for Management of Incidental Pulmonary Nodules Detected on CT Images: From the Fleischner Society 2017; Radiology 2017; 284:228-243. Right-sided pleural effusion consistent with that seen on recent chest x-ray. Mild associated right lower lobe atelectasis is noted. Interval resolution of vascular congestion and interstitial edema. Aortic Atherosclerosis (ICD10-I70.0). Electronically Signed   By: Inez Catalina M.D.   On: 03/26/2019 14:32   Dg Chest Port 1 View  Result Date: 03/25/2019 CLINICAL DATA:  Shortness of breath, worsening for 2 weeks EXAM: PORTABLE CHEST 1 VIEW COMPARISON:  Radiograph 03/13/2019 FINDINGS: Stable cardiomegaly and a calcified, tortuous aorta. Increasing interstitial opacities with septal lines and cephalized, indistinct pulmonary vascularity. Mild central vascular cuffing. There is patchy retrocardiac opacity in the left lung base. Small bilateral pleural effusions are noted. Biapical pleuroparenchymal scarring is seen. Degenerative changes are present in the imaged spine and shoulders. IMPRESSION:  1. Stable cardiomegaly with increasing interstitial pulmonary edema. 2. Small bilateral pleural effusions. 3. More focal opacity in the left lung base could reflect atelectasis or consolidation. 4.  Aortic Atherosclerosis (ICD10-I70.0). Electronically Signed   By: Lovena Le M.D.   On: 03/25/2019 17:36     Scheduled Meds:  apixaban  5 mg Oral BID   [START ON 03/27/2019] bisoprolol  2.5 mg Oral Daily   furosemide  40 mg Intravenous Q12H   ipratropium-albuterol  3 mL Nebulization QID   methylPREDNISolone (SOLU-MEDROL) injection  40 mg Intravenous Q6H   Continuous Infusions:   LOS: 1  day    Time spent: 30 minutes.    Barton Dubois, MD [facility] Pager [phone]   03/26/2019, 4:34 PM

## 2019-03-26 NOTE — Consult Note (Addendum)
Cardiology Consultation:   Patient ID: [name] MRN: [id number]; DOB: Jun 09, 1935  Admit date: 03/25/2019 Date of Consult: 03/26/2019  Primary Care Provider: Lemmie Evens, MD Primary Cardiologist: Kate Sable, MD   Primary Electrophysiologist:  None     Patient Profile:   [name] is a 83 y.o. female with a hx of PAF who is being seen today for the evaluation of CHF at the request of Dr. Jamse Arn.  History of Present Illness:   [name] is an 83 year old female patient of Dr. Bronson Ing with history of PAF, CLL and COPD.  She saw Dr. Bronson Ing 03/08/2019 at which time she had stopped her Toprol and Eliquis because she had no refills.  Was restarted on both that day.  Blood pressure was soft so she was started on metoprolol 12.5 mg twice daily.  2D echo 06/2018 LVEF 55 to 60% with impaired diastolic relaxation and moderately dilated left atrium mild aortic stenosis.  Patient came to the ER with 3-week history of worsening dyspnea on exertion.  She was recently at urgent care and given a course of antibiotics for pneumonia and told she had enlarged heart.  Bedside 2D echo in the ER showed reduced ejection fraction 30 to 50% with signs of RV strain and paradoxical septal motion.  She was treated with IV Lasix as well as Solu-Medrol in the emergency room.  Chest x-ray shows left lower lobe infiltrate and CT scan is ordered for today. She says her symptoms started 2 days after starting toprol back. Felt her heart flutter before coming in. She feels so much better today. Breathing back to normal. Denies chest pain, palpitations,dizziness or presyncope or edema.  Heart Pathway Score:     Past Medical History:  Diagnosis Date  . Bowel obstruction ([place])   . Breast cancer ([place])   . Breast cancer, stage 3 ([place]) 07/21/2011  . Bronchitis   . CLL (chronic lymphocytic leukemia) ([place]) 07/21/2011  . Migraines   . PAF (paroxysmal atrial fibrillation) ([place])   . Pneumonia      Past Surgical History:  Procedure Laterality Date  . ABDOMINAL HYSTERECTOMY    . APPENDECTOMY    . CHOLECYSTECTOMY    . LAPAROTOMY N/A 06/08/2015   Procedure: EXPLORATORY LAPAROTOMY;  Surgeon: Aviva Signs, MD;  Location: [facility];  Service: General;  Laterality: N/A;  . LYSIS OF ADHESION N/A 06/08/2015   Procedure: LYSIS OF ADHESION;  Surgeon: Aviva Signs, MD;  Location: [facility];  Service: General;  Laterality: N/A;  . MASTECTOMY     right  . TOTAL HIP ARTHROPLASTY     right     Home Medications:  Prior to Admission medications   Medication Sig Start Date End Date Taking? Authorizing Provider  acetaminophen (TYLENOL) 500 MG tablet Take 500 mg by mouth every 6 (six) hours as needed for mild pain, moderate pain or headache.   Yes [provider]  albuterol (PROVENTIL HFA;VENTOLIN HFA) 108 (90 BASE) MCG/ACT inhaler Inhale 2 puffs into the lungs every 6 (six) hours as needed for shortness of breath.    Yes [provider]  albuterol (PROVENTIL) (2.5 MG/3ML) 0.083% nebulizer solution Take 3 mLs (2.5 mg total) by nebulization every 6 (six) hours as needed for wheezing or shortness of breath (when not relieved by inhaler.). 06/27/18  Yes Barton Dubois, MD  apixaban (ELIQUIS) 5 MG TABS tablet Take 1 tablet (5 mg total) by mouth 2 (two) times daily. 03/08/19  Yes Herminio Commons, MD  cholecalciferol (VITAMIN  D) 1000 UNITS tablet Take 1,000 Units by mouth daily.   Yes [provider]  diphenoxylate-atropine (LOMOTIL) 2.5-0.025 MG tablet Take 1-2 tablets by mouth every 8 (eight) hours as needed for diarrhea or loose stools.  03/18/19  Yes [provider]  metoprolol tartrate (LOPRESSOR) 25 MG tablet Take 0.5 tablets (12.5 mg total) by mouth 2 (two) times daily. 03/08/19 06/06/19 Yes Herminio Commons, MD  omeprazole (PRILOSEC) 20 MG capsule Take 20 mg by mouth at bedtime.    Yes [provider]  cefdinir (OMNICEF) 300 MG capsule Take 300 mg by mouth  2 (two) times daily. 10 day course starting on 03/12/2019 03/12/19   [provider]    Inpatient Medications: Scheduled Meds: . apixaban  5 mg Oral BID  . aspirin EC  81 mg Oral Daily  . ipratropium-albuterol  3 mL Nebulization QID  . methylPREDNISolone (SOLU-MEDROL) injection  40 mg Intravenous Q6H  . metoprolol tartrate  12.5 mg Oral BID   Continuous Infusions:  PRN Meds: acetaminophen **OR** acetaminophen  Allergies:    Allergies  Allergen Reactions  . Codeine Nausea And Vomiting    headache  . Meclizine Other (See Comments)    Made dizziness worse.  . Sulfa Antibiotics Nausea And Vomiting    Stomach upset    Social History:   Social History   Socioeconomic History  . Marital status: Widowed    Spouse name: Not on file  . Number of children: Not on file  . Years of education: 67  . Highest education level: Not on file  Occupational History  . Not on file  Social Needs  . Financial resource strain: Patient refused  . Food insecurity    Worry: Patient refused    Inability: Patient refused  . Transportation needs    Medical: Patient refused    Non-medical: Patient refused  Tobacco Use  . Smoking status: Former Smoker    Quit date: 10/26/1978    Years since quitting: 40.4  . Smokeless tobacco: Never Used  Substance and Sexual Activity  . Alcohol use: No  . Drug use: No  . Sexual activity: Not Currently  Lifestyle  . Physical activity    Days per week: Patient refused    Minutes per session: Patient refused  . Stress: Rather much  Relationships  . Social Herbalist on phone: Patient refused    Gets together: Patient refused    Attends religious service: Patient refused    Active member of club or organization: Patient refused    Attends meetings of clubs or organizations: Patient refused    Relationship status: Patient refused  . Intimate partner violence    Fear of current or ex partner: Patient refused    Emotionally abused:  Patient refused    Physically abused: Patient refused    Forced sexual activity: Patient refused  Other Topics Concern  . Not on file  Social History Narrative   Pt lives by herself    Family History:     Family History  Problem Relation Age of Onset  . Cancer Brother   . Cancer Other      ROS:  Please see the history of present illness.  Review of Systems  Constitution: Negative.  HENT: Negative.   Eyes: Negative.   Cardiovascular: Negative.   Respiratory: Negative.   Hematologic/Lymphatic: Negative.   Musculoskeletal: Negative.  Negative for joint pain.  Gastrointestinal: Negative.   Genitourinary: Negative.   Neurological: Negative.  All other ROS reviewed and negative.     Physical Exam/Data:   Vitals:   03/25/19 2125 03/25/19 2144 03/25/19 2200 03/25/19 2311  BP:  126/75 (!) 113/51 111/72  Pulse: 61 65 62 67  Resp: (!) 24 (!) 25 (!) 25 16  Temp:    99.1 F (37.3 C)  TempSrc:    Oral  SpO2: 97% 95% 95% 100%  Weight:      Height:        Intake/Output Summary (Last 24 hours) at 03/26/2019 0749 Last data filed at 03/26/2019 0100 Gross per 24 hour  Intake -  Output 1200 ml  Net -1200 ml   Last 3 Weights 03/25/2019 03/08/2019 10/24/2018  Weight (lbs) 167 lb 8.8 oz 169 lb 160 lb  Weight (kg) 76 kg 76.658 kg 72.576 kg     Body mass index is 31.66 kg/m.  General:  Well nourished, well developed, in no acute distress  HEENT: normal Lymph: no adenopathy Neck: no JVD Endocrine:  No thryomegaly Vascular: No carotid bruits; FA pulses 2+ bilaterally without bruits  Cardiac:  normal S1, S2; RRR; 1/6 systolic murmur LSB Lungs:  Crackles at bases bilaterally  Abd: soft, nontender, no hepatomegaly  Ext: no edema Musculoskeletal:  No deformities, BUE and BLE strength normal and equal Skin: warm and dry  Neuro:  CNs 2-12 intact, no focal abnormalities noted Psych:  Normal affect   EKG:  The EKG was personally reviewed and demonstrates:  NSR at 60/m with LBBB  was in Afib 06/2018 Telemetry:  Telemetry was personally reviewed and demonstrates:  Sinus bradycardia 47/m with wondering atrial pacemaker and PAC's  Relevant CV Studies: Event Monitor: 05/2017  Sinus rhythm with occasional PVCs. No sustained arrhythmias.  Symptoms of fatigue correlated with PVCs.   Echocardiogram: 06/27/2018 IMPRESSIONS    1. The left ventricle has normal systolic function of 0000000. The cavity size is normal. There is mildly increased left ventricular wall thickness. Echo evidence of impaired diastolic relaxation.  2. The right ventricle has normal systolic function. The cavity in normal in size. There is no increase in right ventricular wall thickness. Estimated RVSP 29 mmHg.  3. Moderately dilated left atrial size.  4. The mitral valve is normal in structure There is moderate mitral annular calcification present.  5. The pulmonic valve is grossly normal.  6. The aortic root is normal in size and structure. Trileaflet aortic valve with mild to moderate annular calcification and mild aortic stenosis, mean gradient 10.7 mmHg and valve area 1.6 cm2 by planimetry.   Bedside 2D echo Date/Time: 03/25/2019 5:36 PM  Performed by: Noemi Chapel, MD  Authorized by: Noemi Chapel, MD   Procedure details:    Indications: dyspnea     Views: subxiphoid, parasternal long axis view, parasternal short axis  view and apical 4 chamber view     Images: not archived     Limitations:  Acoustic shadowing, body habitus and positioning  Findings:    Pericardium: no pericardial effusion     Cardiac Activity: normal cardiac activity     LV Function: depressed (30 - 50%)     RV Diameter: dilated     Other signs of RV strain: paradoxical septal motion   Impression:    Impression: abnormal cardiac activity and decreased contractility    Last Resulted: 03/25/19 16:28       Laboratory Data:  High Sensitivity Troponin:   Recent Labs  Lab 03/25/19 1628 03/25/19 1859  TROPONINIHS 7  8  Chemistry Recent Labs  Lab 03/25/19 1628 03/26/19 0531  NA 141 140  K 3.7 3.5  CL 113* 106  CO2 23 23  GLUCOSE 108* 146*  BUN 16 16  CREATININE 0.74 0.72  CALCIUM 8.5* 9.0  GFRNONAA >60 >60  GFRAA >60 >60  ANIONGAP 5 11    Recent Labs  Lab 03/25/19 1628  PROT 6.2*  ALBUMIN 3.9  AST 24  ALT 28  ALKPHOS 39  BILITOT 1.0   Hematology Recent Labs  Lab 03/25/19 1628  WBC 34.0*  RBC 3.77*  HGB 11.3*  HCT 36.4  MCV 96.6  MCH 30.0  MCHC 31.0  RDW 14.6  PLT 213   BNP Recent Labs  Lab 03/25/19 1628  BNP 1,067.0*    DDimer No results for input(s): DDIMER in the last 168 hours.   Radiology/Studies:  Dg Chest Port 1 View  Result Date: 03/25/2019 CLINICAL DATA:  Shortness of breath, worsening for 2 weeks EXAM: PORTABLE CHEST 1 VIEW COMPARISON:  Radiograph 03/13/2019 FINDINGS: Stable cardiomegaly and a calcified, tortuous aorta. Increasing interstitial opacities with septal lines and cephalized, indistinct pulmonary vascularity. Mild central vascular cuffing. There is patchy retrocardiac opacity in the left lung base. Small bilateral pleural effusions are noted. Biapical pleuroparenchymal scarring is seen. Degenerative changes are present in the imaged spine and shoulders. IMPRESSION: 1. Stable cardiomegaly with increasing interstitial pulmonary edema. 2. Small bilateral pleural effusions. 3. More focal opacity in the left lung base could reflect atelectasis or consolidation. 4.  Aortic Atherosclerosis (ICD10-I70.0). Electronically Signed   By: Lovena Le M.D.   On: 03/25/2019 17:36    Assessment and Plan:   1. CHF-appears to be combined systolic and diastolic based on bedside echo with reduced LV function in ER yesterday.  BNP over 1000.  Diuresed 1.2 L overnight.  Weight 169 same as in the office 03/08/2019. Feels much better. Still with crackles at bases ? Residual CHF vs pneumonia. Await CT. Need complete echo-scheduled as outpatient next Monday. Will order for  today. HR in 40's.toprol just restarted last month. Will stop and start low dose coreg with LV dysfunction. Could benefit from ACEI/ARB but BP on low side. Crt normal. Await echo. 2. PAF on Eliquis and metoprolol just restarted last month after a lapse in treatment 3. COPD with recent exacerbation treated with antibiotics as an outpatient and infiltrates on chest x-ray for CT today 4. CLL      For questions or updates, please contact [place] Please consult [email or web address] for contact info under     Signed, Ermalinda Barrios, PA-C  03/26/2019 7:49 AM    Attending note:  Patient seen and examined.  I reviewed her records and discussed the case with [name], I agree with her above findings and recommendations.  She presents to the hospital with approximately 2 to 3-week history of shortness of breath and orthopnea, no chest pain or palpitations.  She saw Dr. Bronson Ing in mid October at which point she was placed back on Toprol-XL and Eliquis, having run out of the medications.  She was seen in urgent care recently and started on antibiotics for apparent pneumonia, was also told that she had a "enlarged heart" with a bedside echocardiogram apparently done at that facility indicating an LVEF of "30 to 50%."  Last complete echocardiogram in February revealed LVEF 55 to 60%.  On examination this morning she states that she feels much better.  She is afebrile, systolic blood pressure [id number] range, heart rate in the  50s to 60s in sinus rhythm by telemetry which I personally reviewed.  Lungs exhibit decreased breath sounds with rhonchi, no crackles.  Cardiac exam reveals RRR without gallop.  Lab work shows potassium 3.5, BUN 16, creatinine 0.72, BNP 1067, high-sensitivity troponin I normal range, hemoglobin 11.3, platelets 213, TSH 2.47.  Chest x-ray indicates interstitial edema with small bilateral pleural effusions, left basilar opacity suggesting possible infiltrate versus atelectasis.  I  personally reviewed her ECG which shows sinus bradycardia with left bundle branch block and PVC.  Patient presents with shortness of breath, possible acute diastolic heart failure complicated by symptomatic bradycardia.  Toprol-XL has been held at this point, she is improving with IV Lasix.  We will obtain a complete echocardiogram for assessment of cardiac structure and function particularly in light of recent reported bedside echocardiographic findings in urgent care.  This will help to better guide further medical therapy.  Would stop aspirin.  Satira Sark, M.D., F.A.C.C.

## 2019-03-27 DIAGNOSIS — I5021 Acute systolic (congestive) heart failure: Secondary | ICD-10-CM

## 2019-03-27 DIAGNOSIS — I5041 Acute combined systolic (congestive) and diastolic (congestive) heart failure: Secondary | ICD-10-CM

## 2019-03-27 DIAGNOSIS — I5043 Acute on chronic combined systolic (congestive) and diastolic (congestive) heart failure: Secondary | ICD-10-CM

## 2019-03-27 LAB — MAGNESIUM: Magnesium: 2.1 mg/dL (ref 1.7–2.4)

## 2019-03-27 LAB — BASIC METABOLIC PANEL
Anion gap: 17 — ABNORMAL HIGH (ref 5–15)
BUN: 32 mg/dL — ABNORMAL HIGH (ref 8–23)
CO2: 23 mmol/L (ref 22–32)
Calcium: 9.6 mg/dL (ref 8.9–10.3)
Chloride: 100 mmol/L (ref 98–111)
Creatinine, Ser: 1.03 mg/dL — ABNORMAL HIGH (ref 0.44–1.00)
GFR calc Af Amer: 59 mL/min — ABNORMAL LOW (ref >60)
GFR calc non Af Amer: 51 mL/min — ABNORMAL LOW (ref >60)
Glucose, Bld: 202 mg/dL — ABNORMAL HIGH (ref 70–99)
Potassium: 3.2 mmol/L — ABNORMAL LOW (ref 3.5–5.1)
Sodium: 140 mmol/L (ref 135–145)

## 2019-03-27 MED ORDER — SPIRONOLACTONE 25 MG PO TABS: 12.5000 mg | ORAL_TABLET | Freq: Every day | ORAL | 1 refills | Status: DC

## 2019-03-27 MED ORDER — BISOPROLOL FUMARATE 5 MG PO TABS: 2.5000 mg | ORAL_TABLET | Freq: Every day | ORAL | 1 refills | Status: DC

## 2019-03-27 MED ORDER — IPRATROPIUM-ALBUTEROL 0.5-2.5 (3) MG/3ML IN SOLN
3.0000 mL | Freq: Three times a day (TID) | RESPIRATORY_TRACT | Status: DC
  Administered 2019-03-27: 13:00:00 3 mL via RESPIRATORY_TRACT
  Filled 2019-03-27: qty 3

## 2019-03-27 MED ORDER — POTASSIUM CHLORIDE CRYS ER 20 MEQ PO TBCR
40.0000 meq | EXTENDED_RELEASE_TABLET | Freq: Once | ORAL | Status: AC
  Administered 2019-03-27: 40 meq via ORAL
  Filled 2019-03-27: qty 2

## 2019-03-27 MED ORDER — SPIRONOLACTONE 25 MG PO TABS
12.5000 mg | ORAL_TABLET | Freq: Every day | ORAL | Status: DC
  Administered 2019-03-27: 10:00:00 12.5 mg via ORAL
  Filled 2019-03-27 (×3): qty 0.5
  Filled 2019-03-27: qty 1

## 2019-03-27 NOTE — Progress Notes (Signed)
States feeling good and has walked in hallway.  Edema has improved and to be discharged.  Scripts sent to pharmacy.  IV removed.  Sister to drive home

## 2019-03-27 NOTE — Care Management Important Message (Signed)
Important Message  Patient Details  Name: [name] MRN: [id number] Date of Birth: 1935/09/16   Medicare Important Message Given:  Yes     Tommy Medal 03/27/2019, 12:42 PM

## 2019-03-27 NOTE — Discharge Summary (Signed)
Physician Discharge Summary  [name] [id number] DOB: 23-Dec-1935 DOA: 03/25/2019  PCP: Lemmie Evens, MD  Admit date: 03/25/2019 Discharge date: 03/27/2019  Admitted From: Home Disposition:  Home  Recommendations for Outpatient Follow-up:  1. Follow up with PCP in 1-2 weeks 2. Follow up Dr. Delton Coombes on 04/10/19 at 1420     Discharge Condition: Stable CODE STATUS: FULL Diet recommendation: Heart Healthy   Brief/Interim Summary: 83 year old female patient of Dr. Bronson Ing with history of PAF, CLL and COPD with 3-week history of worsening dyspnea on exertion.  She was recently at urgent care and given a course of antibiotics for pneumonia and told she had enlarged heart.  Bedside 2D echo in the ER showed reduced ejection fraction 30 to 50% with signs of RV strain and paradoxical septal motion.  She was treated with IV Lasix as well as Solu-Medrol in the emergency room.  Chest x-ray shows left lower lobe infiltrate and increased interstitial markings.  She was started on IV solumedrol and IV lasix with clinical improvement.  Echo showed EF 40-45%.  Cardiology was consulted to assist.  Discharge Diagnoses:  acute systolic and diastolic heart failure -[id number] Echo--Ejection fraction 40 to 45%, grade 2 DD -Elevated BNP, vascular congestion and interstitial edema on presentation. -Significant improvement after initiation of IV diuresis.  Patient has started patient on bisoprolol and spironolactone --no ACE/ARB in the setting of soft blood pressures -appreciate cardiology followup   CLL (chronic lymphocytic leukemia) -WBC is a stable-continue patient follow-up with oncology service.  PAF (paroxysmal atrial fibrillation) -Continue Eliquis for secondary prevention -Has been started on bisoprolol  -Toprol discontinued in the setting of bradycardia.  COPD with acute bronchitis  -no antibiotics currently -continue nebulizer treatment -improved with 2-3 days of IV  steroids  abnormal CT chest -no frank infiltrates - 17 mm irregular soft tissue density along the medial aspect of the left lung base -follow up Dr. Delton Coombes on 04/10/19   Discharge Instructions   Allergies as of 03/27/2019      Reactions   Codeine Nausea And Vomiting   headache   Meclizine Other (See Comments)   Made dizziness worse.   Sulfa Antibiotics Nausea And Vomiting   Stomach upset      Medication List    STOP taking these medications   cefdinir 300 MG capsule Commonly known as: OMNICEF   metoprolol tartrate 25 MG tablet Commonly known as: LOPRESSOR     TAKE these medications   acetaminophen 500 MG tablet Commonly known as: TYLENOL Take 500 mg by mouth every 6 (six) hours as needed for mild pain, moderate pain or headache.   albuterol 108 (90 Base) MCG/ACT inhaler Commonly known as: VENTOLIN HFA Inhale 2 puffs into the lungs every 6 (six) hours as needed for shortness of breath.   albuterol (2.5 MG/3ML) 0.083% nebulizer solution Commonly known as: PROVENTIL Take 3 mLs (2.5 mg total) by nebulization every 6 (six) hours as needed for wheezing or shortness of breath (when not relieved by inhaler.).   apixaban 5 MG Tabs tablet Commonly known as: Eliquis Take 1 tablet (5 mg total) by mouth 2 (two) times daily.   bisoprolol 5 MG tablet Commonly known as: ZEBETA Take 0.5 tablets (2.5 mg total) by mouth daily. Start taking on: March 28, 2019   cholecalciferol 1000 units tablet Commonly known as: VITAMIN D Take 1,000 Units by mouth daily.   diphenoxylate-atropine 2.5-0.025 MG tablet Commonly known as: LOMOTIL Take 1-2 tablets by mouth every 8 (eight) hours as needed  for diarrhea or loose stools.   omeprazole 20 MG capsule Commonly known as: PRILOSEC Take 20 mg by mouth at bedtime.   spironolactone 25 MG tablet Commonly known as: ALDACTONE Take 0.5 tablets (12.5 mg total) by mouth daily. Start taking on: March 28, 2019      Follow-up  Information    Erma Heritage, [place] Follow up on 04/04/2019.   Specialties: Physician Assistant, Cardiology Why: Cardiology Hospital Follow-up on 04/04/2019 at 1:30 PM.  Contact information: [place] [address] [phone]          Allergies  Allergen Reactions   Codeine Nausea And Vomiting    headache   Meclizine Other (See Comments)    Made dizziness worse.   Sulfa Antibiotics Nausea And Vomiting    Stomach upset    Consultations:  cardiology   Procedures/Studies: Ct Chest Wo Contrast  Result Date: 03/26/2019 CLINICAL DATA:  Increasing shortness of breath, possible left retrocardiac infiltrate EXAM: CT CHEST WITHOUT CONTRAST TECHNIQUE: Multidetector CT imaging of the chest was performed following the standard protocol without IV contrast. COMPARISON:  Chest x-ray from the previous day, CT from 02/05/2010. FINDINGS: Cardiovascular: Thoracic aorta demonstrates atherosclerotic calcifications. No aneurysmal dilatation is seen. No cardiac enlargement is noted. Heavy coronary calcifications are seen. Lack of IV contrast somewhat limits evaluation of the vascular structures. Mediastinum/Nodes: Thoracic inlet again demonstrates a prominent inferior lobe of the left thyroid but it is stable from the prior exam. Given its long-term stability it is felt to be benign in etiology. Scattered small mediastinal lymph nodes are seen. These are not significant by size criteria. The esophagus is within normal limits. Lungs/Pleura: Small right pleural effusion is noted. The degree of vascular congestion and edema seen on the previous day has resolved. No sizable parenchymal nodules are seen. Mild right basilar atelectasis is noted. There is some irregular density seen in the medial aspect of the left lung base. This measures approximately 17 mm in greatest dimension with a small amount of associated pleural thickening/pleural effusion. This was not well seen on the prior CT from  2011. This is best visualized on image number 114 of series 2. Upper Abdomen: Visualized upper abdomen is unremarkable. Gallbladder has been surgically removed. Musculoskeletal: Degenerative changes of the thoracic spine are noted. IMPRESSION: 17 mm irregular soft tissue density along the medial aspect of the left lung base as described. There is some associated pleural thickening/pleural effusion on the left which is small. This appears new from the prior exam and would correspond with the retrocardiac density seen previously. Consider one of the following in 3 months for both low-risk and high-risk individuals: (a) repeat chest CT, (b) follow-up PET-CT, or (c) tissue sampling. This recommendation follows the consensus statement: Guidelines for Management of Incidental Pulmonary Nodules Detected on CT Images: From the Fleischner Society 2017; Radiology 2017; 284:228-243. Right-sided pleural effusion consistent with that seen on recent chest x-ray. Mild associated right lower lobe atelectasis is noted. Interval resolution of vascular congestion and interstitial edema. Aortic Atherosclerosis (ICD10-I70.0). Electronically Signed   By: Inez Catalina M.D.   On: 03/26/2019 14:32   Dg Chest Port 1 View  Result Date: 03/25/2019 CLINICAL DATA:  Shortness of breath, worsening for 2 weeks EXAM: PORTABLE CHEST 1 VIEW COMPARISON:  Radiograph 03/13/2019 FINDINGS: Stable cardiomegaly and a calcified, tortuous aorta. Increasing interstitial opacities with septal lines and cephalized, indistinct pulmonary vascularity. Mild central vascular cuffing. There is patchy retrocardiac opacity in the left lung base. Small bilateral  pleural effusions are noted. Biapical pleuroparenchymal scarring is seen. Degenerative changes are present in the imaged spine and shoulders. IMPRESSION: 1. Stable cardiomegaly with increasing interstitial pulmonary edema. 2. Small bilateral pleural effusions. 3. More focal opacity in the left lung base could  reflect atelectasis or consolidation. 4.  Aortic Atherosclerosis (ICD10-I70.0). Electronically Signed   By: Lovena Le M.D.   On: 03/25/2019 17:36         Discharge Exam: Vitals:   03/27/19 1317 03/27/19 1321  BP: (!) 119/97   Pulse: (!) 53   Resp: 18   Temp: 98.4 F (36.9 C)   SpO2: 97% 96%   Vitals:   03/27/19 0512 03/27/19 0740 03/27/19 1317 03/27/19 1321  BP: 93/62  (!) 119/97   Pulse: 66  (!) 53   Resp: 18  18   Temp: 98.2 F (36.8 C)  98.4 F (36.9 C)   TempSrc: Oral  Oral   SpO2: 97% 96% 97% 96%  Weight:      Height:        General: Pt is alert, awake, not in acute distress Cardiovascular: RRR, S1/S2 +, no rubs, no gallops Respiratory: CTA bilaterally, no wheezing, no rhonchi Abdominal: Soft, NT, ND, bowel sounds + Extremities: no edema, no cyanosis   The results of significant diagnostics from this hospitalization (including imaging, microbiology, ancillary and laboratory) are listed below for reference.    Significant Diagnostic Studies: Ct Chest Wo Contrast  Result Date: 03/26/2019 CLINICAL DATA:  Increasing shortness of breath, possible left retrocardiac infiltrate EXAM: CT CHEST WITHOUT CONTRAST TECHNIQUE: Multidetector CT imaging of the chest was performed following the standard protocol without IV contrast. COMPARISON:  Chest x-ray from the previous day, CT from 02/05/2010. FINDINGS: Cardiovascular: Thoracic aorta demonstrates atherosclerotic calcifications. No aneurysmal dilatation is seen. No cardiac enlargement is noted. Heavy coronary calcifications are seen. Lack of IV contrast somewhat limits evaluation of the vascular structures. Mediastinum/Nodes: Thoracic inlet again demonstrates a prominent inferior lobe of the left thyroid but it is stable from the prior exam. Given its long-term stability it is felt to be benign in etiology. Scattered small mediastinal lymph nodes are seen. These are not significant by size criteria. The esophagus is within  normal limits. Lungs/Pleura: Small right pleural effusion is noted. The degree of vascular congestion and edema seen on the previous day has resolved. No sizable parenchymal nodules are seen. Mild right basilar atelectasis is noted. There is some irregular density seen in the medial aspect of the left lung base. This measures approximately 17 mm in greatest dimension with a small amount of associated pleural thickening/pleural effusion. This was not well seen on the prior CT from 2011. This is best visualized on image number 114 of series 2. Upper Abdomen: Visualized upper abdomen is unremarkable. Gallbladder has been surgically removed. Musculoskeletal: Degenerative changes of the thoracic spine are noted. IMPRESSION: 17 mm irregular soft tissue density along the medial aspect of the left lung base as described. There is some associated pleural thickening/pleural effusion on the left which is small. This appears new from the prior exam and would correspond with the retrocardiac density seen previously. Consider one of the following in 3 months for both low-risk and high-risk individuals: (a) repeat chest CT, (b) follow-up PET-CT, or (c) tissue sampling. This recommendation follows the consensus statement: Guidelines for Management of Incidental Pulmonary Nodules Detected on CT Images: From the Fleischner Society 2017; Radiology 2017; 284:228-243. Right-sided pleural effusion consistent with that seen on recent chest x-ray. Mild associated  right lower lobe atelectasis is noted. Interval resolution of vascular congestion and interstitial edema. Aortic Atherosclerosis (ICD10-I70.0). Electronically Signed   By: Inez Catalina M.D.   On: 03/26/2019 14:32   Dg Chest Port 1 View  Result Date: 03/25/2019 CLINICAL DATA:  Shortness of breath, worsening for 2 weeks EXAM: PORTABLE CHEST 1 VIEW COMPARISON:  Radiograph 03/13/2019 FINDINGS: Stable cardiomegaly and a calcified, tortuous aorta. Increasing interstitial opacities  with septal lines and cephalized, indistinct pulmonary vascularity. Mild central vascular cuffing. There is patchy retrocardiac opacity in the left lung base. Small bilateral pleural effusions are noted. Biapical pleuroparenchymal scarring is seen. Degenerative changes are present in the imaged spine and shoulders. IMPRESSION: 1. Stable cardiomegaly with increasing interstitial pulmonary edema. 2. Small bilateral pleural effusions. 3. More focal opacity in the left lung base could reflect atelectasis or consolidation. 4.  Aortic Atherosclerosis (ICD10-I70.0). Electronically Signed   By: Lovena Le M.D.   On: 03/25/2019 17:36     Microbiology: Recent Results (from the past 240 hour(s))  SARS CORONAVIRUS 2 (Jerad Dunlap 6-24 HRS) Nasopharyngeal Nasopharyngeal Swab     Status: None   Collection Time: 03/25/19  4:40 PM   Specimen: Nasopharyngeal Swab  Result Value Ref Range Status   SARS Coronavirus 2 NEGATIVE NEGATIVE Final    Comment: (NOTE) SARS-CoV-2 target nucleic acids are NOT DETECTED. The SARS-CoV-2 RNA is generally detectable in upper and lower respiratory specimens during the acute phase of infection. Negative results do not preclude SARS-CoV-2 infection, do not rule out co-infections with other pathogens, and should not be used as the sole basis for treatment or other patient management decisions. Negative results must be combined with clinical observations, patient history, and epidemiological information. The expected result is Negative. Fact Sheet for Patients: SugarRoll.be Fact Sheet for Healthcare Providers: https://www.woods-mathews.com/ This test is not yet approved or cleared by the [place] FDA and  has been authorized for detection and/or diagnosis of SARS-CoV-2 by FDA under an Emergency Use Authorization (EUA). This EUA will remain  in effect (meaning this test can be used) for the duration of the COVID-19 declaration under Section  56 4(b)(1) of the Act, 21 U.S.C. section 360bbb-3(b)(1), unless the authorization is terminated or revoked sooner. Performed at [facility], [place] [address], [place], [place] [address]      Labs: Basic Metabolic Panel: Recent Labs  Lab 03/25/19 1628 03/26/19 0531 03/27/19 1005  NA 141 140 140  K 3.7 3.5 3.2*  CL 113* 106 100  CO2 23 23 23   GLUCOSE 108* 146* 202*  BUN 16 16 32*  CREATININE 0.74 0.72 1.03*  CALCIUM 8.5* 9.0 9.6  MG  --   --  2.1   Liver Function Tests: Recent Labs  Lab 03/25/19 1628  AST 24  ALT 28  ALKPHOS 39  BILITOT 1.0  PROT 6.2*  ALBUMIN 3.9   No results for input(s): LIPASE, AMYLASE in the last 168 hours. No results for input(s): AMMONIA in the last 168 hours. CBC: Recent Labs  Lab 03/25/19 1628  WBC 34.0*  NEUTROABS 5.5  HGB 11.3*  HCT 36.4  MCV 96.6  PLT 213   Cardiac Enzymes: No results for input(s): CKTOTAL, CKMB, CKMBINDEX, TROPONINI in the last 168 hours. BNP: Invalid input(s): POCBNP CBG: Recent Labs  Lab 03/26/19 1649  GLUCAP 167*    Time coordinating discharge:  36 minutes  Signed:  Orson Eva, DO [facility] Pager: [phone] 03/27/2019, 2:37 PM

## 2019-03-27 NOTE — Progress Notes (Signed)
Progress Note  Patient Name: [name] Date of Encounter: 03/27/2019  Primary Cardiologist: Kate Sable, MD  Subjective   Feels better this morning, has already walked in the hall.  Would like to go home today.  No chest pain or palpitations.  Inpatient Medications    Scheduled Meds:  apixaban  5 mg Oral BID   bisoprolol  2.5 mg Oral Daily   furosemide  40 mg Intravenous Q12H   ipratropium-albuterol  3 mL Nebulization TID   methylPREDNISolone (SOLU-MEDROL) injection  40 mg Intravenous Q6H    PRN Meds: acetaminophen **OR** acetaminophen   Vital Signs    Vitals:   03/26/19 1924 03/26/19 2230 03/27/19 0512 03/27/19 0740  BP:  99/64 93/62   Pulse:  72 66   Resp:  20 18   Temp:  98.4 F (36.9 C) 98.2 F (36.8 C)   TempSrc:  Oral Oral   SpO2: 93% 97% 97% 96%  Weight:      Height:        Intake/Output Summary (Last 24 hours) at 03/27/2019 0940 Last data filed at 03/27/2019 0500 Gross per 24 hour  Intake 400 ml  Output 1200 ml  Net -800 ml   Filed Weights   03/25/19 1624  Weight: 76 kg    Telemetry    Sinus rhythm.  Personally reviewed.  ECG    An ECG dated 03/25/2019 was personally reviewed today and demonstrated:  Sinus rhythm with a bundle-branch block.  Physical Exam   GEN:  Elderly woman.  No acute distress.   Neck: No JVD. Cardiac: RRR, no gallop.  Respiratory: Nonlabored.  Decreased breath sounds without wheezing. GI: Soft, nontender, bowel sounds present. MS: No edema; No deformity. Neuro:  Nonfocal. Psych: Alert and oriented x 3. Normal affect.  Labs    Chemistry Recent Labs  Lab 03/25/19 1628 03/26/19 0531  NA 141 140  K 3.7 3.5  CL 113* 106  CO2 23 23  GLUCOSE 108* 146*  BUN 16 16  CREATININE 0.74 0.72  CALCIUM 8.5* 9.0  PROT 6.2*  --   ALBUMIN 3.9  --   AST 24  --   ALT 28  --   ALKPHOS 39  --   BILITOT 1.0  --   GFRNONAA >60 >60  GFRAA >60 >60  ANIONGAP 5 11     Hematology Recent Labs  Lab  03/25/19 1628  WBC 34.0*  RBC 3.77*  HGB 11.3*  HCT 36.4  MCV 96.6  MCH 30.0  MCHC 31.0  RDW 14.6  PLT 213    Cardiac Enzymes Recent Labs  Lab 03/25/19 1628 03/25/19 1859  TROPONINIHS 7 8    BNP Recent Labs  Lab 03/25/19 1628  BNP 1,067.0*     Radiology    Ct Chest Wo Contrast  Result Date: 03/26/2019 CLINICAL DATA:  Increasing shortness of breath, possible left retrocardiac infiltrate EXAM: CT CHEST WITHOUT CONTRAST TECHNIQUE: Multidetector CT imaging of the chest was performed following the standard protocol without IV contrast. COMPARISON:  Chest x-ray from the previous day, CT from 02/05/2010. FINDINGS: Cardiovascular: Thoracic aorta demonstrates atherosclerotic calcifications. No aneurysmal dilatation is seen. No cardiac enlargement is noted. Heavy coronary calcifications are seen. Lack of IV contrast somewhat limits evaluation of the vascular structures. Mediastinum/Nodes: Thoracic inlet again demonstrates a prominent inferior lobe of the left thyroid but it is stable from the prior exam. Given its long-term stability it is felt to be benign in etiology. Scattered small mediastinal lymph nodes  are seen. These are not significant by size criteria. The esophagus is within normal limits. Lungs/Pleura: Small right pleural effusion is noted. The degree of vascular congestion and edema seen on the previous day has resolved. No sizable parenchymal nodules are seen. Mild right basilar atelectasis is noted. There is some irregular density seen in the medial aspect of the left lung base. This measures approximately 17 mm in greatest dimension with a small amount of associated pleural thickening/pleural effusion. This was not well seen on the prior CT from 2011. This is best visualized on image number 114 of series 2. Upper Abdomen: Visualized upper abdomen is unremarkable. Gallbladder has been surgically removed. Musculoskeletal: Degenerative changes of the thoracic spine are noted.  IMPRESSION: 17 mm irregular soft tissue density along the medial aspect of the left lung base as described. There is some associated pleural thickening/pleural effusion on the left which is small. This appears new from the prior exam and would correspond with the retrocardiac density seen previously. Consider one of the following in 3 months for both low-risk and high-risk individuals: (a) repeat chest CT, (b) follow-up PET-CT, or (c) tissue sampling. This recommendation follows the consensus statement: Guidelines for Management of Incidental Pulmonary Nodules Detected on CT Images: From the Fleischner Society 2017; Radiology 2017; 284:228-243. Right-sided pleural effusion consistent with that seen on recent chest x-ray. Mild associated right lower lobe atelectasis is noted. Interval resolution of vascular congestion and interstitial edema. Aortic Atherosclerosis (ICD10-I70.0). Electronically Signed   By: Inez Catalina M.D.   On: 03/26/2019 14:32   Dg Chest Port 1 View  Result Date: 03/25/2019 CLINICAL DATA:  Shortness of breath, worsening for 2 weeks EXAM: PORTABLE CHEST 1 VIEW COMPARISON:  Radiograph 03/13/2019 FINDINGS: Stable cardiomegaly and a calcified, tortuous aorta. Increasing interstitial opacities with septal lines and cephalized, indistinct pulmonary vascularity. Mild central vascular cuffing. There is patchy retrocardiac opacity in the left lung base. Small bilateral pleural effusions are noted. Biapical pleuroparenchymal scarring is seen. Degenerative changes are present in the imaged spine and shoulders. IMPRESSION: 1. Stable cardiomegaly with increasing interstitial pulmonary edema. 2. Small bilateral pleural effusions. 3. More focal opacity in the left lung base could reflect atelectasis or consolidation. 4.  Aortic Atherosclerosis (ICD10-I70.0). Electronically Signed   By: Lovena Le M.D.   On: 03/25/2019 17:36    Cardiac Studies   Echocardiogram 03/26/2019:  1. Left ventricular ejection  fraction, by visual estimation, is 40 to 45%. The left ventricle has mild to moderately decreased function. There is moderately increased left ventricular hypertrophy.  2. Abnormal septal motion consistent with left bundle branch block.  3. Left ventricular diastolic parameters are consistent with Grade II diastolic dysfunction (pseudonormalization).  4. Global right ventricle has normal systolic function.The right ventricular size is normal. No increase in right ventricular wall thickness.  5. Left atrial size was severely dilated.  6. Right atrial size was normal.  7. Presence of pericardial fat pad.  8. Mild to moderate aortic valve annular calcification.  9. Moderate mitral annular calcification. 10. The mitral valve is grossly normal. Mild mitral valve regurgitation. 11. The tricuspid valve is grossly normal. Tricuspid valve regurgitation is mild. 12. Aortic valve mean gradient measures 11.0 mmHg. 13. The aortic valve is tricuspid. Aortic valve regurgitation is not visualized. Mild aortic valve stenosis. 14. The pulmonic valve was grossly normal. Pulmonic valve regurgitation is trivial. 15. Mildly elevated pulmonary artery systolic pressure. 16. The tricuspid regurgitant velocity is 3.04 m/s, and with an assumed right atrial pressure of  3 mmHg, the estimated right ventricular systolic pressure is mildly elevated at 40.0 mmHg. 17. The inferior vena cava is normal in size with greater than 50% respiratory variability, suggesting right atrial pressure of 3 mmHg.  Patient Profile     83 y.o. female with a history of paroxysmal atrial fibrillation, CLL, COPD, now presenting with acute systolic heart failure and bradycardia.  Assessment & Plan    1.  Paroxysmal atrial fibrillation.  She is maintaining sinus rhythm at this time and is on Eliquis.  She was bradycardic at presentation and Toprol-XL was discontinued.  Since then she has been started on low-dose bisoprolol in light of newly  documented cardiomyopathy.  2.  Secondary cardiomyopathy, LVEF 40 to 45% by follow-up echocardiogram with moderate diastolic dysfunction.  Volume status improved with IV Lasix and patient feels back to baseline.  She diuresed approximately 2000 cc.  3.  Left bundle branch block.  4.  Chest CT showing soft tissue density along the medial aspect of the left lung base with associated pleural thickening and effusion of small size.  Follow-up and further imaging suggested per radiology report.  Would make sure that this is communicated to the patient's PCP and oncology to arrange appropriate management.  Discussed with patient.  Stable for discharge home from a cardiac perspective.  Continue bisoprolol and Eliquis, also starting her on low-dose Aldactone.  We will arrange a follow-up visit with BMET in the next few weeks.  Signed, Rozann Lesches, MD  03/27/2019, 9:40 AM

## 2019-04-01 ENCOUNTER — Other Ambulatory Visit: Payer: Self-pay

## 2019-04-01 ENCOUNTER — Ambulatory Visit (HOSPITAL_COMMUNITY)
Admission: RE | Admit: 2019-04-01 | Discharge: 2019-04-01 | Disposition: A | Discharge status: Home / Self Care | Payer: Medicare HMO | Source: Ambulatory Visit | Attending: Internal Medicine | Admitting: Internal Medicine

## 2019-04-01 DIAGNOSIS — Z1231 Encounter for screening mammogram for malignant neoplasm of breast: Secondary | ICD-10-CM | POA: Insufficient documentation

## 2019-04-02 ENCOUNTER — Other Ambulatory Visit (HOSPITAL_COMMUNITY): Payer: Medicare HMO

## 2019-04-03 ENCOUNTER — Other Ambulatory Visit: Payer: Self-pay

## 2019-04-03 ENCOUNTER — Inpatient Hospital Stay (HOSPITAL_COMMUNITY): Payer: Medicare HMO | Attending: Hematology

## 2019-04-03 ENCOUNTER — Other Ambulatory Visit (HOSPITAL_COMMUNITY)
Admission: RE | Admit: 2019-04-03 | Discharge: 2019-04-03 | Disposition: A | Discharge status: Home / Self Care | Payer: Medicare HMO | Source: Ambulatory Visit | Attending: Cardiovascular Disease | Admitting: Cardiovascular Disease

## 2019-04-03 DIAGNOSIS — Z7901 Long term (current) use of anticoagulants: Secondary | ICD-10-CM | POA: Diagnosis not present

## 2019-04-03 DIAGNOSIS — Z87891 Personal history of nicotine dependence: Secondary | ICD-10-CM | POA: Insufficient documentation

## 2019-04-03 DIAGNOSIS — Z809 Family history of malignant neoplasm, unspecified: Secondary | ICD-10-CM | POA: Diagnosis not present

## 2019-04-03 DIAGNOSIS — I480 Paroxysmal atrial fibrillation: Secondary | ICD-10-CM | POA: Insufficient documentation

## 2019-04-03 DIAGNOSIS — Z853 Personal history of malignant neoplasm of breast: Secondary | ICD-10-CM | POA: Insufficient documentation

## 2019-04-03 DIAGNOSIS — Z9221 Personal history of antineoplastic chemotherapy: Secondary | ICD-10-CM | POA: Diagnosis not present

## 2019-04-03 DIAGNOSIS — Z8701 Personal history of pneumonia (recurrent): Secondary | ICD-10-CM | POA: Insufficient documentation

## 2019-04-03 DIAGNOSIS — C9110 Chronic lymphocytic leukemia of B-cell type not having achieved remission: Secondary | ICD-10-CM | POA: Insufficient documentation

## 2019-04-03 DIAGNOSIS — Z9011 Acquired absence of right breast and nipple: Secondary | ICD-10-CM | POA: Diagnosis not present

## 2019-04-03 DIAGNOSIS — Z90710 Acquired absence of both cervix and uterus: Secondary | ICD-10-CM | POA: Insufficient documentation

## 2019-04-03 DIAGNOSIS — Z923 Personal history of irradiation: Secondary | ICD-10-CM | POA: Diagnosis not present

## 2019-04-03 DIAGNOSIS — C50911 Malignant neoplasm of unspecified site of right female breast: Secondary | ICD-10-CM

## 2019-04-03 DIAGNOSIS — E785 Hyperlipidemia, unspecified: Secondary | ICD-10-CM | POA: Insufficient documentation

## 2019-04-03 DIAGNOSIS — I4891 Unspecified atrial fibrillation: Secondary | ICD-10-CM | POA: Insufficient documentation

## 2019-04-03 LAB — CBC WITH DIFFERENTIAL/PLATELET
Abs Immature Granulocytes: 0.05 10*3/uL (ref 0.00–0.07)
Basophils Absolute: 0.1 10*3/uL (ref 0.0–0.1)
Basophils Relative: 0 %
Eosinophils Absolute: 0.2 10*3/uL (ref 0.0–0.5)
Eosinophils Relative: 0 %
HCT: 38.6 % (ref 36.0–46.0)
Hemoglobin: 11.8 g/dL — ABNORMAL LOW (ref 12.0–15.0)
Immature Granulocytes: 0 %
Lymphocytes Relative: 88 %
Lymphs Abs: 34.3 10*3/uL — ABNORMAL HIGH (ref 0.7–4.0)
MCH: 29.6 pg (ref 26.0–34.0)
MCHC: 30.6 g/dL (ref 30.0–36.0)
MCV: 96.7 fL (ref 80.0–100.0)
Monocytes Absolute: 0.9 10*3/uL (ref 0.1–1.0)
Monocytes Relative: 2 %
Neutro Abs: 3.9 10*3/uL (ref 1.7–7.7)
Neutrophils Relative %: 10 %
Platelets: 197 10*3/uL (ref 150–400)
RBC: 3.99 MIL/uL (ref 3.87–5.11)
RDW: 14.4 % (ref 11.5–15.5)
WBC: 39.5 10*3/uL — ABNORMAL HIGH (ref 4.0–10.5)
nRBC: 0 % (ref 0.0–0.2)

## 2019-04-03 LAB — COMPREHENSIVE METABOLIC PANEL
ALT: 15 U/L (ref 0–44)
AST: 20 U/L (ref 15–41)
Albumin: 4 g/dL (ref 3.5–5.0)
Alkaline Phosphatase: 39 U/L (ref 38–126)
Anion gap: 10 (ref 5–15)
BUN: 18 mg/dL (ref 8–23)
CO2: 24 mmol/L (ref 22–32)
Calcium: 8.9 mg/dL (ref 8.9–10.3)
Chloride: 106 mmol/L (ref 98–111)
Creatinine, Ser: 0.75 mg/dL (ref 0.44–1.00)
GFR calc Af Amer: >60 mL/min (ref >60)
GFR calc non Af Amer: >60 mL/min (ref >60)
Glucose, Bld: 108 mg/dL — ABNORMAL HIGH (ref 70–99)
Potassium: 4.5 mmol/L (ref 3.5–5.1)
Sodium: 140 mmol/L (ref 135–145)
Total Bilirubin: 0.8 mg/dL (ref 0.3–1.2)
Total Protein: 6.1 g/dL — ABNORMAL LOW (ref 6.5–8.1)

## 2019-04-03 LAB — BASIC METABOLIC PANEL
Anion gap: 10 (ref 5–15)
BUN: 18 mg/dL (ref 8–23)
CO2: 25 mmol/L (ref 22–32)
Calcium: 9 mg/dL (ref 8.9–10.3)
Chloride: 106 mmol/L (ref 98–111)
Creatinine, Ser: 0.73 mg/dL (ref 0.44–1.00)
GFR calc Af Amer: >60 mL/min (ref >60)
GFR calc non Af Amer: >60 mL/min (ref >60)
Glucose, Bld: 104 mg/dL — ABNORMAL HIGH (ref 70–99)
Potassium: 4.3 mmol/L (ref 3.5–5.1)
Sodium: 141 mmol/L (ref 135–145)

## 2019-04-03 LAB — LIPID PANEL
Cholesterol: 147 mg/dL (ref 0–200)
HDL: 35 mg/dL — ABNORMAL LOW (ref >40)
LDL Cholesterol: 86 mg/dL (ref 0–99)
Total CHOL/HDL Ratio: 4.2 RATIO
Triglycerides: 131 mg/dL (ref <150)
VLDL: 26 mg/dL (ref 0–40)

## 2019-04-03 LAB — CBC
HCT: 38.8 % (ref 36.0–46.0)
Hemoglobin: 12 g/dL (ref 12.0–15.0)
MCH: 30 pg (ref 26.0–34.0)
MCHC: 30.9 g/dL (ref 30.0–36.0)
MCV: 97 fL (ref 80.0–100.0)
Platelets: 193 10*3/uL (ref 150–400)
RBC: 4 MIL/uL (ref 3.87–5.11)
RDW: 14.3 % (ref 11.5–15.5)
WBC: 39.8 10*3/uL — ABNORMAL HIGH (ref 4.0–10.5)
nRBC: 0 % (ref 0.0–0.2)

## 2019-04-03 LAB — LACTATE DEHYDROGENASE: LDH: 146 U/L (ref 98–192)

## 2019-04-04 ENCOUNTER — Encounter: Payer: Self-pay | Admitting: Student

## 2019-04-04 ENCOUNTER — Other Ambulatory Visit: Payer: Self-pay | Admitting: Pulmonary Disease

## 2019-04-04 ENCOUNTER — Telehealth (INDEPENDENT_AMBULATORY_CARE_PROVIDER_SITE_OTHER): Payer: Medicare HMO | Admitting: Student

## 2019-04-04 VITALS — BP 117/74 | Ht 61.0 in | Wt 167.0 lb

## 2019-04-04 DIAGNOSIS — C9110 Chronic lymphocytic leukemia of B-cell type not having achieved remission: Secondary | ICD-10-CM

## 2019-04-04 DIAGNOSIS — I5022 Chronic systolic (congestive) heart failure: Secondary | ICD-10-CM

## 2019-04-04 DIAGNOSIS — I480 Paroxysmal atrial fibrillation: Secondary | ICD-10-CM

## 2019-04-04 DIAGNOSIS — R9389 Abnormal findings on diagnostic imaging of other specified body structures: Secondary | ICD-10-CM

## 2019-04-04 DIAGNOSIS — J90 Pleural effusion, not elsewhere classified: Secondary | ICD-10-CM

## 2019-04-04 MED ORDER — SPIRONOLACTONE 25 MG PO TABS: 12.5000 mg | ORAL_TABLET | Freq: Every day | ORAL | 3 refills | Status: DC

## 2019-04-04 MED ORDER — BISOPROLOL FUMARATE 5 MG PO TABS: 5.0000 mg | ORAL_TABLET | Freq: Every day | ORAL | 3 refills | Status: DC

## 2019-04-04 NOTE — Progress Notes (Signed)
Virtual Visit via Telephone Note   This visit type was conducted due to national recommendations for restrictions regarding the COVID-19 Pandemic (e.g. social distancing) in an effort to limit this patient's exposure and mitigate transmission in our community.  Due to her co-morbid illnesses, this patient is at least at moderate risk for complications without adequate follow up.  This format is felt to be most appropriate for this patient at this time.  The patient did not have access to video technology/had technical difficulties with video requiring transitioning to audio format only (telephone).  All issues noted in this document were discussed and addressed.  No physical exam could be performed with this format.  Please refer to the patient's chart for her  consent to telehealth for [facility].   Date:  04/04/2019   ID:  Westley Gambles, DOB 26-Jan-1936, MRN [id number]  Patient Location: Home Provider Location: Office  PCP:  Lemmie Evens, MD  Cardiologist:  Kate Sable, MD  Electrophysiologist:  None   Evaluation Performed:  Follow-Up Visit  Chief Complaint: Hospital Follow-up  History of Present Illness:    [name] is a 83 y.o. female with past medical history of paroxysmal atrial fibrillation, mild AS, secondary cardiomyopathy (EF previously 55-60% in 06/2018, reduced to 40-45% by repeat echo in 03/2019), LBBB, CLL, and COPD who presents for a hospital follow-up telehealth visit.  She was last examined by Dr. Bronson Ing in 02/2019 and reported having stopped Toprol-XL and Eliquis due to not having refills. She was restarted on Lopressor 12.5 mg twice daily given her soft BP. She presented to [facility] ED on 03/25/2019 for evaluation of worsening dyspnea on exertion over the past 3 weeks. BNP was elevated to 1067 but troponin values were negative. CXR did show interstitial edema and small bilateral effusions. She was admitted for a CHF exacerbation and responded  well to IV Lasix. Echocardiogram did show her EF was reduced to 40 to 45% and she was started on Bisoprolol with Spironolactone 12.5mg  daily added to her medication regimen. BP did not allow for ACE-or ARB. Weight was 167 lbs at discharge and she was not placed on a standing diuretic. During admission, her Chest CT did show a 17 mm irregular soft tissue density along the medial aspect of the left lung base with follow-up imaging recommended.   In talking with the patient today, she reports overall doing well since her recent admission.  She feels that her breathing has returned to baseline and denies any recent orthopnea, PND or lower extremity edema. No recent chest pain or palpitations.  She reports good compliance with her current medication regimen and denies missing any recent doses. BP has been well controlled when checked at home, at 117/74 on most recent check.  The patient does not have symptoms concerning for COVID-19 infection (fever, chills, cough, or new shortness of breath).    Past Medical History:  Diagnosis Date  . Bowel obstruction ([place])   . Breast cancer ([place])   . Breast cancer, stage 3 ([place]) 07/21/2011  . Bronchitis   . CLL (chronic lymphocytic leukemia) ([place]) 07/21/2011  . Migraines   . PAF (paroxysmal atrial fibrillation) ([place])   . Pneumonia    Past Surgical History:  Procedure Laterality Date  . ABDOMINAL HYSTERECTOMY    . APPENDECTOMY    . CHOLECYSTECTOMY    . LAPAROTOMY N/A 06/08/2015   Procedure: EXPLORATORY LAPAROTOMY;  Surgeon: Aviva Signs, MD;  Location: [facility];  Service: General;  Laterality: N/A;  .  LYSIS OF ADHESION N/A 06/08/2015   Procedure: LYSIS OF ADHESION;  Surgeon: Aviva Signs, MD;  Location: [facility] ORS;  Service: General;  Laterality: N/A;  . MASTECTOMY     right  . TOTAL HIP ARTHROPLASTY     right     Current Meds  Medication Sig  . acetaminophen (TYLENOL) 500 MG tablet Take 500 mg by mouth every 6 (six) hours as needed for mild pain, moderate  pain or headache.  . albuterol (PROVENTIL HFA;VENTOLIN HFA) 108 (90 BASE) MCG/ACT inhaler Inhale 2 puffs into the lungs every 6 (six) hours as needed for shortness of breath.   Marland Kitchen albuterol (PROVENTIL) (2.5 MG/3ML) 0.083% nebulizer solution Take 3 mLs (2.5 mg total) by nebulization every 6 (six) hours as needed for wheezing or shortness of breath (when not relieved by inhaler.).  Marland Kitchen apixaban (ELIQUIS) 5 MG TABS tablet Take 1 tablet (5 mg total) by mouth 2 (two) times daily.  . bisoprolol (ZEBETA) 5 MG tablet Take 1 tablet (5 mg total) by mouth daily.  . cholecalciferol (VITAMIN D) 1000 UNITS tablet Take 1,000 Units by mouth daily.  Marland Kitchen omeprazole (PRILOSEC) 20 MG capsule Take 20 mg by mouth at bedtime.   Marland Kitchen spironolactone (ALDACTONE) 25 MG tablet Take 0.5 tablets (12.5 mg total) by mouth daily.  . [DISCONTINUED] bisoprolol (ZEBETA) 5 MG tablet Take 0.5 tablets (2.5 mg total) by mouth daily.  . [DISCONTINUED] spironolactone (ALDACTONE) 25 MG tablet Take 0.5 tablets (12.5 mg total) by mouth daily.     Allergies:   Codeine, Meclizine, and Sulfa antibiotics   Social History   Tobacco Use  . Smoking status: Former Smoker    Quit date: 10/26/1978    Years since quitting: 40.4  . Smokeless tobacco: Never Used  Substance Use Topics  . Alcohol use: No  . Drug use: No     Family Hx: The patient's family history includes Cancer in her brother and another family member.  ROS:   Please see the history of present illness.     All other systems reviewed and are negative.   Prior CV studies:   The following studies were reviewed today:  Echocardiogram: 03/26/2019 IMPRESSIONS    1. Left ventricular ejection fraction, by visual estimation, is 40 to 45%. The left ventricle has mild to moderately decreased function. There is moderately increased left ventricular hypertrophy.  2. Abnormal septal motion consistent with left bundle branch block.  3. Left ventricular diastolic parameters are consistent  with Grade II diastolic dysfunction (pseudonormalization).  4. Global right ventricle has normal systolic function.The right ventricular size is normal. No increase in right ventricular wall thickness.  5. Left atrial size was severely dilated.  6. Right atrial size was normal.  7. Presence of pericardial fat pad.  8. Mild to moderate aortic valve annular calcification.  9. Moderate mitral annular calcification. 10. The mitral valve is grossly normal. Mild mitral valve regurgitation. 11. The tricuspid valve is grossly normal. Tricuspid valve regurgitation is mild. 12. Aortic valve mean gradient measures 11.0 mmHg. 13. The aortic valve is tricuspid. Aortic valve regurgitation is not visualized. Mild aortic valve stenosis. 14. The pulmonic valve was grossly normal. Pulmonic valve regurgitation is trivial. 15. Mildly elevated pulmonary artery systolic pressure. 16. The tricuspid regurgitant velocity is 3.04 m/s, and with an assumed right atrial pressure of 3 mmHg, the estimated right ventricular systolic pressure is mildly elevated at 40.0 mmHg. 17. The inferior vena cava is normal in size with greater than 50% respiratory variability, suggesting  right atrial pressure of 3 mmHg.  Labs/Other Tests and Data Reviewed:    EKG:  An ECG dated 03/25/2019 was personally reviewed today and demonstrated:  NSR, HR 60 with PVC's and known LBBB .   Recent Labs: 03/25/2019: B Natriuretic Peptide 1,067.0; TSH 2.477 03/27/2019: Magnesium 2.1 04/03/2019: ALT 15; BUN 18; Creatinine, Ser 0.73; Hemoglobin 12.0; Platelets 193; Potassium 4.3; Sodium 141   Recent Lipid Panel Lab Results  Component Value Date/Time   CHOL 147 04/03/2019 12:41 PM   TRIG 131 04/03/2019 12:41 PM   HDL 35 (L) 04/03/2019 12:41 PM   CHOLHDL 4.2 04/03/2019 12:41 PM   LDLCALC 86 04/03/2019 12:41 PM    Wt Readings from Last 3 Encounters:  04/04/19 167 lb (75.8 kg)  03/25/19 167 lb 8.8 oz (76 kg)  03/08/19 169 lb (76.7 kg)      Objective:    Vital Signs:  BP 117/74   Ht 5\' 1"  (1.549 m)   Wt 167 lb (75.8 kg)   BMI 31.55 kg/m    General: Pleasant female sounding in NAD Psych: Normal affect. Neuro: Alert and oriented X 3. Lungs:  Resp regular and unlabored while talking on the phone.   ASSESSMENT & PLAN:    1. Chronic Systolic CHF/ Secondary Cardiomyopathy - recent echo showed her EF was reduced to 40-45% in the setting of being without her medications for several months.  - she denies any orthopnea, PND or edema. Weight has been stable on her home scales.  - currently on Bisoprolol 2.5mg  daily and Spironolactone 12.5mg  daily. BMET on 04/03/2019 showed stable renal function and electrolytes following initiation of Spironolactone. Will plan to titrate Bisoprolol to 5mg  daily at this time. I encouraged her to follow HR and BP at home. Pending trend, would further titrate Spironolactone or add low-dose ARB if BP allows. Plan for repeat echo in 3-4 months following optimization of her medication regimen.   2. Paroxysmal Atrial Fibrillation - she denies any recent palpitations. Continue Bisoprolol for rate-control. Remains on Eliquis 5mg  BID for anticoagulation.   3. CLL/ Abnormal CT - followed by Oncology. Recent Chest CT did show a 17 mm irregular soft tissue density along the medial aspect of the left lung base with follow-up imaging recommended. She has follow-up with Oncology scheduled for later this month and repeat imaging scheduled in 04/2019.    COVID-19 Education: The signs and symptoms of COVID-19 were discussed with the patient and how to seek care for testing (follow up with PCP or arrange E-visit).  The importance of social distancing was discussed today.  Time:   Today, I have spent 18 minutes with the patient with telehealth technology discussing the above problems.     Medication Adjustments/Labs and Tests Ordered: Current medicines are reviewed at length with the patient today.  Concerns  regarding medicines are outlined above.   Tests Ordered: No orders of the defined types were placed in this encounter.   Medication Changes: Meds ordered this encounter  Medications  . bisoprolol (ZEBETA) 5 MG tablet    Sig: Take 1 tablet (5 mg total) by mouth daily.    Dispense:  90 tablet    Refill:  3    Order Specific Question:   Supervising Provider    Answer:   Dorothy Spark [id number]  . spironolactone (ALDACTONE) 25 MG tablet    Sig: Take 0.5 tablets (12.5 mg total) by mouth daily.    Dispense:  45 tablet    Refill:  3  Order Specific Question:   Supervising Provider    Answer:   Dorothy Spark [id number]    Follow Up:  In 2-3 months  Signed, Erma Heritage, PA-C  04/04/2019 5:30 PM    [place]

## 2019-04-04 NOTE — Telephone Encounter (Signed)
New message    Patient wants to know if she should be taking the 2nd half of the pink pill this evening, she took half this morning like you recommend but forgot to ask in her Virtual Visit if you wanted her to take the 2nd half

## 2019-04-04 NOTE — Patient Instructions (Signed)
Medication Instructions:  Your physician has recommended you make the following change in your medication: Increase Bisoprolol to 5 mg Daily    Labwork: NONE   Testing/Procedures: NONE   Follow-Up: Your physician recommends that you schedule a follow-up appointment in: 2-3 Months    Any Other Special Instructions Will Be Listed Below (If Applicable).     If you need a refill on your cardiac medications before your next appointment, please call your pharmacy.

## 2019-04-04 NOTE — Telephone Encounter (Signed)
We told patient to start tomorrow with the whole tablet of bisoprolol

## 2019-04-09 ENCOUNTER — Other Ambulatory Visit: Payer: Self-pay

## 2019-04-10 ENCOUNTER — Encounter (HOSPITAL_COMMUNITY): Payer: Self-pay | Admitting: Hematology

## 2019-04-10 ENCOUNTER — Inpatient Hospital Stay (HOSPITAL_COMMUNITY): Payer: Medicare HMO | Admitting: Hematology

## 2019-04-10 DIAGNOSIS — C9110 Chronic lymphocytic leukemia of B-cell type not having achieved remission: Secondary | ICD-10-CM | POA: Diagnosis not present

## 2019-04-10 NOTE — Assessment & Plan Note (Signed)
1.  CLL. -  Pt was Diagnosed 25+ years ago. Has never required treatment.  - Pt reports ongoing hot flashes.  She denies adenopathy.   -Labs are relatively stable white blood cell count 39.5, lymphocytes 34.3, hemoglobin 11.8, and platelets 197,000.  No B symptoms. -Of note, while patient was admitted to the hospital for pneumonia, CT of chest was performed and revealed a 17 mm irregular soft tissue density along the medial aspect of the left lung base.  Patient does have a follow-up CT scheduled with her primary care for provider. -We will see her back in 6 months.   2.  Stage III right breast cancer.   - Pt was diagnosed in 1998/1999. Treated with neoadjuvant chemo, followed by (R) mastectomy, then XRT.   - Pt had left screening mammogram done 03/26/2018 that was negative.   -Repeat mammogram on April 01, 2019 was negative for any evidence of malignancy.

## 2019-04-10 NOTE — Progress Notes (Signed)
[address], [place] [address]   CLINIC:  Medical Oncology/Hematology  PCP:  Lemmie Evens, MD [place] [id number] [phone]   REASON FOR VISIT:  Follow-up for CLL   CURRENT THERAPY: Clinical surveillance   INTERVAL HISTORY:  [name] 83 y.o. female presents today for follow-up of CLL.  She reports overall doing well.  She was recently hospitalized for pneumonia.  She states that she was Covid negative.  She denies any fevers, chills, night sweats.  No weight loss.  No lymphadenopathy.  Overall she states she is doing well.  REVIEW OF SYSTEMS:  Review of Systems  Constitutional: Negative.   HENT:  Negative.   Eyes: Negative.   Respiratory: Negative.   Cardiovascular: Negative.   Gastrointestinal: Negative.   Genitourinary: Negative.    Musculoskeletal: Positive for arthralgias, back pain, gait problem and myalgias.  Skin: Negative.   Neurological: Positive for extremity weakness and gait problem.  Hematological: Negative.   Psychiatric/Behavioral: Negative.      PAST MEDICAL/SURGICAL HISTORY:  Past Medical History:  Diagnosis Date  . Bowel obstruction ([place])   . Breast cancer ([place])   . Breast cancer, stage 3 ([place]) 07/21/2011  . Bronchitis   . CLL (chronic lymphocytic leukemia) ([place]) 07/21/2011  . Migraines   . PAF (paroxysmal atrial fibrillation) ([place])   . Pneumonia    Past Surgical History:  Procedure Laterality Date  . ABDOMINAL HYSTERECTOMY    . APPENDECTOMY    . CHOLECYSTECTOMY    . LAPAROTOMY N/A 06/08/2015   Procedure: EXPLORATORY LAPAROTOMY;  Surgeon: Aviva Signs, MD;  Location: [facility];  Service: General;  Laterality: N/A;  . LYSIS OF ADHESION N/A 06/08/2015   Procedure: LYSIS OF ADHESION;  Surgeon: Aviva Signs, MD;  Location: [facility];  Service: General;  Laterality: N/A;  . MASTECTOMY     right  . TOTAL HIP ARTHROPLASTY     right     SOCIAL HISTORY:  Social History    Socioeconomic History  . Marital status: Widowed    Spouse name: Not on file  . Number of children: Not on file  . Years of education: 53  . Highest education level: Not on file  Occupational History  . Not on file  Social Needs  . Financial resource strain: Patient refused  . Food insecurity    Worry: Patient refused    Inability: Patient refused  . Transportation needs    Medical: Patient refused    Non-medical: Patient refused  Tobacco Use  . Smoking status: Former Smoker    Quit date: 10/26/1978    Years since quitting: 40.4  . Smokeless tobacco: Never Used  Substance and Sexual Activity  . Alcohol use: No  . Drug use: No  . Sexual activity: Not Currently  Lifestyle  . Physical activity    Days per week: Patient refused    Minutes per session: Patient refused  . Stress: Rather much  Relationships  . Social Herbalist on phone: Patient refused    Gets together: Patient refused    Attends religious service: Patient refused    Active member of club or organization: Patient refused    Attends meetings of clubs or organizations: Patient refused    Relationship status: Patient refused  . Intimate partner violence    Fear of current or ex partner: Patient refused    Emotionally abused: Patient refused    Physically abused: Patient refused  Forced sexual activity: Patient refused  Other Topics Concern  . Not on file  Social History Narrative   Pt lives by herself    FAMILY HISTORY:  Family History  Problem Relation Age of Onset  . Cancer Brother   . Cancer Other     CURRENT MEDICATIONS:  Outpatient Encounter Medications as of 04/10/2019  Medication Sig  . apixaban (ELIQUIS) 5 MG TABS tablet Take 1 tablet (5 mg total) by mouth 2 (two) times daily.  . bisoprolol (ZEBETA) 5 MG tablet Take 1 tablet (5 mg total) by mouth daily.  . cholecalciferol (VITAMIN D) 1000 UNITS tablet Take 1,000 Units by mouth daily.  Marland Kitchen omeprazole (PRILOSEC) 20 MG capsule Take  20 mg by mouth at bedtime.   Marland Kitchen spironolactone (ALDACTONE) 25 MG tablet Take 0.5 tablets (12.5 mg total) by mouth daily.  Marland Kitchen acetaminophen (TYLENOL) 500 MG tablet Take 500 mg by mouth every 6 (six) hours as needed for mild pain, moderate pain or headache.  . albuterol (PROVENTIL HFA;VENTOLIN HFA) 108 (90 BASE) MCG/ACT inhaler Inhale 2 puffs into the lungs every 6 (six) hours as needed for shortness of breath.   Marland Kitchen albuterol (PROVENTIL) (2.5 MG/3ML) 0.083% nebulizer solution Take 3 mLs (2.5 mg total) by nebulization every 6 (six) hours as needed for wheezing or shortness of breath (when not relieved by inhaler.). (Patient not taking: Reported on 04/10/2019)   No facility-administered encounter medications on file as of 04/10/2019.     ALLERGIES:  Allergies  Allergen Reactions  . Codeine Nausea And Vomiting    headache  . Meclizine Other (See Comments)    Made dizziness worse.  . Sulfa Antibiotics Nausea And Vomiting    Stomach upset     PHYSICAL EXAM:  ECOG Performance status: 1  Vitals:   04/10/19 1408  BP: (!) 103/57  Pulse: 74  Resp: 16  Temp: (!) 96.9 F (36.1 C)  SpO2: 98%   Filed Weights   04/10/19 1408  Weight: 159 lb 12.8 oz (72.5 kg)    Physical Exam Constitutional:      Appearance: Normal appearance. She is obese.  HENT:     Head: Normocephalic.     Right Ear: External ear normal.     Left Ear: External ear normal.     Nose: Nose normal.     Mouth/Throat:     Pharynx: Oropharynx is clear.  Eyes:     Conjunctiva/sclera: Conjunctivae normal.  Neck:     Musculoskeletal: Normal range of motion.  Cardiovascular:     Rate and Rhythm: Normal rate and regular rhythm.     Pulses: Normal pulses.     Heart sounds: Normal heart sounds.  Pulmonary:     Effort: Pulmonary effort is normal.     Breath sounds: Normal breath sounds.  Abdominal:     General: Bowel sounds are normal.  Musculoskeletal: Normal range of motion.     Comments: Decreased range of motion   Skin:    General: Skin is warm.  Neurological:     General: No focal deficit present.     Mental Status: She is alert and oriented to person, place, and time.  Psychiatric:        Mood and Affect: Mood normal.        Behavior: Behavior normal.        Thought Content: Thought content normal.        Judgment: Judgment normal.      LABORATORY DATA:  I have reviewed the  labs as listed.  CBC    Component Value Date/Time   WBC 39.8 (H) 04/03/2019 1241   RBC 4.00 04/03/2019 1241   HGB 12.0 04/03/2019 1241   HGB 13.5 10/12/2012 1038   HCT 38.8 04/03/2019 1241   HCT 42.7 10/12/2012 1038   PLT 193 04/03/2019 1241   PLT 241 10/12/2012 1038   MCV 97.0 04/03/2019 1241   MCV 88.9 10/12/2012 1038   MCH 30.0 04/03/2019 1241   MCHC 30.9 04/03/2019 1241   RDW 14.3 04/03/2019 1241   RDW 14.4 10/12/2012 1038   LYMPHSABS 34.3 (H) 04/03/2019 1207   LYMPHSABS 57.9 (H) 10/12/2012 1038   MONOABS 0.9 04/03/2019 1207   MONOABS 1.2 (H) 10/12/2012 1038   EOSABS 0.2 04/03/2019 1207   EOSABS 0.4 10/12/2012 1038   BASOSABS 0.1 04/03/2019 1207   BASOSABS 0.3 (H) 10/12/2012 1038   CMP Latest Ref Rng & Units 04/03/2019 04/03/2019 03/27/2019  Glucose 70 - 99 mg/dL 104(H) 108(H) 202(H)  BUN 8 - 23 mg/dL 18 18 32(H)  Creatinine 0.44 - 1.00 mg/dL 0.73 0.75 1.03(H)  Sodium 135 - 145 mmol/L 141 140 140  Potassium 3.5 - 5.1 mmol/L 4.3 4.5 3.2(L)  Chloride 98 - 111 mmol/L 106 106 100  CO2 22 - 32 mmol/L 25 24 23   Calcium 8.9 - 10.3 mg/dL 9.0 8.9 9.6  Total Protein 6.5 - 8.1 g/dL - 6.1(L) -  Total Bilirubin 0.3 - 1.2 mg/dL - 0.8 -  Alkaline Phos 38 - 126 U/L - 39 -  AST 15 - 41 U/L - 20 -  ALT 0 - 44 U/L - 15 -          ASSESSMENT & PLAN:   CLL (chronic lymphocytic leukemia) ([facility]) 1.  CLL. -  Pt was Diagnosed 25+ years ago. Has never required treatment.  - Pt reports ongoing hot flashes.  She denies adenopathy.   -Labs are relatively stable white blood cell count 39.5, lymphocytes 34.3,  hemoglobin 11.8, and platelets 197,000.  No B symptoms. -Of note, while patient was admitted to the hospital for pneumonia, CT of chest was performed and revealed a 17 mm irregular soft tissue density along the medial aspect of the left lung base.  Patient does have a follow-up CT scheduled with her primary care for provider. -We will see her back in 6 months.   2.  Stage III right breast cancer.   - Pt was diagnosed in 1998/1999. Treated with neoadjuvant chemo, followed by (R) mastectomy, then XRT.   - Pt had left screening mammogram done 03/26/2018 that was negative.   -Repeat mammogram on April 01, 2019 was negative for any evidence of malignancy.          [place] [phone]

## 2019-04-23 ENCOUNTER — Ambulatory Visit (HOSPITAL_COMMUNITY)
Admission: RE | Admit: 2019-04-23 | Discharge: 2019-04-23 | Disposition: A | Discharge status: Home / Self Care | Payer: Medicare HMO | Source: Ambulatory Visit | Attending: Pulmonary Disease | Admitting: Pulmonary Disease

## 2019-04-23 ENCOUNTER — Other Ambulatory Visit: Payer: Self-pay

## 2019-04-23 DIAGNOSIS — J90 Pleural effusion, not elsewhere classified: Secondary | ICD-10-CM | POA: Diagnosis not present

## 2019-04-23 MED ORDER — IOHEXOL 300 MG/ML  SOLN
75.0000 mL | Freq: Once | INTRAMUSCULAR | Status: AC | PRN
  Administered 2019-04-23: 15:00:00 75 mL via INTRAVENOUS

## 2019-04-30 ENCOUNTER — Telehealth: Payer: Self-pay | Admitting: Cardiovascular Disease

## 2019-04-30 MED ORDER — SPIRONOLACTONE 25 MG PO TABS: 12.5000 mg | ORAL_TABLET | Freq: Every day | ORAL | 3 refills | Status: DC

## 2019-04-30 NOTE — Addendum Note (Signed)
Addended by: Debbora Lacrosse R on: 04/30/2019 01:56 PM   Modules accepted: Orders

## 2019-04-30 NOTE — Telephone Encounter (Signed)
Returned pt call. She stated she only had enough medication for 30 days from her pharmacy. I informed her we sent in enough for 90 day supply.

## 2019-04-30 NOTE — Telephone Encounter (Signed)
Pt left message stating she's confused about her medications and is wanting someone to please give her a call  [phone]

## 2019-05-02 NOTE — Telephone Encounter (Signed)
Pt called stating that the pharmacy shorted her medication-- they gave her the 15 that they left out.   Also, said that she would not have anymore refills on that medication

## 2019-05-03 MED ORDER — SPIRONOLACTONE 25 MG PO TABS: 12.5000 mg | ORAL_TABLET | Freq: Every day | ORAL | 3 refills | Status: DC

## 2019-05-03 NOTE — Telephone Encounter (Signed)
REFILL SENT 

## 2019-05-03 NOTE — Addendum Note (Signed)
Addended by: Debbora Lacrosse R on: 05/03/2019 09:22 AM   Modules accepted: Orders

## 2019-06-18 ENCOUNTER — Ambulatory Visit (INDEPENDENT_AMBULATORY_CARE_PROVIDER_SITE_OTHER): Payer: Medicare HMO | Admitting: Cardiovascular Disease

## 2019-06-18 ENCOUNTER — Other Ambulatory Visit: Payer: Self-pay

## 2019-06-18 ENCOUNTER — Encounter: Payer: Self-pay | Admitting: Cardiovascular Disease

## 2019-06-18 VITALS — BP 108/68 | HR 74 | Temp 97.5°F | Ht 62.0 in | Wt 163.0 lb

## 2019-06-18 DIAGNOSIS — I5022 Chronic systolic (congestive) heart failure: Secondary | ICD-10-CM

## 2019-06-18 DIAGNOSIS — I350 Nonrheumatic aortic (valve) stenosis: Secondary | ICD-10-CM

## 2019-06-18 DIAGNOSIS — I480 Paroxysmal atrial fibrillation: Secondary | ICD-10-CM | POA: Diagnosis not present

## 2019-06-18 NOTE — Progress Notes (Signed)
SUBJECTIVE: [name] is a 84 y.o. female with past medical history of paroxysmal atrial fibrillation, mild AS, secondary cardiomyopathy (EF previously 55-60% in 06/2018, reduced to 40-45% by repeat echo in 03/2019), LBBB, CLL, and COPD.  She was hospitalized for a CHF exacerbation in November 2020.  Echocardiogram demonstrated reduced LV systolic function, 40 to [id number].  She denies chest pain, shortness of breath, orthopnea, and syncope.  She seldom has palpitations.    Review of Systems: As per "subjective", otherwise negative.  Allergies  Allergen Reactions  . Codeine Nausea And Vomiting    headache  . Meclizine Other (See Comments)    Made dizziness worse.  . Sulfa Antibiotics Nausea And Vomiting    Stomach upset    Current Outpatient Medications  Medication Sig Dispense Refill  . acetaminophen (TYLENOL) 500 MG tablet Take 500 mg by mouth every 6 (six) hours as needed for mild pain, moderate pain or headache.    . albuterol (PROVENTIL HFA;VENTOLIN HFA) 108 (90 BASE) MCG/ACT inhaler Inhale 2 puffs into the lungs every 6 (six) hours as needed for shortness of breath.     Marland Kitchen albuterol (PROVENTIL) (2.5 MG/3ML) 0.083% nebulizer solution Take 3 mLs (2.5 mg total) by nebulization every 6 (six) hours as needed for wheezing or shortness of breath (when not relieved by inhaler.). 75 mL 12  . apixaban (ELIQUIS) 5 MG TABS tablet Take 1 tablet (5 mg total) by mouth 2 (two) times daily. 180 tablet 3  . bisoprolol (ZEBETA) 5 MG tablet Take 1 tablet (5 mg total) by mouth daily. 90 tablet 3  . cholecalciferol (VITAMIN D) 1000 UNITS tablet Take 1,000 Units by mouth daily.    Marland Kitchen omeprazole (PRILOSEC) 20 MG capsule Take 20 mg by mouth at bedtime.     Marland Kitchen spironolactone (ALDACTONE) 25 MG tablet Take 0.5 tablets (12.5 mg total) by mouth daily. 45 tablet 3   No current facility-administered medications for this visit.    Past Medical History:  Diagnosis Date  . Bowel obstruction ([place])     . Breast cancer ([place])   . Breast cancer, stage 3 ([place]) 07/21/2011  . Bronchitis   . CLL (chronic lymphocytic leukemia) ([place]) 07/21/2011  . Migraines   . PAF (paroxysmal atrial fibrillation) ([place])   . Pneumonia     Past Surgical History:  Procedure Laterality Date  . ABDOMINAL HYSTERECTOMY    . APPENDECTOMY    . CHOLECYSTECTOMY    . LAPAROTOMY N/A 06/08/2015   Procedure: EXPLORATORY LAPAROTOMY;  Surgeon: Aviva Signs, MD;  Location: [facility];  Service: General;  Laterality: N/A;  . LYSIS OF ADHESION N/A 06/08/2015   Procedure: LYSIS OF ADHESION;  Surgeon: Aviva Signs, MD;  Location: [facility];  Service: General;  Laterality: N/A;  . MASTECTOMY     right  . TOTAL HIP ARTHROPLASTY     right    Social History   Socioeconomic History  . Marital status: Widowed    Spouse name: Not on file  . Number of children: Not on file  . Years of education: 34  . Highest education level: Not on file  Occupational History  . Not on file  Tobacco Use  . Smoking status: Former Smoker    Quit date: 10/26/1978    Years since quitting: 40.6  . Smokeless tobacco: Never Used  Substance and Sexual Activity  . Alcohol use: No  . Drug use: No  . Sexual activity: Not Currently  Other Topics Concern  .  Not on file  Social History Narrative   Pt lives by herself   Social Determinants of Health   Financial Resource Strain: Unknown  . Difficulty of Paying Living Expenses: Patient refused  Food Insecurity: Unknown  . Worried About Charity fundraiser in the Last Year: Patient refused  . Ran Out of Food in the Last Year: Patient refused  Transportation Needs: Unknown  . Lack of Transportation (Medical): Patient refused  . Lack of Transportation (Non-Medical): Patient refused  Physical Activity: Unknown  . Days of Exercise per Week: Patient refused  . Minutes of Exercise per Session: Patient refused  Stress: Stress Concern Present  . Feeling of Stress : Rather much  Social Connections: Unknown  .  Frequency of Communication with Friends and Family: Patient refused  . Frequency of Social Gatherings with Friends and Family: Patient refused  . Attends Religious Services: Patient refused  . Active Member of Clubs or Organizations: Patient refused  . Attends Archivist Meetings: Patient refused  . Marital Status: Patient refused  Intimate Partner Violence: Unknown  . Fear of Current or Ex-Partner: Patient refused  . Emotionally Abused: Patient refused  . Physically Abused: Patient refused  . Sexually Abused: Patient refused     Vitals:   06/18/19 1402  BP: 108/68  Pulse: 74  Temp: (!) 97.5 F (36.4 C)  SpO2: 97%  Weight: 163 lb (73.9 kg)  Height: 5\' 2"  (1.575 m)    Wt Readings from Last 3 Encounters:  06/18/19 163 lb (73.9 kg)  04/10/19 159 lb 12.8 oz (72.5 kg)  04/04/19 167 lb (75.8 kg)     PHYSICAL EXAM General: NAD HEENT: Normal. Neck: No JVD, no thyromegaly. Lungs: Clear to auscultation bilaterally with normal respiratory effort. CV: Regular rate and rhythm, normal S1/S2, no S3/S4, no murmur. No pretibial or periankle edema.  Abdomen: Soft, nontender, no distention.  Neurologic: Alert and oriented.  Psych: Normal affect. Skin: Normal. Musculoskeletal: No gross deformities.      Labs: Lab Results  Component Value Date/Time   K 4.3 04/03/2019 12:41 PM   K 4.0 10/12/2012 10:38 AM   BUN 18 04/03/2019 12:41 PM   BUN 16.1 10/12/2012 10:38 AM   CREATININE 0.73 04/03/2019 12:41 PM   CREATININE 0.9 10/12/2012 10:38 AM   ALT 15 04/03/2019 12:07 PM   ALT 20 10/12/2012 10:38 AM   TSH 2.477 03/25/2019 06:59 PM   TSH 2.865 09/14/2007 10:39 PM   HGB 12.0 04/03/2019 12:41 PM   HGB 13.5 10/12/2012 10:38 AM     Lipids: Lab Results  Component Value Date/Time   LDLCALC 86 04/03/2019 12:41 PM   CHOL 147 04/03/2019 12:41 PM   TRIG 131 04/03/2019 12:41 PM   HDL 35 (L) 04/03/2019 12:41 PM      Prior CV studies:   The following studies were reviewed  today:  Echocardiogram: 03/26/2019 IMPRESSIONS   1. Left ventricular ejection fraction, by visual estimation, is 40 to 45%. The left ventricle has mild to moderately decreased function. There is moderately increased left ventricular hypertrophy. 2. Abnormal septal motion consistent with left bundle branch block. 3. Left ventricular diastolic parameters are consistent with Grade II diastolic dysfunction (pseudonormalization). 4. Global right ventricle has normal systolic function.The right ventricular size is normal. No increase in right ventricular wall thickness. 5. Left atrial size was severely dilated. 6. Right atrial size was normal. 7. Presence of pericardial fat pad. 8. Mild to moderate aortic valve annular calcification. 9. Moderate mitral annular calcification.  10. The mitral valve is grossly normal. Mild mitral valve regurgitation. 11. The tricuspid valve is grossly normal. Tricuspid valve regurgitation is mild. 12. Aortic valve mean gradient measures 11.0 mmHg. 13. The aortic valve is tricuspid. Aortic valve regurgitation is not visualized. Mild aortic valve stenosis. 14. The pulmonic valve was grossly normal. Pulmonic valve regurgitation is trivial. 15. Mildly elevated pulmonary artery systolic pressure. 16. The tricuspid regurgitant velocity is 3.04 m/s, and with an assumed right atrial pressure of 3 mmHg, the estimated right ventricular systolic pressure is mildly elevated at 40.0 mmHg. 17. The inferior vena cava is normal in size with greater than 50% respiratory variability, suggesting right atrial pressure of 3 mmHg.   ASSESSMENT AND PLAN: 1.  Chronic systolic heart failure/secondary cardiomyopathy: LVEF 40 to 45%.  Continue bisoprolol and spironolactone.  Unable to add ACE inhibitor or angiotensin receptor blocker due to low normal blood pressure.  Plan for repeat echocardiogram in 3 to 4 months.  2.  Paroxysmal atrial fibrillation: Asymptomatic.  Continue  bisoprolol for rate control.  Continue Eliquis for anticoagulation.  3.  Aortic stenosis: Mild in severity by echocardiogram November 2020.  I will monitor.    Disposition: Follow up 6 months virtual   Kate Sable, M.D., F.A.C.C.

## 2019-06-18 NOTE — Patient Instructions (Addendum)
Medication Instructions:  Your physician recommends that you continue on your current medications as directed. Please refer to the Current Medication list given to you today.  *If you need a refill on your cardiac medications before your next appointment, please call your pharmacy*  Lab Work: [place]  If you have labs (blood work) drawn today and your tests are completely normal, you will receive your results only by: Marland Kitchen MyChart Message (if you have MyChart) OR . A paper copy in the mail If you have any lab test that is abnormal or we need to change your treatment, we will call you to review the results.  Testing/Procedures: NONE   Follow-Up: At [facility], you and your health needs are our priority.  As part of our continuing mission to provide you with exceptional heart care, we have created designated Provider Care Teams.  These Care Teams include your primary Cardiologist (physician) and Advanced Practice Providers (APPs -  Physician Assistants and Nurse Practitioners) who all work together to provide you with the care you need, when you need it.  Your next appointment:   6 month(s)  The format for your next appointment:  Virtual   Provider:   Kate Sable, MD  Other Instructions NONE     Thank you for choosing [facility] !

## 2019-09-30 ENCOUNTER — Other Ambulatory Visit (HOSPITAL_COMMUNITY): Payer: Self-pay | Admitting: *Deleted

## 2019-09-30 DIAGNOSIS — C50911 Malignant neoplasm of unspecified site of right female breast: Secondary | ICD-10-CM

## 2019-09-30 DIAGNOSIS — C9110 Chronic lymphocytic leukemia of B-cell type not having achieved remission: Secondary | ICD-10-CM

## 2019-10-01 ENCOUNTER — Inpatient Hospital Stay (HOSPITAL_COMMUNITY): Payer: Medicare HMO | Attending: Hematology

## 2019-10-01 ENCOUNTER — Other Ambulatory Visit: Payer: Self-pay

## 2019-10-01 DIAGNOSIS — R61 Generalized hyperhidrosis: Secondary | ICD-10-CM | POA: Diagnosis not present

## 2019-10-01 DIAGNOSIS — N951 Menopausal and female climacteric states: Secondary | ICD-10-CM | POA: Insufficient documentation

## 2019-10-01 DIAGNOSIS — Z9221 Personal history of antineoplastic chemotherapy: Secondary | ICD-10-CM | POA: Insufficient documentation

## 2019-10-01 DIAGNOSIS — Z853 Personal history of malignant neoplasm of breast: Secondary | ICD-10-CM | POA: Insufficient documentation

## 2019-10-01 DIAGNOSIS — C9110 Chronic lymphocytic leukemia of B-cell type not having achieved remission: Secondary | ICD-10-CM | POA: Diagnosis not present

## 2019-10-01 DIAGNOSIS — C50911 Malignant neoplasm of unspecified site of right female breast: Secondary | ICD-10-CM

## 2019-10-01 DIAGNOSIS — Z9011 Acquired absence of right breast and nipple: Secondary | ICD-10-CM | POA: Insufficient documentation

## 2019-10-01 LAB — CBC WITH DIFFERENTIAL/PLATELET
Abs Immature Granulocytes: 0.09 10*3/uL — ABNORMAL HIGH (ref 0.00–0.07)
Basophils Absolute: 0.2 10*3/uL — ABNORMAL HIGH (ref 0.0–0.1)
Basophils Relative: 1 %
Eosinophils Absolute: 0.3 10*3/uL (ref 0.0–0.5)
Eosinophils Relative: 1 %
HCT: 42.1 % (ref 36.0–46.0)
Hemoglobin: 12.7 g/dL (ref 12.0–15.0)
Immature Granulocytes: 0 %
Lymphocytes Relative: 85 %
Lymphs Abs: 39 10*3/uL — ABNORMAL HIGH (ref 0.7–4.0)
MCH: 28.9 pg (ref 26.0–34.0)
MCHC: 30.2 g/dL (ref 30.0–36.0)
MCV: 95.9 fL (ref 80.0–100.0)
Monocytes Absolute: 1.4 10*3/uL — ABNORMAL HIGH (ref 0.1–1.0)
Monocytes Relative: 3 %
Neutro Abs: 4.7 10*3/uL (ref 1.7–7.7)
Neutrophils Relative %: 10 %
Platelets: 232 10*3/uL (ref 150–400)
RBC: 4.39 MIL/uL (ref 3.87–5.11)
RDW: 15.3 % (ref 11.5–15.5)
WBC: 45.7 10*3/uL — ABNORMAL HIGH (ref 4.0–10.5)
nRBC: 0 % (ref 0.0–0.2)

## 2019-10-01 LAB — COMPREHENSIVE METABOLIC PANEL
ALT: 16 U/L (ref 0–44)
AST: 21 U/L (ref 15–41)
Albumin: 4.2 g/dL (ref 3.5–5.0)
Alkaline Phosphatase: 44 U/L (ref 38–126)
Anion gap: 12 (ref 5–15)
BUN: 25 mg/dL — ABNORMAL HIGH (ref 8–23)
CO2: 23 mmol/L (ref 22–32)
Calcium: 8.8 mg/dL — ABNORMAL LOW (ref 8.9–10.3)
Chloride: 108 mmol/L (ref 98–111)
Creatinine, Ser: 0.82 mg/dL (ref 0.44–1.00)
GFR calc Af Amer: >60 mL/min (ref >60)
GFR calc non Af Amer: >60 mL/min (ref >60)
Glucose, Bld: 210 mg/dL — ABNORMAL HIGH (ref 70–99)
Potassium: 4.1 mmol/L (ref 3.5–5.1)
Sodium: 143 mmol/L (ref 135–145)
Total Bilirubin: 0.6 mg/dL (ref 0.3–1.2)
Total Protein: 6.8 g/dL (ref 6.5–8.1)

## 2019-10-01 LAB — LACTATE DEHYDROGENASE: LDH: 144 U/L (ref 98–192)

## 2019-10-08 ENCOUNTER — Inpatient Hospital Stay (HOSPITAL_COMMUNITY): Payer: Medicare HMO | Admitting: Nurse Practitioner

## 2019-10-08 ENCOUNTER — Other Ambulatory Visit: Payer: Self-pay

## 2019-10-08 DIAGNOSIS — C9110 Chronic lymphocytic leukemia of B-cell type not having achieved remission: Secondary | ICD-10-CM | POA: Diagnosis not present

## 2019-10-08 NOTE — Progress Notes (Signed)
[place], [name]   CLINIC:  Medical Oncology/Hematology  PCP:  Lemmie Evens, MD [place] [address] [phone]   REASON FOR VISIT: Follow-up for CLL   CURRENT THERAPY: Clinical surveillance   INTERVAL HISTORY:  [name] 84 y.o. female returns for routine follow-up for CLL.  Patient reports she is doing well since her last visit.  She denies any new adenopathy.  She does report hot flashes and night sweats however this has been going on for 25+ years.  She denies any worsening.  She denies any new pains. Denies any nausea, vomiting, or diarrhea. Denies any new pains. Had not noticed any recent bleeding such as epistaxis, hematuria or hematochezia. Denies recent chest pain on exertion, shortness of breath on minimal exertion, pre-syncopal episodes, or palpitations. Denies any numbness or tingling in hands or feet. Denies any recent fevers, infections, or recent hospitalizations. Patient reports appetite at 50% and energy level at 75%.  She is eating well maintain her weight at this time.    REVIEW OF SYSTEMS:  Review of Systems  Gastrointestinal: Positive for diarrhea.  All other systems reviewed and are negative.    PAST MEDICAL/SURGICAL HISTORY:  Past Medical History:  Diagnosis Date  . Bowel obstruction ([place])   . Breast cancer ([place])   . Breast cancer, stage 3 ([place]) 07/21/2011  . Bronchitis   . CLL (chronic lymphocytic leukemia) ([place]) 07/21/2011  . Migraines   . PAF (paroxysmal atrial fibrillation) ([place])   . Pneumonia    Past Surgical History:  Procedure Laterality Date  . ABDOMINAL HYSTERECTOMY    . APPENDECTOMY    . CHOLECYSTECTOMY    . LAPAROTOMY N/A 06/08/2015   Procedure: EXPLORATORY LAPAROTOMY;  Surgeon: Aviva Signs, MD;  Location: [facility];  Service: General;  Laterality: N/A;  . LYSIS OF ADHESION N/A 06/08/2015   Procedure: LYSIS OF ADHESION;  Surgeon: Aviva Signs, MD;  Location: [facility]  ORS;  Service: General;  Laterality: N/A;  . MASTECTOMY     right  . TOTAL HIP ARTHROPLASTY     right     SOCIAL HISTORY:  Social History   Socioeconomic History  . Marital status: Widowed    Spouse name: Not on file  . Number of children: Not on file  . Years of education: 13  . Highest education level: Not on file  Occupational History  . Not on file  Tobacco Use  . Smoking status: Former Smoker    Quit date: 10/26/1978    Years since quitting: 40.9  . Smokeless tobacco: Never Used  Substance and Sexual Activity  . Alcohol use: No  . Drug use: No  . Sexual activity: Not Currently  Other Topics Concern  . Not on file  Social History Narrative   Pt lives by herself   Social Determinants of Health   Financial Resource Strain:   . Difficulty of Paying Living Expenses:   Food Insecurity:   . Worried About Charity fundraiser in the Last Year:   . Arboriculturist in the Last Year:   Transportation Needs:   . Film/video editor (Medical):   Marland Kitchen Lack of Transportation (Non-Medical):   Physical Activity:   . Days of Exercise per Week:   . Minutes of Exercise per Session:   Stress:   . Feeling of Stress :   Social Connections:   . Frequency of Communication with Friends and Family:   .  Frequency of Social Gatherings with Friends and Family:   . Attends Religious Services:   . Active Member of Clubs or Organizations:   . Attends Archivist Meetings:   Marland Kitchen Marital Status:   Intimate Partner Violence:   . Fear of Current or Ex-Partner:   . Emotionally Abused:   Marland Kitchen Physically Abused:   . Sexually Abused:     FAMILY HISTORY:  Family History  Problem Relation Age of Onset  . Cancer Brother   . Cancer Other     CURRENT MEDICATIONS:  Outpatient Encounter Medications as of 10/08/2019  Medication Sig  . apixaban (ELIQUIS) 5 MG TABS tablet Take 1 tablet (5 mg total) by mouth 2 (two) times daily.  . bisoprolol (ZEBETA) 5 MG tablet Take 1 tablet (5 mg total)  by mouth daily.  . cholecalciferol (VITAMIN D) 1000 UNITS tablet Take 1,000 Units by mouth daily.  Marland Kitchen omeprazole (PRILOSEC) 20 MG capsule Take 20 mg by mouth at bedtime.   Marland Kitchen spironolactone (ALDACTONE) 25 MG tablet Take 0.5 tablets (12.5 mg total) by mouth daily.  Marland Kitchen acetaminophen (TYLENOL) 500 MG tablet Take 500 mg by mouth every 6 (six) hours as needed for mild pain, moderate pain or headache.  . albuterol (PROVENTIL HFA;VENTOLIN HFA) 108 (90 BASE) MCG/ACT inhaler Inhale 2 puffs into the lungs every 6 (six) hours as needed for shortness of breath.   Marland Kitchen albuterol (PROVENTIL) (2.5 MG/3ML) 0.083% nebulizer solution Take 3 mLs (2.5 mg total) by nebulization every 6 (six) hours as needed for wheezing or shortness of breath (when not relieved by inhaler.). (Patient not taking: Reported on 10/08/2019)  . [DISCONTINUED] traMADol (ULTRAM) 50 MG tablet Take 50 mg by mouth 3 (three) times daily.   No facility-administered encounter medications on file as of 10/08/2019.    ALLERGIES:  Allergies  Allergen Reactions  . Codeine Nausea And Vomiting    headache  . Meclizine Other (See Comments)    Made dizziness worse.  . Sulfa Antibiotics Nausea And Vomiting    Stomach upset     PHYSICAL EXAM:  ECOG Performance status: 1  Vitals:   10/08/19 1342  BP: 118/66  Pulse: 63  Resp: 18  Temp: (!) 97.3 F (36.3 C)  SpO2: 97%   Filed Weights   10/08/19 1342  Weight: 177 lb (80.3 kg)   Physical Exam Constitutional:      Appearance: Normal appearance. She is normal weight.  Cardiovascular:     Rate and Rhythm: Normal rate and regular rhythm.     Heart sounds: Normal heart sounds.  Pulmonary:     Effort: Pulmonary effort is normal.     Breath sounds: Normal breath sounds.  Abdominal:     General: Bowel sounds are normal.     Palpations: Abdomen is soft.  Musculoskeletal:        General: Normal range of motion.  Skin:    General: Skin is warm.  Neurological:     Mental Status: She is alert and  oriented to person, place, and time. Mental status is at baseline.  Psychiatric:        Mood and Affect: Mood normal.        Behavior: Behavior normal.        Thought Content: Thought content normal.        Judgment: Judgment normal.      LABORATORY DATA:  I have reviewed the labs as listed.  CBC    Component Value Date/Time   WBC 45.7 (H)  10/01/2019 1235   RBC 4.39 10/01/2019 1235   HGB 12.7 10/01/2019 1235   HGB 13.5 10/12/2012 1038   HCT 42.1 10/01/2019 1235   HCT 42.7 10/12/2012 1038   PLT 232 10/01/2019 1235   PLT 241 10/12/2012 1038   MCV 95.9 10/01/2019 1235   MCV 88.9 10/12/2012 1038   MCH 28.9 10/01/2019 1235   MCHC 30.2 10/01/2019 1235   RDW 15.3 10/01/2019 1235   RDW 14.4 10/12/2012 1038   LYMPHSABS 39.0 (H) 10/01/2019 1235   LYMPHSABS 57.9 (H) 10/12/2012 1038   MONOABS 1.4 (H) 10/01/2019 1235   MONOABS 1.2 (H) 10/12/2012 1038   EOSABS 0.3 10/01/2019 1235   EOSABS 0.4 10/12/2012 1038   BASOSABS 0.2 (H) 10/01/2019 1235   BASOSABS 0.3 (H) 10/12/2012 1038   CMP Latest Ref Rng & Units 10/01/2019 04/03/2019 04/03/2019  Glucose 70 - 99 mg/dL 210(H) 104(H) 108(H)  BUN 8 - 23 mg/dL 25(H) 18 18  Creatinine 0.44 - 1.00 mg/dL 0.82 0.73 0.75  Sodium 135 - 145 mmol/L 143 141 140  Potassium 3.5 - 5.1 mmol/L 4.1 4.3 4.5  Chloride 98 - 111 mmol/L 108 106 106  CO2 22 - 32 mmol/L 23 25 24   Calcium 8.9 - 10.3 mg/dL 8.8(L) 9.0 8.9  Total Protein 6.5 - 8.1 g/dL 6.8 - 6.1(L)  Total Bilirubin 0.3 - 1.2 mg/dL 0.6 - 0.8  Alkaline Phos 38 - 126 U/L 44 - 39  AST 15 - 41 U/L 21 - 20  ALT 0 - 44 U/L 16 - 15   All questions were answered to patient's stated satisfaction. Encouraged patient to call with any new concerns or questions before his next visit to the cancer center and we can certain see him sooner, if needed.     ASSESSMENT & PLAN:  CLL (chronic lymphocytic leukemia) ([place]) 1.  CLL: -Patient was diagnosed 25+ years ago.  Has never required treatment. -Patient reports  ongoing hot flashes and night sweats.  She denies any adenopathy.  No other B symptoms. -Labs remain relatively stable.  Latest labs done on 10/01/2019 showed WBC 45.7, hemoglobin 12.7, platelets 232. -On 03/26/2019 patient had a CT scan of her chest which revealed a 17 mm irregular soft tissue density along with the medial aspect of the left lung base. -Repeat CT of chest on 04/23/2019 showed interval resolution of previously demonstrated small pleural effusions.  No suspicious pulmonary nodules. -Patient will follow up in 6 months with repeat labs.  2.  Stage III right breast cancer: -Patient was diagnosed in 1980 01/1998. -Treated with neoadjuvant chemotherapy followed by right mastectomy then XRT. -Last mammogram on 04/01/2019 was B RADS category 1 negative.     Orders placed this encounter:  Orders Placed This Encounter  Procedures  . Lactate dehydrogenase  . CBC with Differential/Platelet  . Comprehensive metabolic panel  . Vitamin B12  . VITAMIN D 25 Hydroxy (Vit-D Deficiency, Fractures)  . Folate      Francene Finders, FNP-C [place] [phone]

## 2019-10-08 NOTE — Patient Instructions (Signed)
[facility] Discharge Instructions  Follow up in 6 months with labs    Thank you for choosing [facility] at [facility] to provide your oncology and hematology care.  To afford each patient quality time with our provider, please arrive at least 15 minutes before your scheduled appointment time.   If you have a lab appointment with the Cancer Center please come in thru the Main Entrance and check in at the main information desk.  You need to re-schedule your appointment should you arrive 10 or more minutes late.  We strive to give you quality time with our providers, and arriving late affects you and other patients whose appointments are after yours.  Also, if you no show three or more times for appointments you may be dismissed from the clinic at the providers discretion.     Again, thank you for choosing [facility].  Our hope is that these requests will decrease the amount of time that you wait before being seen by our physicians.       _____________________________________________________________  Should you have questions after your visit to [facility], please contact our office at [phone] between the hours of 8:00 a.m. and 4:30 p.m.  Voicemails left after 4:00 p.m. will not be returned until the following business day.  For prescription refill requests, have your pharmacy contact our office and allow 72 hours.    Due to Covid, you will need to wear a mask upon entering the hospital. If you do not have a mask, a mask will be given to you at the Main Entrance upon arrival. For doctor visits, patients may have 1 support person with them. For treatment visits, patients can not have anyone with them due to social distancing guidelines and our immunocompromised population.      

## 2019-10-08 NOTE — Assessment & Plan Note (Signed)
1.  CLL: -Patient was diagnosed 25+ years ago.  Has never required treatment. -Patient reports ongoing hot flashes and night sweats.  She denies any adenopathy.  No other B symptoms. -Labs remain relatively stable.  Latest labs done on 10/01/2019 showed WBC 45.7, hemoglobin 12.7, platelets 232. -On 03/26/2019 patient had a CT scan of her chest which revealed a 17 mm irregular soft tissue density along with the medial aspect of the left lung base. -Repeat CT of chest on 04/23/2019 showed interval resolution of previously demonstrated small pleural effusions.  No suspicious pulmonary nodules. -Patient will follow up in 6 months with repeat labs.  2.  Stage III right breast cancer: -Patient was diagnosed in 1980 01/1998. -Treated with neoadjuvant chemotherapy followed by right mastectomy then XRT. -Last mammogram on 04/01/2019 was B RADS category 1 negative.

## 2019-11-12 ENCOUNTER — Telehealth: Payer: Self-pay | Admitting: Cardiovascular Disease

## 2019-11-12 NOTE — Telephone Encounter (Signed)
Patient called and said she was having dizzy spells. She checked her BP at 1:50PM- 106/55 Pulse: 58, She's sitting down. She would like for a nurse to call her

## 2019-12-05 ENCOUNTER — Other Ambulatory Visit: Payer: Self-pay

## 2019-12-05 ENCOUNTER — Ambulatory Visit (INDEPENDENT_AMBULATORY_CARE_PROVIDER_SITE_OTHER): Payer: Medicare HMO | Admitting: Internal Medicine

## 2019-12-05 ENCOUNTER — Encounter: Payer: Self-pay | Admitting: Internal Medicine

## 2019-12-05 VITALS — BP 126/64 | HR 71 | Ht 62.0 in | Wt 179.0 lb

## 2019-12-05 DIAGNOSIS — I350 Nonrheumatic aortic (valve) stenosis: Secondary | ICD-10-CM

## 2019-12-05 DIAGNOSIS — I5022 Chronic systolic (congestive) heart failure: Secondary | ICD-10-CM | POA: Diagnosis not present

## 2019-12-05 MED ORDER — SPIRONOLACTONE 25 MG PO TABS: 12.5000 mg | ORAL_TABLET | Freq: Every day | ORAL | 1 refills | Status: DC

## 2019-12-05 MED ORDER — APIXABAN 5 MG PO TABS: 5.0000 mg | ORAL_TABLET | Freq: Two times a day (BID) | ORAL | 1 refills | Status: DC

## 2019-12-05 MED ORDER — BISOPROLOL FUMARATE 5 MG PO TABS: 5.0000 mg | ORAL_TABLET | Freq: Every day | ORAL | 1 refills | Status: DC

## 2019-12-05 NOTE — Patient Instructions (Signed)
Medication Instructions:   Your physician recommends that you continue on your current medications as directed. Please refer to the Current Medication list given to you today.  *If you need a refill on your cardiac medications before your next appointment, please call your pharmacy*   Lab Work: [place]   If you have labs (blood work) drawn today and your tests are completely normal, you will receive your results only by: Marland Kitchen MyChart Message (if you have MyChart) OR . A paper copy in the mail If you have any lab test that is abnormal or we need to change your treatment, we will call you to review the results.   Testing/Procedures: BEFORE FOLLOW UP APPOINTMENT January 2022 Your physician has requested that you have an echocardiogram. Echocardiography is a painless test that uses sound waves to create images of your heart. It provides your doctor with information about the size and shape of your heart and how well your heart's chambers and valves are working. This procedure takes approximately one hour. There are no restrictions for this procedure.   Follow-Up: At [facility], you and your health needs are our priority.  As part of our continuing mission to provide you with exceptional heart care, we have created designated Provider Care Teams.  These Care Teams include your primary Cardiologist (physician) and Advanced Practice Providers (APPs -  Physician Assistants and Nurse Practitioners) who all work together to provide you with the care you need, when you need it.  We recommend signing up for the patient portal called "MyChart".  Sign up information is provided on this After Visit Summary.  MyChart is used to connect with patients for Virtual Visits (Telemedicine).  Patients are able to view lab/test results, encounter notes, upcoming appointments, etc.  Non-urgent messages can be sent to your provider as well.   To learn more about what you can do with MyChart, go to  [email or web address].    Your next appointment:   6 month(s)  The format for your next appointment:   In Person  Provider:   You may see Dr Dorris Carnes or one of the following Advanced Practice Providers on your designated Care Team:    Bernerd Pho, PA-C   Ermalinda Barrios, [place]     Other Instructions

## 2019-12-05 NOTE — Progress Notes (Signed)
Cardiology Office Note   Date:  12/05/2019   ID:  [name], DOB 1936-04-13, MRN [id number]  PCP:  Lemmie Evens, MD  Cardiologist:   Dorris Carnes, MD    F/U of CHF and atrial fib and AS   History of Present Illness: [name] is a 84 y.o. female with a history of PAF, mild AS, systolic CHF (LVEF 40 to 19% in 03/2019), LBBB, CLL and COPD   She wa hosp in Nov 2020 with a CHF exacerbation.    She was previously followed by [facility], last seen in Jan 2021  [facility] [place] seen the pt says her breathing is stable   She has some L sided CP but only when laying on L side in bed   Not with activity    Notes occasional palpitations   Tells her heart to settle down Has chronic unsteadiness, dizziness   No syncope       Current Meds  Medication Sig  . acetaminophen (TYLENOL) 500 MG tablet Take 500 mg by mouth every 6 (six) hours as needed for mild pain, moderate pain or headache.  . albuterol (PROVENTIL HFA;VENTOLIN HFA) 108 (90 BASE) MCG/ACT inhaler Inhale 2 puffs into the lungs every 6 (six) hours as needed for shortness of breath.   Marland Kitchen albuterol (PROVENTIL) (2.5 MG/3ML) 0.083% nebulizer solution Take 3 mLs (2.5 mg total) by nebulization every 6 (six) hours as needed for wheezing or shortness of breath (when not relieved by inhaler.).  Marland Kitchen apixaban (ELIQUIS) 5 MG TABS tablet Take 1 tablet (5 mg total) by mouth 2 (two) times daily.  . bisoprolol (ZEBETA) 5 MG tablet Take 1 tablet (5 mg total) by mouth daily.  . cholecalciferol (VITAMIN D) 1000 UNITS tablet Take 1,000 Units by mouth daily.  Marland Kitchen omeprazole (PRILOSEC) 20 MG capsule Take 20 mg by mouth at bedtime.   Marland Kitchen spironolactone (ALDACTONE) 25 MG tablet Take 0.5 tablets (12.5 mg total) by mouth daily.     Allergies:   Codeine, Meclizine, and Sulfa antibiotics   Past Medical History:  Diagnosis Date  . Bowel obstruction ([place])   . Breast cancer ([place])   . Breast cancer, stage 3 ([place]) 07/21/2011  . Bronchitis   . CLL (chronic  lymphocytic leukemia) ([place]) 07/21/2011  . Migraines   . PAF (paroxysmal atrial fibrillation) ([place])   . Pneumonia     Past Surgical History:  Procedure Laterality Date  . ABDOMINAL HYSTERECTOMY    . APPENDECTOMY    . CHOLECYSTECTOMY    . LAPAROTOMY N/A 06/08/2015   Procedure: EXPLORATORY LAPAROTOMY;  Surgeon: Aviva Signs, MD;  Location: [facility];  Service: General;  Laterality: N/A;  . LYSIS OF ADHESION N/A 06/08/2015   Procedure: LYSIS OF ADHESION;  Surgeon: Aviva Signs, MD;  Location: [facility];  Service: General;  Laterality: N/A;  . MASTECTOMY     right  . TOTAL HIP ARTHROPLASTY     right     Social History:  The patient  reports that she quit smoking about 41 years ago. She has never used smokeless tobacco. She reports that she does not drink alcohol and does not use drugs.   Family History:  The patient's family history includes Cancer in her brother and another family member.    ROS:  Please see the history of present illness. All other systems are reviewed and  Negative to the above problem except as noted.    PHYSICAL EXAM: VS:  BP 126/64   Pulse 71  Ht 5\' 2"  (1.575 m)   Wt 179 lb (81.2 kg)   SpO2 96%   BMI 32.74 kg/m   GEN: Well nourished, well developed, in no acute distress  HEENT: normal  Neck: no JVD, carotid bruits, or masses Cardiac: RRR; Gri II/VI systolic murmur at base  , rubs, or gallops,no LE edema  Respiratory:  clear to auscultation bilaterally, normal work of breathing GI: soft, nontender, nondistended, + BS  No hepatomegaly  MS: no deformity Moving all extremities   Skin: warm and dry, no rash Neuro:  Strength and sensation are intact Psych: euthymic mood, full affect   EKG:  EKG is not ordered today.   Echo   Nov 2020  1. Left ventricular ejection fraction, by visual estimation, is 40 to 45%. The left ventricle has mild to moderately decreased function. There is moderately increased left ventricular hypertrophy. 2. Abnormal septal motion  consistent with left bundle branch block. 3. Left ventricular diastolic parameters are consistent with Grade II diastolic dysfunction (pseudonormalization). 4. Global right ventricle has normal systolic function.The right ventricular size is normal. No increase in right ventricular wall thickness. 5. Left atrial size was severely dilated. 6. Right atrial size was normal. 7. Presence of pericardial fat pad. 8. Mild to moderate aortic valve annular calcification. 9. Moderate mitral annular calcification. 10. The mitral valve is grossly normal. Mild mitral valve regurgitation. 11. The tricuspid valve is grossly normal. Tricuspid valve regurgitation is mild. 12. Aortic valve mean gradient measures 11.0 mmHg. 13. The aortic valve is tricuspid. Aortic valve regurgitation is not visualized. Mild aortic valve stenosis. 14. The pulmonic valve was grossly normal. Pulmonic valve regurgitation is trivial. 15. Mildly elevated pulmonary artery systolic pressure. 16. The tricuspid regurgitant velocity is 3.04 m/s, and with an assumed right atrial pressure of 3 mmHg, the estimated right ventricular systolic pressure is mildly elevated at 40.0 mmHg. 17. The inferior vena cava is normal in size with greater than 50% respiratory variability, suggesting right atrial pressure of 3 mmHg. In comparison to the previous echocardiogram(s): Compared  Lipid Panel    Component Value Date/Time   CHOL 147 04/03/2019 1241   TRIG 131 04/03/2019 1241   HDL 35 (L) 04/03/2019 1241   CHOLHDL 4.2 04/03/2019 1241   VLDL 26 04/03/2019 1241   LDLCALC 86 04/03/2019 1241      Wt Readings from Last 3 Encounters:  12/05/19 179 lb (81.2 kg)  10/08/19 177 lb (80.3 kg)  06/18/19 163 lb (73.9 kg)      ASSESSMENT AND PLAN:  1  Chronic systolic CHF   Volume status is OK   I would keep on same regimen   She is unsteady some on feet  She has been challenged with ACE I and ARB  BP did not tolerate  Given both I would keep on  same regimen   F/U in Jan with Echo priog   2  Hx PAF  Occasional palpitations    Keep on Eliquis   3  Aortic stenosis  Mild on echo   Will repeat echo in Jan 2022  4  CP  Atypical  Not cardiac      Current medicines are reviewed at length with the patient today.  The patient does not have concerns regarding medicines.  Signed, Dorris Carnes, MD  12/05/2019 1:23 PM    [facility] [address], [place], [place]  [address] Phone: [phone]; Fax: [phone]

## 2019-12-23 ENCOUNTER — Telehealth: Payer: Medicare HMO | Admitting: Cardiovascular Disease

## 2020-01-15 ENCOUNTER — Other Ambulatory Visit (HOSPITAL_COMMUNITY)
Admission: RE | Admit: 2020-01-15 | Discharge: 2020-01-15 | Disposition: A | Discharge status: Home / Self Care | Payer: Medicare HMO | Source: Ambulatory Visit | Attending: Family Medicine | Admitting: Family Medicine

## 2020-01-15 ENCOUNTER — Other Ambulatory Visit: Payer: Self-pay

## 2020-01-15 ENCOUNTER — Ambulatory Visit (HOSPITAL_COMMUNITY)
Admission: RE | Admit: 2020-01-15 | Discharge: 2020-01-15 | Disposition: A | Discharge status: Home / Self Care | Payer: Medicare HMO | Source: Ambulatory Visit | Attending: Nurse Practitioner | Admitting: Nurse Practitioner

## 2020-01-15 ENCOUNTER — Other Ambulatory Visit (HOSPITAL_COMMUNITY): Payer: Self-pay | Admitting: Internal Medicine

## 2020-01-15 ENCOUNTER — Other Ambulatory Visit (HOSPITAL_COMMUNITY): Payer: Self-pay | Admitting: Nurse Practitioner

## 2020-01-15 DIAGNOSIS — R55 Syncope and collapse: Secondary | ICD-10-CM | POA: Diagnosis present

## 2020-01-15 DIAGNOSIS — M25551 Pain in right hip: Secondary | ICD-10-CM

## 2020-01-15 DIAGNOSIS — I480 Paroxysmal atrial fibrillation: Secondary | ICD-10-CM | POA: Diagnosis present

## 2020-01-15 DIAGNOSIS — C9111 Chronic lymphocytic leukemia of B-cell type in remission: Secondary | ICD-10-CM | POA: Diagnosis present

## 2020-01-15 DIAGNOSIS — J449 Chronic obstructive pulmonary disease, unspecified: Secondary | ICD-10-CM | POA: Insufficient documentation

## 2020-01-15 DIAGNOSIS — Z7901 Long term (current) use of anticoagulants: Secondary | ICD-10-CM | POA: Insufficient documentation

## 2020-01-15 DIAGNOSIS — Z1231 Encounter for screening mammogram for malignant neoplasm of breast: Secondary | ICD-10-CM

## 2020-01-15 LAB — COMPREHENSIVE METABOLIC PANEL
ALT: 26 U/L (ref 0–44)
AST: 27 U/L (ref 15–41)
Albumin: 4.4 g/dL (ref 3.5–5.0)
Alkaline Phosphatase: 44 U/L (ref 38–126)
Anion gap: 11 (ref 5–15)
BUN: 32 mg/dL — ABNORMAL HIGH (ref 8–23)
CO2: 22 mmol/L (ref 22–32)
Calcium: 9.2 mg/dL (ref 8.9–10.3)
Chloride: 107 mmol/L (ref 98–111)
Creatinine, Ser: 0.98 mg/dL (ref 0.44–1.00)
GFR calc Af Amer: >60 mL/min (ref >60)
GFR calc non Af Amer: 54 mL/min — ABNORMAL LOW (ref >60)
Glucose, Bld: 129 mg/dL — ABNORMAL HIGH (ref 70–99)
Potassium: 4.5 mmol/L (ref 3.5–5.1)
Sodium: 140 mmol/L (ref 135–145)
Total Bilirubin: 0.5 mg/dL (ref 0.3–1.2)
Total Protein: 7.2 g/dL (ref 6.5–8.1)

## 2020-01-15 LAB — LIPID PANEL
Cholesterol: 197 mg/dL (ref 0–200)
HDL: 43 mg/dL (ref >40)
LDL Cholesterol: 127 mg/dL — ABNORMAL HIGH (ref 0–99)
Total CHOL/HDL Ratio: 4.6 RATIO
Triglycerides: 134 mg/dL (ref <150)
VLDL: 27 mg/dL (ref 0–40)

## 2020-01-15 LAB — CBC
HCT: 41.7 % (ref 36.0–46.0)
Hemoglobin: 13.2 g/dL (ref 12.0–15.0)
MCH: 29.4 pg (ref 26.0–34.0)
MCHC: 31.7 g/dL (ref 30.0–36.0)
MCV: 92.9 fL (ref 80.0–100.0)
Platelets: 234 10*3/uL (ref 150–400)
RBC: 4.49 MIL/uL (ref 3.87–5.11)
RDW: 14.6 % (ref 11.5–15.5)
WBC: 45.9 10*3/uL — ABNORMAL HIGH (ref 4.0–10.5)
nRBC: 0 % (ref 0.0–0.2)

## 2020-01-15 LAB — TSH: TSH: 2.07 u[IU]/mL (ref 0.350–4.500)

## 2020-01-15 LAB — HEMOGLOBIN A1C
Hgb A1c MFr Bld: 6.3 % — ABNORMAL HIGH (ref 4.8–5.6)
Mean Plasma Glucose: 134.11 mg/dL

## 2020-02-02 NOTE — Progress Notes (Deleted)
Cardiology Office Note  Date: 02/02/2020   ID: [name], DOB November 08, 1935, MRN [id number]  PCP:  Lemmie Evens, MD  Cardiologist:  Kate Sable, MD (Inactive) Electrophysiologist:  None   Chief Complaint: Chronic systolic heart failure  History of Present Illness: [name] is a 84 y.o. female with a history of chronic systolic heart failure, PAF, mild aortic stenosis, LBBB, CLL, COPD.  Last encounter with Dr. Harrington Challenger 12/05/2019.  At that time her breathing was stable she was having some left-sided chest pain but only when laying on the left side, not associated with activity.  Occasional palpitations.  Had chronic unsteadiness, dizziness but no syncope.  Her volume status was good since she was staying on the same heart failure regimem.  Occasional palpitations and was continuing Eliquis.  Aortic stenosis was mild by echo and plan to repeat echo in January 2022  Past Medical History:  Diagnosis Date  . Bowel obstruction ([place])   . Breast cancer ([place])   . Breast cancer, stage 3 ([place]) 07/21/2011  . Bronchitis   . CLL (chronic lymphocytic leukemia) ([place]) 07/21/2011  . Migraines   . PAF (paroxysmal atrial fibrillation) ([place])   . Pneumonia     Past Surgical History:  Procedure Laterality Date  . ABDOMINAL HYSTERECTOMY    . APPENDECTOMY    . CHOLECYSTECTOMY    . LAPAROTOMY N/A 06/08/2015   Procedure: EXPLORATORY LAPAROTOMY;  Surgeon: Aviva Signs, MD;  Location: [facility];  Service: General;  Laterality: N/A;  . LYSIS OF ADHESION N/A 06/08/2015   Procedure: LYSIS OF ADHESION;  Surgeon: Aviva Signs, MD;  Location: [facility];  Service: General;  Laterality: N/A;  . MASTECTOMY     right  . TOTAL HIP ARTHROPLASTY     right    Current Outpatient Medications  Medication Sig Dispense Refill  . acetaminophen (TYLENOL) 500 MG tablet Take 500 mg by mouth every 6 (six) hours as needed for mild pain, moderate pain or headache.    . albuterol (PROVENTIL HFA;VENTOLIN HFA) 108  (90 BASE) MCG/ACT inhaler Inhale 2 puffs into the lungs every 6 (six) hours as needed for shortness of breath.     Marland Kitchen albuterol (PROVENTIL) (2.5 MG/3ML) 0.083% nebulizer solution Take 3 mLs (2.5 mg total) by nebulization every 6 (six) hours as needed for wheezing or shortness of breath (when not relieved by inhaler.). 75 mL 12  . apixaban (ELIQUIS) 5 MG TABS tablet Take 1 tablet (5 mg total) by mouth 2 (two) times daily. 180 tablet 1  . bisoprolol (ZEBETA) 5 MG tablet Take 1 tablet (5 mg total) by mouth daily. 90 tablet 1  . cholecalciferol (VITAMIN D) 1000 UNITS tablet Take 1,000 Units by mouth daily.    Marland Kitchen omeprazole (PRILOSEC) 20 MG capsule Take 20 mg by mouth at bedtime.     Marland Kitchen spironolactone (ALDACTONE) 25 MG tablet Take 0.5 tablets (12.5 mg total) by mouth daily. 45 tablet 1   No current facility-administered medications for this visit.   Allergies:  Codeine, Meclizine, and Sulfa antibiotics   Social History: The patient  reports that she quit smoking about 41 years ago. She has never used smokeless tobacco. She reports that she does not drink alcohol and does not use drugs.   Family History: The patient's family history includes Cancer in her brother and another family member.   ROS:  Please see the history of present illness. Otherwise, complete review of systems is positive for {NONE DEFAULTED:18576::"none"}.  All other systems  are reviewed and negative.   Physical Exam: VS:  There were no vitals taken for this visit., BMI There is no height or weight on file to calculate BMI.  Wt Readings from Last 3 Encounters:  12/05/19 179 lb (81.2 kg)  10/08/19 177 lb (80.3 kg)  06/18/19 163 lb (73.9 kg)    General: Patient appears comfortable at rest. HEENT: Conjunctiva and lids normal, oropharynx clear with moist mucosa. Neck: Supple, no elevated JVP or carotid bruits, no thyromegaly. Lungs: Clear to auscultation, nonlabored breathing at rest. Cardiac: Regular rate and rhythm, no S3 or  significant systolic murmur, no pericardial rub. Abdomen: Soft, nontender, no hepatomegaly, bowel sounds present, no guarding or rebound. Extremities: No pitting edema, distal pulses 2+. Skin: Warm and dry. Musculoskeletal: No kyphosis. Neuropsychiatric: Alert and oriented x3, affect grossly appropriate.  ECG:  {EKG/Telemetry Strips Reviewed:[id number]}  Recent Labwork: 03/25/2019: B Natriuretic Peptide 1,067.0 03/27/2019: Magnesium 2.1 01/15/2020: ALT 26; AST 27; BUN 32; Creatinine, Ser 0.98; Hemoglobin 13.2; Platelets 234; Potassium 4.5; Sodium 140; TSH 2.070     Component Value Date/Time   CHOL 197 01/15/2020 1312   TRIG 134 01/15/2020 1312   HDL 43 01/15/2020 1312   CHOLHDL 4.6 01/15/2020 1312   VLDL 27 01/15/2020 1312   LDLCALC 127 (H) 01/15/2020 1312    Other Studies Reviewed Today:   Echocardiogram 03/26/2019  1. Left ventricular ejection fraction, by visual estimation, is 40 to 45%. The left ventricle has mild to moderately decreased function. There is moderately increased left ventricular hypertrophy. 2. Abnormal septal motion consistent with left bundle branch block. 3. Left ventricular diastolic parameters are consistent with Grade II diastolic dysfunction (pseudonormalization). 4. Global right ventricle has normal systolic function.The right ventricular size is normal. No increase in right ventricular wall thickness. 5. Left atrial size was severely dilated. 6. Right atrial size was normal. 7. Presence of pericardial fat pad. 8. Mild to moderate aortic valve annular calcification. 9. Moderate mitral annular calcification. 10. The mitral valve is grossly normal. Mild mitral valve regurgitation. 11. The tricuspid valve is grossly normal. Tricuspid valve regurgitation is mild. 12. Aortic valve mean gradient measures 11.0 mmHg. 13. The aortic valve is tricuspid. Aortic valve regurgitation is not visualized. Mild aortic valve stenosis. 14. The pulmonic valve was  grossly normal. Pulmonic valve regurgitation is trivial. 15. Mildly elevated pulmonary artery systolic pressure. 16. The tricuspid regurgitant velocity is 3.04 m/s, and with an assumed right atrial pressure of 3 mmHg, the estimated right ventricular systolic pressure is mildly elevated at 40.0 mmHg. 17. The inferior vena cava is normal in size with greater than 50% respiratory variability, suggesting right atrial pressure of 3 mmHg. In comparison to the previous echocardiogram(s): Compared to the prior study in February 2020 LVEF has decreased.  Assessment and Plan:  1. Chronic systolic heart failure (HCC)   2. Paroxysmal atrial fibrillation ([place])   3. Aortic valve stenosis, etiology of cardiac valve disease unspecified      Medication Adjustments/Labs and Tests Ordered: Current medicines are reviewed at length with the patient today.  Concerns regarding medicines are outlined above.   Disposition: Follow-up with ***  Signed, Levell July, NP 02/02/2020 10:41 PM    [facility] [address], [place], [place] [address] Phone: [phone]; Fax: [phone]

## 2020-02-03 ENCOUNTER — Ambulatory Visit: Payer: Medicare HMO | Admitting: Family Medicine

## 2020-02-10 NOTE — Progress Notes (Addendum)
Cardiology Office Note  Date: 02/11/2020   ID: [name], DOB [birth year], MRN [id number]  PCP:  Lemmie Evens, MD  Cardiologist:  Dorris Carnes, MD Electrophysiologist:  None   Chief Complaint: Chronic systolic heart failure  History of Present Illness: [name] is a 84 y.o. female with a history of chronic systolic heart failure, PAF, mild aortic stenosis, LBBB, CLL, COPD.  Last encounter with Dr. Harrington Challenger 12/05/2019.  At that time her breathing was stable she was having some left-sided chest pain but only when laying on the left side, not associated with activity.  Occasional palpitations.  Had chronic unsteadiness, dizziness but no syncope.  Her volume status was good since she was staying on the same heart failure regimem.  Occasional palpitations and was continuing Eliquis.  Aortic stenosis was mild by echo and plan to repeat echo in January 2022.  Patient recently saw her primary care provider and seen by Mrs. Lenon Oms, NP recently seen for acute visit for urinary symptoms.  Treated for UTI.  She mention to PCP she had had 2 recent presyncopal episodes.  Also complaining to PCP that her heart was racing at times.  The first episode occurred at her bank where she suddenly felt dizzy and as though she may pass out.  She states she was assisted to a chair inside the bank and rested there for approximately 3 hours before leaving.  She states the second episode occurred later at a store where she had to stand for approximately 45 minutes before she felt able to leave in her car to return home.  She also mentions a third episode when standing in her home where she felt a similar sensation and had to hold onto the counter in her kitchen to keep from passing out.  States she has noticed some palpitations and abnormal heartbeats recently.  Sometimes she feels a fluttering.  She sometimes can feel the sensation in bed at night.  Her initial heart rate on arrival today was 46.  A  subsequent EKG showed a heart rate to be 66 with sinus rhythm left bundle branch block.  She states prior to these 3 recent episode she never had any sensation of presyncope or syncopal episode.  She denies any anginal or exertional symptoms, CVA or TIA-like symptoms, PND or orthopnea.  She states she does have a history of OSA which was diagnosed back in either 2007 or 2008.  States she was given CPAP to wear at that time but could not tolerate wearing the CPAP and has not worn it since.  Her blood pressure is stable today at 118/50.  Of note the patient is prediabetic.  Her recent hemoglobin A1c on 01/15/2020 was 6.3%.  I asked the patient during any of these episodes if she actually checked her blood sugar to see if it may have been low.  She states she did not have her meter with her at the time.  Patient has CLL with recent WBC count of 45.9.  She follows at oncology clinic.  Recent LDL at PCP was 127.  She is not on statin medications.  Given possible diagnosis of diabetes she may need to be started on a statin medication in the future.  Past Medical History:  Diagnosis Date  . Bowel obstruction ([place])   . Breast cancer ([place])   . Breast cancer, stage 3 ([place]) 07/21/2011  . Bronchitis   . CLL (chronic lymphocytic leukemia) ([place]) 07/21/2011  . Migraines   .  PAF (paroxysmal atrial fibrillation) ([place])   . Pneumonia     Past Surgical History:  Procedure Laterality Date  . ABDOMINAL HYSTERECTOMY    . APPENDECTOMY    . CHOLECYSTECTOMY    . LAPAROTOMY N/A 06/08/2015   Procedure: EXPLORATORY LAPAROTOMY;  Surgeon: Aviva Signs, MD;  Location: [facility];  Service: General;  Laterality: N/A;  . LYSIS OF ADHESION N/A 06/08/2015   Procedure: LYSIS OF ADHESION;  Surgeon: Aviva Signs, MD;  Location: [facility];  Service: General;  Laterality: N/A;  . MASTECTOMY     right  . TOTAL HIP ARTHROPLASTY     right    Current Outpatient Medications  Medication Sig Dispense Refill  . acetaminophen (TYLENOL) 500 MG  tablet Take 500 mg by mouth every 6 (six) hours as needed for mild pain, moderate pain or headache.    . albuterol (PROVENTIL HFA;VENTOLIN HFA) 108 (90 BASE) MCG/ACT inhaler Inhale 2 puffs into the lungs every 6 (six) hours as needed for shortness of breath.     Marland Kitchen albuterol (PROVENTIL) (2.5 MG/3ML) 0.083% nebulizer solution Take 3 mLs (2.5 mg total) by nebulization every 6 (six) hours as needed for wheezing or shortness of breath (when not relieved by inhaler.). 75 mL 12  . apixaban (ELIQUIS) 5 MG TABS tablet Take 1 tablet (5 mg total) by mouth 2 (two) times daily. 180 tablet 1  . bisoprolol (ZEBETA) 5 MG tablet Take 1 tablet (5 mg total) by mouth daily. 90 tablet 1  . cholecalciferol (VITAMIN D) 1000 UNITS tablet Take 1,000 Units by mouth daily.    Marland Kitchen omeprazole (PRILOSEC) 20 MG capsule Take 20 mg by mouth at bedtime.     Marland Kitchen spironolactone (ALDACTONE) 25 MG tablet Take 0.5 tablets (12.5 mg total) by mouth daily. 45 tablet 1   No current facility-administered medications for this visit.   Allergies:  Codeine, Meclizine, and Sulfa antibiotics   Social History: The patient  reports that she quit smoking about 41 years ago. She has never used smokeless tobacco. She reports that she does not drink alcohol and does not use drugs.   Family History: The patient's family history includes Cancer in her brother and another family member.   ROS:  Please see the history of present illness. Otherwise, complete review of systems is positive for none.  All other systems are reviewed and negative.   Physical Exam: VS:  BP (!) 118/50   Pulse (!) 46   Ht 5\' 1"  (1.549 m)   Wt 180 lb (81.6 kg)   SpO2 96%   BMI 34.01 kg/m , BMI Body mass index is 34.01 kg/m.  Wt Readings from Last 3 Encounters:  02/11/20 180 lb (81.6 kg)  12/05/19 179 lb (81.2 kg)  10/08/19 177 lb (80.3 kg)    General: Patient appears comfortable at rest. Neck: Supple, no elevated JVP or carotid bruits, no thyromegaly. Lungs: Clear to  auscultation, nonlabored breathing at rest. Cardiac: Regular rate and rhythm, no S3 or significant systolic murmur, no pericardial rub. Extremities: No pitting edema, distal pulses 2+. Skin: Warm and dry. Musculoskeletal: No kyphosis. Neuropsychiatric: Alert and oriented x3, affect grossly appropriate.  ECG:  An ECG dated 02/11/2020 was personally reviewed today and demonstrated:  Sinus rhythm with left bundle branch block rate of 66  Recent Labwork: 03/25/2019: B Natriuretic Peptide 1,067.0 03/27/2019: Magnesium 2.1 01/15/2020: ALT 26; AST 27; BUN 32; Creatinine, Ser 0.98; Hemoglobin 13.2; Platelets 234; Potassium 4.5; Sodium 140; TSH 2.070     Component Value  Date/Time   CHOL 197 01/15/2020 1312   TRIG 134 01/15/2020 1312   HDL 43 01/15/2020 1312   CHOLHDL 4.6 01/15/2020 1312   VLDL 27 01/15/2020 1312   LDLCALC 127 (H) 01/15/2020 1312    Other Studies Reviewed Today:   Echocardiogram 03/26/2019  1. Left ventricular ejection fraction, by visual estimation, is 40 to 45%. The left ventricle has mild to moderately decreased function. There is moderately increased left ventricular hypertrophy. 2. Abnormal septal motion consistent with left bundle branch block. 3. Left ventricular diastolic parameters are consistent with Grade II diastolic dysfunction (pseudonormalization). 4. Global right ventricle has normal systolic function.The right ventricular size is normal. No increase in right ventricular wall thickness. 5. Left atrial size was severely dilated. 6. Right atrial size was normal. 7. Presence of pericardial fat pad. 8. Mild to moderate aortic valve annular calcification. 9. Moderate mitral annular calcification. 10. The mitral valve is grossly normal. Mild mitral valve regurgitation. 11. The tricuspid valve is grossly normal. Tricuspid valve regurgitation is mild. 12. Aortic valve mean gradient measures 11.0 mmHg. 13. The aortic valve is tricuspid. Aortic valve regurgitation  is not visualized. Mild aortic valve stenosis. 14. The pulmonic valve was grossly normal. Pulmonic valve regurgitation is trivial. 15. Mildly elevated pulmonary artery systolic pressure. 16. The tricuspid regurgitant velocity is 3.04 m/s, and with an assumed right atrial pressure of 3 mmHg, the estimated right ventricular systolic pressure is mildly elevated at 40.0 mmHg. 17. The inferior vena cava is normal in size with greater than 50% respiratory variability, suggesting right atrial pressure of 3 mmHg. In comparison to the previous echocardiogram(s): Compared to the prior study in February 2020 LVEF has decreased.  Assessment and Plan:  1. Chronic systolic heart failure (HCC)   2. Paroxysmal atrial fibrillation ([place])   3. Mild aortic stenosis   4. Palpitations   5. Near syncope   6. OSA (obstructive sleep apnea)     1. Chronic systolic heart failure (HCC) History of chronic systolic heart failure with last echo 03/26/2019 showing EF 40 to 45%.  She has noticed significant dyspnea on exertion, weight gain, or increased lower extremity edema.  Continue bisoprolol 5 mg p.o. daily, spironolactone 12.5 mg daily  2. Paroxysmal atrial fibrillation (HCC) History of PAF.  Today's EKG shows sinus rhythm left bundle branch block rate of 66.  Patient states she has felt some recent fluttering in her chest.  She is also had some recent events she describes as near syncopal episodes.  States she was unsure if she was having any abnormal heart rhythms at that time.  Continue bisoprolol 5 mg daily, Eliquis 5 mg p.o. twice daily.  3. Mild aortic stenosis Last echocardiogram on 03/26/2019 showed EF 40 to 45%.  Moderately increased LVH, abnormal septal motion consistent with LBBB, G1 2 DD, LA severely dilated, mild MR, mild TR mild aortic stenosis.  She has an upcoming echo in January 2022 ordered by Dr. Harrington Challenger.  4. Palpitations Describes recent fluttering sensations in her chest.  She does have a history  of paroxysmal atrial fibrillation.  States she is unsure if the palpitations occurred during recent near syncopal episodes.  We will get a 14-day ZIO monitor to check for tachy/bradycardia arrhythmias.  5. Near syncope Patient's had 3 recent episodes of which she describes as near syncope.  Denies any frank syncope.  We are getting a 14-day  ZIO monitor to check for tachy or bradycardia arrhythmias.  6. OSA (obstructive sleep apnea) Initial diagnosis of  OSA in 2007/2008.  Initially placed on CPAP.  Patient states she was not able to tolerate CPAP and has not worn the device since.  Medication Adjustments/Labs and Tests Ordered: Current medicines are reviewed at length with the patient today.  Concerns regarding medicines are outlined above.   Disposition: Follow-up with Dr. Harrington Challenger or APP 6 months  Signed, Levell July, NP 02/11/2020 5:03 PM    [facility] [place], [address] [address] Phone: [phone]; Fax: [phone]

## 2020-02-11 ENCOUNTER — Encounter: Payer: Self-pay | Admitting: Family Medicine

## 2020-02-11 ENCOUNTER — Ambulatory Visit (INDEPENDENT_AMBULATORY_CARE_PROVIDER_SITE_OTHER): Payer: Medicare HMO | Admitting: Family Medicine

## 2020-02-11 VITALS — BP 118/50 | HR 46 | Ht 61.0 in | Wt 180.0 lb

## 2020-02-11 DIAGNOSIS — I480 Paroxysmal atrial fibrillation: Secondary | ICD-10-CM | POA: Diagnosis not present

## 2020-02-11 DIAGNOSIS — I5022 Chronic systolic (congestive) heart failure: Secondary | ICD-10-CM | POA: Diagnosis not present

## 2020-02-11 DIAGNOSIS — I350 Nonrheumatic aortic (valve) stenosis: Secondary | ICD-10-CM

## 2020-02-11 DIAGNOSIS — G4733 Obstructive sleep apnea (adult) (pediatric): Secondary | ICD-10-CM

## 2020-02-11 DIAGNOSIS — R002 Palpitations: Secondary | ICD-10-CM | POA: Diagnosis not present

## 2020-02-11 DIAGNOSIS — R55 Syncope and collapse: Secondary | ICD-10-CM

## 2020-02-11 NOTE — Patient Instructions (Addendum)
Medication Instructions:  Continue all current medications.  Labwork: none  Testing/Procedures:  Your physician has recommended that you wear a 14 day event monitor. Event monitors are medical devices that record the heart's electrical activity. Doctors most often [place] these monitors to diagnose arrhythmias. Arrhythmias are problems with the speed or rhythm of the heartbeat. The monitor is a small, portable device. You can wear one while you do your normal daily activities. This is usually used to diagnose what is causing palpitations/syncope (passing out).  Office will contact with results via phone or letter.    Follow-Up: 4-6 weeks   Any Other Special Instructions Will Be Listed Below (If Applicable).  If you need a refill on your cardiac medications before your next appointment, please call your pharmacy.

## 2020-03-13 ENCOUNTER — Other Ambulatory Visit: Payer: Self-pay | Admitting: *Deleted

## 2020-03-13 DIAGNOSIS — I480 Paroxysmal atrial fibrillation: Secondary | ICD-10-CM

## 2020-03-16 NOTE — Progress Notes (Deleted)
Cardiology Office Note  Date: 03/16/2020   ID: JUDE LINCK, DOB 01-05-1936, MRN [id number]  PCP:  Lemmie Evens, MD  Cardiologist:  Dorris Carnes, MD Electrophysiologist:  None   Chief Complaint: Chronic systolic heart failure  History of Present Illness: [name] is a 84 y.o. female with a history of chronic systolic heart failure, PAF, mild aortic stenosis, LBBB, CLL, COPD.  Last encounter with Dr. Harrington Challenger 12/05/2019.  At that time her breathing was stable she was having some left-sided chest pain but only when laying on the left side, not associated with activity.  Occasional palpitations.  Had chronic unsteadiness, dizziness but no syncope.  Her volume status was good since she was staying on the same heart failure regimem.  Occasional palpitations and was continuing Eliquis.  Aortic stenosis was mild by echo and plan to repeat echo in January 2022.  Patient recently saw her primary care provider and seen by Mrs. Lenon Oms, NP recently seen for acute visit for urinary symptoms.  Treated for UTI.  She mentioned to PCP she had had 2 recent presyncopal episodes.  Also complaining to PCP that her heart was racing at times.  The first episode occurred at her bank where she suddenly felt dizzy and as though she may pass out.  She states she was assisted to a chair inside the bank and rested there for approximately 3 hours before leaving.  She states the second episode occurred later at a store where she had to stand for approximately 45 minutes before she felt able to leave in her car to return home.  She also mentions a third episode when standing in her home where she felt a similar sensation and had to hold onto the counter in her kitchen to keep from passing out.  States she has noticed some palpitations and abnormal heartbeats recently.  Sometimes she feels a fluttering.  She sometimes can feel the sensation in bed at night.  Her initial heart rate on arrival today was 46.  A  subsequent EKG showed a heart rate to be 66 with sinus rhythm left bundle branch block.  She states prior to these 3 recent episode she never had any sensation of presyncope or syncopal episode.  She denies any anginal or exertional symptoms, CVA or TIA-like symptoms, PND or orthopnea.  She states she does have a history of OSA which was diagnosed back in either 2007 or 2008.  States she was given CPAP to wear at that time but could not tolerate wearing the CPAP and has not worn it since.  Her blood pressure is stable today at 118/50.  Of note the patient is prediabetic.  Her recent hemoglobin A1c on 01/15/2020 was 6.3%.  I asked the patient during any of these episodes if she actually checked her blood sugar to see if it may have been low.  She states she did not have her meter with her at the time.  Patient has CLL with recent WBC count of 45.9.  She follows at oncology clinic.  Recent LDL at PCP was 127.  She is not on statin medications.  Given possible diagnosis of diabetes she may need to be started on a statin medication in the future.  Past Medical History:  Diagnosis Date  . Bowel obstruction ([place])   . Breast cancer ([place])   . Breast cancer, stage 3 ([place]) 07/21/2011  . Bronchitis   . CLL (chronic lymphocytic leukemia) ([place]) 07/21/2011  . Migraines   .  PAF (paroxysmal atrial fibrillation) ([place])   . Pneumonia     Past Surgical History:  Procedure Laterality Date  . ABDOMINAL HYSTERECTOMY    . APPENDECTOMY    . CHOLECYSTECTOMY    . LAPAROTOMY N/A 06/08/2015   Procedure: EXPLORATORY LAPAROTOMY;  Surgeon: Aviva Signs, MD;  Location: [facility];  Service: General;  Laterality: N/A;  . LYSIS OF ADHESION N/A 06/08/2015   Procedure: LYSIS OF ADHESION;  Surgeon: Aviva Signs, MD;  Location: [facility];  Service: General;  Laterality: N/A;  . MASTECTOMY     right  . TOTAL HIP ARTHROPLASTY     right    Current Outpatient Medications  Medication Sig Dispense Refill  . acetaminophen (TYLENOL) 500 MG  tablet Take 500 mg by mouth every 6 (six) hours as needed for mild pain, moderate pain or headache.    . albuterol (PROVENTIL HFA;VENTOLIN HFA) 108 (90 BASE) MCG/ACT inhaler Inhale 2 puffs into the lungs every 6 (six) hours as needed for shortness of breath.     Marland Kitchen albuterol (PROVENTIL) (2.5 MG/3ML) 0.083% nebulizer solution Take 3 mLs (2.5 mg total) by nebulization every 6 (six) hours as needed for wheezing or shortness of breath (when not relieved by inhaler.). 75 mL 12  . apixaban (ELIQUIS) 5 MG TABS tablet Take 1 tablet (5 mg total) by mouth 2 (two) times daily. 180 tablet 1  . bisoprolol (ZEBETA) 5 MG tablet Take 1 tablet (5 mg total) by mouth daily. 90 tablet 1  . cholecalciferol (VITAMIN D) 1000 UNITS tablet Take 1,000 Units by mouth daily.    Marland Kitchen omeprazole (PRILOSEC) 20 MG capsule Take 20 mg by mouth at bedtime.     Marland Kitchen spironolactone (ALDACTONE) 25 MG tablet Take 0.5 tablets (12.5 mg total) by mouth daily. 45 tablet 1   No current facility-administered medications for this visit.   Allergies:  Codeine, Meclizine, and Sulfa antibiotics   Social History: The patient  reports that she quit smoking about 41 years ago. She has never used smokeless tobacco. She reports that she does not drink alcohol and does not use drugs.   Family History: The patient's family history includes Cancer in her brother and another family member.   ROS:  Please see the history of present illness. Otherwise, complete review of systems is positive for none.  All other systems are reviewed and negative.   Physical Exam: VS:  There were no vitals taken for this visit., BMI There is no height or weight on file to calculate BMI.  Wt Readings from Last 3 Encounters:  02/11/20 180 lb (81.6 kg)  12/05/19 179 lb (81.2 kg)  10/08/19 177 lb (80.3 kg)    General: Patient appears comfortable at rest. Neck: Supple, no elevated JVP or carotid bruits, no thyromegaly. Lungs: Clear to auscultation, nonlabored breathing at  rest. Cardiac: Regular rate and rhythm, no S3 or significant systolic murmur, no pericardial rub. Extremities: No pitting edema, distal pulses 2+. Skin: Warm and dry. Musculoskeletal: No kyphosis. Neuropsychiatric: Alert and oriented x3, affect grossly appropriate.  ECG:  An ECG dated 02/11/2020 was personally reviewed today and demonstrated:  Sinus rhythm with left bundle branch block rate of 66  Recent Labwork: 03/25/2019: B Natriuretic Peptide 1,067.0 03/27/2019: Magnesium 2.1 01/15/2020: ALT 26; AST 27; BUN 32; Creatinine, Ser 0.98; Hemoglobin 13.2; Platelets 234; Potassium 4.5; Sodium 140; TSH 2.070     Component Value Date/Time   CHOL 197 01/15/2020 1312   TRIG 134 01/15/2020 1312   HDL 43 01/15/2020 1312  CHOLHDL 4.6 01/15/2020 1312   VLDL 27 01/15/2020 1312   LDLCALC 127 (H) 01/15/2020 1312    Other Studies Reviewed Today:   Echocardiogram 03/26/2019  1. Left ventricular ejection fraction, by visual estimation, is 40 to 45%. The left ventricle has mild to moderately decreased function. There is moderately increased left ventricular hypertrophy. 2. Abnormal septal motion consistent with left bundle branch block. 3. Left ventricular diastolic parameters are consistent with Grade II diastolic dysfunction (pseudonormalization). 4. Global right ventricle has normal systolic function.The right ventricular size is normal. No increase in right ventricular wall thickness. 5. Left atrial size was severely dilated. 6. Right atrial size was normal. 7. Presence of pericardial fat pad. 8. Mild to moderate aortic valve annular calcification. 9. Moderate mitral annular calcification. 10. The mitral valve is grossly normal. Mild mitral valve regurgitation. 11. The tricuspid valve is grossly normal. Tricuspid valve regurgitation is mild. 12. Aortic valve mean gradient measures 11.0 mmHg. 13. The aortic valve is tricuspid. Aortic valve regurgitation is not visualized. Mild aortic  valve stenosis. 14. The pulmonic valve was grossly normal. Pulmonic valve regurgitation is trivial. 15. Mildly elevated pulmonary artery systolic pressure. 16. The tricuspid regurgitant velocity is 3.04 m/s, and with an assumed right atrial pressure of 3 mmHg, the estimated right ventricular systolic pressure is mildly elevated at 40.0 mmHg. 17. The inferior vena cava is normal in size with greater than 50% respiratory variability, suggesting right atrial pressure of 3 mmHg. In comparison to the previous echocardiogram(s): Compared to the prior study in February 2020 LVEF has decreased.  Assessment and Plan:    1. Chronic systolic heart failure (HCC) History of chronic systolic heart failure with last echo 03/26/2019 showing EF 40 to 45%.  She has noticed significant dyspnea on exertion, weight gain, or increased lower extremity edema.  Continue bisoprolol 5 mg p.o. daily, spironolactone 12.5 mg daily  2. Paroxysmal atrial fibrillation (HCC) History of PAF.  Today's EKG shows sinus rhythm left bundle branch block rate of 66.  Patient states she has felt some recent fluttering in her chest.  She is also had some recent events she describes as near syncopal episodes.  States she was unsure if she was having any abnormal heart rhythms at that time.  Continue bisoprolol 5 mg daily, Eliquis 5 mg p.o. twice daily.  3. Mild aortic stenosis Last echocardiogram on 03/26/2019 showed EF 40 to 45%.  Moderately increased LVH, abnormal septal motion consistent with LBBB, G1 2 DD, LA severely dilated, mild MR, mild TR mild aortic stenosis.  She has an upcoming echo in January 2022 ordered by Dr. Harrington Challenger.  4. Palpitations Describes recent fluttering sensations in her chest.  She does have a history of paroxysmal atrial fibrillation.  States she is unsure if the palpitations occurred during recent near syncopal episodes.  We will get a 14-day ZIO monitor to check for tachy/bradycardia arrhythmias.  5. Near  syncope Patient's had 3 recent episodes of which she describes as near syncope.  Denies any frank syncope.  We are getting a 14-day  ZIO monitor to check for tachy or bradycardia arrhythmias.  6. OSA (obstructive sleep apnea) Initial diagnosis of OSA in 2007/2008.  Initially placed on CPAP.  Patient states she was not able to tolerate CPAP and has not worn the device since.  Medication Adjustments/Labs and Tests Ordered: Current medicines are reviewed at length with the patient today.  Concerns regarding medicines are outlined above.   Disposition: Follow-up with Dr. Harrington Challenger or APP 6  months  Signed, Levell July, NP 03/16/2020 1:10 PM    [facility] at [place], [place], [place] [address] Phone: [phone]; Fax: [phone]

## 2020-03-17 ENCOUNTER — Ambulatory Visit: Payer: Medicare HMO | Admitting: Family Medicine

## 2020-03-18 NOTE — Progress Notes (Signed)
Cardiology Office Note  Date: 03/19/2020   ID: DASHLEY MONTS, DOB Jul 01, 1935, MRN [id number]  PCP:  Lemmie Evens, MD  Cardiologist:  Dorris Carnes, MD Electrophysiologist:  None   Chief Complaint: Chronic systolic heart failure  History of Present Illness: [name] is a 84 y.o. female with a history of chronic systolic heart failure, PAF, mild aortic stenosis, LBBB, CLL, COPD.  Last encounter with Dr. Harrington Challenger 12/05/2019.  At that time her breathing was stable she was having some left-sided chest pain but only when laying on the left side, not associated with activity.  Occasional palpitations.  Had chronic unsteadiness, dizziness but no syncope.  Her volume status was good since she was staying on the same heart failure regimem.  Occasional palpitations and was continuing Eliquis.  Aortic stenosis was mild by echo and plan to repeat echo in January 2022.  At last visit she mention she had recently seen her primary care provider and by Mrs. Lenon Oms, NP recently seen for acute visit for urinary symptoms.  Treated for UTI.  She mentioned to PCP she had had 2 recent presyncopal episodes.  Also complained to PCP that her heart was racing at times.  The first episode occurred at her bank where she suddenly felt dizzy and as though she may pass out.  She stated she was assisted to a chair inside the bank and rested there for approximately 3 hours before leaving.  She stated the second episode occurred later at a store where she had to stand for approximately 45 minutes before she felt able to leave in her car to return home.  She also mentioned a third episode when standing in her home where she felt a similar sensation and had to hold onto the counter in her kitchen to keep from passing out.  Stated she has noticed some palpitations and abnormal heartbeats recently.  Sometimes she felt a fluttering.  She sometimes could feel the sensation in bed at night.   She stated prior to these  3 recent episode she never had any sensation of presyncope or syncopal episode.  She has a history of OSA which was diagnosed back in either 2007 or 2008.  Stated she was given CPAP to wear at that time but could not tolerate wearing the CPAP and had not worn it since.    Patient is here for follow-up status post recent ZIO monitor results.Cardiac monitor results showed minimum heart rate of 39, maximal heart rate 190, average heart rate 60, predominant underlying rhythm was sinus.  Bundle branch block/IVCD was present.  3 VT runs occurred.  Fastest lasting 7 beats with a rate of 190.  Longest lasting 4 beats with average rate of 128.  She had 43 supraventricular tachycardia runs.  Fastest lasting 14 minutes with a rate of 164, longer lasting 19 beats with a 127 rate.  Atrial fibrillation with 3% burden ranging from 59-137.  Longest lasting episode 9 hours and 23 minutes.  I reviewed the results of the monitor with the patient.  Patient complains today of some intermittent stabbing-like chest pain under her left breast without radiation or associated nausea, vomiting, diaphoresis.  She denies any lightheadedness dizziness, presyncopal or syncopal episodes, CVA or TIA-like symptoms, bleeding issues.  Past Medical History:  Diagnosis Date  . Bowel obstruction ([place])   . Breast cancer ([place])   . Breast cancer, stage 3 ([place]) 07/21/2011  . Bronchitis   . CLL (chronic lymphocytic leukemia) ([place]) 07/21/2011  .  Migraines   . PAF (paroxysmal atrial fibrillation) ([place])   . Pneumonia     Past Surgical History:  Procedure Laterality Date  . ABDOMINAL HYSTERECTOMY    . APPENDECTOMY    . CHOLECYSTECTOMY    . LAPAROTOMY N/A 06/08/2015   Procedure: EXPLORATORY LAPAROTOMY;  Surgeon: Aviva Signs, MD;  Location: [facility];  Service: General;  Laterality: N/A;  . LYSIS OF ADHESION N/A 06/08/2015   Procedure: LYSIS OF ADHESION;  Surgeon: Aviva Signs, MD;  Location: [facility];  Service: General;  Laterality: N/A;  .  MASTECTOMY     right  . TOTAL HIP ARTHROPLASTY     right    Current Outpatient Medications  Medication Sig Dispense Refill  . acetaminophen (TYLENOL) 500 MG tablet Take 500 mg by mouth every 6 (six) hours as needed for mild pain, moderate pain or headache.    . albuterol (PROVENTIL HFA;VENTOLIN HFA) 108 (90 BASE) MCG/ACT inhaler Inhale 2 puffs into the lungs every 6 (six) hours as needed for shortness of breath.     Marland Kitchen albuterol (PROVENTIL) (2.5 MG/3ML) 0.083% nebulizer solution Take 3 mLs (2.5 mg total) by nebulization every 6 (six) hours as needed for wheezing or shortness of breath (when not relieved by inhaler.). 75 mL 12  . apixaban (ELIQUIS) 5 MG TABS tablet Take 1 tablet (5 mg total) by mouth 2 (two) times daily. 180 tablet 1  . bisoprolol (ZEBETA) 5 MG tablet Take 1 tablet (5 mg total) by mouth daily. 90 tablet 1  . cholecalciferol (VITAMIN D) 1000 UNITS tablet Take 1,000 Units by mouth daily.    Marland Kitchen omeprazole (PRILOSEC) 20 MG capsule Take 20 mg by mouth at bedtime.     Marland Kitchen spironolactone (ALDACTONE) 25 MG tablet Take 0.5 tablets (12.5 mg total) by mouth daily. 45 tablet 1   No current facility-administered medications for this visit.   Allergies:  Codeine, Meclizine, and Sulfa antibiotics   Social History: The patient  reports that she quit smoking about 41 years ago. She has never used smokeless tobacco. She reports that she does not drink alcohol and does not use drugs.   Family History: The patient's family history includes Cancer in her brother and another family member.   ROS:  Please see the history of present illness. Otherwise, complete review of systems is positive for none.  All other systems are reviewed and negative.   Physical Exam: VS:  BP 116/80   Pulse 70   Ht 5\' 1"  (1.549 m)   Wt 178 lb 9.6 oz (81 kg)   SpO2 98%   BMI 33.75 kg/m , BMI Body mass index is 33.75 kg/m.  Wt Readings from Last 3 Encounters:  03/19/20 178 lb 9.6 oz (81 kg)  02/11/20 180 lb (81.6  kg)  12/05/19 179 lb (81.2 kg)    General: Patient appears comfortable at rest. Neck: Supple, no elevated JVP or carotid bruits, no thyromegaly. Lungs: Clear to auscultation, nonlabored breathing at rest. Cardiac: Regular rate and rhythm, no S3 or significant systolic murmur, no pericardial rub. Extremities: No pitting edema, distal pulses 2+. Skin: Warm and dry. Musculoskeletal: No kyphosis. Neuropsychiatric: Alert and oriented x3, affect grossly appropriate.  ECG:  An ECG dated 02/11/2020 was personally reviewed today and demonstrated:  Sinus rhythm with left bundle branch block rate of 66  Recent Labwork: 03/25/2019: B Natriuretic Peptide 1,067.0 03/27/2019: Magnesium 2.1 01/15/2020: ALT 26; AST 27; BUN 32; Creatinine, Ser 0.98; Hemoglobin 13.2; Platelets 234; Potassium 4.5; Sodium 140; TSH 2.070  Component Value Date/Time   CHOL 197 01/15/2020 1312   TRIG 134 01/15/2020 1312   HDL 43 01/15/2020 1312   CHOLHDL 4.6 01/15/2020 1312   VLDL 27 01/15/2020 1312   LDLCALC 127 (H) 01/15/2020 1312    Other Studies Reviewed Today:  Cardiac monitor 03/13/2020  Preliminary Findings Patient had a min HR of 39 bpm, max HR of 190 bpm, and avg HR of 60 bpm. Predominant underlying rhythm was Sinus Rhythm. Bundle Branch Block/IVCD was present. 3 Ventricular Tachycardia runs occurred, the run with the fastest interval lasting 7 beats with a max rate of 190 bpm, the longest lasting 4 beats with an avg rate of 128 bpm. 43 Supraventricular Tachycardia runs occurred, the run with the fastest interval lasting 14 beats with a max rate of 164 bpm, the longest lasting 19 beats with an avg rate of 127 bpm. Atrial Fibrillation occurred (3% burden), ranging from 59-137 bpm (avg of 83 bpm), the longest lasting 9 hours 23 mins with an avg rate of 83 bpm. Atrial Fibrillation was present at de-activation of device. Ventricular Tachycardia, Supraventricular Tachycardia and Atrial Fibrillation were  detected within +/- 45 seconds of symptomatic patient event(s). Isolated SVEs were frequent (6.3%, [id number]), SVE Couplets were rare (<1.0%, 481), and SVE Triplets were rare (<1.0%, 64). Isolated VEs were occasional (1.5%, 16490), VE Couplets were rare (<1.0%, 628), and no VE Triplets were present. Ventricular Bigeminy was present. Some VEs may be SVEs with possible aberrancy.   Echocardiogram 03/26/2019  1. Left ventricular ejection fraction, by visual estimation, is 40 to 45%. The left ventricle has mild to moderately decreased function. There is moderately increased left ventricular hypertrophy. 2. Abnormal septal motion consistent with left bundle branch block. 3. Left ventricular diastolic parameters are consistent with Grade II diastolic dysfunction (pseudonormalization). 4. Global right ventricle has normal systolic function.The right ventricular size is normal. No increase in right ventricular wall thickness. 5. Left atrial size was severely dilated. 6. Right atrial size was normal. 7. Presence of pericardial fat pad. 8. Mild to moderate aortic valve annular calcification. 9. Moderate mitral annular calcification. 10. The mitral valve is grossly normal. Mild mitral valve regurgitation. 11. The tricuspid valve is grossly normal. Tricuspid valve regurgitation is mild. 12. Aortic valve mean gradient measures 11.0 mmHg. 13. The aortic valve is tricuspid. Aortic valve regurgitation is not visualized. Mild aortic valve stenosis. 14. The pulmonic valve was grossly normal. Pulmonic valve regurgitation is trivial. 15. Mildly elevated pulmonary artery systolic pressure. 16. The tricuspid regurgitant velocity is 3.04 m/s, and with an assumed right atrial pressure of 3 mmHg, the estimated right ventricular systolic pressure is mildly elevated at 40.0 mmHg. 17. The inferior vena cava is normal in size with greater than 50% respiratory variability, suggesting right atrial pressure of 3  mmHg. In comparison to the previous echocardiogram(s): Compared to the prior study in February 2020 LVEF has decreased.  Assessment and Plan:    1. Chronic systolic heart failure (HCC) History of chronic systolic heart failure with last echo 03/26/2019 showing EF 40 to 45%.  She has no significant dyspnea on exertion, weight gain, or increased lower extremity edema.  Continue bisoprolol 5 mg p.o. daily, spironolactone 12.5 mg daily  2. Paroxysmal atrial fibrillation (HCC) History of PAF.    She recently had some events she described as near syncopal episodes.  States she was unsure if she was having any abnormal heart rhythms at that time.  Recently had a 14-day ZIO monitor.  See results above.  Continue bisoprolol 5 mg daily, Eliquis 5 mg p.o. twice daily.  Start Cardizem 30 mg p.o. twice daily.  Refer to atrial fibrillation clinic.  3. Mild aortic stenosis Last echocardiogram on 03/26/2019 showed EF 40 to 45%.  Moderately increased LVH, abnormal septal motion consistent with LBBB, G1 2 DD, LA severely dilated, mild MR, mild TR mild aortic stenosis.  She has an upcoming echo in January 2022 ordered by Dr. Harrington Challenger.  4. Palpitations Describes recent fluttering sensations in her chest.  She does have a history of paroxysmal atrial fibrillation.  States she is unsure if the palpitations occurred during recent near syncopal episodes.  Recent episodes of SVT, 3% atrial fibrillation burden, 3 episodes of VT on ZIO monitor.  We are referring to atrial fibrillation clinic for further evaluation.  5. Near syncope Patient's had 3 recent episodes of which she described as near syncope at last visit.  Denied any frank syncope.  Recent ZIO monitor with episodes of SVT approximately 43 episodes, 3% atrial fibrillation burden, 3 episodes of VT on preliminary findings.  Denies any recent near syncopal episodes since last visit.  We are referring to atrial fibrillation clinic for further evaluation.  6. OSA  (obstructive sleep apnea) Initial diagnosis of OSA in 2007/2008.  Initially placed on CPAP.  Patient states she was not able to tolerate CPAP and has not worn the device since.  Medication Adjustments/Labs and Tests Ordered: Current medicines are reviewed at length with the patient today.  Concerns regarding medicines are outlined above.   Disposition: Follow-up with Dr. Harrington Challenger or APP 3 months  Signed, Levell July, NP 03/19/2020 2:26 PM    [facility], [place], [place] [address] Phone: [phone]; Fax: [phone]

## 2020-03-19 ENCOUNTER — Encounter: Payer: Self-pay | Admitting: Family Medicine

## 2020-03-19 ENCOUNTER — Ambulatory Visit (INDEPENDENT_AMBULATORY_CARE_PROVIDER_SITE_OTHER): Payer: Medicare HMO | Admitting: Family Medicine

## 2020-03-19 VITALS — BP 116/80 | HR 70 | Ht 61.0 in | Wt 178.6 lb

## 2020-03-19 DIAGNOSIS — R002 Palpitations: Secondary | ICD-10-CM | POA: Diagnosis not present

## 2020-03-19 DIAGNOSIS — I350 Nonrheumatic aortic (valve) stenosis: Secondary | ICD-10-CM | POA: Diagnosis not present

## 2020-03-19 DIAGNOSIS — I480 Paroxysmal atrial fibrillation: Secondary | ICD-10-CM

## 2020-03-19 DIAGNOSIS — I5022 Chronic systolic (congestive) heart failure: Secondary | ICD-10-CM | POA: Diagnosis not present

## 2020-03-19 MED ORDER — DILTIAZEM HCL 30 MG PO TABS: 30.0000 mg | ORAL_TABLET | Freq: Two times a day (BID) | ORAL | 2 refills | Status: DC

## 2020-03-19 NOTE — Patient Instructions (Addendum)
Medication Instructions:   Your physician has recommended you make the following change in your medication:   Start diltiazem 30 mg by mouth twice daily  Continue other medications the same  Labwork:  None  Testing/Procedures:  None  Follow-Up:  Your physician recommends that you schedule a follow-up appointment in: 3 months  Refer to A-Fib Clinic.  Any Other Special Instructions Will Be Listed Below (If Applicable).  If you need a refill on your cardiac medications before your next appointment, please call your pharmacy.

## 2020-03-23 ENCOUNTER — Other Ambulatory Visit: Payer: Self-pay

## 2020-03-23 ENCOUNTER — Ambulatory Visit (HOSPITAL_COMMUNITY)
Admission: RE | Admit: 2020-03-23 | Discharge: 2020-03-23 | Disposition: A | Discharge status: Home / Self Care | Payer: Medicare HMO | Source: Ambulatory Visit | Attending: Physician Assistant | Admitting: Physician Assistant

## 2020-03-23 ENCOUNTER — Encounter (HOSPITAL_COMMUNITY): Payer: Self-pay | Admitting: Physician Assistant

## 2020-03-23 VITALS — BP 114/70 | HR 64 | Ht 61.0 in | Wt 179.8 lb

## 2020-03-23 DIAGNOSIS — Z87891 Personal history of nicotine dependence: Secondary | ICD-10-CM | POA: Diagnosis not present

## 2020-03-23 DIAGNOSIS — G4733 Obstructive sleep apnea (adult) (pediatric): Secondary | ICD-10-CM | POA: Diagnosis not present

## 2020-03-23 DIAGNOSIS — I480 Paroxysmal atrial fibrillation: Secondary | ICD-10-CM

## 2020-03-23 DIAGNOSIS — I5022 Chronic systolic (congestive) heart failure: Secondary | ICD-10-CM | POA: Insufficient documentation

## 2020-03-23 DIAGNOSIS — Z888 Allergy status to other drugs, medicaments and biological substances status: Secondary | ICD-10-CM | POA: Diagnosis not present

## 2020-03-23 DIAGNOSIS — I471 Supraventricular tachycardia: Secondary | ICD-10-CM | POA: Insufficient documentation

## 2020-03-23 DIAGNOSIS — Z6833 Body mass index (BMI) 33.0-33.9, adult: Secondary | ICD-10-CM | POA: Insufficient documentation

## 2020-03-23 DIAGNOSIS — D6869 Other thrombophilia: Secondary | ICD-10-CM | POA: Diagnosis not present

## 2020-03-23 DIAGNOSIS — Z885 Allergy status to narcotic agent status: Secondary | ICD-10-CM | POA: Insufficient documentation

## 2020-03-23 DIAGNOSIS — Z9011 Acquired absence of right breast and nipple: Secondary | ICD-10-CM | POA: Diagnosis not present

## 2020-03-23 DIAGNOSIS — E669 Obesity, unspecified: Secondary | ICD-10-CM | POA: Diagnosis not present

## 2020-03-23 DIAGNOSIS — Z882 Allergy status to sulfonamides status: Secondary | ICD-10-CM | POA: Insufficient documentation

## 2020-03-23 DIAGNOSIS — Z96641 Presence of right artificial hip joint: Secondary | ICD-10-CM | POA: Diagnosis not present

## 2020-03-23 MED ORDER — BISOPROLOL FUMARATE 5 MG PO TABS: ORAL_TABLET | ORAL | Status: DC

## 2020-03-23 NOTE — Progress Notes (Signed)
Primary Care Physician: Lemmie Evens, MD Primary Cardiologist: Dr Harrington Challenger Primary Electrophysiologist: none Referring Physician: Moishe Spice is a 84 y.o. female with a history of mild AS, OSA, systolic CHF (LVEF 40 to 93% in 03/2019), LBBB, CLL, COPD, and  who presents for consultation in the [facility].  Patient is on Eliquis for a CHADS2VASC score of 4. Patient had recently been having palpitations and presyncopal episodes. A 14 day Zio monitor showed 3% afib burden and also 43 episodes of SVT. She was started on diltiazem. Since then, she has not had further presyncopal episodes. However, she did have an episode of chest pain while laying on her left side as well as palpitations occurring around 3 am on 03/22/20. This resolved within a few minutes. She denies any bleeding issues on anticoagulation.   Today, she denies symptoms of shortness of breath, orthopnea, PND, lower extremity edema, dizziness, presyncope, syncope, bleeding, or neurologic sequela. The patient is tolerating medications without difficulties and is otherwise without complaint today.    Atrial Fibrillation Risk Factors:  she does have symptoms or diagnosis of sleep apnea. she is not compliant with CPAP therapy. she does not have a history of rheumatic fever. she does not have a history of alcohol use. The patient does have a history of early familial atrial fibrillation or other arrhythmias. Brother has PPM.  she has a BMI of Body mass index is 33.97 kg/m.Marland Kitchen Filed Weights   03/23/20 1009  Weight: 81.6 kg    Family History  Problem Relation Age of Onset  . Cancer Brother   . Cancer Other      Atrial Fibrillation Management history:  Previous antiarrhythmic drugs: none Previous cardioversions: none Previous ablations: none CHADS2VASC score: 4 Anticoagulation history: Eliquis    Past Medical History:  Diagnosis Date  . Bowel obstruction ([place])   .  Breast cancer ([place])   . Breast cancer, stage 3 ([place]) 07/21/2011  . Bronchitis   . CLL (chronic lymphocytic leukemia) ([place]) 07/21/2011  . Migraines   . PAF (paroxysmal atrial fibrillation) ([place])   . Pneumonia    Past Surgical History:  Procedure Laterality Date  . ABDOMINAL HYSTERECTOMY    . APPENDECTOMY    . CHOLECYSTECTOMY    . LAPAROTOMY N/A 06/08/2015   Procedure: EXPLORATORY LAPAROTOMY;  Surgeon: Aviva Signs, MD;  Location: [facility];  Service: General;  Laterality: N/A;  . LYSIS OF ADHESION N/A 06/08/2015   Procedure: LYSIS OF ADHESION;  Surgeon: Aviva Signs, MD;  Location: [facility];  Service: General;  Laterality: N/A;  . MASTECTOMY     right  . TOTAL HIP ARTHROPLASTY     right    Current Outpatient Medications  Medication Sig Dispense Refill  . acetaminophen (TYLENOL) 500 MG tablet Take 500 mg by mouth every 6 (six) hours as needed for mild pain, moderate pain or headache.    . albuterol (PROVENTIL HFA;VENTOLIN HFA) 108 (90 BASE) MCG/ACT inhaler Inhale 2 puffs into the lungs every 6 (six) hours as needed for shortness of breath.     Marland Kitchen albuterol (PROVENTIL) (2.5 MG/3ML) 0.083% nebulizer solution Take 3 mLs (2.5 mg total) by nebulization every 6 (six) hours as needed for wheezing or shortness of breath (when not relieved by inhaler.). 75 mL 12  . apixaban (ELIQUIS) 5 MG TABS tablet Take 1 tablet (5 mg total) by mouth 2 (two) times daily. 180 tablet 1  . bisoprolol (ZEBETA) 5 MG tablet Take 7.53m  by mouth once daily    . Blood Glucose Monitoring Suppl ([employer] FLEX SYSTEM) w/Device KIT     . cholecalciferol (VITAMIN D) 1000 UNITS tablet Take 1,000 Units by mouth daily.    Marland Kitchen diltiazem (CARDIZEM) 30 MG tablet Take 1 tablet (30 mg total) by mouth 2 (two) times daily. 60 tablet 2  . Lancets (ONETOUCH DELICA PLUS [id number]) MISC 2 (two) times daily.    Marland Kitchen omeprazole (PRILOSEC) 20 MG capsule Take 20 mg by mouth at bedtime.     Glory Rosebush VERIO test strip 2 (two) times daily.    Marland Kitchen  spironolactone (ALDACTONE) 25 MG tablet Take 0.5 tablets (12.5 mg total) by mouth daily. 45 tablet 1   No current facility-administered medications for this encounter.    Allergies  Allergen Reactions  . Codeine Nausea And Vomiting    headache  . Meclizine Other (See Comments)    Made dizziness worse.  . Sulfa Antibiotics Nausea And Vomiting    Stomach upset    Social History   Socioeconomic History  . Marital status: Widowed    Spouse name: Not on file  . Number of children: Not on file  . Years of education: 62  . Highest education level: Not on file  Occupational History  . Not on file  Tobacco Use  . Smoking status: Former Smoker    Quit date: 10/26/1978    Years since quitting: 41.4  . Smokeless tobacco: Never Used  Vaping Use  . Vaping Use: Never used  Substance and Sexual Activity  . Alcohol use: No  . Drug use: No  . Sexual activity: Not Currently  Other Topics Concern  . Not on file  Social History Narrative   Pt lives by herself   Social Determinants of Health   Financial Resource Strain:   . Difficulty of Paying Living Expenses: Not on file  Food Insecurity:   . Worried About Charity fundraiser in the Last Year: Not on file  . Ran Out of Food in the Last Year: Not on file  Transportation Needs:   . Lack of Transportation (Medical): Not on file  . Lack of Transportation (Non-Medical): Not on file  Physical Activity:   . Days of Exercise per Week: Not on file  . Minutes of Exercise per Session: Not on file  Stress:   . Feeling of Stress : Not on file  Social Connections:   . Frequency of Communication with Friends and Family: Not on file  . Frequency of Social Gatherings with Friends and Family: Not on file  . Attends Religious Services: Not on file  . Active Member of Clubs or Organizations: Not on file  . Attends Archivist Meetings: Not on file  . Marital Status: Not on file  Intimate Partner Violence:   . Fear of Current or  Ex-Partner: Not on file  . Emotionally Abused: Not on file  . Physically Abused: Not on file  . Sexually Abused: Not on file     ROS- All systems are reviewed and negative except as per the HPI above.  Physical Exam: Vitals:   03/23/20 1009  BP: 114/70  Pulse: 64  Weight: 81.6 kg  Height: _0  (1.549 m)    GEN- The patient is well appearing obese elderly female, alert and oriented x 3 today.   Head- normocephalic, atraumatic Eyes-  Sclera clear, conjunctiva pink Ears- hearing intact Oropharynx- clear Neck- supple  Lungs- Clear to ausculation bilaterally, normal work  of breathing Heart- Regular rate and rhythm, no murmurs, rubs or gallops  GI- soft, NT, ND, + BS Extremities- no clubbing, cyanosis, or edema MS- no significant deformity or atrophy Skin- no rash or lesion Psych- euthymic mood, full affect Neuro- strength and sensation are intact  Wt Readings from Last 3 Encounters:  03/23/20 81.6 kg  03/19/20 81 kg  02/11/20 81.6 kg    EKG today demonstrates SR HR 64, PVC, PAC, LBBB, PR 192, QRS 136, QTc 491  Echo 03/26/19 demonstrated  1. Left ventricular ejection fraction, by visual estimation, is 40 to  45%. The left ventricle has mild to moderately decreased function. There  is moderately increased left ventricular hypertrophy.  2. Abnormal septal motion consistent with left bundle branch block.  3. Left ventricular diastolic parameters are consistent with Grade II  diastolic dysfunction (pseudonormalization).  4. Global right ventricle has normal systolic function.The right  ventricular size is normal. No increase in right ventricular wall  thickness.  5. Left atrial size was severely dilated.  6. Right atrial size was normal.  7. Presence of pericardial fat pad.  8. Mild to moderate aortic valve annular calcification.  9. Moderate mitral annular calcification.  10. The mitral valve is grossly normal. Mild mitral valve regurgitation.  11. The  tricuspid valve is grossly normal. Tricuspid valve regurgitation  is mild.  12. Aortic valve mean gradient measures 11.0 mmHg.  13. The aortic valve is tricuspid. Aortic valve regurgitation is not  visualized. Mild aortic valve stenosis.  14. The pulmonic valve was grossly normal. Pulmonic valve regurgitation is  trivial.  15. Mildly elevated pulmonary artery systolic pressure.  16. The tricuspid regurgitant velocity is 3.04 m/s, and with an assumed  right atrial pressure of 3 mmHg, the estimated right ventricular systolic  pressure is mildly elevated at 40.0 mmHg.  17. The inferior vena cava is normal in size with greater than 50%  respiratory variability, suggesting right atrial pressure of 3 mmHg.   In comparison to the previous echocardiogram(s): Compared to the prior  study in February 2020 LVEF has decreased.  Epic records are reviewed at length today  CHA2DS2-VASc Score = 4  The patient's score is based upon: CHF History: 1 HTN History: 0 Diabetes History: 0 Stroke History: 0 Vascular Disease History: 0 Age Score: 2 Gender Score: 1      ASSESSMENT AND PLAN: 1. Paroxysmal Atrial Fibrillation/SVT The patient's CHA2DS2-VASc score is 4, indicating a 4.8% annual risk of stroke.   Patient feels her symptoms have improved since starting diltiazem. Will increase bisoprolol to 7.5 mg daily We also discussed AAD therapy if her afib should remain persistent. She has actually done fairly well with her afib given her severely dilated LA and untreated OSA. Continue Eliquis 5 mg BID Continue diltiazem 30 mg BID  2. Secondary Hypercoagulable State (ICD10:  D68.69) The patient is at significant risk for stroke/thromboembolism based upon her CHA2DS2-VASc Score of 4.  Continue Apixaban (Eliquis).   3. Obesity Body mass index is 33.97 kg/m. Lifestyle modification was discussed at length including regular exercise and weight reduction.  4. Obstructive sleep apnea Patient has not  been on CPAP therapy for many years.   5. Chronic systolic CHF No signs or symptoms of fluid overload today.   Follow up in the AF clinic in 2 weeks.    [facility] [address] [place], [place] [address] [phone] 03/23/2020 1:26 PM

## 2020-03-23 NOTE — Patient Instructions (Signed)
Increase Bisoprolol 7.5mg  by mouth daily

## 2020-04-02 ENCOUNTER — Other Ambulatory Visit (HOSPITAL_COMMUNITY): Payer: Medicare HMO

## 2020-04-02 ENCOUNTER — Other Ambulatory Visit: Payer: Self-pay

## 2020-04-02 ENCOUNTER — Inpatient Hospital Stay (HOSPITAL_COMMUNITY): Payer: Medicare HMO | Attending: Hematology

## 2020-04-02 DIAGNOSIS — Z87891 Personal history of nicotine dependence: Secondary | ICD-10-CM | POA: Insufficient documentation

## 2020-04-02 DIAGNOSIS — Z809 Family history of malignant neoplasm, unspecified: Secondary | ICD-10-CM | POA: Diagnosis not present

## 2020-04-02 DIAGNOSIS — Z79899 Other long term (current) drug therapy: Secondary | ICD-10-CM | POA: Diagnosis not present

## 2020-04-02 DIAGNOSIS — Z7901 Long term (current) use of anticoagulants: Secondary | ICD-10-CM | POA: Diagnosis not present

## 2020-04-02 DIAGNOSIS — I4891 Unspecified atrial fibrillation: Secondary | ICD-10-CM | POA: Insufficient documentation

## 2020-04-02 DIAGNOSIS — Z9011 Acquired absence of right breast and nipple: Secondary | ICD-10-CM | POA: Diagnosis not present

## 2020-04-02 DIAGNOSIS — Z96651 Presence of right artificial knee joint: Secondary | ICD-10-CM | POA: Insufficient documentation

## 2020-04-02 DIAGNOSIS — E559 Vitamin D deficiency, unspecified: Secondary | ICD-10-CM | POA: Diagnosis not present

## 2020-04-02 DIAGNOSIS — N951 Menopausal and female climacteric states: Secondary | ICD-10-CM | POA: Insufficient documentation

## 2020-04-02 DIAGNOSIS — Z90710 Acquired absence of both cervix and uterus: Secondary | ICD-10-CM | POA: Insufficient documentation

## 2020-04-02 DIAGNOSIS — C9110 Chronic lymphocytic leukemia of B-cell type not having achieved remission: Secondary | ICD-10-CM | POA: Insufficient documentation

## 2020-04-02 DIAGNOSIS — Z853 Personal history of malignant neoplasm of breast: Secondary | ICD-10-CM | POA: Diagnosis not present

## 2020-04-02 DIAGNOSIS — C50911 Malignant neoplasm of unspecified site of right female breast: Secondary | ICD-10-CM

## 2020-04-02 LAB — CBC WITH DIFFERENTIAL/PLATELET
Basophils Absolute: 0 10*3/uL (ref 0.0–0.1)
Basophils Relative: 0 %
Eosinophils Absolute: 0 10*3/uL (ref 0.0–0.5)
Eosinophils Relative: 0 %
HCT: 41.7 % (ref 36.0–46.0)
Hemoglobin: 13.2 g/dL (ref 12.0–15.0)
Lymphocytes Relative: 72 %
Lymphs Abs: 29.2 10*3/uL — ABNORMAL HIGH (ref 0.7–4.0)
MCH: 29.9 pg (ref 26.0–34.0)
MCHC: 31.7 g/dL (ref 30.0–36.0)
MCV: 94.3 fL (ref 80.0–100.0)
Monocytes Absolute: 2.4 10*3/uL — ABNORMAL HIGH (ref 0.1–1.0)
Monocytes Relative: 6 %
Neutro Abs: 6.5 10*3/uL (ref 1.7–7.7)
Neutrophils Relative %: 16 %
Other: 6 %
Platelets: 237 10*3/uL (ref 150–400)
RBC: 4.42 MIL/uL (ref 3.87–5.11)
RDW: 14.3 % (ref 11.5–15.5)
WBC: 40.6 10*3/uL — ABNORMAL HIGH (ref 4.0–10.5)
nRBC: 0 % (ref 0.0–0.2)

## 2020-04-02 LAB — COMPREHENSIVE METABOLIC PANEL
ALT: 29 U/L (ref 0–44)
AST: 28 U/L (ref 15–41)
Albumin: 4.2 g/dL (ref 3.5–5.0)
Alkaline Phosphatase: 47 U/L (ref 38–126)
Anion gap: 11 (ref 5–15)
BUN: 25 mg/dL — ABNORMAL HIGH (ref 8–23)
CO2: 21 mmol/L — ABNORMAL LOW (ref 22–32)
Calcium: 9.1 mg/dL (ref 8.9–10.3)
Chloride: 106 mmol/L (ref 98–111)
Creatinine, Ser: 0.96 mg/dL (ref 0.44–1.00)
GFR, Estimated: 59 mL/min — ABNORMAL LOW (ref >60)
Glucose, Bld: 111 mg/dL — ABNORMAL HIGH (ref 70–99)
Potassium: 4 mmol/L (ref 3.5–5.1)
Sodium: 138 mmol/L (ref 135–145)
Total Bilirubin: 0.7 mg/dL (ref 0.3–1.2)
Total Protein: 6.8 g/dL (ref 6.5–8.1)

## 2020-04-02 LAB — VITAMIN D 25 HYDROXY (VIT D DEFICIENCY, FRACTURES): Vit D, 25-Hydroxy: 21.17 ng/mL — ABNORMAL LOW (ref 30–100)

## 2020-04-02 LAB — VITAMIN B12: Vitamin B-12: 309 pg/mL (ref 180–914)

## 2020-04-02 LAB — FOLATE: Folate: 9.2 ng/mL (ref >5.9)

## 2020-04-02 LAB — LACTATE DEHYDROGENASE: LDH: 138 U/L (ref 98–192)

## 2020-04-03 LAB — PATHOLOGIST SMEAR REVIEW

## 2020-04-06 ENCOUNTER — Ambulatory Visit (HOSPITAL_COMMUNITY)
Admission: RE | Admit: 2020-04-06 | Discharge: 2020-04-06 | Disposition: A | Discharge status: Home / Self Care | Payer: Medicare HMO | Source: Ambulatory Visit | Attending: Internal Medicine | Admitting: Internal Medicine

## 2020-04-06 ENCOUNTER — Other Ambulatory Visit: Payer: Self-pay

## 2020-04-06 DIAGNOSIS — Z1231 Encounter for screening mammogram for malignant neoplasm of breast: Secondary | ICD-10-CM | POA: Insufficient documentation

## 2020-04-07 ENCOUNTER — Inpatient Hospital Stay (HOSPITAL_COMMUNITY): Admission: RE | Admit: 2020-04-07 | Payer: Medicare HMO | Source: Ambulatory Visit | Admitting: Physician Assistant

## 2020-04-07 ENCOUNTER — Other Ambulatory Visit: Payer: Self-pay | Admitting: Physician Assistant

## 2020-04-09 ENCOUNTER — Other Ambulatory Visit: Payer: Self-pay

## 2020-04-09 ENCOUNTER — Inpatient Hospital Stay (HOSPITAL_COMMUNITY): Payer: Medicare HMO | Admitting: Oncology

## 2020-04-09 ENCOUNTER — Ambulatory Visit (HOSPITAL_COMMUNITY): Payer: Medicare HMO | Admitting: Hematology

## 2020-04-09 VITALS — BP 113/92 | HR 70 | Temp 97.0°F | Resp 20 | Wt 180.5 lb

## 2020-04-09 DIAGNOSIS — C9110 Chronic lymphocytic leukemia of B-cell type not having achieved remission: Secondary | ICD-10-CM | POA: Diagnosis not present

## 2020-04-09 DIAGNOSIS — E559 Vitamin D deficiency, unspecified: Secondary | ICD-10-CM | POA: Diagnosis not present

## 2020-04-09 MED ORDER — ERGOCALCIFEROL 1.25 MG (50000 UT) PO CAPS: 50000.0000 [IU] | ORAL_CAPSULE | ORAL | 1 refills | Status: DC

## 2020-04-09 NOTE — Progress Notes (Signed)
[name], [place] [address]   CLINIC:  Medical Oncology/Hematology  PCP:  Veronica Evens, MD [place] [address] [phone]   REASON FOR VISIT: Follow-up for CLL   CURRENT THERAPY: Clinical surveillance   INTERVAL HISTORY:  [name] 84 y.o. female returns for routine follow-up for CLL.  She reports doing fairly well since her last visit.  She continues Eliquis for her atrial fibrillation.  Recently wore a heart monitor for 14 days secondary to palpitations that woke her up at night and dizziness.  Holter monitor showed bundle branch block.  It also revealed ventricular tachycardia and tea.  She was referred to the atrial fibrillation clinic and encouraged to increase her bisoprolol from 5 mg to 7.5 mg daily.  Per patient, symptoms have improved.  She has occasional hot flashes that have improved since beginning black cohosh. She denies any new adenopathy.  She does report hot flashes and night sweats however this has been going on for 25+ years.  She denies any worsening.  She denies any new pains. Denies any nausea, vomiting, or diarrhea. Denies any new pains. Had not noticed any recent bleeding such as epistaxis, hematuria or hematochezia. Denies recent chest pain on exertion, shortness of breath on minimal exertion, pre-syncopal episodes, or palpitations. Denies any numbness or tingling in hands or feet. Denies any recent fevers, infections, or recent hospitalizations. Patient reports appetite at 50% and energy level at 75%.  She has lost about 17 pounds in the past year intentionally.   REVIEW OF SYSTEMS:  Review of Systems  Constitutional: Positive for fatigue. Negative for appetite change, fever and unexpected weight change.  HENT:   Negative for nosebleeds, sore throat and trouble swallowing.   Eyes: Negative.   Respiratory: Positive for shortness of breath. Negative for cough and wheezing.   Cardiovascular:  Negative.  Negative for chest pain and leg swelling.  Gastrointestinal: Negative for abdominal pain, blood in stool, constipation, diarrhea, nausea and vomiting.  Endocrine: Positive for hot flashes.  Genitourinary: Negative.  Negative for bladder incontinence, hematuria and nocturia.   Musculoskeletal: Negative.  Negative for back pain and flank pain.  Skin: Negative.   Neurological: Positive for dizziness and light-headedness. Negative for headaches and numbness.  Hematological: Negative.   Psychiatric/Behavioral: Positive for sleep disturbance. Negative for confusion. The patient is nervous/anxious.      PAST MEDICAL/SURGICAL HISTORY:  Past Medical History:  Diagnosis Date  . Bowel obstruction ([place])   . Breast cancer ([place])   . Breast cancer, stage 3 ([place]) 07/21/2011  . Bronchitis   . CLL (chronic lymphocytic leukemia) ([place]) 07/21/2011  . Migraines   . PAF (paroxysmal atrial fibrillation) ([place])   . Pneumonia    Past Surgical History:  Procedure Laterality Date  . ABDOMINAL HYSTERECTOMY    . APPENDECTOMY    . CHOLECYSTECTOMY    . LAPAROTOMY N/A 06/08/2015   Procedure: EXPLORATORY LAPAROTOMY;  Surgeon: Aviva Signs, MD;  Location: [facility];  Service: General;  Laterality: N/A;  . LYSIS OF ADHESION N/A 06/08/2015   Procedure: LYSIS OF ADHESION;  Surgeon: Aviva Signs, MD;  Location: [facility];  Service: General;  Laterality: N/A;  . MASTECTOMY     right  . TOTAL HIP ARTHROPLASTY     right     SOCIAL HISTORY:  Social History   Socioeconomic History  . Marital status: Widowed    Spouse name: Not on file  . Number of children: Not on  file  . Years of education: 9  . Highest education level: Not on file  Occupational History  . Not on file  Tobacco Use  . Smoking status: Former Smoker    Quit date: 10/26/1978    Years since quitting: 41.4  . Smokeless tobacco: Never Used  Vaping Use  . Vaping Use: Never used  Substance and Sexual Activity  . Alcohol use: No  . Drug use:  No  . Sexual activity: Not Currently  Other Topics Concern  . Not on file  Social History Narrative   Pt lives by herself   Social Determinants of Health   Financial Resource Strain: Low Risk   . Difficulty of Paying Living Expenses: Not hard at all  Food Insecurity: No Food Insecurity  . Worried About Charity fundraiser in the Last Year: Never true  . Ran Out of Food in the Last Year: Never true  Transportation Needs: No Transportation Needs  . Lack of Transportation (Medical): No  . Lack of Transportation (Non-Medical): No  Physical Activity: Inactive  . Days of Exercise per Week: 0 days  . Minutes of Exercise per Session: 0 min  Stress:   . Feeling of Stress : Not on file  Social Connections: Socially Isolated  . Frequency of Communication with Friends and Family: More than three times a week  . Frequency of Social Gatherings with Friends and Family: Twice a week  . Attends Religious Services: Never  . Active Member of Clubs or Organizations: No  . Attends Archivist Meetings: Never  . Marital Status: Widowed  Intimate Partner Violence: Not At Risk  . Fear of Current or Ex-Partner: No  . Emotionally Abused: No  . Physically Abused: No  . Sexually Abused: No    FAMILY HISTORY:  Family History  Problem Relation Age of Onset  . Cancer Brother   . Cancer Other     CURRENT MEDICATIONS:  Outpatient Encounter Medications as of 04/09/2020  Medication Sig  . acetaminophen (TYLENOL) 500 MG tablet Take 500 mg by mouth every 6 (six) hours as needed for mild pain, moderate pain or headache.  . albuterol (PROVENTIL HFA;VENTOLIN HFA) 108 (90 BASE) MCG/ACT inhaler Inhale 2 puffs into the lungs every 6 (six) hours as needed for shortness of breath.   Marland Kitchen albuterol (PROVENTIL) (2.5 MG/3ML) 0.083% nebulizer solution Take 3 mLs (2.5 mg total) by nebulization every 6 (six) hours as needed for wheezing or shortness of breath (when not relieved by inhaler.).  Marland Kitchen apixaban  (ELIQUIS) 5 MG TABS tablet Take 1 tablet (5 mg total) by mouth 2 (two) times daily.  . bisoprolol (ZEBETA) 5 MG tablet TAKE 1 TABLET DAILY.  Marland Kitchen Blood Glucose Monitoring Suppl ([place] FLEX SYSTEM) w/Device KIT   . cholecalciferol (VITAMIN D) 1000 UNITS tablet Take 1,000 Units by mouth daily.  Marland Kitchen diltiazem (CARDIZEM) 30 MG tablet Take 1 tablet (30 mg total) by mouth 2 (two) times daily.  . Lancets (ONETOUCH DELICA PLUS [id number]) MISC 2 (two) times daily.  Marland Kitchen omeprazole (PRILOSEC) 20 MG capsule Take 20 mg by mouth at bedtime.   Glory Rosebush VERIO test strip 2 (two) times daily.  Marland Kitchen spironolactone (ALDACTONE) 25 MG tablet Take 0.5 tablets (12.5 mg total) by mouth daily.  . ergocalciferol (VITAMIN D2) 1.25 MG (50000 UT) capsule Take 1 capsule (50,000 Units total) by mouth once a week.   No facility-administered encounter medications on file as of 04/09/2020.    ALLERGIES:  Allergies  Allergen  Reactions  . Codeine Nausea And Vomiting    headache  . Meclizine Other (See Comments)    Made dizziness worse.  . Sulfa Antibiotics Nausea And Vomiting    Stomach upset     PHYSICAL EXAM:  ECOG Performance status: 1  Vitals:   04/09/20 1030  BP: (!) 113/92  Pulse: 70  Resp: 20  Temp: (!) 97 F (36.1 C)  SpO2: 97%   Filed Weights   04/09/20 1030  Weight: 180 lb 8 oz (81.9 kg)   Physical Exam Constitutional:      Appearance: Normal appearance. She is normal weight.  Cardiovascular:     Rate and Rhythm: Normal rate and regular rhythm.     Heart sounds: Normal heart sounds.  Pulmonary:     Effort: Pulmonary effort is normal.     Breath sounds: Normal breath sounds.  Abdominal:     General: Bowel sounds are normal.     Palpations: Abdomen is soft.  Musculoskeletal:        General: Normal range of motion.  Skin:    General: Skin is warm.  Neurological:     Mental Status: She is alert and oriented to person, place, and time. Mental status is at baseline.  Psychiatric:         Mood and Affect: Mood normal.        Behavior: Behavior normal.        Thought Content: Thought content normal.        Judgment: Judgment normal.      LABORATORY DATA:  I have reviewed the labs as listed.  CBC    Component Value Date/Time   WBC 40.6 (H) 04/02/2020 1357   RBC 4.42 04/02/2020 1357   HGB 13.2 04/02/2020 1357   HGB 13.5 10/12/2012 1038   HCT 41.7 04/02/2020 1357   HCT 42.7 10/12/2012 1038   PLT 237 04/02/2020 1357   PLT 241 10/12/2012 1038   MCV 94.3 04/02/2020 1357   MCV 88.9 10/12/2012 1038   MCH 29.9 04/02/2020 1357   MCHC 31.7 04/02/2020 1357   RDW 14.3 04/02/2020 1357   RDW 14.4 10/12/2012 1038   LYMPHSABS 29.2 (H) 04/02/2020 1357   LYMPHSABS 57.9 (H) 10/12/2012 1038   MONOABS 2.4 (H) 04/02/2020 1357   MONOABS 1.2 (H) 10/12/2012 1038   EOSABS 0.0 04/02/2020 1357   EOSABS 0.4 10/12/2012 1038   BASOSABS 0.0 04/02/2020 1357   BASOSABS 0.3 (H) 10/12/2012 1038   CMP Latest Ref Rng & Units 04/02/2020 01/15/2020 10/01/2019  Glucose 70 - 99 mg/dL 111(H) 129(H) 210(H)  BUN 8 - 23 mg/dL 25(H) 32(H) 25(H)  Creatinine 0.44 - 1.00 mg/dL 0.96 0.98 0.82  Sodium 135 - 145 mmol/L 138 140 143  Potassium 3.5 - 5.1 mmol/L 4.0 4.5 4.1  Chloride 98 - 111 mmol/L 106 107 108  CO2 22 - 32 mmol/L 21(L) 22 23  Calcium 8.9 - 10.3 mg/dL 9.1 9.2 8.8(L)  Total Protein 6.5 - 8.1 g/dL 6.8 7.2 6.8  Total Bilirubin 0.3 - 1.2 mg/dL 0.7 0.5 0.6  Alkaline Phos 38 - 126 U/L 47 44 44  AST 15 - 41 U/L _0 ALT 0 - 44 U/L _1 All questions were answered to patient's stated satisfaction. Encouraged patient to call with any new concerns or questions before his next visit to the cancer center and we can certain see him sooner, if needed.     ASSESSMENT & PLAN:  1.  CLL: -  Patient was diagnosed 25+ years ago.  Has never required treatment. -Patient reports ongoing hot flashes and night sweats.  She denies any adenopathy.  No other B symptoms. -Labs remain relatively stable.   Latest labs done on 04/02/2020 showed WBC 40.6, hemoglobin 13.2, platelets 237. -On 03/26/2019 patient had a CT scan of her chest which revealed a 17 mm irregular soft tissue density along with the medial aspect of the left lung base. -CT of chest on 04/23/2019 showed interval resolution of previously demonstrated small pleural effusions.  No suspicious pulmonary nodules. -Patient will follow up in 6 months with repeat labs.  2.  Stage III right breast cancer: -Patient was diagnosed in 1980 /1999. -Treated with neoadjuvant chemotherapy followed by right mastectomy then XRT. -Last mammogram on 04/01/2019 was B RADS category 1 negative. -She has a scheduled repeat mammogram on 04/30/2020.  3.  Atrial fibrillation: -Recently referred to atrial fibrillation clinic secondary to palpitations mainly at night time. -Her bisoprolol was increased from 5 mg to 7.5 mg daily. -Symptoms have improved. -Follow-up is recommended.  4.  Vitamin D deficiency: -Currently on vitamin D 1000 units daily. -Vitamin D level from 04/02/2020 was low at 21.17. -Prescription sent for ergocalciferol 50,000 units weekly.  I have asked that she stop her other vitamin D supplementation. -We will recheck her vitamin D level at next visit.   Disposition: Return to clinic in 6 months with repeat lab work (CBC, CMP, vitamin D, LDH, pathology smear, folate).  Repeat mammogram on 04/30/2020. Start vitamin D ergocalciferol 50,000 units weekly.  No problem-specific Assessment & Plan notes found for this encounter.  Greater than 50% was spent in counseling and coordination of care with this patient including but not limited to discussion of the relevant topics above (See A&P) including, but not limited to diagnosis and management of acute and chronic medical conditions.   Orders placed this encounter:  No orders of the defined types were placed in this encounter.  Faythe Casa, NP 04/09/2020 11:31 AM  [facility] [phone]

## 2020-04-14 NOTE — Progress Notes (Signed)
Primary Care Physician: Lemmie Evens, MD Primary Cardiologist: Dr Harrington Challenger Primary Electrophysiologist: none Referring Physician: Moishe Spice is a 84 y.o. female with a history of mild AS, OSA, systolic CHF (LVEF 40 to 16% in 03/2019), LBBB, CLL, COPD, and  who presents for consultation in the [facility].  Patient is on Eliquis for a CHADS2VASC score of 4. Patient had recently been having palpitations and presyncopal episodes. A 14 day Zio monitor showed 3% afib burden and also 43 episodes of SVT. She was started on diltiazem. Since then, she has not had further presyncopal episodes. However, she did have an episode of chest pain while laying on her left side as well as palpitations occurring around 3 am on 03/22/20. This resolved within a few minutes. She denies any bleeding issues on anticoagulation.   On follow up today, patient reports she has done reasonably well since her last visit. She did have one presyncopal episode since her last visit which lasted ~ 5 minutes. She feels her symptoms have improved since increasing bisoprolol.   Today, she denies symptoms of palpitations, chest pain, shortness of breath, orthopnea, PND, lower extremity edema, syncope, bleeding, or neurologic sequela. The patient is tolerating medications without difficulties and is otherwise without complaint today.    Atrial Fibrillation Risk Factors:  she does have symptoms or diagnosis of sleep apnea. she is not compliant with CPAP therapy. she does not have a history of rheumatic fever. she does not have a history of alcohol use. The patient does have a history of early familial atrial fibrillation or other arrhythmias. Brother has PPM.  she has a BMI of Body mass index is 34.24 kg/m.Marland Kitchen Filed Weights   04/15/20 1027  Weight: 82.2 kg    Family History  Problem Relation Age of Onset  . Cancer Brother   . Cancer Other      Atrial Fibrillation  Management history:  Previous antiarrhythmic drugs: none Previous cardioversions: none Previous ablations: none CHADS2VASC score: 4 Anticoagulation history: Eliquis    Past Medical History:  Diagnosis Date  . Bowel obstruction ([place])   . Breast cancer ([place])   . Breast cancer, stage 3 ([place]) 07/21/2011  . Bronchitis   . CLL (chronic lymphocytic leukemia) ([place]) 07/21/2011  . Migraines   . PAF (paroxysmal atrial fibrillation) ([place])   . Pneumonia    Past Surgical History:  Procedure Laterality Date  . ABDOMINAL HYSTERECTOMY    . APPENDECTOMY    . CHOLECYSTECTOMY    . LAPAROTOMY N/A 06/08/2015   Procedure: EXPLORATORY LAPAROTOMY;  Surgeon: Aviva Signs, MD;  Location: [facility];  Service: General;  Laterality: N/A;  . LYSIS OF ADHESION N/A 06/08/2015   Procedure: LYSIS OF ADHESION;  Surgeon: Aviva Signs, MD;  Location: [facility];  Service: General;  Laterality: N/A;  . MASTECTOMY     right  . TOTAL HIP ARTHROPLASTY     right    Current Outpatient Medications  Medication Sig Dispense Refill  . acetaminophen (TYLENOL) 500 MG tablet Take 500 mg by mouth every 6 (six) hours as needed for mild pain, moderate pain or headache.    . albuterol (PROVENTIL HFA;VENTOLIN HFA) 108 (90 BASE) MCG/ACT inhaler Inhale 2 puffs into the lungs every 6 (six) hours as needed for shortness of breath.     Marland Kitchen albuterol (PROVENTIL) (2.5 MG/3ML) 0.083% nebulizer solution Take 3 mLs (2.5 mg total) by nebulization every 6 (six) hours as needed for wheezing or shortness  of breath (when not relieved by inhaler.). 75 mL 12  . apixaban (ELIQUIS) 5 MG TABS tablet Take 1 tablet (5 mg total) by mouth 2 (two) times daily. 180 tablet 1  . bisoprolol (ZEBETA) 5 MG tablet Take 1.5 tablets (7.5 mg total) by mouth daily. 90 tablet 1  . Blood Glucose Monitoring Suppl ([place] FLEX SYSTEM) w/Device KIT     . cholecalciferol (VITAMIN D) 1000 UNITS tablet Take 1,000 Units by mouth daily.    Marland Kitchen diltiazem (CARDIZEM) 30 MG tablet  Take 1 tablet (30 mg total) by mouth 2 (two) times daily. 60 tablet 2  . ergocalciferol (VITAMIN D2) 1.25 MG (50000 UT) capsule Take 1 capsule (50,000 Units total) by mouth once a week. 12 capsule 1  . Lancets (ONETOUCH DELICA PLUS [id number]) MISC 2 (two) times daily.    Marland Kitchen omeprazole (PRILOSEC) 20 MG capsule Take 20 mg by mouth at bedtime.     Glory Rosebush VERIO test strip 2 (two) times daily.    Marland Kitchen spironolactone (ALDACTONE) 25 MG tablet Take 0.5 tablets (12.5 mg total) by mouth daily. 45 tablet 1   No current facility-administered medications for this encounter.    Allergies  Allergen Reactions  . Codeine Nausea And Vomiting    headache  . Meclizine Other (See Comments)    Made dizziness worse.  . Sulfa Antibiotics Nausea And Vomiting    Stomach upset    Social History   Socioeconomic History  . Marital status: Widowed    Spouse name: Not on file  . Number of children: Not on file  . Years of education: 58  . Highest education level: Not on file  Occupational History  . Not on file  Tobacco Use  . Smoking status: Former Smoker    Quit date: 10/26/1978    Years since quitting: 41.4  . Smokeless tobacco: Never Used  Vaping Use  . Vaping Use: Never used  Substance and Sexual Activity  . Alcohol use: No  . Drug use: No  . Sexual activity: Not Currently  Other Topics Concern  . Not on file  Social History Narrative   Pt lives by herself   Social Determinants of Health   Financial Resource Strain: Low Risk   . Difficulty of Paying Living Expenses: Not hard at all  Food Insecurity: No Food Insecurity  . Worried About Charity fundraiser in the Last Year: Never true  . Ran Out of Food in the Last Year: Never true  Transportation Needs: No Transportation Needs  . Lack of Transportation (Medical): No  . Lack of Transportation (Non-Medical): No  Physical Activity: Inactive  . Days of Exercise per Week: 0 days  . Minutes of Exercise per Session: 0 min  Stress:   . Feeling  of Stress : Not on file  Social Connections: Socially Isolated  . Frequency of Communication with Friends and Family: More than three times a week  . Frequency of Social Gatherings with Friends and Family: Twice a week  . Attends Religious Services: Never  . Active Member of Clubs or Organizations: No  . Attends Archivist Meetings: Never  . Marital Status: Widowed  Intimate Partner Violence: Not At Risk  . Fear of Current or Ex-Partner: No  . Emotionally Abused: No  . Physically Abused: No  . Sexually Abused: No     ROS- All systems are reviewed and negative except as per the HPI above.  Physical Exam: Vitals:   04/15/20 1027  BP: 132/60  Pulse: (!) 54  SpO2: 98%  Weight: 82.2 kg  Height: 5' 1"  (1.549 m)    GEN- The patient is well appearing obese elderly female, alert and oriented x 3 today.   HEENT-head normocephalic, atraumatic, sclera clear, conjunctiva pink, hearing intact, trachea midline. Lungs- Clear to ausculation bilaterally, normal work of breathing Heart- Regular rate and rhythm, no murmurs, rubs or gallops  GI- soft, NT, ND, + BS Extremities- no clubbing, cyanosis, or edema MS- no significant deformity or atrophy Skin- no rash or lesion Psych- euthymic mood, full affect Neuro- strength and sensation are intact   Wt Readings from Last 3 Encounters:  04/15/20 82.2 kg  04/09/20 81.9 kg  03/23/20 81.6 kg    EKG today demonstrates SB HR 54, LBBB, PR 192, QRS 134, QTc 447  Echo 03/26/19 demonstrated  1. Left ventricular ejection fraction, by visual estimation, is 40 to  45%. The left ventricle has mild to moderately decreased function. There  is moderately increased left ventricular hypertrophy.  2. Abnormal septal motion consistent with left bundle branch block.  3. Left ventricular diastolic parameters are consistent with Grade II  diastolic dysfunction (pseudonormalization).  4. Global right ventricle has normal systolic function.The  right  ventricular size is normal. No increase in right ventricular wall  thickness.  5. Left atrial size was severely dilated.  6. Right atrial size was normal.  7. Presence of pericardial fat pad.  8. Mild to moderate aortic valve annular calcification.  9. Moderate mitral annular calcification.  10. The mitral valve is grossly normal. Mild mitral valve regurgitation.  11. The tricuspid valve is grossly normal. Tricuspid valve regurgitation  is mild.  12. Aortic valve mean gradient measures 11.0 mmHg.  13. The aortic valve is tricuspid. Aortic valve regurgitation is not  visualized. Mild aortic valve stenosis.  14. The pulmonic valve was grossly normal. Pulmonic valve regurgitation is  trivial.  15. Mildly elevated pulmonary artery systolic pressure.  16. The tricuspid regurgitant velocity is 3.04 m/s, and with an assumed  right atrial pressure of 3 mmHg, the estimated right ventricular systolic  pressure is mildly elevated at 40.0 mmHg.  17. The inferior vena cava is normal in size with greater than 50%  respiratory variability, suggesting right atrial pressure of 3 mmHg.   In comparison to the previous echocardiogram(s): Compared to the prior  study in February 2020 LVEF has decreased.  Epic records are reviewed at length today  CHA2DS2-VASc Score = 4  The patient's score is based upon: CHF History: 1 HTN History: 0 Diabetes History: 0 Stroke History: 0 Vascular Disease History: 0 Age Score: 2 Gender Score: 1      ASSESSMENT AND PLAN: 1. Paroxysmal Atrial Fibrillation/SVT The patient's CHA2DS2-VASc score is 4, indicating a 4.8% annual risk of stroke.   Patient in [place] today. Patient feels improved on higher dose of BB. We discussed AAD therapy today, patient would prefer to continue present therapy for now.  Continue bisoprolol 7.5 mg daily Continue Eliquis 5 mg BID Continue diltiazem 30 mg BID  2. Secondary Hypercoagulable State (ICD10:  D68.69) The patient  is at significant risk for stroke/thromboembolism based upon her CHA2DS2-VASc Score of 4.  Continue Apixaban (Eliquis).   3. Obesity Body mass index is 34.24 kg/m. Lifestyle modification was discussed and encouraged including regular physical activity and weight reduction.  4. Obstructive sleep apnea Patient is not compliant with CPAP therapy.   5. Chronic systolic CHF No signs or symptoms of fluid  overload.    Follow up with Bernerd Pho as scheduled. AF clinic in 6 months.    [facility] [address] [place], [place] [address] [phone] 04/15/2020 11:31 AM

## 2020-04-15 ENCOUNTER — Other Ambulatory Visit: Payer: Self-pay

## 2020-04-15 ENCOUNTER — Encounter (HOSPITAL_COMMUNITY): Payer: Self-pay | Admitting: Physician Assistant

## 2020-04-15 ENCOUNTER — Ambulatory Visit (HOSPITAL_COMMUNITY)
Admission: RE | Admit: 2020-04-15 | Discharge: 2020-04-15 | Disposition: A | Discharge status: Home / Self Care | Payer: Medicare HMO | Source: Ambulatory Visit | Attending: Physician Assistant | Admitting: Physician Assistant

## 2020-04-15 VITALS — BP 132/60 | HR 54 | Ht 61.0 in | Wt 181.2 lb

## 2020-04-15 DIAGNOSIS — Z7901 Long term (current) use of anticoagulants: Secondary | ICD-10-CM | POA: Diagnosis not present

## 2020-04-15 DIAGNOSIS — D6869 Other thrombophilia: Secondary | ICD-10-CM | POA: Insufficient documentation

## 2020-04-15 DIAGNOSIS — E669 Obesity, unspecified: Secondary | ICD-10-CM | POA: Insufficient documentation

## 2020-04-15 DIAGNOSIS — Z6834 Body mass index (BMI) 34.0-34.9, adult: Secondary | ICD-10-CM | POA: Insufficient documentation

## 2020-04-15 DIAGNOSIS — I471 Supraventricular tachycardia: Secondary | ICD-10-CM | POA: Insufficient documentation

## 2020-04-15 DIAGNOSIS — Z87891 Personal history of nicotine dependence: Secondary | ICD-10-CM | POA: Diagnosis not present

## 2020-04-15 DIAGNOSIS — G4733 Obstructive sleep apnea (adult) (pediatric): Secondary | ICD-10-CM | POA: Diagnosis not present

## 2020-04-15 DIAGNOSIS — I5022 Chronic systolic (congestive) heart failure: Secondary | ICD-10-CM | POA: Diagnosis not present

## 2020-04-15 DIAGNOSIS — I480 Paroxysmal atrial fibrillation: Secondary | ICD-10-CM | POA: Diagnosis not present

## 2020-04-15 DIAGNOSIS — I083 Combined rheumatic disorders of mitral, aortic and tricuspid valves: Secondary | ICD-10-CM | POA: Insufficient documentation

## 2020-04-15 DIAGNOSIS — I454 Nonspecific intraventricular block: Secondary | ICD-10-CM | POA: Insufficient documentation

## 2020-04-15 DIAGNOSIS — I447 Left bundle-branch block, unspecified: Secondary | ICD-10-CM | POA: Insufficient documentation

## 2020-04-15 MED ORDER — BISOPROLOL FUMARATE 5 MG PO TABS
7.5000 mg | ORAL_TABLET | Freq: Every day | ORAL | 1 refills | Status: DC
Start: 2020-04-15 — End: 2020-06-05

## 2020-04-15 NOTE — Patient Instructions (Signed)
Bisoprolol 7.5mg  Daily

## 2020-04-30 ENCOUNTER — Ambulatory Visit (HOSPITAL_COMMUNITY): Payer: Medicare HMO

## 2020-05-07 ENCOUNTER — Other Ambulatory Visit: Payer: Self-pay

## 2020-05-07 ENCOUNTER — Ambulatory Visit (HOSPITAL_COMMUNITY)
Admission: RE | Admit: 2020-05-07 | Discharge: 2020-05-07 | Disposition: A | Discharge status: Home / Self Care | Payer: Medicare HMO | Source: Ambulatory Visit | Attending: Internal Medicine | Admitting: Internal Medicine

## 2020-05-07 DIAGNOSIS — Z1231 Encounter for screening mammogram for malignant neoplasm of breast: Secondary | ICD-10-CM | POA: Insufficient documentation

## 2020-06-01 ENCOUNTER — Ambulatory Visit (HOSPITAL_COMMUNITY)
Admission: RE | Admit: 2020-06-01 | Discharge: 2020-06-01 | Disposition: A | Discharge status: Home / Self Care | Payer: Medicare HMO | Source: Ambulatory Visit | Attending: Internal Medicine | Admitting: Internal Medicine

## 2020-06-01 ENCOUNTER — Other Ambulatory Visit: Payer: Self-pay

## 2020-06-01 DIAGNOSIS — I350 Nonrheumatic aortic (valve) stenosis: Secondary | ICD-10-CM | POA: Diagnosis not present

## 2020-06-01 DIAGNOSIS — I5022 Chronic systolic (congestive) heart failure: Secondary | ICD-10-CM | POA: Diagnosis not present

## 2020-06-01 LAB — ECHOCARDIOGRAM COMPLETE
AR max vel: 1.48 cm2
AV Area VTI: 1.43 cm2
AV Area mean vel: 1.43 cm2
AV Mean grad: 10.5 mmHg
AV Peak grad: 19 mmHg
Ao pk vel: 2.18 m/s
Area-P 1/2: 1.52 cm2
S' Lateral: 4.7 cm

## 2020-06-01 NOTE — Progress Notes (Signed)
*  PRELIMINARY RESULTS* Echocardiogram 2D Echocardiogram has been performed.  [name] 06/01/2020, 12:49 PM

## 2020-06-05 ENCOUNTER — Other Ambulatory Visit (HOSPITAL_COMMUNITY): Payer: Self-pay | Admitting: *Deleted

## 2020-06-05 MED ORDER — BISOPROLOL FUMARATE 5 MG PO TABS
7.5000 mg | ORAL_TABLET | Freq: Every day | ORAL | 2 refills | Status: DC
Start: 2020-06-05 — End: 2020-06-18

## 2020-06-18 ENCOUNTER — Ambulatory Visit (INDEPENDENT_AMBULATORY_CARE_PROVIDER_SITE_OTHER): Payer: Medicare HMO | Admitting: Student

## 2020-06-18 ENCOUNTER — Encounter: Payer: Self-pay | Admitting: *Deleted

## 2020-06-18 ENCOUNTER — Encounter: Payer: Self-pay | Admitting: Student

## 2020-06-18 ENCOUNTER — Other Ambulatory Visit: Payer: Self-pay

## 2020-06-18 VITALS — BP 124/78 | HR 58 | Ht 61.0 in | Wt 178.0 lb

## 2020-06-18 DIAGNOSIS — I5022 Chronic systolic (congestive) heart failure: Secondary | ICD-10-CM

## 2020-06-18 DIAGNOSIS — I350 Nonrheumatic aortic (valve) stenosis: Secondary | ICD-10-CM

## 2020-06-18 DIAGNOSIS — I429 Cardiomyopathy, unspecified: Secondary | ICD-10-CM

## 2020-06-18 DIAGNOSIS — I480 Paroxysmal atrial fibrillation: Secondary | ICD-10-CM

## 2020-06-18 DIAGNOSIS — R079 Chest pain, unspecified: Secondary | ICD-10-CM

## 2020-06-18 MED ORDER — BISOPROLOL FUMARATE 10 MG PO TABS: 10.0000 mg | ORAL_TABLET | Freq: Every day | ORAL | 3 refills | Status: DC

## 2020-06-18 NOTE — Patient Instructions (Addendum)
Medication Instructions:  Your physician has recommended you make the following change in your medication:   Stop Taking Cardizem( Diltiazem)  Increase Bisoprolol to 10 mg Daily   *If you need a refill on your cardiac medications before your next appointment, please call your pharmacy*   Lab Work: NONE   If you have labs (blood work) drawn today and your tests are completely normal, you will receive your results only by: Marland Kitchen MyChart Message (if you have MyChart) OR . A paper copy in the mail If you have any lab test that is abnormal or we need to change your treatment, we will call you to review the results.   Testing/Procedures: Your physician has requested that you have a lexiscan myoview. For further information please visit [email or web address]. Please follow instruction sheet, as given.     Follow-Up: At [facility], you and your health needs are our priority.  As part of our continuing mission to provide you with exceptional heart care, we have created designated Provider Care Teams.  These Care Teams include your primary Cardiologist (physician) and Advanced Practice Providers (APPs -  Physician Assistants and Nurse Practitioners) who all work together to provide you with the care you need, when you need it.  We recommend signing up for the patient portal called "MyChart".  Sign up information is provided on this After Visit Summary.  MyChart is used to connect with patients for Virtual Visits (Telemedicine).  Patients are able to view lab/test results, encounter notes, upcoming appointments, etc.  Non-urgent messages can be sent to your provider as well.   To learn more about what you can do with MyChart, go to [email or web address].    Your next appointment:   2-3 month(s)  The format for your next appointment:   In Person  Provider:   Dorris Carnes, MD   Other Instructions Thank you for choosing [facility]!

## 2020-06-18 NOTE — Progress Notes (Signed)
Cardiology Office Note    Date:  06/18/2020   ID:  DAJANEE VOORHEIS, DOB 1935-12-09, MRN [id number]  PCP:  Lemmie Evens, MD  Cardiologist: Dorris Carnes, MD    Chief Complaint  Patient presents with  . Follow-up    3 month visit    History of Present Illness:    ASPYNN CLOVER is a 85 y.o. female with past medical history of paroxysmal atrial fibrillation, mild AS, secondary cardiomyopathy (EF previously 55-60% in 06/2018, reduced to 40-45% by repeat echo in 03/2019), LBBB, CLL, and COPD who presents to the office today for 80-monthfollow-up.   She was examined by AKatina Dung NP in 02/2020 and had recently worn an event monitor due to episodes of presyncope and palpitations.  Her monitor showed predominantly normal sinus rhythm with an average heart rate of 60 bpm but she did have occasional runs of NSVT with the longest being 7 beats.  She had a 3% atrial fibrillation burden and her average heart rate while in the rhythm was 83 bpm.  Was started on Cardizem 30 mg twice daily [facility]  She did see CMalka So PA on 03/23/2020 and Bisoprolol was increased to 7.5 mg daily with her remaining on Eliquis for anticoagulation and short acting Cardizem 30 mg twice daily. At follow-up on 04/15/2020 she reported her symptoms had improved and she was continued on her current therapy. Antiarrhythmic therapy was reviewed but she preferred to remain on her current regimen. Her repeat echocardiogram in 05/2020 showed her EF was reduced at 35 to 40% with anterior septal and anterior hypokinesis. She did have grade 1 diastolic dysfunction, normal RV function, severely dilated LA size and a trivial pericardial effusion. Her aortic stenosis was in a mild to moderate range. Dr. RHarrington Challengerdid not change any of her medical therapy at that time.  In talking with the patient today, she reports having dyspnea on exertion and fatigue at baseline. No recent orthopnea, PND or  edema. She does report episodes of chest pain intermittently over the past few months which she describes as a discomfort underneath her left breast. [place] and rest for several minutes for symptoms to improve. Denies any recurrent symptoms today.    Past Medical History:  Diagnosis Date  . Bowel obstruction ([place]   . Breast cancer ([place]   . Breast cancer, stage 3 ([place] 07/21/2011  . Bronchitis   . CLL (chronic lymphocytic leukemia) ([place] 07/21/2011  . Migraines   . PAF (paroxysmal atrial fibrillation) ([facility]   . Pneumonia     Past Surgical History:  Procedure Laterality Date  . ABDOMINAL HYSTERECTOMY    . APPENDECTOMY    . CHOLECYSTECTOMY    . LAPAROTOMY N/A 06/08/2015   Procedure: EXPLORATORY LAPAROTOMY;  Surgeon: MAviva Signs MD;  Location: [facility];  Service: General;  Laterality: N/A;  . LYSIS OF ADHESION N/A 06/08/2015   Procedure: LYSIS OF ADHESION;  Surgeon: MAviva Signs MD;  Location: [facility];  Service: General;  Laterality: N/A;  . MASTECTOMY     right  . TOTAL HIP ARTHROPLASTY     right    Current Medications: Outpatient Medications Prior to Visit  Medication Sig Dispense Refill  . acetaminophen (TYLENOL) 500 MG tablet Take 500 mg by mouth every 6 (six) hours as needed for mild pain, moderate pain or headache.    . albuterol (PROVENTIL HFA;VENTOLIN HFA) 108 (  90 BASE) MCG/ACT inhaler Inhale 2 puffs into the lungs every 6 (six) hours as needed for shortness of breath.     Marland Kitchen albuterol (PROVENTIL) (2.5 MG/3ML) 0.083% nebulizer solution Take 3 mLs (2.5 mg total) by nebulization every 6 (six) hours as needed for wheezing or shortness of breath (when not relieved by inhaler.). 75 mL 12  . apixaban (ELIQUIS) 5 MG TABS tablet Take 1 tablet (5 mg total) by mouth 2 (two) times daily. 180 tablet 1  . cholecalciferol (VITAMIN D) 1000 UNITS tablet Take 1,000 Units by mouth daily.    Marland Kitchen diltiazem (CARDIZEM) 30 MG tablet Take 1  tablet (30 mg total) by mouth 2 (two) times daily. 60 tablet 2  . ergocalciferol (VITAMIN D2) 1.25 MG (50000 UT) capsule Take 1 capsule (50,000 Units total) by mouth once a week. 12 capsule 1  . omeprazole (PRILOSEC) 20 MG capsule Take 20 mg by mouth at bedtime.     Marland Kitchen spironolactone (ALDACTONE) 25 MG tablet Take 0.5 tablets (12.5 mg total) by mouth daily. 45 tablet 1  . bisoprolol (ZEBETA) 5 MG tablet Take 1.5 tablets (7.5 mg total) by mouth daily. 135 tablet 2  . Blood Glucose Monitoring Suppl ([place]ws) w/Device KIT  (Patient not taking: Reported on 06/18/2020)    . Lancets (ONETOUCH DELICA PLUS [id number]) MISC 2 (two) times daily. (Patient not taking: Reported on 06/18/2020)    . ONETOUCH VERIO test strip 2 (two) times daily. (Patient not taking: Reported on 06/18/2020)     No facility-administered medications prior to visit.     Allergies:   Codeine, Meclizine, and Sulfa antibiotics   Social History   Socioeconomic History  . Marital status: Widowed    Spouse name: Not on file  . Number of children: Not on file  . Years of education: 86  . Highest education level: Not on file  Occupational History  . Not on file  Tobacco Use  . Smoking status: Former Smoker    Quit date: 10/26/1978    Years since quitting: 41.6  . Smokeless tobacco: Never Used  Vaping Use  . Vaping Use: Never used  Substance and Sexual Activity  . Alcohol use: No  . Drug use: No  . Sexual activity: Not Currently  Other Topics Concern  . Not on file  Social History Narrative   Pt lives by herself   Social Determinants of Health   Financial Resource Strain: Low Risk   . Difficulty of Paying Living Expenses: Not hard at all  Food Insecurity: No Food Insecurity  . Worried About Charity fundraiser in the Last Year: Never true  . Ran Out of Food in the Last Year: Never true  Transportation Needs: No Transportation Needs  . Lack of Transportation (Medical): No  . Lack of Transportation  (Non-Medical): No  Physical Activity: Inactive  . Days of Exercise per Week: 0 days  . Minutes of Exercise per Session: 0 min  Stress: Not on file  Social Connections: Socially Isolated  . Frequency of Communication with Friends and Family: More than three times a week  . Frequency of Social Gatherings with Friends and Family: Twice a week  . Attends Religious Services: Never  . Active Member of Clubs or Organizations: No  . Attends Archivist Meetings: Never  . Marital Status: Widowed     Family History:  The patient's family history includes Cancer in her brother and another family member.   Review of Systems:  Please see the history of present illness.     General:  No chills, fever, night sweats or weight changes.  Cardiovascular:  No edema, orthopnea, paroxysmal nocturnal dyspnea. Positive for chest pain, dyspnea on exertion and palpitations.  Dermatological: No rash, lesions/masses Respiratory: No cough, dyspnea Urologic: No hematuria, dysuria Abdominal:   No nausea, vomiting, diarrhea, bright red blood per rectum, melena, or hematemesis Neurologic:  No visual changes, wkns, changes in mental status. All other systems reviewed and are otherwise negative except as noted above.   Physical Exam:    VS:  BP 124/78   Pulse (!) 58   Ht 5' 1"  (1.549 m)   Wt 178 lb (80.7 kg)   SpO2 98%   BMI 33.63 kg/m    General: Well developed, elderly female appearing in no acute distress. Head: Normocephalic, atraumatic. Neck: No carotid bruits. JVD not elevated.  Lungs: Respirations regular and unlabored, without wheezes or rales.  Heart: Regular rate and rhythm. 2/6 SEM along RUSB.  Abdomen: Appears non-distended. No obvious abdominal masses. Msk:  Strength and tone appear normal for age. No obvious joint deformities or effusions. Extremities: No clubbing or cyanosis. No edema.  Distal pedal pulses are 2+ bilaterally. Neuro: Alert and oriented X 3. Moves all extremities  spontaneously. No focal deficits noted. Psych:  Responds to questions appropriately with a normal affect. Skin: No rashes or lesions noted  Wt Readings from Last 3 Encounters:  06/18/20 178 lb (80.7 kg)  04/15/20 181 lb 3.2 oz (82.2 kg)  04/09/20 180 lb 8 oz (81.9 kg)     Studies/Labs Reviewed:   EKG:  EKG is ordered today. The ekg ordered today demonstrates sinus bradycardia, HR 54 with known LBBB.   Recent Labs: 01/15/2020: TSH 2.070 04/02/2020: ALT 29; BUN 25; Creatinine, Ser 0.96; Hemoglobin 13.2; Platelets 237; Potassium 4.0; Sodium 138   Lipid Panel    Component Value Date/Time   CHOL 197 01/15/2020 1312   TRIG 134 01/15/2020 1312   HDL 43 01/15/2020 1312   CHOLHDL 4.6 01/15/2020 1312   VLDL 27 01/15/2020 1312   LDLCALC 127 (H) 01/15/2020 1312    Additional studies/ records that were reviewed today include:    Event Monitor: 02/2020 Patient had a min HR of 39 bpm, max HR of 190 bpm, and avg HR of 60 bpm. Predominant underlying rhythm was Sinus Rhythm. Bundle Branch Block/IVCD was present. 3 Ventricular Tachycardia runs occurred, the run with the fastest interval lasting 7 beats with a max rate of 190 bpm, the longest lasting 4 beats with an avg rate of 128 bpm. 43 Supraventricular Tachycardia runs occurred, the run with the fastest interval lasting 14 beats with a max rate of 164 bpm, the longest lasting 19 beats with an avg rate of 127 bpm. Atrial Fibrillation occurred (3% burden), ranging from 59-137 bpm (avg of 83 bpm), the longest lasting 9 hours 23 mins with an avg rate of 83 bpm. Atrial Fibrillation was present at de-activation of device. Ventricular Tachycardia, Supraventricular Tachycardia and Atrial Fibrillation were detected within +/- 45 seconds of symptomatic patient event(s). Isolated SVEs were frequent (6.3%, [id number]), SVE Couplets were rare (<1.0%, 481), and SVE Triplets were rare (<1.0%, 64). Isolated VEs were occasional (1.5%, 16490), VE Couplets were  rare (<1.0%, 628), and no VE Triplets were present. Ventricular Bigeminy was present. Some VEs may be SVEs with possible aberrancy.  Echocardiogram: 06/01/2020 IMPRESSIONS    1. Left ventricular ejection fraction, by estimation, is 35 to 40%. The  left  ventricle has moderately decreased function. The left ventricle  demonstrates regional wall motion abnormalities (see scoring  diagram/findings for description). There is mild  left ventricular hypertrophy. Left ventricular diastolic parameters are  consistent with Grade I diastolic dysfunction (impaired relaxation).  Elevated left atrial pressure.  2. Right ventricular systolic function is normal. The right ventricular  size is normal. There is normal pulmonary artery systolic pressure.  3. Left atrial size was severely dilated.  4. The pericardial effusion is circumferential.  5. The mitral valve is normal in structure. No evidence of mitral valve  regurgitation. No evidence of mitral stenosis.  6. The aortic valve is tricuspid. There is moderate calcification of the  aortic valve. There is moderate thickening of the aortic valve. Aortic  valve regurgitation is not visualized. Mild to moderate aortic valve  stenosis.  7. The inferior vena cava is normal in size with greater than 50%  respiratory variability, suggesting right atrial pressure of 3 mmHg.   Assessment:    1. Chest pain of uncertain etiology   2. Cardiomyopathy, unspecified type ([place])   3. Chronic systolic heart failure ([place])   4. PAF (paroxysmal atrial fibrillation) ([place])   5. Aortic valve stenosis, etiology of cardiac valve disease unspecified      Plan:   In order of problems listed above:  1. Chest Pain/Cardiomyopathy - She describes intermittent episodes of chest pain as outlined above and they can occur at rest or with activity. Did have an episode yesterday while walking around [employer]. EKG today shows her known LBBB. Given her symptoms along with  worsening cardiomyopathy, will plan for a Lexiscan Myoview for ischemic evaluation.   2. Chronic Systolic CHF - Her EF was previously normal at 55-60% in 06/2018 but reduced to 40-45% by repeat echo in 03/2019. Repeat echo earlier this month showed her EF is further reduced at 35 to 40% with anterior septal and anterior hypokinesis. Will plan for ischemic evaluation as outlined above.  - She is currently on short-acting Cardizem and will discontinue this given her EF. Will titrate Bisoprolol to 27m daily. Continue Spironolactone 12.5637mdaily. Her BP did not previously tolerate an ACE-I or ARB by review of notes but is at 124/78 today. Would follow with the above medication changes and consider adding at follow-up if BP allows.    3. Paroxysmal Atrial Fibrillation - She still experiences occasional palpitations but says they have overall been stable. She is currently on Bisoprolol 7.37m34maily and short-acting Cardizem 59m69mD. Cardizem is now contraindicated given her reduced EF, therefore will stop Cardizem and titrate Bisoprolol to 10mg69mly to compensate for this. I encouraged her to reach out if she experiences worsening palpitations as this may require further adjustment. She did not tolerate Toprol-XL by review of prior notes.  - She denies any evidence of active bleeding. Remains on Eliquis 5 mg BID for anticoagulation.  - She prefers to follow-up locally given transportation issues to [facility]. Aortic Stenosis - AS was mild to moderate by most recent echocardiogram earlier this month. Will continue to follow.    Shared Decision Making/Informed Consent:   Shared Decision Making/Informed Consent The risks [chest pain, shortness of breath, cardiac arrhythmias, dizziness, blood pressure fluctuations, myocardial infarction, stroke/transient ischemic attack, nausea, vomiting, allergic reaction, radiation exposure, metallic taste sensation and life-threatening  complications (estimated to be 1 in 10,000)], benefits (risk stratification, diagnosing coronary artery disease, treatment guidance) and alternatives of a nuclear stress test were discussed  in detail with Ms. Gault and she agrees to proceed.       Medication Adjustments/Labs and Tests Ordered: Current medicines are reviewed at length with the patient today.  Concerns regarding medicines are outlined above.  Medication changes, Labs and Tests ordered today are listed in the Patient Instructions below. Patient Instructions  Medication Instructions:  Your physician has recommended you make the following change in your medication:   Stop Taking Cardizem( Diltiazem)  Increase Bisoprolol to 10 mg Daily   *If you need a refill on your cardiac medications before your next appointment, please call your pharmacy*   Lab Work: NONE   If you have labs (blood work) drawn today and your tests are completely normal, you will receive your results only by: Marland Kitchen MyChart Message (if you have MyChart) OR . A paper copy in the mail If you have any lab test that is abnormal or we need to change your treatment, we will call you to review the results.   Testing/Procedures: Your physician has requested that you have a lexiscan myoview. For further information please visit [email or web address]. Please follow instruction sheet, as given.     Follow-Up: At [facility], you and your health needs are our priority.  As part of our continuing mission to provide you with exceptional heart care, we have created designated Provider Care Teams.  These Care Teams include your primary Cardiologist (physician) and Advanced Practice Providers (APPs -  Physician Assistants and Nurse Practitioners) who all work together to provide you with the care you need, when you need it.  We recommend signing up for the patient portal called "MyChart".  Sign up information is provided on this After Visit Summary.  MyChart is used  to connect with patients for Virtual Visits (Telemedicine).  Patients are able to view lab/test results, encounter notes, upcoming appointments, etc.  Non-urgent messages can be sent to your provider as well.   To learn more about what you can do with MyChart, go to [email or web address].    Your next appointment:   2-3 month(s)  The format for your next appointment:   In Person  Provider:   Dorris Carnes, MD   Other Instructions Thank you for choosing [facility]!       Signed, Erma Heritage, PA-C  06/18/2020 8:07 PM    [place] [address] [place], [place] [address] Phone: [phone] Fax: [phone]

## 2020-06-26 ENCOUNTER — Encounter (HOSPITAL_COMMUNITY): Payer: Self-pay

## 2020-06-26 ENCOUNTER — Ambulatory Visit (HOSPITAL_COMMUNITY)
Admission: RE | Admit: 2020-06-26 | Discharge: 2020-06-26 | Disposition: A | Discharge status: Home / Self Care | Payer: Medicare HMO | Source: Ambulatory Visit | Attending: Student | Admitting: Student

## 2020-06-26 ENCOUNTER — Other Ambulatory Visit: Payer: Self-pay

## 2020-06-26 ENCOUNTER — Encounter (HOSPITAL_COMMUNITY)
Admission: RE | Admit: 2020-06-26 | Discharge: 2020-06-26 | Disposition: A | Discharge status: Home / Self Care | Payer: Medicare HMO | Source: Ambulatory Visit | Attending: Student | Admitting: Student

## 2020-06-26 DIAGNOSIS — I429 Cardiomyopathy, unspecified: Secondary | ICD-10-CM | POA: Diagnosis not present

## 2020-06-26 HISTORY — DX: Heart failure, unspecified: I50.9

## 2020-06-26 LAB — NM MYOCAR MULTI W/SPECT W/WALL MOTION / EF
LV dias vol: 103 mL (ref 46–106)
LV sys vol: 58 mL
Peak HR: 71 {beats}/min
RATE: 0.61
Rest HR: 58 {beats}/min
SDS: 8
SRS: 11
SSS: 19
TID: 1.08

## 2020-06-26 MED ORDER — SODIUM CHLORIDE FLUSH 0.9 % IV SOLN
INTRAVENOUS | Status: AC
  Administered 2020-06-26: 10 mL via INTRAVENOUS
  Filled 2020-06-26: qty 10

## 2020-06-26 MED ORDER — TECHNETIUM TC 99M TETROFOSMIN IV KIT
30.0000 | PACK | Freq: Once | INTRAVENOUS | Status: AC | PRN
  Administered 2020-06-26: 28 via INTRAVENOUS

## 2020-06-26 MED ORDER — TECHNETIUM TC 99M TETROFOSMIN IV KIT
10.0000 | PACK | Freq: Once | INTRAVENOUS | Status: AC | PRN
  Administered 2020-06-26: 9.5 via INTRAVENOUS

## 2020-06-26 MED ORDER — REGADENOSON 0.4 MG/5ML IV SOLN
INTRAVENOUS | Status: AC
  Administered 2020-06-26: 0.4 mg via INTRAVENOUS
  Filled 2020-06-26: qty 5

## 2020-07-03 ENCOUNTER — Encounter: Payer: Self-pay | Admitting: Student

## 2020-07-03 ENCOUNTER — Other Ambulatory Visit: Payer: Self-pay

## 2020-07-03 ENCOUNTER — Other Ambulatory Visit: Payer: Self-pay | Admitting: Student

## 2020-07-03 ENCOUNTER — Ambulatory Visit (INDEPENDENT_AMBULATORY_CARE_PROVIDER_SITE_OTHER): Payer: Medicare HMO | Admitting: Student

## 2020-07-03 VITALS — BP 132/86 | HR 67 | Ht 61.0 in | Wt 177.2 lb

## 2020-07-03 DIAGNOSIS — I5022 Chronic systolic (congestive) heart failure: Secondary | ICD-10-CM

## 2020-07-03 DIAGNOSIS — I480 Paroxysmal atrial fibrillation: Secondary | ICD-10-CM

## 2020-07-03 DIAGNOSIS — I350 Nonrheumatic aortic (valve) stenosis: Secondary | ICD-10-CM | POA: Diagnosis not present

## 2020-07-03 DIAGNOSIS — I5042 Chronic combined systolic (congestive) and diastolic (congestive) heart failure: Secondary | ICD-10-CM

## 2020-07-03 DIAGNOSIS — Z01818 Encounter for other preprocedural examination: Secondary | ICD-10-CM | POA: Diagnosis not present

## 2020-07-03 DIAGNOSIS — C9110 Chronic lymphocytic leukemia of B-cell type not having achieved remission: Secondary | ICD-10-CM | POA: Diagnosis not present

## 2020-07-03 DIAGNOSIS — R9439 Abnormal result of other cardiovascular function study: Secondary | ICD-10-CM

## 2020-07-03 NOTE — H&P (View-Only) (Signed)
Cardiology Office Note    Date:  07/04/2020   ID:  [name], DOB July 06, 1935, MRN [id number]  PCP:  Lemmie Evens, MD  Cardiologist: Dorris Carnes, MD    Chief Complaint  Patient presents with  . Follow-up    Review stress test results    History of Present Illness:    [name] is a 85 y.o. female with past medical history ofparoxysmal atrial fibrillation,chronic combined systolic and diastolic CHF (EF previously 55-60% in 06/2018, reduced to 40-45% by repeat echo in 03/2019 and at 35-40% by echo in 05/2020), aortic stenosis,LBBB,CLL and COPD who presents to the office today for review of her recent stress test.   She was last examined by myself in 05/2020 and recent echo had shown a reduced EF at 35 to 40% with anterior septal and anterior hypokinesis. She reported having dyspnea on exertion at baseline but had experienced intermittent episodes of chest pain over the past few months, therefore a stress test was recommended for further evaluation. Given her EF was at 35-40%, Cardizem was discontinued and Bisoprolol was titrated from 7.46m daily to 193mdaily. Her stress test showed findings consistent with prior inferior/inferoapical myocardial infarction with moderate peri-infarct ischemia and was an intermediate risk study.  In talking with the patient today, she reports still having dyspnea on exertion but denies any recurrent chest pain since her last visit. She does experience episodes of fatigue with associated dizziness at times. Denies any recent palpitations and feels like her symptoms have been well-controlled on Bisoprolol. No recent orthopnea, PND or edema.   Past Medical History:  Diagnosis Date  . Bowel obstruction ([facility]  . Breast cancer ([facility]  . Breast cancer, stage 3 ([facility]2/28/2013  . Bronchitis   . CHF (congestive heart failure) ([facility]  . CLL (chronic lymphocytic leukemia) (HCR[place]2/28/2013  . Migraines   . PAF (paroxysmal atrial fibrillation)  ([facility] [place]  . Pneumonia     Past Surgical History:  Procedure Laterality Date  . ABDOMINAL HYSTERECTOMY    . APPENDECTOMY    . CHOLECYSTECTOMY    . LAPAROTOMY N/A 06/08/2015   Procedure: EXPLORATORY LAPAROTOMY;  Surgeon: MaAviva SignsMD;  Location: [facility];  Service: General;  Laterality: N/A;  . LYSIS OF ADHESION N/A 06/08/2015   Procedure: LYSIS OF ADHESION;  Surgeon: MaAviva SignsMD;  Location: [facility];  Service: General;  Laterality: N/A;  . MASTECTOMY     right  . TOTAL HIP ARTHROPLASTY     right    Current Medications: Outpatient Medications Prior to Visit  Medication Sig Dispense Refill  . acetaminophen (TYLENOL) 500 MG tablet Take 500 mg by mouth every 6 (six) hours as needed for mild pain, moderate pain or headache.    . albuterol (PROVENTIL HFA;VENTOLIN HFA) 108 (90 BASE) MCG/ACT inhaler Inhale 2 puffs into the lungs every 6 (six) hours as needed for shortness of breath.     . Marland Kitchenlbuterol (PROVENTIL) (2.5 MG/3ML) 0.083% nebulizer solution Take 3 mLs (2.5 mg total) by nebulization every 6 (six) hours as needed for wheezing or shortness of breath (when not relieved by inhaler.). 75 mL 12  . apixaban (ELIQUIS) 5 MG TABS tablet Take 1 tablet (5 mg total) by mouth 2 (two) times daily. 180 tablet 1  . bisoprolol (ZEBETA) 10 MG tablet Take 1 tablet (10 mg total) by mouth daily. 90 tablet 3  . Blood Glucose Monitoring Suppl (ONBig BendLEX SYSTEM) w/Device KIT     .  ergocalciferol (VITAMIN D2) 1.25 MG (50000 UT) capsule Take 1 capsule (50,000 Units total) by mouth once a week. 12 capsule 1  . omeprazole (PRILOSEC) 20 MG capsule Take 20 mg by mouth at bedtime.     Marland Kitchen spironolactone (ALDACTONE) 25 MG tablet Take 0.5 tablets (12.5 mg total) by mouth daily. 45 tablet 1  . cholecalciferol (VITAMIN D) 1000 UNITS tablet Take 1,000 Units by mouth daily.    Marland Kitchen diltiazem (CARDIZEM) 30 MG tablet Take 1 tablet (30 mg total) by mouth 2 (two) times daily. 60 tablet 2  . Lancets (ONETOUCH DELICA PLUS  [id number]) MISC 2 (two) times daily. (Patient not taking: No sig reported)    . ONETOUCH VERIO test strip 2 (two) times daily. (Patient not taking: Reported on 06/18/2020)     No facility-administered medications prior to visit.     Allergies:   Codeine, Meclizine, and Sulfa antibiotics   Social History   Socioeconomic History  . Marital status: Widowed    Spouse name: Not on file  . Number of children: Not on file  . Years of education: 32  . Highest education level: Not on file  Occupational History  . Not on file  Tobacco Use  . Smoking status: Former Smoker    Quit date: 10/26/1978    Years since quitting: 41.7  . Smokeless tobacco: Never Used  Vaping Use  . Vaping Use: Never used  Substance and Sexual Activity  . Alcohol use: No  . Drug use: No  . Sexual activity: Not Currently  Other Topics Concern  . Not on file  Social History Narrative   Pt lives by herself   Social Determinants of Health   Financial Resource Strain: Low Risk   . Difficulty of Paying Living Expenses: Not hard at all  Food Insecurity: No Food Insecurity  . Worried About Charity fundraiser in the Last Year: Never true  . Ran Out of Food in the Last Year: Never true  Transportation Needs: No Transportation Needs  . Lack of Transportation (Medical): No  . Lack of Transportation (Non-Medical): No  Physical Activity: Inactive  . Days of Exercise per Week: 0 days  . Minutes of Exercise per Session: 0 min  Stress: Not on file  Social Connections: Socially Isolated  . Frequency of Communication with Friends and Family: More than three times a week  . Frequency of Social Gatherings with Friends and Family: Twice a week  . Attends Religious Services: Never  . Active Member of Clubs or Organizations: No  . Attends Archivist Meetings: Never  . Marital Status: Widowed     Family History:  The patient's family history includes Cancer in her brother and another family member.   Review of  Systems:   Please see the history of present illness.     General:  No chills, fever, night sweats or weight changes. Positive for fatigue.  Cardiovascular:  No chest pain, edema, orthopnea, palpitations, paroxysmal nocturnal dyspnea. Positive for dyspnea on exertion.  Dermatological: No rash, lesions/masses Respiratory: No cough, dyspnea Urologic: No hematuria, dysuria Abdominal:   No nausea, vomiting, diarrhea, bright red blood per rectum, melena, or hematemesis Neurologic:  No visual changes, wkns, changes in mental status. All other systems reviewed and are otherwise negative except as noted above.   Physical Exam:    VS:  BP 132/86   Pulse 67   Ht 5' 1"  (1.549 m)   Wt 177 lb 3.2 oz (80.4 kg)  SpO2 95%   BMI 33.48 kg/m    General: Well developed, well nourished,female appearing in no acute distress. Head: Normocephalic, atraumatic. Neck: No carotid bruits. JVD not elevated.  Lungs: Respirations regular and unlabored, without wheezes or rales.  Heart: Regular rate and rhythm. No S3 or S4.  2/6 SEM along RUSB.  Abdomen: Appears non-distended. No obvious abdominal masses. Msk:  Strength and tone appear normal for age. No obvious joint deformities or effusions. Extremities: No clubbing or cyanosis. No lower extremity edema.  Distal pedal pulses are 2+ bilaterally. Neuro: Alert and oriented X 3. Moves all extremities spontaneously. No focal deficits noted. Psych:  Responds to questions appropriately with a normal affect. Skin: No rashes or lesions noted  Wt Readings from Last 3 Encounters:  07/03/20 177 lb 3.2 oz (80.4 kg)  06/18/20 178 lb (80.7 kg)  04/15/20 181 lb 3.2 oz (82.2 kg)     Studies/Labs Reviewed:   EKG:  EKG is not ordered today.   Recent Labs: 01/15/2020: TSH 2.070 04/02/2020: ALT 29 07/03/2020: BUN 22; Creat 1.02; Hemoglobin 13.6; Platelets 224; Potassium 4.4; Sodium 142   Lipid Panel    Component Value Date/Time   CHOL 197 01/15/2020 1312   TRIG 134  01/15/2020 1312   HDL 43 01/15/2020 1312   CHOLHDL 4.6 01/15/2020 1312   VLDL 27 01/15/2020 1312   LDLCALC 127 (H) 01/15/2020 1312    Additional studies/ records that were reviewed today include:   Echocardiogram: 05/2020 IMPRESSIONS    1. Left ventricular ejection fraction, by estimation, is 35 to 40%. The  left ventricle has moderately decreased function. The left ventricle  demonstrates regional wall motion abnormalities (see scoring  diagram/findings for description). There is mild  left ventricular hypertrophy. Left ventricular diastolic parameters are  consistent with Grade I diastolic dysfunction (impaired relaxation).  Elevated left atrial pressure.  2. Right ventricular systolic function is normal. The right ventricular  size is normal. There is normal pulmonary artery systolic pressure.  3. Left atrial size was severely dilated.  4. The pericardial effusion is circumferential.  5. The mitral valve is normal in structure. No evidence of mitral valve  regurgitation. No evidence of mitral stenosis.  6. The aortic valve is tricuspid. There is moderate calcification of the  aortic valve. There is moderate thickening of the aortic valve. Aortic  valve regurgitation is not visualized. Mild to moderate aortic valve  stenosis.  7. The inferior vena cava is normal in size with greater than 50%  respiratory variability, suggesting right atrial pressure of 3 mmHg.    NST: 06/2020  There was no ST segment deviation noted during stress.  Findings consistent with prior inferior/inferoapical myocardial infarction with moderate peri-infarct ischemia.  This is an intermediate risk study.  The left ventricular ejection fraction is moderately decreased (30-44%).   Assessment:    1. Abnormal stress test   2. Chronic combined systolic and diastolic heart failure ([place])   3. Pre-op testing   4. Paroxysmal atrial fibrillation (HCC)   5. Nonrheumatic aortic valve stenosis    6. CLL (chronic lymphocytic leukemia) ([place])      Plan:   In order of problems listed above:  1. Abnormal Stress Test - Recent stress test showed findings consistent with prior inferior/inferoapical myocardial infarction with moderate peri-infarct ischemia and was an intermediate risk study. I reviewed this with Dr. Harrington Challenger and she recommended a cardiac catheterization for definitive evaluation. Reviewed in detail with the patient today and she is in agreement to  proceed (she reports having a prior cath in 2007 that was normal but I was unable to locate the report in EPIC).  - Will continue Bisoprolol 16m daily. Will not start ASA at this time given the use of anticoagulation but we reviewed today she may require additional medication adjustments pending her cath results. She has not been on statin therapy and LDL was at 127 in 12/2019 so if documented to have CAD at the time of her cath, would recommend initiation of Crestor 226mdaily with follow-up labs in 6-8 weeks. Will check pre-procedure CBC, BMET and COVID testing.   2. Chronic Combined Systolic and Diastolic CHF - Most recent echo showed her EF was further reduced at 35-40%. She does have dyspnea on exertion but denies any orthopnea, PND or edema. Will plan for a RHC as well at the time of her catheterization.  - Continue Bisoprolol 1090maily (previously intolerant to Toprol-XL) and Spironolactone. Will recheck pre-procedure BMET today. Following cath, would plan to add a low-dose ARB if renal function remains stable and BP allows.   3. Paroxysmal Atrial Fibrillation - She denies any recent palpitations. Remains on Bisoprolol 50m81mily. I did discontinue short-acting Cardizem at the time of her last visit due to further decline in her EF.  - She denies any evidence of active bleeding. Remains on Eliquis 5mg 60m for anticoagulation.   4. Aortic Stenosis - Mild to moderate by most recent echocardiogram. Will continue to follow.   5.  CLL - Followed by Oncology and her CLL has been stable by review of recent notes. Has routine labs every 6 months for reassessment.    Shared Decision Making/Informed Consent:   Shared Decision Making/Informed Consent The risks [stroke (1 in 1000), death (1 in 1000), kidney failure [usually temporary] (1 in 500), bleeding (1 in 200), allergic reaction [possibly serious] (1 in 200)], benefits (diagnostic support and management of coronary artery disease) and alternatives of a cardiac catheterization were discussed in detail with Ms. HunsuSilas Floodshe is willing to proceed.    Medication Adjustments/Labs and Tests Ordered: Current medicines are reviewed at length with the patient today.  Concerns regarding medicines are outlined above.  Medication changes, Labs and Tests ordered today are listed in the Patient Instructions below. Patient Instructions  Medication Instructions:  Your physician recommends that you continue on your current medications as directed. Please refer to the Current Medication list given to you today.  *If you need a refill on your cardiac medications before your next appointment, please call your pharmacy*   Lab Work: BMET CBC If you have labs (blood work) drawn today and your tests are completely normal, you will receive your results only by: . MyCMarland Kitchenart Message (if you have MyChart) OR . A paper copy in the mail If you have any lab test that is abnormal or we need to change your treatment, we will call you to review the results.   Testing/Procedures: Your physician has requested that you have a cardiac catheterization. Cardiac catheterization is used to diagnose and/or treat various heart conditions. Doctors may recommend this procedure for a number of different reasons. The most common reason is to evaluate chest pain. Chest pain can be a symptom of coronary artery disease (CAD), and cardiac catheterization can show whether plaque is narrowing or blocking your  heart's arteries. This procedure is also used to evaluate the valves, as well as measure the blood flow and oxygen levels in different parts of your heart. For further information  please visit [email or web address]. Please follow instruction sheet, as given.     Follow-Up: At [facility], you and your health needs are our priority.  As part of our continuing mission to provide you with exceptional heart care, we have created designated Provider Care Teams.  These Care Teams include your primary Cardiologist (physician) and Advanced Practice Providers (APPs -  Physician Assistants and Nurse Practitioners) who all work together to provide you with the care you need, when you need it.  We recommend signing up for the patient portal called "MyChart".  Sign up information is provided on this After Visit Summary.  MyChart is used to connect with patients for Virtual Visits (Telemedicine).  Patients are able to view lab/test results, encounter notes, upcoming appointments, etc.  Non-urgent messages can be sent to your provider as well.   To learn more about what you can do with MyChart, go to [email or web address].    Your next appointment:   2-3 week(s)  The format for your next appointment:   In Person  Provider:   Bernerd Pho, PA-C   Other Instructions COVID 19 TEST TO BE PREFORMED AT [place], [place], [place]  07/04/2020 10:08 AM    [place]. [address] [place], [place] [address] Phone: [phone] Fax: [phone]

## 2020-07-03 NOTE — Patient Instructions (Addendum)
Medication Instructions:  Your physician recommends that you continue on your current medications as directed. Please refer to the Current Medication list given to you today.  *If you need a refill on your cardiac medications before your next appointment, please call your pharmacy*   Lab Work: BMET CBC If you have labs (blood work) drawn today and your tests are completely normal, you will receive your results only by: Marland Kitchen MyChart Message (if you have MyChart) OR . A paper copy in the mail If you have any lab test that is abnormal or we need to change your treatment, we will call you to review the results.   Testing/Procedures: Your physician has requested that you have a cardiac catheterization. Cardiac catheterization is used to diagnose and/or treat various heart conditions. Doctors may recommend this procedure for a number of different reasons. The most common reason is to evaluate chest pain. Chest pain can be a symptom of coronary artery disease (CAD), and cardiac catheterization can show whether plaque is narrowing or blocking your heart's arteries. This procedure is also used to evaluate the valves, as well as measure the blood flow and oxygen levels in different parts of your heart. For further information please visit [email or web address]. Please follow instruction sheet, as given.     Follow-Up: At [facility], you and your health needs are our priority.  As part of our continuing mission to provide you with exceptional heart care, we have created designated Provider Care Teams.  These Care Teams include your primary Cardiologist (physician) and Advanced Practice Providers (APPs -  Physician Assistants and Nurse Practitioners) who all work together to provide you with the care you need, when you need it.  We recommend signing up for the patient portal called "MyChart".  Sign up information is provided on this After Visit Summary.  MyChart is used to connect with patients for Virtual  Visits (Telemedicine).  Patients are able to view lab/test results, encounter notes, upcoming appointments, etc.  Non-urgent messages can be sent to your provider as well.   To learn more about what you can do with MyChart, go to [email or web address].    Your next appointment:   2-3 week(s)  The format for your next appointment:   In Person  Provider:   Bernerd Pho, PA-C   Other Instructions COVID 19 TEST TO BE PREFORMED AT [facility]

## 2020-07-03 NOTE — Progress Notes (Signed)
Cardiology Office Note    Date:  07/04/2020   ID:  [name], DOB [birth year], MRN [id number]  PCP:  Lemmie Evens, MD  Cardiologist: Dorris Carnes, MD    Chief Complaint  Patient presents with  . Follow-up    Review stress test results    History of Present Illness:    [name] is a 85 y.o. female with past medical history ofparoxysmal atrial fibrillation,chronic combined systolic and diastolic CHF (EF previously 55-60% in 06/2018, reduced to 40-45% by repeat echo in 03/2019 and at 35-40% by echo in 05/2020), aortic stenosis,LBBB,CLL and COPD who presents to the office today for review of her recent stress test.   She was last examined by myself in 05/2020 and recent echo had shown a reduced EF at 35 to 40% with anterior septal and anterior hypokinesis. She reported having dyspnea on exertion at baseline but had experienced intermittent episodes of chest pain over the past few months, therefore a stress test was recommended for further evaluation. Given her EF was at 35-40%, Cardizem was discontinued and Bisoprolol was titrated from 7.22m daily to 127mdaily. Her stress test showed findings consistent with prior inferior/inferoapical myocardial infarction with moderate peri-infarct ischemia and was an intermediate risk study.  In talking with the patient today, she reports still having dyspnea on exertion but denies any recurrent chest pain since her last visit. She does experience episodes of fatigue with associated dizziness at times. Denies any recent palpitations and feels like her symptoms have been well-controlled on Bisoprolol. No recent orthopnea, PND or edema.   Past Medical History:  Diagnosis Date  . Bowel obstruction ([facility]  . Breast cancer ([facility]  . Breast cancer, stage 3 ([facility]/28/2013  . Bronchitis   . CHF (congestive heart failure) ([facility]  . CLL (chronic lymphocytic leukemia) ([facility]2/28/2013  . Migraines   . PAF (paroxysmal atrial fibrillation)  ([facility]  . Pneumonia     Past Surgical History:  Procedure Laterality Date  . ABDOMINAL HYSTERECTOMY    . APPENDECTOMY    . CHOLECYSTECTOMY    . LAPAROTOMY N/A 06/08/2015   Procedure: EXPLORATORY LAPAROTOMY;  Surgeon: MaAviva SignsMD;  Location: [facility];  Service: General;  Laterality: N/A;  . LYSIS OF ADHESION N/A 06/08/2015   Procedure: LYSIS OF ADHESION;  Surgeon: MaAviva SignsMD;  Location: [facility];  Service: General;  Laterality: N/A;  . MASTECTOMY     right  . TOTAL HIP ARTHROPLASTY     right    Current Medications: Outpatient Medications Prior to Visit  Medication Sig Dispense Refill  . acetaminophen (TYLENOL) 500 MG tablet Take 500 mg by mouth every 6 (six) hours as needed for mild pain, moderate pain or headache.    . albuterol (PROVENTIL HFA;VENTOLIN HFA) 108 (90 BASE) MCG/ACT inhaler Inhale 2 puffs into the lungs every 6 (six) hours as needed for shortness of breath.     . Marland Kitchenlbuterol (PROVENTIL) (2.5 MG/3ML) 0.083% nebulizer solution Take 3 mLs (2.5 mg total) by nebulization every 6 (six) hours as needed for wheezing or shortness of breath (when not relieved by inhaler.). 75 mL 12  . apixaban (ELIQUIS) 5 MG TABS tablet Take 1 tablet (5 mg total) by mouth 2 (two) times daily. 180 tablet 1  . bisoprolol (ZEBETA) 10 MG tablet Take 1 tablet (10 mg total) by mouth daily. 90 tablet 3  . Blood Glucose Monitoring Suppl (ONMardela SpringsLEX SYSTEM) w/Device KIT     .  ergocalciferol (VITAMIN D2) 1.25 MG (50000 UT) capsule Take 1 capsule (50,000 Units total) by mouth once a week. 12 capsule 1  . omeprazole (PRILOSEC) 20 MG capsule Take 20 mg by mouth at bedtime.     Marland Kitchen spironolactone (ALDACTONE) 25 MG tablet Take 0.5 tablets (12.5 mg total) by mouth daily. 45 tablet 1  . cholecalciferol (VITAMIN D) 1000 UNITS tablet Take 1,000 Units by mouth daily.    Marland Kitchen diltiazem (CARDIZEM) 30 MG tablet Take 1 tablet (30 mg total) by mouth 2 (two) times daily. 60 tablet 2  . Lancets (ONETOUCH DELICA PLUS  [id number]) MISC 2 (two) times daily. (Patient not taking: No sig reported)    . ONETOUCH VERIO test strip 2 (two) times daily. (Patient not taking: Reported on 06/18/2020)     No facility-administered medications prior to visit.     Allergies:   Codeine, Meclizine, and Sulfa antibiotics   Social History   Socioeconomic History  . Marital status: Widowed    Spouse name: Not on file  . Number of children: Not on file  . Years of education: 71  . Highest education level: Not on file  Occupational History  . Not on file  Tobacco Use  . Smoking status: Former Smoker    Quit date: 10/26/1978    Years since quitting: 41.7  . Smokeless tobacco: Never Used  Vaping Use  . Vaping Use: Never used  Substance and Sexual Activity  . Alcohol use: No  . Drug use: No  . Sexual activity: Not Currently  Other Topics Concern  . Not on file  Social History Narrative   Pt lives by herself   Social Determinants of Health   Financial Resource Strain: Low Risk   . Difficulty of Paying Living Expenses: Not hard at all  Food Insecurity: No Food Insecurity  . Worried About Charity fundraiser in the Last Year: Never true  . Ran Out of Food in the Last Year: Never true  Transportation Needs: No Transportation Needs  . Lack of Transportation (Medical): No  . Lack of Transportation (Non-Medical): No  Physical Activity: Inactive  . Days of Exercise per Week: 0 days  . Minutes of Exercise per Session: 0 min  Stress: Not on file  Social Connections: Socially Isolated  . Frequency of Communication with Friends and Family: More than three times a week  . Frequency of Social Gatherings with Friends and Family: Twice a week  . Attends Religious Services: Never  . Active Member of Clubs or Organizations: No  . Attends Archivist Meetings: Never  . Marital Status: Widowed     Family History:  The patient's family history includes Cancer in her brother and another family member.   Review of  Systems:   Please see the history of present illness.     General:  No chills, fever, night sweats or weight changes. Positive for fatigue.  Cardiovascular:  No chest pain, edema, orthopnea, palpitations, paroxysmal nocturnal dyspnea. Positive for dyspnea on exertion.  Dermatological: No rash, lesions/masses Respiratory: No cough, dyspnea Urologic: No hematuria, dysuria Abdominal:   No nausea, vomiting, diarrhea, bright red blood per rectum, melena, or hematemesis Neurologic:  No visual changes, wkns, changes in mental status. All other systems reviewed and are otherwise negative except as noted above.   Physical Exam:    VS:  BP 132/86   Pulse 67   Ht 5' 1"  (1.549 m)   Wt 177 lb 3.2 oz (80.4 kg)  SpO2 95%   BMI 33.48 kg/m    General: Well developed, well nourished,female appearing in no acute distress. Head: Normocephalic, atraumatic. Neck: No carotid bruits. JVD not elevated.  Lungs: Respirations regular and unlabored, without wheezes or rales.  Heart: Regular rate and rhythm. No S3 or S4.  2/6 SEM along RUSB.  Abdomen: Appears non-distended. No obvious abdominal masses. Msk:  Strength and tone appear normal for age. No obvious joint deformities or effusions. Extremities: No clubbing or cyanosis. No lower extremity edema.  Distal pedal pulses are 2+ bilaterally. Neuro: Alert and oriented X 3. Moves all extremities spontaneously. No focal deficits noted. Psych:  Responds to questions appropriately with a normal affect. Skin: No rashes or lesions noted  Wt Readings from Last 3 Encounters:  07/03/20 177 lb 3.2 oz (80.4 kg)  06/18/20 178 lb (80.7 kg)  04/15/20 181 lb 3.2 oz (82.2 kg)     Studies/Labs Reviewed:   EKG:  EKG is not ordered today.   Recent Labs: 01/15/2020: TSH 2.070 04/02/2020: ALT 29 07/03/2020: BUN 22; Creat 1.02; Hemoglobin 13.6; Platelets 224; Potassium 4.4; Sodium 142   Lipid Panel    Component Value Date/Time   CHOL 197 01/15/2020 1312   TRIG 134  01/15/2020 1312   HDL 43 01/15/2020 1312   CHOLHDL 4.6 01/15/2020 1312   VLDL 27 01/15/2020 1312   LDLCALC 127 (H) 01/15/2020 1312    Additional studies/ records that were reviewed today include:   Echocardiogram: 05/2020 IMPRESSIONS    1. Left ventricular ejection fraction, by estimation, is 35 to 40%. The  left ventricle has moderately decreased function. The left ventricle  demonstrates regional wall motion abnormalities (see scoring  diagram/findings for description). There is mild  left ventricular hypertrophy. Left ventricular diastolic parameters are  consistent with Grade I diastolic dysfunction (impaired relaxation).  Elevated left atrial pressure.  2. Right ventricular systolic function is normal. The right ventricular  size is normal. There is normal pulmonary artery systolic pressure.  3. Left atrial size was severely dilated.  4. The pericardial effusion is circumferential.  5. The mitral valve is normal in structure. No evidence of mitral valve  regurgitation. No evidence of mitral stenosis.  6. The aortic valve is tricuspid. There is moderate calcification of the  aortic valve. There is moderate thickening of the aortic valve. Aortic  valve regurgitation is not visualized. Mild to moderate aortic valve  stenosis.  7. The inferior vena cava is normal in size with greater than 50%  respiratory variability, suggesting right atrial pressure of 3 mmHg.    NST: 06/2020  There was no ST segment deviation noted during stress.  Findings consistent with prior inferior/inferoapical myocardial infarction with moderate peri-infarct ischemia.  This is an intermediate risk study.  The left ventricular ejection fraction is moderately decreased (30-44%).   Assessment:    1. Abnormal stress test   2. Chronic combined systolic and diastolic heart failure ([place])   3. Pre-op testing   4. Paroxysmal atrial fibrillation (HCC)   5. Nonrheumatic aortic valve stenosis    6. CLL (chronic lymphocytic leukemia) ([place])      Plan:   In order of problems listed above:  1. Abnormal Stress Test - Recent stress test showed findings consistent with prior inferior/inferoapical myocardial infarction with moderate peri-infarct ischemia and was an intermediate risk study. I reviewed this with Dr. Harrington Challenger and she recommended a cardiac catheterization for definitive evaluation. Reviewed in detail with the patient today and she is in agreement to  proceed (she reports having a prior cath in 2007 that was normal but I was unable to locate the report in EPIC).  - Will continue Bisoprolol 36m daily. Will not start ASA at this time given the use of anticoagulation but we reviewed today she may require additional medication adjustments pending her cath results. She has not been on statin therapy and LDL was at 127 in 12/2019 so if documented to have CAD at the time of her cath, would recommend initiation of Crestor 277mdaily with follow-up labs in 6-8 weeks. Will check pre-procedure CBC, BMET and COVID testing.   2. Chronic Combined Systolic and Diastolic CHF - Most recent echo showed her EF was further reduced at 35-40%. She does have dyspnea on exertion but denies any orthopnea, PND or edema. Will plan for a RHC as well at the time of her catheterization.  - Continue Bisoprolol 1066maily (previously intolerant to Toprol-XL) and Spironolactone. Will recheck pre-procedure BMET today. Following cath, would plan to add a low-dose ARB if renal function remains stable and BP allows.   3. Paroxysmal Atrial Fibrillation - She denies any recent palpitations. Remains on Bisoprolol 79m73mily. I did discontinue short-acting Cardizem at the time of her last visit due to further decline in her EF.  - She denies any evidence of active bleeding. Remains on Eliquis 5mg 18m for anticoagulation.   4. Aortic Stenosis - Mild to moderate by most recent echocardiogram. Will continue to follow.   5.  CLL - Followed by Oncology and her CLL has been stable by review of recent notes. Has routine labs every 6 months for reassessment.    Shared Decision Making/Informed Consent:   Shared Decision Making/Informed Consent The risks [stroke (1 in 1000), death (1 in 1000), kidney failure [usually temporary] (1 in 500), bleeding (1 in 200), allergic reaction [possibly serious] (1 in 200)], benefits (diagnostic support and management of coronary artery disease) and alternatives of a cardiac catheterization were discussed in detail with Ms. HunsuSilas Floodshe is willing to proceed.    Medication Adjustments/Labs and Tests Ordered: Current medicines are reviewed at length with the patient today.  Concerns regarding medicines are outlined above.  Medication changes, Labs and Tests ordered today are listed in the Patient Instructions below. Patient Instructions  Medication Instructions:  Your physician recommends that you continue on your current medications as directed. Please refer to the Current Medication list given to you today.  *If you need a refill on your cardiac medications before your next appointment, please call your pharmacy*   Lab Work: BMET CBC If you have labs (blood work) drawn today and your tests are completely normal, you will receive your results only by: . MyCMarland Kitchenart Message (if you have MyChart) OR . A paper copy in the mail If you have any lab test that is abnormal or we need to change your treatment, we will call you to review the results.   Testing/Procedures: Your physician has requested that you have a cardiac catheterization. Cardiac catheterization is used to diagnose and/or treat various heart conditions. Doctors may recommend this procedure for a number of different reasons. The most common reason is to evaluate chest pain. Chest pain can be a symptom of coronary artery disease (CAD), and cardiac catheterization can show whether plaque is narrowing or blocking your  heart's arteries. This procedure is also used to evaluate the valves, as well as measure the blood flow and oxygen levels in different parts of your heart. For further information  please visit [email or web address]. Please follow instruction sheet, as given.     Follow-Up: At [facility], you and your health needs are our priority.  As part of our continuing mission to provide you with exceptional heart care, we have created designated Provider Care Teams.  These Care Teams include your primary Cardiologist (physician) and Advanced Practice Providers (APPs -  Physician Assistants and Nurse Practitioners) who all work together to provide you with the care you need, when you need it.  We recommend signing up for the patient portal called "MyChart".  Sign up information is provided on this After Visit Summary.  MyChart is used to connect with patients for Virtual Visits (Telemedicine).  Patients are able to view lab/test results, encounter notes, upcoming appointments, etc.  Non-urgent messages can be sent to your provider as well.   To learn more about what you can do with MyChart, go to [email or web address].    Your next appointment:   2-3 week(s)  The format for your next appointment:   In Person  Provider:   Bernerd Pho, PA-C   Other Instructions COVID 19 TEST TO BE PREFORMED AT [facility], Erma Heritage, [place]  07/04/2020 10:08 AM    [facility]. [address] [place], [place] [address] Phone: [phone] Fax: [phone]

## 2020-07-04 ENCOUNTER — Encounter: Payer: Self-pay | Admitting: Student

## 2020-07-04 LAB — CBC
HCT: 41 % (ref 35.0–45.0)
Hemoglobin: 13.6 g/dL (ref 11.7–15.5)
MCH: 30 pg (ref 27.0–33.0)
MCHC: 33.2 g/dL (ref 32.0–36.0)
MCV: 90.5 fL (ref 80.0–100.0)
MPV: 11.5 fL (ref 7.5–12.5)
Platelets: 224 10*3/uL (ref 140–400)
RBC: 4.53 10*6/uL (ref 3.80–5.10)
RDW: 13.4 % (ref 11.0–15.0)
WBC: 45.5 10*3/uL — ABNORMAL HIGH (ref 3.8–10.8)

## 2020-07-04 LAB — BASIC METABOLIC PANEL WITH GFR
BUN/Creatinine Ratio: 22 (calc) (ref 6–22)
BUN: 22 mg/dL (ref 7–25)
CO2: 25 mmol/L (ref 20–32)
Calcium: 9.6 mg/dL (ref 8.6–10.4)
Chloride: 105 mmol/L (ref 98–110)
Creat: 1.02 mg/dL — ABNORMAL HIGH (ref 0.60–0.88)
GFR, Est African American: 59 mL/min/{1.73_m2} — ABNORMAL LOW (ref >=60)
GFR, Est Non African American: 51 mL/min/{1.73_m2} — ABNORMAL LOW (ref >=60)
Glucose, Bld: 82 mg/dL (ref 65–99)
Potassium: 4.4 mmol/L (ref 3.5–5.3)
Sodium: 142 mmol/L (ref 135–146)

## 2020-07-07 ENCOUNTER — Other Ambulatory Visit (HOSPITAL_COMMUNITY): Payer: Medicare HMO

## 2020-07-07 ENCOUNTER — Other Ambulatory Visit: Payer: Self-pay

## 2020-07-07 ENCOUNTER — Telehealth: Payer: Self-pay | Admitting: *Deleted

## 2020-07-07 ENCOUNTER — Other Ambulatory Visit (HOSPITAL_COMMUNITY)
Admission: RE | Admit: 2020-07-07 | Discharge: 2020-07-07 | Disposition: A | Discharge status: Home / Self Care | Payer: Medicare HMO | Source: Ambulatory Visit | Attending: Internal Medicine | Admitting: Internal Medicine

## 2020-07-07 DIAGNOSIS — Z01812 Encounter for preprocedural laboratory examination: Secondary | ICD-10-CM | POA: Diagnosis not present

## 2020-07-07 DIAGNOSIS — Z20822 Contact with and (suspected) exposure to covid-19: Secondary | ICD-10-CM | POA: Insufficient documentation

## 2020-07-07 LAB — SARS CORONAVIRUS 2 (TAT 6-24 HRS): SARS Coronavirus 2: NEGATIVE

## 2020-07-07 NOTE — Telephone Encounter (Signed)
Pt contacted pre-catheterization scheduled at [facility] for: Thursday July 09, 2020 10:30 AM Verified arrival time and place: [facility]) at: 8:30 AM   No solid food after midnight prior to cath, clear liquids until 5 AM day of procedure.  Hold: Eliquis-none 07/07/20 until post procedure  Spironolactone-day before and day of procedure-GFR 51  Except hold AM meds can be  taken pre-cath with sips of water including: ASA 81 mg   Confirmed patient has responsible adult to drive home post procedure and be with patient first 24 hours after arriving home: yes  You are allowed ONE visitor in the waiting room during the time you are at the hospital for your procedure. Both you and your visitor must wear a mask once you enter the hospital.

## 2020-07-08 ENCOUNTER — Telehealth: Payer: Self-pay | Admitting: Internal Medicine

## 2020-07-08 NOTE — Telephone Encounter (Signed)
New message    Patient returning call to [facility] for test results ?

## 2020-07-08 NOTE — Telephone Encounter (Signed)
Spoke with pt and notified that all test results were given on yesterday. Pt stated that she was just reviewing her voicemail and wanted to call back.

## 2020-07-09 ENCOUNTER — Other Ambulatory Visit: Payer: Self-pay

## 2020-07-09 ENCOUNTER — Ambulatory Visit (HOSPITAL_COMMUNITY)
Admission: RE | Admit: 2020-07-09 | Discharge: 2020-07-09 | Disposition: A | Discharge status: Home / Self Care | Payer: Medicare HMO | Attending: Internal Medicine | Admitting: Internal Medicine

## 2020-07-09 ENCOUNTER — Encounter (HOSPITAL_COMMUNITY)
Admission: RE | Disposition: A | Discharge status: Home / Self Care | Payer: Self-pay | Source: Home / Self Care | Attending: Internal Medicine

## 2020-07-09 DIAGNOSIS — I2510 Atherosclerotic heart disease of native coronary artery without angina pectoris: Secondary | ICD-10-CM | POA: Diagnosis not present

## 2020-07-09 DIAGNOSIS — I480 Paroxysmal atrial fibrillation: Secondary | ICD-10-CM | POA: Insufficient documentation

## 2020-07-09 DIAGNOSIS — Z882 Allergy status to sulfonamides status: Secondary | ICD-10-CM | POA: Insufficient documentation

## 2020-07-09 DIAGNOSIS — R9439 Abnormal result of other cardiovascular function study: Secondary | ICD-10-CM | POA: Diagnosis present

## 2020-07-09 DIAGNOSIS — I5043 Acute on chronic combined systolic (congestive) and diastolic (congestive) heart failure: Secondary | ICD-10-CM | POA: Insufficient documentation

## 2020-07-09 DIAGNOSIS — Z87891 Personal history of nicotine dependence: Secondary | ICD-10-CM | POA: Insufficient documentation

## 2020-07-09 DIAGNOSIS — I350 Nonrheumatic aortic (valve) stenosis: Secondary | ICD-10-CM | POA: Diagnosis not present

## 2020-07-09 DIAGNOSIS — C9110 Chronic lymphocytic leukemia of B-cell type not having achieved remission: Secondary | ICD-10-CM | POA: Diagnosis not present

## 2020-07-09 DIAGNOSIS — Z885 Allergy status to narcotic agent status: Secondary | ICD-10-CM | POA: Diagnosis not present

## 2020-07-09 DIAGNOSIS — Z7901 Long term (current) use of anticoagulants: Secondary | ICD-10-CM | POA: Insufficient documentation

## 2020-07-09 DIAGNOSIS — I5023 Acute on chronic systolic (congestive) heart failure: Secondary | ICD-10-CM

## 2020-07-09 DIAGNOSIS — Z79899 Other long term (current) drug therapy: Secondary | ICD-10-CM | POA: Diagnosis not present

## 2020-07-09 HISTORY — PX: RIGHT/LEFT HEART CATH AND CORONARY ANGIOGRAPHY: CATH118266

## 2020-07-09 LAB — POCT I-STAT 7, (LYTES, BLD GAS, ICA,H+H)
Acid-base deficit: 3 mmol/L — ABNORMAL HIGH (ref 0.0–2.0)
Bicarbonate: 21.5 mmol/L (ref 20.0–28.0)
Calcium, Ion: 1.19 mmol/L (ref 1.15–1.40)
HCT: 35 % — ABNORMAL LOW (ref 36.0–46.0)
Hemoglobin: 11.9 g/dL — ABNORMAL LOW (ref 12.0–15.0)
O2 Saturation: 96 %
Potassium: 3.5 mmol/L (ref 3.5–5.1)
Sodium: 144 mmol/L (ref 135–145)
TCO2: 22 mmol/L (ref 22–32)
pCO2 arterial: 33.8 mmHg (ref 32.0–48.0)
pH, Arterial: 7.411 (ref 7.350–7.450)
pO2, Arterial: 79 mmHg — ABNORMAL LOW (ref 83.0–108.0)

## 2020-07-09 LAB — POCT I-STAT EG7
Acid-base deficit: 1 mmol/L (ref 0.0–2.0)
Bicarbonate: 23.4 mmol/L (ref 20.0–28.0)
Calcium, Ion: 1.19 mmol/L (ref 1.15–1.40)
HCT: 35 % — ABNORMAL LOW (ref 36.0–46.0)
Hemoglobin: 11.9 g/dL — ABNORMAL LOW (ref 12.0–15.0)
O2 Saturation: 70 %
Potassium: 3.5 mmol/L (ref 3.5–5.1)
Sodium: 144 mmol/L (ref 135–145)
TCO2: 25 mmol/L (ref 22–32)
pCO2, Ven: 38.2 mmHg — ABNORMAL LOW (ref 44.0–60.0)
pH, Ven: 7.395 (ref 7.250–7.430)
pO2, Ven: 36 mmHg (ref 32.0–45.0)

## 2020-07-09 SURGERY — RIGHT/LEFT HEART CATH AND CORONARY ANGIOGRAPHY
Anesthesia: LOCAL

## 2020-07-09 MED ORDER — HEPARIN SODIUM (PORCINE) 1000 UNIT/ML IJ SOLN
INTRAMUSCULAR | Status: AC
  Filled 2020-07-09: qty 1

## 2020-07-09 MED ORDER — HYDRALAZINE HCL 20 MG/ML IJ SOLN: 10.0000 mg | INTRAMUSCULAR | Status: DC | PRN

## 2020-07-09 MED ORDER — SODIUM CHLORIDE 0.9% FLUSH: 3.0000 mL | Freq: Two times a day (BID) | INTRAVENOUS | Status: DC

## 2020-07-09 MED ORDER — IOHEXOL 350 MG/ML SOLN
INTRAVENOUS | Status: DC | PRN
  Administered 2020-07-09: 60 mL

## 2020-07-09 MED ORDER — HEPARIN (PORCINE) IN NACL 1000-0.9 UT/500ML-% IV SOLN
INTRAVENOUS | Status: AC
  Filled 2020-07-09: qty 1000

## 2020-07-09 MED ORDER — SODIUM CHLORIDE 0.9 % IV SOLN: 250.0000 mL | INTRAVENOUS | Status: DC | PRN

## 2020-07-09 MED ORDER — SODIUM CHLORIDE 0.9% FLUSH: 3.0000 mL | INTRAVENOUS | Status: DC | PRN

## 2020-07-09 MED ORDER — SODIUM CHLORIDE 0.9 % IV SOLN: INTRAVENOUS | Status: DC

## 2020-07-09 MED ORDER — MIDAZOLAM HCL 2 MG/2ML IJ SOLN
INTRAMUSCULAR | Status: AC
  Filled 2020-07-09: qty 2

## 2020-07-09 MED ORDER — FENTANYL CITRATE (PF) 100 MCG/2ML IJ SOLN
INTRAMUSCULAR | Status: AC
  Filled 2020-07-09: qty 2

## 2020-07-09 MED ORDER — ASPIRIN 81 MG PO CHEW: 81.0000 mg | CHEWABLE_TABLET | ORAL | Status: DC

## 2020-07-09 MED ORDER — ONDANSETRON HCL 4 MG/2ML IJ SOLN: 4.0000 mg | Freq: Four times a day (QID) | INTRAMUSCULAR | Status: DC | PRN

## 2020-07-09 MED ORDER — VERAPAMIL HCL 2.5 MG/ML IV SOLN
INTRAVENOUS | Status: DC | PRN
  Administered 2020-07-09: 10 mL via INTRA_ARTERIAL

## 2020-07-09 MED ORDER — SODIUM CHLORIDE 0.9 % IV SOLN
INTRAVENOUS | Status: DC
Start: 2020-07-09 — End: 2020-07-09

## 2020-07-09 MED ORDER — LIDOCAINE HCL (PF) 1 % IJ SOLN
INTRAMUSCULAR | Status: DC | PRN
  Administered 2020-07-09 (×2): 2 mL via INTRADERMAL

## 2020-07-09 MED ORDER — VERAPAMIL HCL 2.5 MG/ML IV SOLN
INTRAVENOUS | Status: AC
  Filled 2020-07-09: qty 2

## 2020-07-09 MED ORDER — HEPARIN SODIUM (PORCINE) 1000 UNIT/ML IJ SOLN
INTRAMUSCULAR | Status: DC | PRN
  Administered 2020-07-09: 4000 [IU] via INTRAVENOUS

## 2020-07-09 MED ORDER — LIDOCAINE HCL (PF) 1 % IJ SOLN
INTRAMUSCULAR | Status: AC
  Filled 2020-07-09: qty 30

## 2020-07-09 MED ORDER — HEPARIN (PORCINE) IN NACL 1000-0.9 UT/500ML-% IV SOLN
INTRAVENOUS | Status: DC | PRN
  Administered 2020-07-09 (×2): 500 mL

## 2020-07-09 MED ORDER — ACETAMINOPHEN 325 MG PO TABS: 650.0000 mg | ORAL_TABLET | ORAL | Status: DC | PRN

## 2020-07-09 SURGICAL SUPPLY — 13 items
CATH BALLN WEDGE 5F 110CM (CATHETERS) ×1 IMPLANT
CATH INFINITI 5FR MULTPACK ANG (CATHETERS) ×2 IMPLANT
DEVICE RAD COMP TR BAND LRG (VASCULAR PRODUCTS) ×1 IMPLANT
ELECT DEFIB PAD ADLT CADENCE (PAD) ×1 IMPLANT
GLIDESHEATH SLEND A-KIT 6F 22G (SHEATH) ×1 IMPLANT
GUIDEWIRE INQWIRE 1.5J.035X260 (WIRE) IMPLANT
INQWIRE 1.5J .035X260CM (WIRE) ×2
KIT HEART LEFT (KITS) ×2 IMPLANT
PACK CARDIAC CATHETERIZATION (CUSTOM PROCEDURE TRAY) ×2 IMPLANT
SHEATH GLIDE SLENDER 4/5FR (SHEATH) ×1 IMPLANT
TRANSDUCER W/STOPCOCK (MISCELLANEOUS) ×2 IMPLANT
TUBING CIL FLEX 10 FLL-RA (TUBING) ×2 IMPLANT
WIRE HI TORQ VERSACORE-J 145CM (WIRE) ×1 IMPLANT

## 2020-07-09 NOTE — Brief Op Note (Signed)
BRIEF CARDIAC CATHETERIZATION NOTE  07/09/2020  12:03 PM  PATIENT:  [name]  85 y.o. female  PRE-OPERATIVE DIAGNOSIS:  Acute on chronic HFrEF and abnormal stress test  POST-OPERATIVE DIAGNOSIS:  Nonischemic cardiomyoapthy  PROCEDURE:  Procedure(s): RIGHT/LEFT HEART CATH AND CORONARY ANGIOGRAPHY (N/A)  SURGEON:  Surgeon(s) and Role:    * Berdie Malter, MD - Primary  FINDINGS: 1. Non-obstructive coronary artery disease. 2. Upper normal left and right heart filling pressures. 3. Mild aortic stenosis.  RECOMMENDATIONS: 1. Medical therapy and risk factor modification to prevent progression of CAD. 2. Escalate GDMT for NICM, as tolerated. 3. Restart apixaban tomorrow if no evidence of bleeding/vascular complication.  Nelva Bush, MD [facility]

## 2020-07-09 NOTE — Interval H&P Note (Signed)
History and Physical Interval Note:  07/09/2020 10:46 AM  [name]  has presented today for surgery, with the diagnosis of acute on chronic HFrEF and abnornal stress test.  The various methods of treatment have been discussed with the patient and family. After consideration of risks, benefits and other options for treatment, the patient has consented to  Procedure(s): RIGHT/LEFT HEART CATH AND CORONARY ANGIOGRAPHY (N/A) as a surgical intervention.  The patient's history has been reviewed, patient examined, no change in status, stable for surgery.  I have reviewed the patient's chart and labs.  Questions were answered to the patient's satisfaction.    Cath Lab Visit (complete for each Cath Lab visit)  Clinical Evaluation Leading to the Procedure:   ACS: No.  Non-ACS:    Anginal/Heart Failure Classification: NYHA Class III  Anti-ischemic medical therapy: Minimal Therapy (1 class of medications)  Non-Invasive Test Results: Intermediate-risk stress test findings: cardiac mortality 1-3%/year  Prior CABG: No previous CABG  Shirline Kendle

## 2020-07-09 NOTE — Discharge Instructions (Signed)
Drink plenty of fluids for 48 hours and keep wrist elevated at heart level for 24 hours  Radial Site Care   This sheet gives you information about how to care for yourself after your procedure. Your health care provider may also give you more specific instructions. If you have problems or questions, contact your health care provider. What can I expect after the procedure? After the procedure, it is common to have:  Bruising and tenderness at the catheter insertion area. Follow these instructions at home: Medicines  Take over-the-counter and prescription medicines only as told by your health care provider. Insertion site care 1. Follow instructions from your health care provider about how to take care of your insertion site. Make sure you: ? Wash your hands with soap and water before you change your bandage (dressing). If soap and water are not available, use hand sanitizer. ? Remove your dressing as told by your health care provider. In 24 hours 2. Check your insertion site every day for signs of infection. Check for: ? Redness, swelling, or pain. ? Fluid or blood. ? Pus or a bad smell. ? Warmth. 3. Do not take baths, swim, or use a hot tub until your health care provider approves. 4. You may shower 24-48 hours after the procedure, or as directed by your health care provider. ? Remove the dressing and gently wash the site with plain soap and water. ? Pat the area dry with a clean towel. ? Do not rub the site. That could cause bleeding. 5. Do not apply powder or lotion to the site. Activity   1. For 24 hours after the procedure, or as directed by your health care provider: ? Do not flex or bend the affected arm. ? Do not push or pull heavy objects with the affected arm. ? Do not drive yourself home from the hospital or clinic. You may drive 24 hours after the procedure unless your health care provider tells you not to. ? Do not operate machinery or power tools. 2. Do not lift  anything that is heavier than 10 lb (4.5 kg), or the limit that you are told, until your health care provider says that it is safe.  For 4 days 3. Ask your health care provider when it is okay to: ? Return to work or school. ? Resume usual physical activities or sports. ? Resume sexual activity. General instructions  If the catheter site starts to bleed, raise your arm and put firm pressure on the site. If the bleeding does not stop, get help right away. This is a medical emergency.  If you went home on the same day as your procedure, a responsible adult should be with you for the first 24 hours after you arrive home.  Keep all follow-up visits as told by your health care provider. This is important. Contact a health care provider if:  You have a fever.  You have redness, swelling, or yellow drainage around your insertion site. Get help right away if:  You have unusual pain at the radial site.  The catheter insertion area swells very fast.  The insertion area is bleeding, and the bleeding does not stop when you hold steady pressure on the area.  Your arm or hand becomes pale, cool, tingly, or numb. These symptoms may represent a serious problem that is an emergency. Do not wait to see if the symptoms will go away. Get medical help right away. Call your local emergency services (911 in the [place] Do   not drive yourself to the hospital. Summary  After the procedure, it is common to have bruising and tenderness at the site.  Follow instructions from your health care provider about how to take care of your radial site wound. Check the wound every day for signs of infection.  Do not lift anything that is heavier than 10 lb (4.5 kg), or the limit that you are told, until your health care provider says that it is safe. This information is not intended to replace advice given to you by your health care provider. Make sure you discuss any questions you have with your health care  provider. Document Revised: 06/14/2017 Document Reviewed: 06/14/2017 Elsevier Patient Education  2020 Elsevier Inc.  

## 2020-07-10 ENCOUNTER — Telehealth: Payer: Self-pay

## 2020-07-10 ENCOUNTER — Other Ambulatory Visit: Payer: Self-pay

## 2020-07-10 ENCOUNTER — Encounter (HOSPITAL_COMMUNITY): Payer: Self-pay | Admitting: Internal Medicine

## 2020-07-10 ENCOUNTER — Telehealth: Payer: Self-pay | Admitting: Internal Medicine

## 2020-07-10 DIAGNOSIS — Z79899 Other long term (current) drug therapy: Secondary | ICD-10-CM

## 2020-07-10 MED ORDER — LOSARTAN POTASSIUM 25 MG PO TABS: 12.5000 mg | ORAL_TABLET | Freq: Every day | ORAL | 3 refills | Status: DC

## 2020-07-10 NOTE — Telephone Encounter (Signed)
-----   Message from Erma Heritage, [place] sent at 07/10/2020  9:11 AM EST ----- I dicussed with the patient at her pre-cath appointment we would make adjustments in her medications based off her cath results. Given that she is not seeing Suanne Marker for 3 more weeks, please go ahead and have her start Losartan 12.5 mg daily. Repeat BMET in 2 weeks. At the time of her visit, this can either be further titrated or switched to [facility] pending BP response. Already on Bisoprolol which should help with her cardiomyopathy.

## 2020-07-10 NOTE — Telephone Encounter (Signed)
Patient contacted and verbalized understanding. Will place lab order and send medication to pharmacy.

## 2020-07-10 NOTE — Telephone Encounter (Signed)
New message     The new medication that you put her on she would like to ask you a few questions. She was sleeping when you called earlier

## 2020-07-10 NOTE — Telephone Encounter (Signed)
Reached out to patient. She just wanted to know if the losartan would replace any medication or if it was a new medication. I informed her that her losartan did not replace any of her medications, it is an added medication. Patient verbalized understanding and will call if she has any questions or concerns.

## 2020-07-16 ENCOUNTER — Telehealth: Payer: Self-pay | Admitting: Internal Medicine

## 2020-07-16 NOTE — Telephone Encounter (Signed)
Patient is c/o SOB, nausea, and headaches. She states it started when she started taking Losartan 25 mg (12.5 mg) tablets. She takes them at 4 pm. She denies tightness and CP. Please advise.

## 2020-07-16 NOTE — Telephone Encounter (Signed)
     I would recommend she try taking the 12.5mg  daily in the morning with food. If symptoms still do not improve after 2-3 days of doing this, we can discontinue it and try a different medication to help with the heart pumping function.   Signed, Erma Heritage, PA-C 07/16/2020, 3:27 PM Pager: [phone]

## 2020-07-16 NOTE — Telephone Encounter (Signed)
Pt c/o medication issue:  1. Name of Medication: losartan (COZAAR) 25 MG tablet  2. How are you currently taking this medication (dosage and times per day)?  1/2 tablet   3. Are you having a reaction (difficulty breathing--STAT)? yes  4. What is your medication issue? Headache and its making her sick , she takes it around 4pm in the evening. She started this on Friday and every morning she wakes up with headache and nausea and sickness to her stomach.  She did not take it yesterday and today she doesn't have the headache or the sick feeling to her stomach

## 2020-07-16 NOTE — Telephone Encounter (Signed)
Contacted patient and she said she would give this a try and let us know after 2-3 days how well it is working out.

## 2020-07-22 ENCOUNTER — Telehealth: Payer: Self-pay | Admitting: Student

## 2020-07-22 NOTE — Telephone Encounter (Signed)
Pt c/o medication issue:  1. Name of Medication: losartan (COZAAR) 25 MG tablet  2. How are you currently taking this medication (dosage and times per day)? Stopped   3. Are you having a reaction (difficulty breathing--STAT)? NO  4. What is your medication issue? Patient called to let [place] know she stopped taking this medication on Monday because she was sick on Sat. & Sun. She believes it caused her to have nausea and headaches. She has confirmed she will be at her 3/7 appointment.

## 2020-07-22 NOTE — Telephone Encounter (Signed)
Contacted patient. No answer, no voicemail.

## 2020-07-23 NOTE — Telephone Encounter (Signed)
Patient has an appointment with R. Barrett on 07/27/20. Patient states that she cannot tolerate losartan as it gives her headaches and causes her to fell nauseous.

## 2020-07-27 ENCOUNTER — Encounter: Payer: Self-pay | Admitting: Physician Assistant

## 2020-07-27 ENCOUNTER — Other Ambulatory Visit: Payer: Self-pay

## 2020-07-27 ENCOUNTER — Ambulatory Visit: Payer: Medicare HMO | Admitting: Physician Assistant

## 2020-07-27 VITALS — BP 134/70 | HR 53 | Ht 61.0 in | Wt 176.2 lb

## 2020-07-27 DIAGNOSIS — I5042 Chronic combined systolic (congestive) and diastolic (congestive) heart failure: Secondary | ICD-10-CM

## 2020-07-27 DIAGNOSIS — E785 Hyperlipidemia, unspecified: Secondary | ICD-10-CM | POA: Diagnosis not present

## 2020-07-27 DIAGNOSIS — Z79899 Other long term (current) drug therapy: Secondary | ICD-10-CM

## 2020-07-27 DIAGNOSIS — I2510 Atherosclerotic heart disease of native coronary artery without angina pectoris: Secondary | ICD-10-CM | POA: Diagnosis not present

## 2020-07-27 MED ORDER — SPIRONOLACTONE 25 MG PO TABS: 12.5000 mg | ORAL_TABLET | Freq: Every day | ORAL | 1 refills | Status: DC

## 2020-07-27 MED ORDER — LISINOPRIL 5 MG PO TABS: 5.0000 mg | ORAL_TABLET | Freq: Every day | ORAL | 3 refills | Status: DC

## 2020-07-27 MED ORDER — ATORVASTATIN CALCIUM 20 MG PO TABS: 20.0000 mg | ORAL_TABLET | Freq: Every day | ORAL | 3 refills | Status: DC

## 2020-07-27 NOTE — Progress Notes (Signed)
Cardiology Office Note   Date:  07/27/2020   ID:  [name], DOB 1935-06-28, MRN [id number]  PCP:  Lemmie Evens, MD Cardiologist:  Dorris Carnes, MD 12/05/2019 [facility]. [facility], [facility] 07/03/2020 Electrphysiologist: None Rosaria Ferries, PA-C   No chief complaint on file.   History of Present Illness: [name] is a 85 y.o. female with a history of PAF, S-D-CHF, EF 35-40% 05/2020 echo, mild AS, LBBB, CLL, COPD, remote breast CA, remote migraines.  Seen 02/11 and scheduled for cath 02/17. Non-obstructive dz, maximize GDMT.  Losartan started, pt did not tolerate.  [name] presents for cardiology follow up.  She did not tolerate the losartan, tried it for several days, with and without food.  She is watching the salt more carefully, has lost 2 lbs. Needs a refill on spironolactone. Breathing is ok. Major activity limitation is knee pain. She will get a rollator soon.  Denies orthopnea or PND, does not wake with LE edema.  She drinks 1/2 liter bottles of water, usually 1-2/day.  She gets a flutter in her heart when she lays down, on the L side of her chest. It resolves in a few minutes. She also gets an occasional pain at rest on the L side of her chest. It resolves with Tylenol.   COVID status: vaccinated, did not have COVID Past Medical History:  Diagnosis Date  . Bowel obstruction ([place])   . Breast cancer ([place])   . Breast cancer, stage 3 ([place]) 07/21/2011  . Bronchitis   . CHF (congestive heart failure) ([place])   . CLL (chronic lymphocytic leukemia) ([place]) 07/21/2011  . Migraines   . PAF (paroxysmal atrial fibrillation) ([place])   . Pneumonia     Past Surgical History:  Procedure Laterality Date  . ABDOMINAL HYSTERECTOMY    . APPENDECTOMY    . CHOLECYSTECTOMY    . LAPAROTOMY N/A 06/08/2015   Procedure: EXPLORATORY LAPAROTOMY;  Surgeon: Aviva Signs, MD;  Location: [facility];  Service: General;  Laterality: N/A;  . LYSIS OF ADHESION N/A 06/08/2015    Procedure: LYSIS OF ADHESION;  Surgeon: Aviva Signs, MD;  Location: [facility];  Service: General;  Laterality: N/A;  . MASTECTOMY     right  . RIGHT/LEFT HEART CATH AND CORONARY ANGIOGRAPHY N/A 07/09/2020   Procedure: RIGHT/LEFT HEART CATH AND CORONARY ANGIOGRAPHY;  Surgeon: Nelva Bush, MD;  Location: [place];  Service: Cardiovascular;  Laterality: N/A;  . TOTAL HIP ARTHROPLASTY     right    Current Outpatient Medications  Medication Sig Dispense Refill  . acetaminophen (TYLENOL) 500 MG tablet Take 1,000 mg by mouth every 6 (six) hours as needed for moderate pain or headache.    . albuterol (PROVENTIL HFA;VENTOLIN HFA) 108 (90 BASE) MCG/ACT inhaler Inhale 2 puffs into the lungs every 6 (six) hours as needed for shortness of breath.     Marland Kitchen albuterol (PROVENTIL) (2.5 MG/3ML) 0.083% nebulizer solution Take 3 mLs (2.5 mg total) by nebulization every 6 (six) hours as needed for wheezing or shortness of breath (when not relieved by inhaler.). 75 mL 12  . apixaban (ELIQUIS) 5 MG TABS tablet Take 1 tablet (5 mg total) by mouth 2 (two) times daily. 180 tablet 1  . bisoprolol (ZEBETA) 10 MG tablet Take 1 tablet (10 mg total) by mouth daily. 90 tablet 3  . Blood Glucose Monitoring Suppl ([place] FLEX SYSTEM) w/Device KIT     . diphenhydrAMINE (BENADRYL) 25 MG tablet Take 25 mg by  mouth daily as needed for allergies.    Marland Kitchen ergocalciferol (VITAMIN D2) 1.25 MG (50000 UT) capsule Take 1 capsule (50,000 Units total) by mouth once a week. (Patient taking differently: Take 50,000 Units by mouth every Wednesday.) 12 capsule 1  . Menthol-Methyl Salicylate (SALONPAS PAIN RELIEF PATCH EX) Apply 1 patch topically daily as needed (pain).    Marland Kitchen omeprazole (PRILOSEC) 20 MG capsule Take 20 mg by mouth daily.    Marland Kitchen spironolactone (ALDACTONE) 25 MG tablet Take 0.5 tablets (12.5 mg total) by mouth daily. 45 tablet 1   No current facility-administered medications for this visit.    Allergies:   Codeine,  Losartan, Meclizine, Sulfa antibiotics, and Zoloft [sertraline hcl]    Social History:  The patient  reports that she quit smoking about 41 years ago. She has never used smokeless tobacco. She reports that she does not drink alcohol and does not use drugs.   Family History:  The patient's family history includes Cancer in her brother and another family member.  She indicated that her mother is deceased. She indicated that her father is deceased. She indicated that only one of her two brothers is alive. She indicated that one of her two others is deceased.   ROS:  Please see the history of present illness. All other systems are reviewed and negative.    PHYSICAL EXAM: VS:  BP 134/70   Pulse (!) 53   Ht 5' 1"  (1.549 m)   Wt 176 lb 3.2 oz (79.9 kg)   SpO2 97%   BMI 33.29 kg/m  , BMI Body mass index is 33.29 kg/m. GEN: Well nourished, well developed, female in no acute distress HEENT: normal for age  Neck: no JVD, no carotid bruit, no masses Cardiac: RRR; 2/6 murmur, no rubs, or gallops Respiratory:  clear to auscultation bilaterally, normal work of breathing GI: soft, nontender, nondistended, + BS MS: no deformity or atrophy; no edema; distal pulses are 2+ in all 4 extremities  Skin: warm and dry, no rash Neuro:  Strength and sensation are intact Psych: euthymic mood, full affect   EKG:  EKG is not ordered today.  ECHO: 06/01/2020 1. Left ventricular ejection fraction, by estimation, is 35 to 40%. The  left ventricle has moderately decreased function. The left ventricle  demonstrates regional wall motion abnormalities (see scoring  diagram/findings for description). There is mild  left ventricular hypertrophy. Left ventricular diastolic parameters are  consistent with Grade I diastolic dysfunction (impaired relaxation).  Elevated left atrial pressure.  2. Right ventricular systolic function is normal. The right ventricular  size is normal. There is normal pulmonary artery  systolic pressure.  3. Left atrial size was severely dilated.  4. The pericardial effusion is circumferential.  5. The mitral valve is normal in structure. No evidence of mitral valve  regurgitation. No evidence of mitral stenosis.  6. The aortic valve is tricuspid. There is moderate calcification of the  aortic valve. There is moderate thickening of the aortic valve. Aortic  valve regurgitation is not visualized. Mild to moderate aortic valve  stenosis.  7. The inferior vena cava is normal in size with greater than 50%  respiratory variability, suggesting right atrial pressure of 3 mmHg.   CATH: 07/09/2020 Conclusions: 1. Mild to moderate, non-obstructive coronary artery disease with 30-40% mid LAD stenosis and mild luminal irregularities involving the LCx and RCA. 2. Severely reduced left ventricular systolic function (LVEF 19-75%). 3. Upper normal left heart, right heart, and pulmonary artery pressures. 4. Normal  Fick cardiac output/index. 5. Mild aortic stenosis.  Recommendations: 1. Medical therapy and risk factor modification to prevent progression of coronary artery disease. 2. Escalate goal directed medical therapy for treatment of nonischemic cardiomyopathy, as tolerated. 3. Restart apixaban tomorrow morning if no evidence of bleeding/vascular complication.   MONITOR: 02/2020 Patient had a min HR of 39 bpm, max HR of 190 bpm, and avg HR of 60 bpm. Predominant underlying rhythm was Sinus Rhythm. Bundle Branch Block/IVCD was present. 3 Ventricular Tachycardia runs occurred, the run with the fastest interval lasting 7 beats with a max rate of 190 bpm, the longest lasting 4 beats with an avg rate of 128 bpm. 43 Supraventricular Tachycardia runs occurred, the run with the fastest interval lasting 14 beats with a max rate of 164 bpm, the longest lasting 19 beats with an avg rate of 127 bpm. Atrial Fibrillation occurred (3% burden), ranging from 59-137 bpm (avg of 83 bpm),  the longest lasting 9 hours 23 mins with an avg rate of 83 bpm. Atrial Fibrillation was present at de-activation of device. Ventricular Tachycardia, Supraventricular Tachycardia and Atrial Fibrillation were detected within +/- 45 seconds of symptomatic patient event(s). Isolated SVEs were frequent (6.3%, [id number]), SVE Couplets were rare (<1.0%, 481), and SVE Triplets were rare (<1.0%, 64). Isolated VEs were occasional (1.5%, 16490), VE Couplets were rare (<1.0%, 628), and no VE Triplets were present. Ventricular Bigeminy was present. Some VEs may be SVEs with possible aberrancy.   Recent Labs: 01/15/2020: TSH 2.070 04/02/2020: ALT 29 07/03/2020: BUN 22; Creat 1.02; Platelets 224 07/09/2020: Hemoglobin 11.9; Potassium 3.5; Sodium 144  CBC Lab Results  Component Value Date   WBC 45.5 (H) 07/03/2020   HGB 11.9 (L) 07/09/2020   HCT 35.0 (L) 07/09/2020   MCV 90.5 07/03/2020   PLT 224 07/03/2020    CMP Latest Ref Rng & Units 07/09/2020 07/09/2020 07/03/2020  Glucose 65 - 99 mg/dL - - 82  BUN 7 - 25 mg/dL - - 22  Creatinine 0.60 - 0.88 mg/dL - - 1.02(H)  Sodium 135 - 145 mmol/L 144 144 142  Potassium 3.5 - 5.1 mmol/L 3.5 3.5 4.4  Chloride 98 - 110 mmol/L - - 105  CO2 20 - 32 mmol/L - - 25  Calcium 8.6 - 10.4 mg/dL - - 9.6  Total Protein 6.5 - 8.1 g/dL - - -  Total Bilirubin 0.3 - 1.2 mg/dL - - -  Alkaline Phos 38 - 126 U/L - - -  AST 15 - 41 U/L - - -  ALT 0 - 44 U/L - - -     Lipid Panel Lab Results  Component Value Date   CHOL 197 01/15/2020   HDL 43 01/15/2020   LDLCALC 127 (H) 01/15/2020   TRIG 134 01/15/2020   CHOLHDL 4.6 01/15/2020      Wt Readings from Last 3 Encounters:  07/27/20 176 lb 3.2 oz (79.9 kg)  07/09/20 178 lb (80.7 kg)  07/03/20 177 lb 3.2 oz (80.4 kg)     Other studies Reviewed: Additional studies/ records that were reviewed today include: Office notes, hospital records and testing.  ASSESSMENT AND PLAN:  1.  CAD: - moderate by recent cath -  needs lipid lowering rx, add statin - continue BB, no ASA 2nd Eliquis  2. Chronic combined CHF - Wt is stable, volume status is good. - she is watching the sodium carefully - did not tolerate losartan >> listed as allergy - add lisinopril, ck BMET 1 week - refill spiro  3. Dyslipidemia, goal LDL < 70 - T chol just under 200 - LDL is 127 - add Lipitor 20 mg qd - ck lipids and liver in 2-3 months  Current medicines are reviewed at length with the patient today.  The patient has concerns regarding medicines. Concerns were addressed  The following changes have been made:  Add lisinopril and Lipitor  Labs/ tests ordered today include:  No orders of the defined types were placed in this encounter.  Disposition:   FU with Dorris Carnes, MD  Signed, Rosaria Ferries, PA-C  07/27/2020 1:01 PM    [facility] Phone: [phone]; Fax: [phone]

## 2020-07-27 NOTE — Patient Instructions (Addendum)
Medication Instructions:  START  Lisinopril 5 mg tablets daily  IN ONE WEEK  Start Lipitor 20 mg tablets daily  *If you need a refill on your cardiac medications before your next appointment, please call your pharmacy*   Lab Work: BMET -1 week If you have labs (blood work) drawn today and your tests are completely normal, you will receive your results only by: [place] Kitchen MyChart Message (if you have MyChart) OR . A paper copy in the mail If you have any lab test that is abnormal or we need to change your treatment, we will call you to review the results.   Testing/Procedures: None   Follow-Up: At [facility], you and your health needs are our priority.  As part of our continuing mission to provide you with exceptional heart care, we have created designated Provider Care Teams.  These Care Teams include your primary Cardiologist (physician) and Advanced Practice Providers (APPs -  Physician Assistants and Nurse Practitioners) who all work together to provide you with the care you need, when you need it.  We recommend signing up for the patient portal called "MyChart".  Sign up information is provided on this After Visit Summary.  MyChart is used to connect with patients for Virtual Visits (Telemedicine).  Patients are able to view lab/test results, encounter notes, upcoming appointments, etc.  Non-urgent messages can be sent to your provider as well.   To learn more about what you can do with MyChart, go to [email or web address].    Your next appointment:   Keep your follow up appointment with Dorris Carnes, MD.  Other Instructions  WEIGH DAILY, every am, wearing the same amount of clothing Record weights, and bring it to office appointments Contact Dorris Carnes, MD for weight gain of 3 lbs in a day or 5 lbs in a week Limit sodium to 500 mg per meal, total 2000 mg per day Limit all liquids to 1.5-2 liters/quarts per day

## 2020-07-29 DIAGNOSIS — I2510 Atherosclerotic heart disease of native coronary artery without angina pectoris: Secondary | ICD-10-CM | POA: Diagnosis not present

## 2020-07-29 DIAGNOSIS — C9110 Chronic lymphocytic leukemia of B-cell type not having achieved remission: Secondary | ICD-10-CM | POA: Diagnosis not present

## 2020-07-29 DIAGNOSIS — I350 Nonrheumatic aortic (valve) stenosis: Secondary | ICD-10-CM | POA: Diagnosis not present

## 2020-07-29 DIAGNOSIS — I480 Paroxysmal atrial fibrillation: Secondary | ICD-10-CM | POA: Diagnosis not present

## 2020-08-03 ENCOUNTER — Other Ambulatory Visit (HOSPITAL_COMMUNITY)
Admission: RE | Admit: 2020-08-03 | Discharge: 2020-08-03 | Disposition: A | Discharge status: Home / Self Care | Payer: Medicare HMO | Source: Ambulatory Visit | Attending: Physician Assistant | Admitting: Physician Assistant

## 2020-08-03 DIAGNOSIS — Z79899 Other long term (current) drug therapy: Secondary | ICD-10-CM | POA: Diagnosis not present

## 2020-08-03 LAB — BASIC METABOLIC PANEL
Anion gap: 12 (ref 5–15)
BUN: 17 mg/dL (ref 8–23)
CO2: 20 mmol/L — ABNORMAL LOW (ref 22–32)
Calcium: 8.9 mg/dL (ref 8.9–10.3)
Chloride: 107 mmol/L (ref 98–111)
Creatinine, Ser: 0.87 mg/dL (ref 0.44–1.00)
GFR, Estimated: >60 mL/min (ref >60)
Glucose, Bld: 211 mg/dL — ABNORMAL HIGH (ref 70–99)
Potassium: 3.9 mmol/L (ref 3.5–5.1)
Sodium: 139 mmol/L (ref 135–145)

## 2020-08-21 DIAGNOSIS — C9110 Chronic lymphocytic leukemia of B-cell type not having achieved remission: Secondary | ICD-10-CM | POA: Diagnosis not present

## 2020-08-21 DIAGNOSIS — R799 Abnormal finding of blood chemistry, unspecified: Secondary | ICD-10-CM | POA: Diagnosis not present

## 2020-08-21 DIAGNOSIS — J449 Chronic obstructive pulmonary disease, unspecified: Secondary | ICD-10-CM | POA: Diagnosis not present

## 2020-08-21 DIAGNOSIS — R739 Hyperglycemia, unspecified: Secondary | ICD-10-CM | POA: Diagnosis not present

## 2020-08-21 DIAGNOSIS — C9111 Chronic lymphocytic leukemia of B-cell type in remission: Secondary | ICD-10-CM | POA: Diagnosis not present

## 2020-08-21 DIAGNOSIS — I480 Paroxysmal atrial fibrillation: Secondary | ICD-10-CM | POA: Diagnosis not present

## 2020-08-21 DIAGNOSIS — N390 Urinary tract infection, site not specified: Secondary | ICD-10-CM | POA: Diagnosis not present

## 2020-08-28 ENCOUNTER — Ambulatory Visit: Payer: Medicare HMO | Admitting: Internal Medicine

## 2020-08-31 NOTE — Progress Notes (Addendum)
Cardiology Office Note    Date:  09/01/2020   ID:  [name], DOB 05-02-1936, MRN [id number]  PCP:  Lemmie Evens, MD  Cardiologist: Dorris Carnes, MD    Chief Complaint  Patient presents with  . Follow-up    1 month visit    History of Present Illness:    [name] is a 85 y.o. female with past medical history ofparoxysmal atrial fibrillation,chronic combined systolic and diastolic CHF/NICM (EF previously 55-60% in 06/2018, reduced to 40-45% by repeat echo in 03/2019 and at 35-40% by echo in 05/2020), CAD (cath in 06/2020 showing mild to moderate nonobstructive CAD), aortic stenosis,LBBB,CLL and COPD who presents to the office today for 70-monthfollow-up.   She was last examined by RRosaria Ferries PA-C on 07/27/2020 for follow-up from her recent cardiac catheterization which had shown nonobstructive CAD. She had been started on Losartan following her catheterization but did not tolerate it secondary to headaches and nausea. She was started on Lisinopril 516mdaily and Lipitor 2074maily with follow-up BMET recommended in 1 week. This showed stable K+ at 3.9 and creatinine at 0.87.  In talking with the patient today, she reports initially having dizziness after starting Lisinopril but she started taking this an hour after her Bisoprolol and Spironolactone and symptoms resolved. BP is soft at 92/62 during today's visit but she denies any dizziness or presyncope. She has been following her vitals at home and SBP has been in the low-100's to 110's. No recent orthopnea, PND or edema. No recent chest pain and she reports her breathing has improved. She has made several dietary changes and is trying to closely monitor her sodium intake. Now reading labels at the grocery store.    Past Medical History:  Diagnosis Date  . Bowel obstruction ([facility] . [facility] [place] . Breast [place], stage 3 ([place]/28/2013  . Bronchitis   . CAD (coronary artery disease)    a. cath in  06/2020 showing mild to moderate nonobstructive CAD  . CHF (congestive heart failure) ([place]  a. EF previously 55-60% in 06/2018 b. EF reduced to 40-45% by repeat echo in 03/2019 and at 35-40% by echo in 05/2020  . CLL (chronic lymphocytic leukemia) ([facility]/28/2013  . Migraines   . PAF (paroxysmal atrial fibrillation) ([place] . Pneumonia     Past Surgical History:  Procedure Laterality Date  . ABDOMINAL HYSTERECTOMY    . APPENDECTOMY    . CHOLECYSTECTOMY    . LAPAROTOMY N/A 06/08/2015   Procedure: EXPLORATORY LAPAROTOMY;  Surgeon: MarAviva SignsD;  Location: [facility];  Service: General;  Laterality: N/A;  . LYSIS OF ADHESION N/A 06/08/2015   Procedure: LYSIS OF ADHESION;  Surgeon: MarAviva SignsD;  Location: [facility];  Service: General;  Laterality: N/A;  . MASTECTOMY     right  . RIGHT/LEFT HEART CATH AND CORONARY ANGIOGRAPHY N/A 07/09/2020   Procedure: RIGHT/LEFT HEART CATH AND CORONARY ANGIOGRAPHY;  Surgeon: EndNelva BushD;  Location: [facility];  Service: Cardiovascular;  Laterality: N/A;  . TOTAL HIP ARTHROPLASTY     right    Current Medications: Outpatient Medications Prior to Visit  Medication Sig Dispense Refill  . acetaminophen (TYLENOL) 500 MG tablet Take 1,000 mg by mouth every 6 (six) hours as needed for moderate pain or headache.    . albuterol (PROVENTIL HFA;VENTOLIN HFA) 108 (90 BASE) MCG/ACT inhaler Inhale 2 puffs into the lungs every 6 (six) hours as  needed for shortness of breath.     Marland Kitchen albuterol (PROVENTIL) (2.5 MG/3ML) 0.083% nebulizer solution Take 3 mLs (2.5 mg total) by nebulization every 6 (six) hours as needed for wheezing or shortness of breath (when not relieved by inhaler.). 75 mL 12  . apixaban (ELIQUIS) 5 MG TABS tablet Take 1 tablet (5 mg total) by mouth 2 (two) times daily. 180 tablet 1  . atorvastatin (LIPITOR) 20 MG tablet Take 1 tablet (20 mg total) by mouth daily. 90 tablet 3  . bisoprolol (ZEBETA) 10 MG tablet Take 1 tablet (10 mg  total) by mouth daily. 90 tablet 3  . Capsaicin-Menthol (SALONPAS GEL EX) Apply topically.    . diphenhydrAMINE (BENADRYL) 25 MG tablet Take 25 mg by mouth daily as needed for allergies.    Marland Kitchen ergocalciferol (VITAMIN D2) 1.25 MG (50000 UT) capsule Take 1 capsule (50,000 Units total) by mouth once a week. (Patient taking differently: Take 50,000 Units by mouth every Wednesday.) 12 capsule 1  . lisinopril (ZESTRIL) 5 MG tablet Take 1 tablet (5 mg total) by mouth daily. 90 tablet 3  . omeprazole (PRILOSEC) 20 MG capsule Take 20 mg by mouth daily.    Marland Kitchen spironolactone (ALDACTONE) 25 MG tablet Take 0.5 tablets (12.5 mg total) by mouth daily. 45 tablet 1  . Blood Glucose Monitoring Suppl ([place]) w/Device KIT  (Patient not taking: Reported on 09/01/2020)    . Menthol-Methyl Salicylate (SALONPAS PAIN RELIEF PATCH EX) Apply 1 patch topically daily as needed (pain).     No facility-administered medications prior to visit.     Allergies:   Codeine, Losartan, Meclizine, Sulfa antibiotics, and Zoloft [sertraline hcl]   Social History   Socioeconomic History  . Marital status: Widowed    Spouse name: Not on file  . Number of children: Not on file  . Years of education: 55  . Highest education level: Not on file  Occupational History  . Not on file  Tobacco Use  . Smoking status: Former Smoker    Quit date: 10/26/1978    Years since quitting: 41.8  . Smokeless tobacco: Never Used  Vaping Use  . Vaping Use: Never used  Substance and Sexual Activity  . Alcohol use: No  . Drug use: No  . Sexual activity: Not Currently  Other Topics Concern  . Not on file  Social History Narrative   Pt lives by herself   Social Determinants of Health   Financial Resource Strain: Low Risk   . Difficulty of Paying Living Expenses: Not hard at all  Food Insecurity: No Food Insecurity  . Worried About Charity fundraiser in the Last Year: Never true  . Ran Out of Food in the Last Year: Never  true  Transportation Needs: No Transportation Needs  . Lack of Transportation (Medical): No  . Lack of Transportation (Non-Medical): No  Physical Activity: Inactive  . Days of Exercise per Week: 0 days  . Minutes of Exercise per Session: 0 min  Stress: Not on file  Social Connections: Socially Isolated  . Frequency of Communication with Friends and Family: More than three times a week  . Frequency of Social Gatherings with Friends and Family: Twice a week  . Attends Religious Services: Never  . Active Member of Clubs or Organizations: No  . Attends Archivist Meetings: Never  . Marital Status: Widowed     Family History:  The patient's family history includes Cancer in her brother and another family  member.   Review of Systems:   Please see the history of present illness.     General:  No chills, fever, night sweats or weight changes.  Cardiovascular:  No chest pain, edema, orthopnea, palpitations, paroxysmal nocturnal dyspnea. Positive for dyspnea (improved).  Dermatological: No rash, lesions/masses Respiratory: No cough.  Urologic: No hematuria, dysuria Abdominal:   No nausea, vomiting, diarrhea, bright red blood per rectum, melena, or hematemesis Neurologic:  No visual changes, wkns, changes in mental status. All other systems reviewed and are otherwise negative except as noted above.   Physical Exam:    VS:  BP 92/62   Pulse (!) 52   Ht 5' 1"  (1.549 m)   Wt 171 lb (77.6 kg)   SpO2 95%   BMI 32.31 kg/m    General: Well developed, well nourished,female appearing in no acute distress. Head: Normocephalic, atraumatic. Neck: No carotid bruits. JVD not elevated.  Lungs: Respirations regular and unlabored, without wheezes or rales.  Heart: Regular rate and rhythm. No S3 or S4. 2/6 SEM along RUSB.  Abdomen: Appears non-distended. No obvious abdominal masses. Msk:  Strength and tone appear normal for age. No obvious joint deformities or effusions. Extremities: No  clubbing or cyanosis. No lower extremity edema.  Distal pedal pulses are 2+ bilaterally. Neuro: Alert and oriented X 3. Moves all extremities spontaneously. No focal deficits noted. Psych:  Responds to questions appropriately with a normal affect. Skin: No rashes or lesions noted  Wt Readings from Last 3 Encounters:  09/01/20 171 lb (77.6 kg)  07/27/20 176 lb 3.2 oz (79.9 kg)  07/09/20 178 lb (80.7 kg)     Studies/Labs Reviewed:   EKG:  EKG is not ordered today.    Recent Labs: 01/15/2020: TSH 2.070 04/02/2020: ALT 29 07/03/2020: Platelets 224 07/09/2020: Hemoglobin 11.9 08/03/2020: BUN 17; Creatinine, Ser 0.87; Potassium 3.9; Sodium 139   Lipid Panel    Component Value Date/Time   CHOL 197 01/15/2020 1312   TRIG 134 01/15/2020 1312   HDL 43 01/15/2020 1312   CHOLHDL 4.6 01/15/2020 1312   VLDL 27 01/15/2020 1312   LDLCALC 127 (H) 01/15/2020 1312    Additional studies/ records that were reviewed today include:   Echocardiogram: 05/2020 IMPRESSIONS    1. Left ventricular ejection fraction, by estimation, is 35 to 40%. The  left ventricle has moderately decreased function. The left ventricle  demonstrates regional wall motion abnormalities (see scoring  diagram/findings for description). There is mild  left ventricular hypertrophy. Left ventricular diastolic parameters are  consistent with Grade I diastolic dysfunction (impaired relaxation).  Elevated left atrial pressure.  2. Right ventricular systolic function is normal. The right ventricular  size is normal. There is normal pulmonary artery systolic pressure.  3. Left atrial size was severely dilated.  4. The pericardial effusion is circumferential.  5. The mitral valve is normal in structure. No evidence of mitral valve  regurgitation. No evidence of mitral stenosis.  6. The aortic valve is tricuspid. There is moderate calcification of the  aortic valve. There is moderate thickening of the aortic valve. Aortic   valve regurgitation is not visualized. Mild to moderate aortic valve  stenosis.  7. The inferior vena cava is normal in size with greater than 50%  respiratory variability, suggesting right atrial pressure of 3 mmHg.   Cardiac Catheterization: 06/2020 Conclusions: 1. Mild to moderate, non-obstructive coronary artery disease with 30-40% mid LAD stenosis and mild luminal irregularities involving the LCx and RCA. 2. Severely reduced left ventricular  systolic function (LVEF 22-48%). 3. Upper normal left heart, right heart, and pulmonary artery pressures. 4. Normal Fick cardiac output/index. 5. Mild aortic stenosis.  Recommendations: 1. Medical therapy and risk factor modification to prevent progression of coronary artery disease. 2. Escalate goal directed medical therapy for treatment of nonischemic cardiomyopathy, as tolerated. 3. Restart apixaban tomorrow morning if no evidence of bleeding/vascular complication.    Assessment:    1. Chronic combined systolic and diastolic heart failure ([place])   2. Non-obstructive Coronary artery disease involving native coronary artery of native heart without angina pectoris   3. PAF (paroxysmal atrial fibrillation) ([place])   4. Aortic valve stenosis, etiology of cardiac valve disease unspecified   5. Dyslipidemia, goal LDL below 70      Plan:   In order of problems listed above:  1. Chronic Combined Systolic and Diastolic CHF - Her EF was at 35-40% by echo in 05/2020 with catheterization in 06/2020 showing mild to moderate nonobstructive CAD. - Her volume status remains stable and she has made significant dietary changes and is limiting the sodium in her diet. - Given her BP at 92/62 today, I am unable to further titrate her current medications. Will continue Bisoprolol 40m daily (previously intolerant to Toprol-XL), Lisinopril 563mdaily and Spironolactone 12.53m24maily. BP does not allow for switching to [facility] reviewed she could take her  Lisinopril in the evening hours if she has recurrent dizziness. Would anticipate obtaining a repeat echocardiogram in several months for reassessment. Will arrange follow-up prior to this to see if anything else can be titrated or can consider adding an SGLT2-inhibitor at that time.   2. CAD - Recent cardiac catheterization showed nonobstructive CAD as outlined above. She denies any recent anginal symptoms.  - Continue Bisoprolol 51m58mily and Atorvastatin 20mg49mly. No ASA given the need for anticoagulation.   3. Paroxysmal Atrial Fibrillation - She does experience occasional palpitations but denies any persistent symptoms. Continue Bisoprolol 51mg 88my for rate-control. - She denies any evidence of active bleeding. Remains on Eliquis 53mg BI86mor anticoagulation.   4. Aortic Stenosis - This was mild to moderate by her most recent echocardiogram. Will continue to follow.   5. HLD - Her LDL was at 127 in 12/2019 and she was recently started on Atorvastatin 20mg da22mand is tolerating this well. Would plan for repeat FLP and LFT's at her next visit.    Medication Adjustments/Labs and Tests Ordered: Current medicines are reviewed at length with the patient today.  Concerns regarding medicines are outlined above.  Medication changes, Labs and Tests ordered today are listed in the Patient Instructions below. Patient Instructions  Medication Instructions:  Your physician recommends that you continue on your current medications as directed. Please refer to the Current Medication list given to you today.    You may take your Lisinopril 1 hour after you take your morning medications, or, you may take it at night.   *If you need a refill on your cardiac medications before your next appointment, please call your pharmacy*   Lab Work: None today If you have labs (blood work) drawn today and your tests are completely normal, you will receive your results only by: . MyCharMarland Kitchen Message (if you  have MyChart) OR . A paper copy in the mail If you have any lab test that is abnormal or we need to change your treatment, we will call you to review the results.   Testing/Procedures: None today   Follow-Up: At [facility]  you and your health needs are our priority.  As part of our continuing mission to provide you with exceptional heart care, we have created designated Provider Care Teams.  These Care Teams include your primary Cardiologist (physician) and Advanced Practice Providers (APPs -  Physician Assistants and Nurse Practitioners) who all work together to provide you with the care you need, when you need it.  We recommend signing up for the patient portal called "MyChart".  Sign up information is provided on this After Visit Summary.  MyChart is used to connect with patients for Virtual Visits (Telemedicine).  Patients are able to view lab/test results, encounter notes, upcoming appointments, etc.  Non-urgent messages can be sent to your provider as well.   To learn more about what you can do with MyChart, go to [email or web address].    Your next appointment:   2 month(s)  The format for your next appointment:   In Person  Provider:   You may see Dorris Carnes, MD or one of the following Advanced Practice Providers on your designated Care Team:    Bernerd Pho, PA-C       Other Instructions None          Signed, Erma Heritage, Hershal Coria  09/01/2020 6:40 PM    [facility] [address] [place], [place] [address] Phone: [phone] Fax: [phone]

## 2020-09-01 ENCOUNTER — Encounter: Payer: Self-pay | Admitting: Student

## 2020-09-01 ENCOUNTER — Ambulatory Visit: Payer: Medicare HMO | Admitting: Student

## 2020-09-01 ENCOUNTER — Other Ambulatory Visit: Payer: Self-pay

## 2020-09-01 VITALS — BP 92/62 | HR 52 | Ht 61.0 in | Wt 171.0 lb

## 2020-09-01 DIAGNOSIS — I5042 Chronic combined systolic (congestive) and diastolic (congestive) heart failure: Secondary | ICD-10-CM | POA: Diagnosis not present

## 2020-09-01 DIAGNOSIS — I2510 Atherosclerotic heart disease of native coronary artery without angina pectoris: Secondary | ICD-10-CM

## 2020-09-01 DIAGNOSIS — E785 Hyperlipidemia, unspecified: Secondary | ICD-10-CM

## 2020-09-01 DIAGNOSIS — I480 Paroxysmal atrial fibrillation: Secondary | ICD-10-CM

## 2020-09-01 DIAGNOSIS — I350 Nonrheumatic aortic (valve) stenosis: Secondary | ICD-10-CM | POA: Diagnosis not present

## 2020-09-01 NOTE — Progress Notes (Signed)
Agree with plans   Need to be careful with escalation given age and BP Might tolerate SGLT2 I.   Confirm stable on these meds first

## 2020-09-01 NOTE — Patient Instructions (Signed)
Medication Instructions:  Your physician recommends that you continue on your current medications as directed. Please refer to the Current Medication list given to you today.    You may take your Lisinopril 1 hour after you take your morning medications, or, you may take it at night.   *If you need a refill on your cardiac medications before your next appointment, please call your pharmacy*   Lab Work: None today If you have labs (blood work) drawn today and your tests are completely normal, you will receive your results only by: Marland Kitchen MyChart Message (if you have MyChart) OR . A paper copy in the mail If you have any lab test that is abnormal or we need to change your treatment, we will call you to review the results.   Testing/Procedures: None today   Follow-Up: At [facility], you and your health needs are our priority.  As part of our continuing mission to provide you with exceptional heart care, we have created designated Provider Care Teams.  These Care Teams include your primary Cardiologist (physician) and Advanced Practice Providers (APPs -  Physician Assistants and Nurse Practitioners) who all work together to provide you with the care you need, when you need it.  We recommend signing up for the patient portal called "MyChart".  Sign up information is provided on this After Visit Summary.  MyChart is used to connect with patients for Virtual Visits (Telemedicine).  Patients are able to view lab/test results, encounter notes, upcoming appointments, etc.  Non-urgent messages can be sent to your provider as well.   To learn more about what you can do with MyChart, go to [email or web address].    Your next appointment:   2 month(s)  The format for your next appointment:   In Person  Provider:   You may see Dorris Carnes, MD or one of the following Advanced Practice Providers on your designated Care Team:    Bernerd Pho, PA-C       Other  Instructions None

## 2020-09-08 ENCOUNTER — Ambulatory Visit: Payer: Medicare HMO | Admitting: Family Medicine

## 2020-09-17 ENCOUNTER — Other Ambulatory Visit: Payer: Self-pay | Admitting: Internal Medicine

## 2020-09-17 NOTE — Telephone Encounter (Signed)
Pt last saw [place], [place] on 09/01/20, last labs 08/03/20 Creat 0.87, age 85, weight 77.6kg, based on specified criteria pt is on appropriate dosage of Eliquis 5mg  BID.  Will refill rx.

## 2020-10-05 ENCOUNTER — Other Ambulatory Visit (HOSPITAL_COMMUNITY): Payer: Self-pay

## 2020-10-05 DIAGNOSIS — E559 Vitamin D deficiency, unspecified: Secondary | ICD-10-CM

## 2020-10-05 DIAGNOSIS — C50911 Malignant neoplasm of unspecified site of right female breast: Secondary | ICD-10-CM

## 2020-10-05 DIAGNOSIS — C9110 Chronic lymphocytic leukemia of B-cell type not having achieved remission: Secondary | ICD-10-CM

## 2020-10-06 ENCOUNTER — Inpatient Hospital Stay (HOSPITAL_COMMUNITY): Payer: Medicare HMO

## 2020-10-07 ENCOUNTER — Inpatient Hospital Stay (HOSPITAL_COMMUNITY): Payer: Medicare HMO | Attending: Hematology

## 2020-10-07 ENCOUNTER — Other Ambulatory Visit: Payer: Self-pay

## 2020-10-07 DIAGNOSIS — Z87891 Personal history of nicotine dependence: Secondary | ICD-10-CM | POA: Diagnosis not present

## 2020-10-07 DIAGNOSIS — C50911 Malignant neoplasm of unspecified site of right female breast: Secondary | ICD-10-CM

## 2020-10-07 DIAGNOSIS — N951 Menopausal and female climacteric states: Secondary | ICD-10-CM | POA: Diagnosis not present

## 2020-10-07 DIAGNOSIS — Z90710 Acquired absence of both cervix and uterus: Secondary | ICD-10-CM | POA: Diagnosis not present

## 2020-10-07 DIAGNOSIS — Z853 Personal history of malignant neoplasm of breast: Secondary | ICD-10-CM | POA: Insufficient documentation

## 2020-10-07 DIAGNOSIS — C9110 Chronic lymphocytic leukemia of B-cell type not having achieved remission: Secondary | ICD-10-CM | POA: Insufficient documentation

## 2020-10-07 DIAGNOSIS — E559 Vitamin D deficiency, unspecified: Secondary | ICD-10-CM | POA: Insufficient documentation

## 2020-10-07 DIAGNOSIS — J90 Pleural effusion, not elsewhere classified: Secondary | ICD-10-CM | POA: Insufficient documentation

## 2020-10-07 DIAGNOSIS — Z9011 Acquired absence of right breast and nipple: Secondary | ICD-10-CM | POA: Insufficient documentation

## 2020-10-07 LAB — VITAMIN B12: Vitamin B-12: 281 pg/mL (ref 180–914)

## 2020-10-07 LAB — FOLATE: Folate: 11.6 ng/mL (ref >5.9)

## 2020-10-07 LAB — COMPREHENSIVE METABOLIC PANEL
ALT: 17 U/L (ref 0–44)
AST: 23 U/L (ref 15–41)
Albumin: 4.1 g/dL (ref 3.5–5.0)
Alkaline Phosphatase: 48 U/L (ref 38–126)
Anion gap: 10 (ref 5–15)
BUN: 26 mg/dL — ABNORMAL HIGH (ref 8–23)
CO2: 23 mmol/L (ref 22–32)
Calcium: 9.3 mg/dL (ref 8.9–10.3)
Chloride: 107 mmol/L (ref 98–111)
Creatinine, Ser: 0.91 mg/dL (ref 0.44–1.00)
GFR, Estimated: >60 mL/min (ref >60)
Glucose, Bld: 131 mg/dL — ABNORMAL HIGH (ref 70–99)
Potassium: 4.2 mmol/L (ref 3.5–5.1)
Sodium: 140 mmol/L (ref 135–145)
Total Bilirubin: 0.9 mg/dL (ref 0.3–1.2)
Total Protein: 6.6 g/dL (ref 6.5–8.1)

## 2020-10-07 LAB — LACTATE DEHYDROGENASE: LDH: 138 U/L (ref 98–192)

## 2020-10-07 LAB — CBC WITH DIFFERENTIAL/PLATELET
Abs Immature Granulocytes: 0.04 10*3/uL (ref 0.00–0.07)
Basophils Absolute: 0.2 10*3/uL — ABNORMAL HIGH (ref 0.0–0.1)
Basophils Relative: 1 %
Eosinophils Absolute: 0.2 10*3/uL (ref 0.0–0.5)
Eosinophils Relative: 1 %
HCT: 39.7 % (ref 36.0–46.0)
Hemoglobin: 12.7 g/dL (ref 12.0–15.0)
Immature Granulocytes: 0 %
Lymphocytes Relative: 83 %
Lymphs Abs: 30.2 10*3/uL — ABNORMAL HIGH (ref 0.7–4.0)
MCH: 30.2 pg (ref 26.0–34.0)
MCHC: 32 g/dL (ref 30.0–36.0)
MCV: 94.3 fL (ref 80.0–100.0)
Monocytes Absolute: 1.5 10*3/uL — ABNORMAL HIGH (ref 0.1–1.0)
Monocytes Relative: 4 %
Neutro Abs: 4 10*3/uL (ref 1.7–7.7)
Neutrophils Relative %: 11 %
Platelets: 191 10*3/uL (ref 150–400)
RBC: 4.21 MIL/uL (ref 3.87–5.11)
RDW: 14.6 % (ref 11.5–15.5)
WBC: 36.2 10*3/uL — ABNORMAL HIGH (ref 4.0–10.5)
nRBC: 0 % (ref 0.0–0.2)

## 2020-10-07 LAB — VITAMIN D 25 HYDROXY (VIT D DEFICIENCY, FRACTURES): Vit D, 25-Hydroxy: 48.63 ng/mL (ref 30–100)

## 2020-10-12 NOTE — Progress Notes (Signed)
[place], [place] [address]   CLINIC:  Medical Oncology/Hematology  PCP:  Lemmie Evens, MD [place] / [place] [address]  [phone]  REASON FOR VISIT:  Follow-up for CLL  PRIOR THERAPY: none  CURRENT THERAPY: surveillance  INTERVAL HISTORY:  [name], a 85 y.o. female, returns for routine follow-up for her CLL. [name] was last seen on 04/09/2020.  Today she reports feeling well. She reports RA in left knee. She reports no infections, fevers, or chills for the past 6 months. She reports some intentional weight loss at the recommendation of her cardiologist, and she has occasional hot flashes. She is not taking vitamin B-12. She takes vitamin D once weekly. She reports normal level of activities at home. She reports occasional sharp pain in her right chest at her mastectomy site.   REVIEW OF SYSTEMS:  Review of Systems  Constitutional: Negative for chills, fever and unexpected weight change.  Endocrine: Negative for hot flashes.  All other systems reviewed and are negative.   PAST MEDICAL/SURGICAL HISTORY:  Past Medical History:  Diagnosis Date  . Bowel obstruction ([facility])   . Breast cancer ([place])   . Breast cancer, stage 3 ([facility]) 07/21/2011  . Bronchitis   . CAD (coronary artery disease)    a. cath in 06/2020 showing mild to moderate nonobstructive CAD  . CHF (congestive heart failure) ([facility])    a. EF previously 55-60% in 06/2018 b. EF reduced to 40-45% by repeat echo in 03/2019 and at 35-40% by echo in 05/2020  . CLL (chronic lymphocytic leukemia) ([place]) 07/21/2011  . Migraines   . PAF (paroxysmal atrial fibrillation) ([facility])   . Pneumonia    Past Surgical History:  Procedure Laterality Date  . ABDOMINAL HYSTERECTOMY    . APPENDECTOMY    . CHOLECYSTECTOMY    . LAPAROTOMY N/A 06/08/2015   Procedure: EXPLORATORY LAPAROTOMY;  Surgeon: Aviva Signs, MD;  Location: [facility];  Service: General;  Laterality: N/A;  .  LYSIS OF ADHESION N/A 06/08/2015   Procedure: LYSIS OF ADHESION;  Surgeon: Aviva Signs, MD;  Location: [facility];  Service: General;  Laterality: N/A;  . MASTECTOMY     right  . RIGHT/LEFT HEART CATH AND CORONARY ANGIOGRAPHY N/A 07/09/2020   Procedure: RIGHT/LEFT HEART CATH AND CORONARY ANGIOGRAPHY;  Surgeon: Nelva Bush, MD;  Location: [facility];  Service: Cardiovascular;  Laterality: N/A;  . TOTAL HIP ARTHROPLASTY     right    SOCIAL HISTORY:  Social History   Socioeconomic History  . Marital status: Widowed    Spouse name: Not on file  . Number of children: Not on file  . Years of education: 70  . Highest education level: Not on file  Occupational History  . Not on file  Tobacco Use  . Smoking status: Former Smoker    Quit date: 10/26/1978    Years since quitting: 41.9  . Smokeless tobacco: Never Used  Vaping Use  . Vaping Use: Never used  Substance and Sexual Activity  . Alcohol use: No  . Drug use: No  . Sexual activity: Not Currently  Other Topics Concern  . Not on file  Social History Narrative   Pt lives by herself   Social Determinants of Health   Financial Resource Strain: Low Risk   . Difficulty of Paying Living Expenses: Not hard at all  Food Insecurity: No Food Insecurity  . Worried About Charity fundraiser in  the Last Year: Never true  . Ran Out of Food in the Last Year: Never true  Transportation Needs: No Transportation Needs  . Lack of Transportation (Medical): No  . Lack of Transportation (Non-Medical): No  Physical Activity: Inactive  . Days of Exercise per Week: 0 days  . Minutes of Exercise per Session: 0 min  Stress: Not on file  Social Connections: Socially Isolated  . Frequency of Communication with Friends and Family: More than three times a week  . Frequency of Social Gatherings with Friends and Family: Twice a week  . Attends Religious Services: Never  . Active Member of Clubs or Organizations: No  . Attends Theatre manager Meetings: Never  . Marital Status: Widowed  Intimate Partner Violence: Not At Risk  . Fear of Current or Ex-Partner: No  . Emotionally Abused: No  . Physically Abused: No  . Sexually Abused: No    FAMILY HISTORY:  Family History  Problem Relation Age of Onset  . Cancer Brother   . Cancer Other     CURRENT MEDICATIONS:  Current Outpatient Medications  Medication Sig Dispense Refill  . acetaminophen (TYLENOL) 500 MG tablet Take 1,000 mg by mouth every 6 (six) hours as needed for moderate pain or headache.    . albuterol (PROVENTIL HFA;VENTOLIN HFA) 108 (90 BASE) MCG/ACT inhaler Inhale 2 puffs into the lungs every 6 (six) hours as needed for shortness of breath.     Marland Kitchen albuterol (PROVENTIL) (2.5 MG/3ML) 0.083% nebulizer solution Take 3 mLs (2.5 mg total) by nebulization every 6 (six) hours as needed for wheezing or shortness of breath (when not relieved by inhaler.). 75 mL 12  . atorvastatin (LIPITOR) 20 MG tablet Take 1 tablet (20 mg total) by mouth daily. 90 tablet 3  . bisoprolol (ZEBETA) 10 MG tablet Take 1 tablet (10 mg total) by mouth daily. 90 tablet 3  . Blood Glucose Monitoring Suppl ([place]) w/Device KIT  (Patient not taking: Reported on 09/01/2020)    . Capsaicin-Menthol (SALONPAS GEL EX) Apply topically.    . diphenhydrAMINE (BENADRYL) 25 MG tablet Take 25 mg by mouth daily as needed for allergies.    Marland Kitchen ELIQUIS 5 MG TABS tablet TAKE 1 TABLET BY MOUTH 2 TIMES DAILY. 180 tablet 1  . ergocalciferol (VITAMIN D2) 1.25 MG (50000 UT) capsule Take 1 capsule (50,000 Units total) by mouth once a week. (Patient taking differently: Take 50,000 Units by mouth every Wednesday.) 12 capsule 1  . lisinopril (ZESTRIL) 5 MG tablet Take 1 tablet (5 mg total) by mouth daily. 90 tablet 3  . omeprazole (PRILOSEC) 20 MG capsule Take 20 mg by mouth daily.    Marland Kitchen spironolactone (ALDACTONE) 25 MG tablet Take 0.5 tablets (12.5 mg total) by mouth daily. 45 tablet 1   No  current facility-administered medications for this visit.    ALLERGIES:  Allergies  Allergen Reactions  . Codeine Nausea And Vomiting    headache  . Losartan Nausea Only    Nauseated and headaches  . Meclizine Other (See Comments)    Made dizziness worse.  . Sulfa Antibiotics Nausea And Vomiting    Stomach upset  . Zoloft [Sertraline Hcl] Anxiety    PHYSICAL EXAM:  Performance status (ECOG): 1 - Symptomatic but completely ambulatory  There were no vitals filed for this visit. Wt Readings from Last 3 Encounters:  09/01/20 171 lb (77.6 kg)  07/27/20 176 lb 3.2 oz (79.9 kg)  07/09/20 178 lb (80.7 kg)  Physical Exam Vitals reviewed.  Constitutional:      Appearance: Normal appearance.  Cardiovascular:     Rate and Rhythm: Normal rate and regular rhythm.     Pulses: Normal pulses.     Heart sounds: Normal heart sounds.  Pulmonary:     Effort: Pulmonary effort is normal.     Breath sounds: Normal breath sounds.  Chest:  Breasts:     Right: Absent. No axillary adenopathy or supraclavicular adenopathy.     Left: No inverted nipple, mass, nipple discharge, axillary adenopathy or supraclavicular adenopathy.      Comments: R chest wall normal Lymphadenopathy:     Cervical: No cervical adenopathy.     Right cervical: No superficial cervical adenopathy.    Left cervical: No superficial cervical adenopathy.     Upper Body:     Right upper body: No supraclavicular, axillary or pectoral adenopathy.     Left upper body: No supraclavicular, axillary or pectoral adenopathy.  Neurological:     General: No focal deficit present.     Mental Status: She is alert and oriented to person, place, and time.  Psychiatric:        Mood and Affect: Mood normal.        Behavior: Behavior normal.     LABORATORY DATA:  I have reviewed the labs as listed.  CBC Latest Ref Rng & Units 10/07/2020 07/09/2020 07/09/2020  WBC 4.0 - 10.5 K/uL 36.2(H) - -  Hemoglobin 12.0 - 15.0 g/dL 12.7 11.9(L)  11.9(L)  Hematocrit 36.0 - 46.0 % 39.7 35.0(L) 35.0(L)  Platelets 150 - 400 K/uL 191 - -   CMP Latest Ref Rng & Units 10/07/2020 08/03/2020 07/09/2020  Glucose 70 - 99 mg/dL 131(H) 211(H) -  BUN 8 - 23 mg/dL 26(H) 17 -  Creatinine 0.44 - 1.00 mg/dL 0.91 0.87 -  Sodium 135 - 145 mmol/L 140 139 144  Potassium 3.5 - 5.1 mmol/L 4.2 3.9 3.5  Chloride 98 - 111 mmol/L 107 107 -  CO2 22 - 32 mmol/L 23 20(L) -  Calcium 8.9 - 10.3 mg/dL 9.3 8.9 -  Total Protein 6.5 - 8.1 g/dL 6.6 - -  Total Bilirubin 0.3 - 1.2 mg/dL 0.9 - -  Alkaline Phos 38 - 126 U/L 48 - -  AST 15 - 41 U/L 23 - -  ALT 0 - 44 U/L 17 - -      Component Value Date/Time   RBC 4.21 10/07/2020 1402   MCV 94.3 10/07/2020 1402   MCV 88.9 10/12/2012 1038   MCH 30.2 10/07/2020 1402   MCHC 32.0 10/07/2020 1402   RDW 14.6 10/07/2020 1402   RDW 14.4 10/12/2012 1038   LYMPHSABS 30.2 (H) 10/07/2020 1402   LYMPHSABS 57.9 (H) 10/12/2012 1038   MONOABS 1.5 (H) 10/07/2020 1402   MONOABS 1.2 (H) 10/12/2012 1038   EOSABS 0.2 10/07/2020 1402   EOSABS 0.4 10/12/2012 1038   BASOSABS 0.2 (H) 10/07/2020 1402   BASOSABS 0.3 (H) 10/12/2012 1038    DIAGNOSTIC IMAGING:  I have independently reviewed the scans and discussed with the patient. No results found.   ASSESSMENT:  1.CLL: -Patient was diagnosed 25+ years ago. Has never required treatment. -Patient reports ongoing hot flashes and night sweats. She denies any adenopathy. No other B symptoms. -On11/07/2018 patient had a CT scan of her chest which revealed a 17 mm irregular soft tissue density along with the medial aspect of the left lung base. -CT of chest on 04/23/2019 showed interval resolution  of previously demonstrated small pleural effusions. No suspicious pulmonary nodules.   2.Stage III right breast cancer: -Patient was diagnosed in 1980 /1999. -Treated with neoadjuvant chemotherapy followed by right mastectomy then XRT.  3.  Atrial fibrillation: -Recently referred  to atrial fibrillation clinic secondary to palpitations mainly at night time. -Her bisoprolol was increased from 5 mg to 7.5 mg daily.   PLAN:  1.CLL: - She does not have any B symptoms.  No recurrent infections. - No significant lymphadenopathy or splenomegaly. - Reviewed her labs from 10/07/2020.  White count improved to 36.2 with normal hemoglobin and platelet count.  Predominantly increased absolute lymphocyte count and monocyte count.  LDH was normal. - No indication for treatment at this time.  Recommend follow-up in 6 months with repeat labs.   2.Stage III right breast cancer: - Right mastectomy site is within normal limits.  Left breast has no palpable masses.  No palpable adenopathy. - Mammogram from December 2021 was BI-RADS Category 1.   3.   Vitamin D deficiency: - Vitamin D level is 48.6.  Continue vitamin D weekly.    Orders placed this encounter:  No orders of the defined types were placed in this encounter.    Derek Jack, MD [place] [phone]   I, Thana Ates, am acting as a scribe for Dr. Derek Jack.  I, Derek Jack MD, have reviewed the above documentation for accuracy and completeness, and I agree with the above.

## 2020-10-13 ENCOUNTER — Other Ambulatory Visit (HOSPITAL_COMMUNITY): Payer: Self-pay | Admitting: *Deleted

## 2020-10-13 ENCOUNTER — Encounter (HOSPITAL_COMMUNITY): Payer: Self-pay | Admitting: Hematology

## 2020-10-13 ENCOUNTER — Inpatient Hospital Stay (HOSPITAL_BASED_OUTPATIENT_CLINIC_OR_DEPARTMENT_OTHER): Payer: Medicare HMO | Admitting: Hematology

## 2020-10-13 ENCOUNTER — Other Ambulatory Visit (HOSPITAL_COMMUNITY): Payer: Self-pay | Admitting: Hematology

## 2020-10-13 ENCOUNTER — Other Ambulatory Visit: Payer: Self-pay

## 2020-10-13 VITALS — BP 106/76 | HR 62 | Temp 97.0°F | Resp 20 | Wt 167.9 lb

## 2020-10-13 DIAGNOSIS — E559 Vitamin D deficiency, unspecified: Secondary | ICD-10-CM

## 2020-10-13 DIAGNOSIS — C9110 Chronic lymphocytic leukemia of B-cell type not having achieved remission: Secondary | ICD-10-CM

## 2020-10-13 DIAGNOSIS — Z853 Personal history of malignant neoplasm of breast: Secondary | ICD-10-CM | POA: Diagnosis not present

## 2020-10-13 DIAGNOSIS — N951 Menopausal and female climacteric states: Secondary | ICD-10-CM | POA: Diagnosis not present

## 2020-10-13 DIAGNOSIS — Z9011 Acquired absence of right breast and nipple: Secondary | ICD-10-CM | POA: Diagnosis not present

## 2020-10-13 DIAGNOSIS — Z1231 Encounter for screening mammogram for malignant neoplasm of breast: Secondary | ICD-10-CM

## 2020-10-13 DIAGNOSIS — Z90710 Acquired absence of both cervix and uterus: Secondary | ICD-10-CM | POA: Diagnosis not present

## 2020-10-13 DIAGNOSIS — Z87891 Personal history of nicotine dependence: Secondary | ICD-10-CM | POA: Diagnosis not present

## 2020-10-13 DIAGNOSIS — J90 Pleural effusion, not elsewhere classified: Secondary | ICD-10-CM | POA: Diagnosis not present

## 2020-10-13 MED ORDER — ERGOCALCIFEROL 1.25 MG (50000 UT) PO CAPS: 50000.0000 [IU] | ORAL_CAPSULE | ORAL | 3 refills | Status: DC

## 2020-10-13 NOTE — Patient Instructions (Signed)
[facility] at [facility] Discharge Instructions  You were seen today by Dr. Katragadda. He went over your recent results. Dr. Katragadda will see you back in 6 months for labs and follow up.   Thank you for choosing [facility] at [facility] to provide your oncology and hematology care.  To afford each patient quality time with our provider, please arrive at least 15 minutes before your scheduled appointment time.   If you have a lab appointment with the Cancer Center please come in thru the Main Entrance and check in at the main information desk  You need to re-schedule your appointment should you arrive 10 or more minutes late.  We strive to give you quality time with our providers, and arriving late affects you and other patients whose appointments are after yours.  Also, if you no show three or more times for appointments you may be dismissed from the clinic at the providers discretion.     Again, thank you for choosing [facility].  Our hope is that these requests will decrease the amount of time that you wait before being seen by our physicians.       _____________________________________________________________  Should you have questions after your visit to [facility], please contact our office at [phone] between the hours of 8:00 a.m. and 4:30 p.m.  Voicemails left after 4:00 p.m. will not be returned until the following business day.  For prescription refill requests, have your pharmacy contact our office and allow 72 hours.    Cancer Center Support Programs:   > Cancer Support Group  2nd Tuesday of the month 1pm-2pm, Journey Room    

## 2020-10-14 ENCOUNTER — Ambulatory Visit (HOSPITAL_COMMUNITY): Payer: Medicare HMO | Admitting: Physician Assistant

## 2020-10-21 ENCOUNTER — Other Ambulatory Visit: Payer: Self-pay

## 2020-10-21 ENCOUNTER — Ambulatory Visit (INDEPENDENT_AMBULATORY_CARE_PROVIDER_SITE_OTHER): Payer: Medicare HMO | Admitting: Student

## 2020-10-21 ENCOUNTER — Encounter: Payer: Self-pay | Admitting: Student

## 2020-10-21 VITALS — BP 132/84 | HR 62 | Ht 61.0 in | Wt 166.0 lb

## 2020-10-21 DIAGNOSIS — C9110 Chronic lymphocytic leukemia of B-cell type not having achieved remission: Secondary | ICD-10-CM

## 2020-10-21 DIAGNOSIS — I350 Nonrheumatic aortic (valve) stenosis: Secondary | ICD-10-CM | POA: Diagnosis not present

## 2020-10-21 DIAGNOSIS — I2510 Atherosclerotic heart disease of native coronary artery without angina pectoris: Secondary | ICD-10-CM

## 2020-10-21 DIAGNOSIS — Z79899 Other long term (current) drug therapy: Secondary | ICD-10-CM | POA: Diagnosis not present

## 2020-10-21 DIAGNOSIS — I5042 Chronic combined systolic (congestive) and diastolic (congestive) heart failure: Secondary | ICD-10-CM | POA: Diagnosis not present

## 2020-10-21 DIAGNOSIS — I480 Paroxysmal atrial fibrillation: Secondary | ICD-10-CM | POA: Diagnosis not present

## 2020-10-21 MED ORDER — BISOPROLOL FUMARATE 5 MG PO TABS: 5.0000 mg | ORAL_TABLET | Freq: Every day | ORAL | 3 refills | Status: DC

## 2020-10-21 MED ORDER — SPIRONOLACTONE 25 MG PO TABS: 25.0000 mg | ORAL_TABLET | Freq: Every day | ORAL | 3 refills | Status: DC

## 2020-10-21 NOTE — Progress Notes (Signed)
Cardiology Office Note    Date:  10/21/2020   ID:  [name], DOB 24-Mar-1936, MRN [id number]  PCP:  Lemmie Evens, MD  Cardiologist: Dorris Carnes, MD    Chief Complaint  Patient presents with  . Follow-up    2 month visit    History of Present Illness:    [name] is a 85 y.o. female with past medical history ofparoxysmal atrial fibrillation,chronic combined systolic and diastolic CHF/NICM (EF previously 55-60% in 06/2018, reduced to 40-45% by repeat echo in 03/2019 and at 35-40% by echo in 05/2020),CAD (cath in 06/2020 showing mild to moderate nonobstructive CAD), aortic stenosis,LBBB,CLL and COPD  who presents to the office today for 52-monthfollow-up.  She was examined by myself in 08/2020 and reported previously having dizziness with Lisinopril but had divided out the time that she would take this along with her Bisoprolol and Spironolactone and was tolerating well at that time. Her BP was at 92/62 during her visit and did not allow for transitioning Lisinopril to Entresto. Was recommended to obtain a follow-up echocardiogram in a few months for reassessment and could consider adding an SGLT2 inhibitor.  In talking with the patient today, she reports overall doing well since her last office visit. She has increased her activity level as she is now dog sitting during the week and reports this provides companionship along with increasing her activity level as she walks the dog several times each day. Her breathing has improved and she denies any associated chest pain or palpitations. Feels like her atrial fibrillation is not occurring as frequently as before. No reported orthopnea, PND or lower extremity edema. No recent dizziness or presyncope. She reports her blood pressure has overall been well controlled when checked at home but by review of her medication list, she has been taking Bisoprolol 5 mg daily instead of 10 mg daily.   Past Medical History:  Diagnosis  Date  . Bowel obstruction ([facility]   . Breast cancer ([facility]   . Breast cancer, stage 3 ([place] 07/21/2011  . Bronchitis   . CAD (coronary artery disease)    a. cath in 06/2020 showing mild to moderate nonobstructive CAD  . CHF (congestive heart failure) ([facility]    a. EF previously 55-60% in 06/2018 b. EF reduced to 40-45% by repeat echo in 03/2019 and at 35-40% by echo in 05/2020  . CLL (chronic lymphocytic leukemia) ([facility] 07/21/2011  . Migraines   . PAF (paroxysmal atrial fibrillation) ([facility]   . Pneumonia     Past Surgical History:  Procedure Laterality Date  . ABDOMINAL HYSTERECTOMY    . APPENDECTOMY    . CHOLECYSTECTOMY    . LAPAROTOMY N/A 06/08/2015   Procedure: EXPLORATORY LAPAROTOMY;  Surgeon: MAviva Signs MD;  Location: [facility];  Service: General;  Laterality: N/A;  . LYSIS OF ADHESION N/A 06/08/2015   Procedure: LYSIS OF ADHESION;  Surgeon: MAviva Signs MD;  Location: [facility];  Service: General;  Laterality: N/A;  . MASTECTOMY     right  . RIGHT/LEFT HEART CATH AND CORONARY ANGIOGRAPHY N/A 07/09/2020   Procedure: RIGHT/LEFT HEART CATH AND CORONARY ANGIOGRAPHY;  Surgeon: ENelva Bush MD;  [facility];  Service: Cardiovascular;  Laterality: N/A;  . TOTAL HIP ARTHROPLASTY     right    Current Medications: Outpatient Medications Prior to Visit  Medication Sig Dispense Refill  . acetaminophen (TYLENOL) 500 MG tablet Take 1,000 mg by mouth every 6 (six) hours as needed for moderate  pain or headache.    . albuterol (PROVENTIL HFA;VENTOLIN HFA) 108 (90 BASE) MCG/ACT inhaler Inhale 2 puffs into the lungs every 6 (six) hours as needed for shortness of breath.     Marland Kitchen albuterol (PROVENTIL) (2.5 MG/3ML) 0.083% nebulizer solution Take 3 mLs (2.5 mg total) by nebulization every 6 (six) hours as needed for wheezing or shortness of breath (when not relieved by inhaler.). 75 mL 12  . atorvastatin (LIPITOR) 20 MG tablet Take 1 tablet (20 mg total) by mouth daily. 90 tablet 3  .  Blood Glucose Monitoring Suppl ([place] FLEX SYSTEM) w/Device KIT     . Capsaicin-Menthol (SALONPAS GEL EX) Apply topically.    . diphenhydrAMINE (BENADRYL) 25 MG tablet Take 25 mg by mouth daily as needed for allergies.    Marland Kitchen ELIQUIS 5 MG TABS tablet TAKE 1 TABLET BY MOUTH 2 TIMES DAILY. 180 tablet 1  . ergocalciferol (VITAMIN D2) 1.25 MG (50000 UT) capsule Take 1 capsule (50,000 Units total) by mouth every Wednesday. 12 capsule 3  . lisinopril (ZESTRIL) 5 MG tablet Take 1 tablet (5 mg total) by mouth daily. 90 tablet 3  . omeprazole (PRILOSEC) 20 MG capsule Take 20 mg by mouth daily.    . bisoprolol (ZEBETA) 10 MG tablet Take 1 tablet (10 mg total) by mouth daily. 90 tablet 3  . spironolactone (ALDACTONE) 25 MG tablet Take 0.5 tablets (12.5 mg total) by mouth daily. 45 tablet 1   No facility-administered medications prior to visit.     Allergies:   Codeine, Losartan, Meclizine, Sulfa antibiotics, and Zoloft [sertraline hcl]   Social History   Socioeconomic History  . Marital status: Widowed    Spouse name: Not on file  . Number of children: Not on file  . Years of education: 65  . Highest education level: Not on file  Occupational History  . Not on file  Tobacco Use  . Smoking status: Former Smoker    Quit date: 10/26/1978    Years since quitting: 42.0  . Smokeless tobacco: Never Used  Vaping Use  . Vaping Use: Never used  Substance and Sexual Activity  . Alcohol use: No  . Drug use: No  . Sexual activity: Not Currently  Other Topics Concern  . Not on file  Social History Narrative   Pt lives by herself   Social Determinants of Health   Financial Resource Strain: Low Risk   . Difficulty of Paying Living Expenses: Not hard at all  Food Insecurity: No Food Insecurity  . Worried About Charity fundraiser in the Last Year: Never true  . Ran Out of Food in the Last Year: Never true  Transportation Needs: No Transportation Needs  . Lack of Transportation (Medical): No   . Lack of Transportation (Non-Medical): No  Physical Activity: Inactive  . Days of Exercise per Week: 0 days  . Minutes of Exercise per Session: 0 min  Stress: Not on file  Social Connections: Socially Isolated  . Frequency of Communication with Friends and Family: More than three times a week  . Frequency of Social Gatherings with Friends and Family: Twice a week  . Attends Religious Services: Never  . Active Member of Clubs or Organizations: No  . Attends Archivist Meetings: Never  . Marital Status: Widowed     Family History:  The patient's family history includes Cancer in her brother and another family member.   Review of Systems:    Please see the history of  present illness.     All other systems reviewed and are otherwise negative except as noted above.   Physical Exam:    VS:  BP 132/84   Pulse 62   Ht 5' 1"  (1.549 m)   Wt 166 lb (75.3 kg)   SpO2 98%   BMI 31.37 kg/m    General: Pleasant elderly female appearing in no acute distress. Head: Normocephalic, atraumatic. Neck: No carotid bruits. JVD not elevated.  Lungs: Respirations regular and unlabored, without wheezes or rales.  Heart: Regular rate and rhythm. No S3 or S4.  2/6 SEM along RUSB.  Abdomen: Appears non-distended. No obvious abdominal masses. Msk:  Strength and tone appear normal for age. No obvious joint deformities or effusions. Extremities: No clubbing or cyanosis. No pitting edema.  Distal pedal pulses are 2+ bilaterally. Neuro: Alert and oriented X 3. Moves all extremities spontaneously. No focal deficits noted. Psych:  Responds to questions appropriately with a normal affect. Skin: No rashes or lesions noted  Wt Readings from Last 3 Encounters:  10/21/20 166 lb (75.3 kg)  10/13/20 167 lb 14.4 oz (76.2 kg)  09/01/20 171 lb (77.6 kg)     Studies/Labs Reviewed:   EKG:  EKG is not ordered today.    Recent Labs: 01/15/2020: TSH 2.070 10/07/2020: ALT 17; BUN 26; Creatinine, Ser  0.91; Hemoglobin 12.7; Platelets 191; Potassium 4.2; Sodium 140   Lipid Panel    Component Value Date/Time   CHOL 197 01/15/2020 1312   TRIG 134 01/15/2020 1312   HDL 43 01/15/2020 1312   CHOLHDL 4.6 01/15/2020 1312   VLDL 27 01/15/2020 1312   LDLCALC 127 (H) 01/15/2020 1312    Additional studies/ records that were reviewed today include:   Echocardiogram: 05/2020 IMPRESSIONS    1. Left ventricular ejection fraction, by estimation, is 35 to 40%. The  left ventricle has moderately decreased function. The left ventricle  demonstrates regional wall motion abnormalities (see scoring  diagram/findings for description). There is mild  left ventricular hypertrophy. Left ventricular diastolic parameters are  consistent with Grade I diastolic dysfunction (impaired relaxation).  Elevated left atrial pressure.  2. Right ventricular systolic function is normal. The right ventricular  size is normal. There is normal pulmonary artery systolic pressure.  3. Left atrial size was severely dilated.  4. The pericardial effusion is circumferential.  5. The mitral valve is normal in structure. No evidence of mitral valve  regurgitation. No evidence of mitral stenosis.  6. The aortic valve is tricuspid. There is moderate calcification of the  aortic valve. There is moderate thickening of the aortic valve. Aortic  valve regurgitation is not visualized. Mild to moderate aortic valve  stenosis.  7. The inferior vena cava is normal in size with greater than 50%  respiratory variability, suggesting right atrial pressure of 3 mmHg.   Cardiac Catheterization: 06/2020 Conclusions: 1. Mild to moderate, non-obstructive coronary artery disease with 30-40% mid LAD stenosis and mild luminal irregularities involving the LCx and RCA. 2. Severely reduced left ventricular systolic function (LVEF 01-09%). 3. Upper normal left heart, right heart, and pulmonary artery pressures. 4. Normal Fick cardiac  output/index. 5. Mild aortic stenosis.  Recommendations: 1. Medical therapy and risk factor modification to prevent progression of coronary artery disease. 2. Escalate goal directed medical therapy for treatment of nonischemic cardiomyopathy, as tolerated. 3. Restart apixaban tomorrow morning if no evidence of bleeding/vascular complication.     Assessment:    1. Chronic combined systolic and diastolic heart failure ([place])  2. PAF (paroxysmal atrial fibrillation) ([place])   3. Medication management   4. Non-obstructive Coronary artery disease involving native coronary artery of native heart without angina pectoris   5. CLL (chronic lymphocytic leukemia) ([place])   6. Aortic valve stenosis, etiology of cardiac valve disease unspecified      Plan:   In order of problems listed above:  1. Chronic Combined Systolic and Diastolic CHF/NICM - Her EF was at 35 to 40% by most recent echocardiogram in 05/2020 but estimated at 25 to 35% by cardiac catheterization in 06/2020. - She denies any recent dyspnea on exertion, orthopnea, PND or lower extremity edema.  Appears euvolemic by examination today.  - She is currently on Bisoprolol 5 mg daily, Lisinopril 5 mg daily and Spironolactone 12.5 mg daily.  Was previously not on Entresto given issues with hypotension. Will attempt to titrate Spironolactone to 25 mg daily with plans for a follow-up BMET in 2 weeks. Will plan to obtain a follow-up limited echocardiogram in 2 months for reassessment of her EF along with her aortic stenosis. If EF remains reduced, would plan to add an SGLT2 inhibitor or consider Entresto if BP allows and this was reviewed with the patient today.  2. Paroxysmal atrial fibrillation - She reports her palpitations have decreased in frequency and severity. She has reduced her Bisoprolol to 5 mg daily due to being provided with a different Rx from her pharmacy. Given her heart rate in the 50's to 60's during today's visit, I  recommended continuing on this dose currently. - Continue Eliquis 5 mg twice daily for anticoagulation.  3. CAD - Cath in 06/2020 showed mild to moderate nonobstructive CAD. - She denies any recent anginal symptoms. Continue Atorvastatin 20 mg daily and Bisoprolol 5 mg daily. No ASA given the need for anticoagulation  4. CLL - WBC had improved to 36.2 by recent labs in 09/2020. Followed by Dr. Delton Coombes.   5. Aortic Stenosis -This was mild to moderate by echocardiogram in 05/2020.  Would plan to reassess by echocardiogram in 2 months.   Medication Adjustments/Labs and Tests Ordered: Current medicines are reviewed at length with the patient today.  Concerns regarding medicines are outlined above.  Medication changes, Labs and Tests ordered today are listed in the Patient Instructions below. Patient Instructions  Medication Instructions:    INCREASE Spironolactone to 25 mg daily   *If you need a refill on your cardiac medications before your next appointment, please call your pharmacy*   Lab Work: BMET in 2 weeks (11/04/20)    If you have labs (blood work) drawn today and your tests are completely normal, you will receive your results only by: Marland Kitchen MyChart Message (if you have MyChart) OR . A paper copy in the mail If you have any lab test that is abnormal or we need to change your treatment, we will call you to review the results.   Testing/Procedures: LIMITED echocardiogram in 2 months   Follow-Up: At [facility], you and your health needs are our priority.  As part of our continuing mission to provide you with exceptional heart care, we have created designated Provider Care Teams.  These Care Teams include your primary Cardiologist (physician) and Advanced Practice Providers (APPs -  Physician Assistants and Nurse Practitioners) who all work together to provide you with the care you need, when you need it.  We recommend signing up for the patient portal called "MyChart".   Sign up information is provided on this After Visit Summary.  MyChart is used to connect with patients for Virtual Visits (Telemedicine).  Patients are able to view lab/test results, encounter notes, upcoming appointments, etc.  Non-urgent messages can be sent to your provider as well.   To learn more about what you can do with MyChart, go to [email or web address].    Your next appointment:   2 month(s)  The format for your next appointment:   In Person  Provider:  You may see Dorris Carnes, MD or one of the following Advanced Practice Providers on your designated Care Team:    Bernerd Pho, PA-C       Other Instructions      Signed, Waynetta Pean  10/21/2020 5:01 PM    [place] [address] [place], [place] [address] Phone: [phone] Fax: [phone]

## 2020-10-21 NOTE — Patient Instructions (Addendum)
Medication Instructions:    INCREASE Spironolactone to 25 mg daily   *If you need a refill on your cardiac medications before your next appointment, please call your pharmacy*   Lab Work: BMET in 2 weeks (11/04/20)    If you have labs (blood work) drawn today and your tests are completely normal, you will receive your results only by: Marland Kitchen MyChart Message (if you have MyChart) OR . A paper copy in the mail If you have any lab test that is abnormal or we need to change your treatment, we will call you to review the results.   Testing/Procedures: LIMITED echocardiogram in 2 months   Follow-Up: At [facility], you and your health needs are our priority.  As part of our continuing mission to provide you with exceptional heart care, we have created designated Provider Care Teams.  These Care Teams include your primary Cardiologist (physician) and Advanced Practice Providers (APPs -  Physician Assistants and Nurse Practitioners) who all work together to provide you with the care you need, when you need it.  We recommend signing up for the patient portal called "MyChart".  Sign up information is provided on this After Visit Summary.  MyChart is used to connect with patients for Virtual Visits (Telemedicine).  Patients are able to view lab/test results, encounter notes, upcoming appointments, etc.  Non-urgent messages can be sent to your provider as well.   To learn more about what you can do with MyChart, go to [email or web address].    Your next appointment:   2 month(s)  The format for your next appointment:   In Person  Provider:  You may see Dorris Carnes, MD or one of the following Advanced Practice Providers on your designated Care Team:    Bernerd Pho, [place]       Other Instructions

## 2020-10-30 IMAGING — MG DIGITAL SCREENING UNILATERAL LEFT MAMMOGRAM WITH CAD AND TOMO
4 series · 4 of 12 positions shown · non-contrast
Comparison: Previous exam(s).

CLINICAL DATA: Screening.

EXAM:
DIGITAL SCREENING UNILATERAL LEFT MAMMOGRAM WITH CAD AND TOMO

[L CC synth-2D]
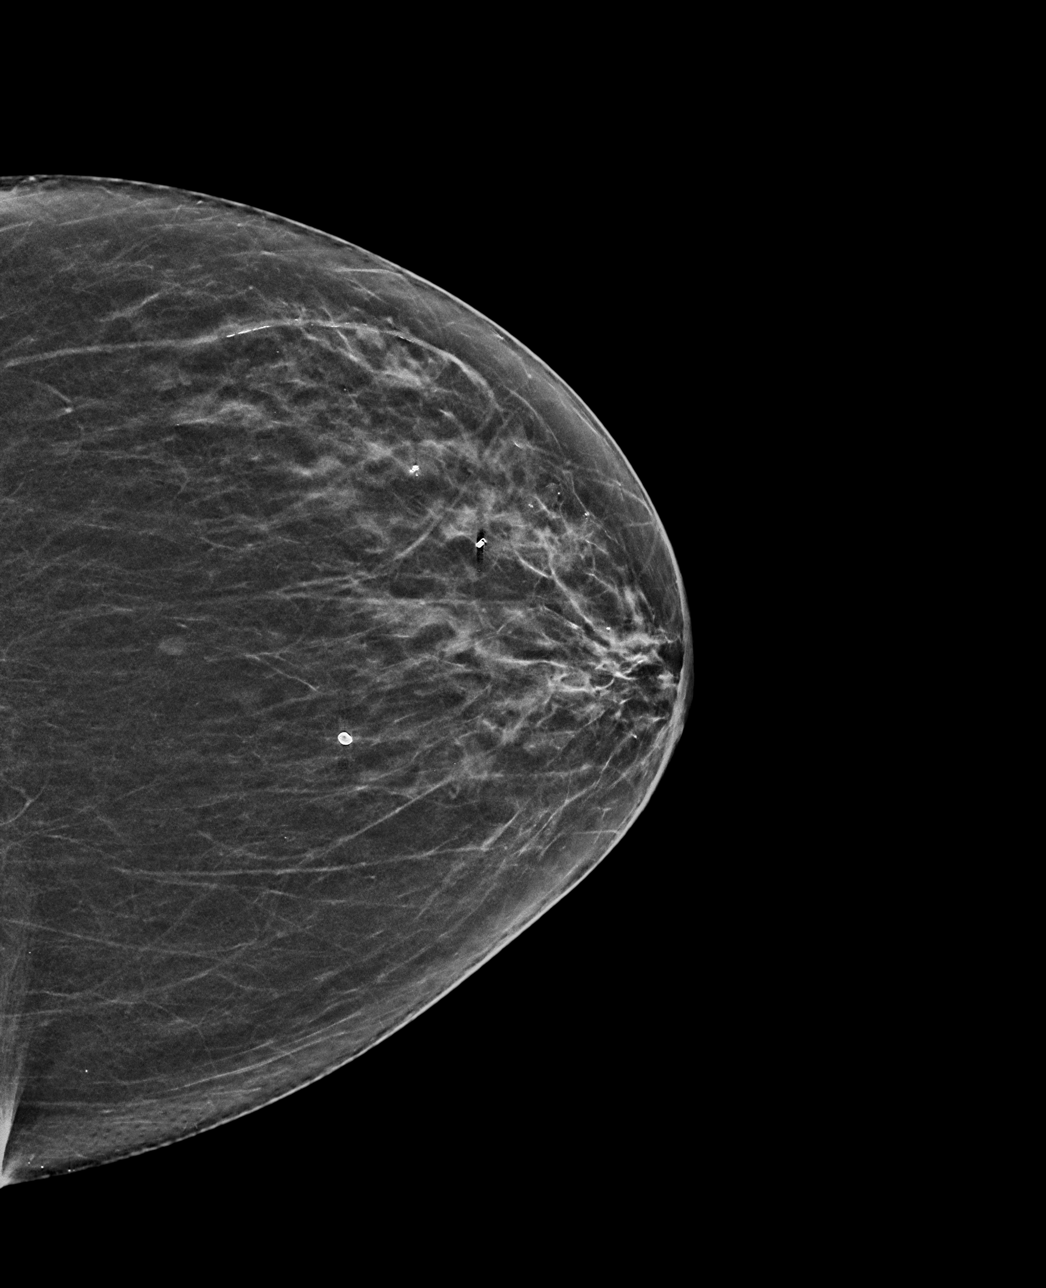

[L MLO synth-2D]
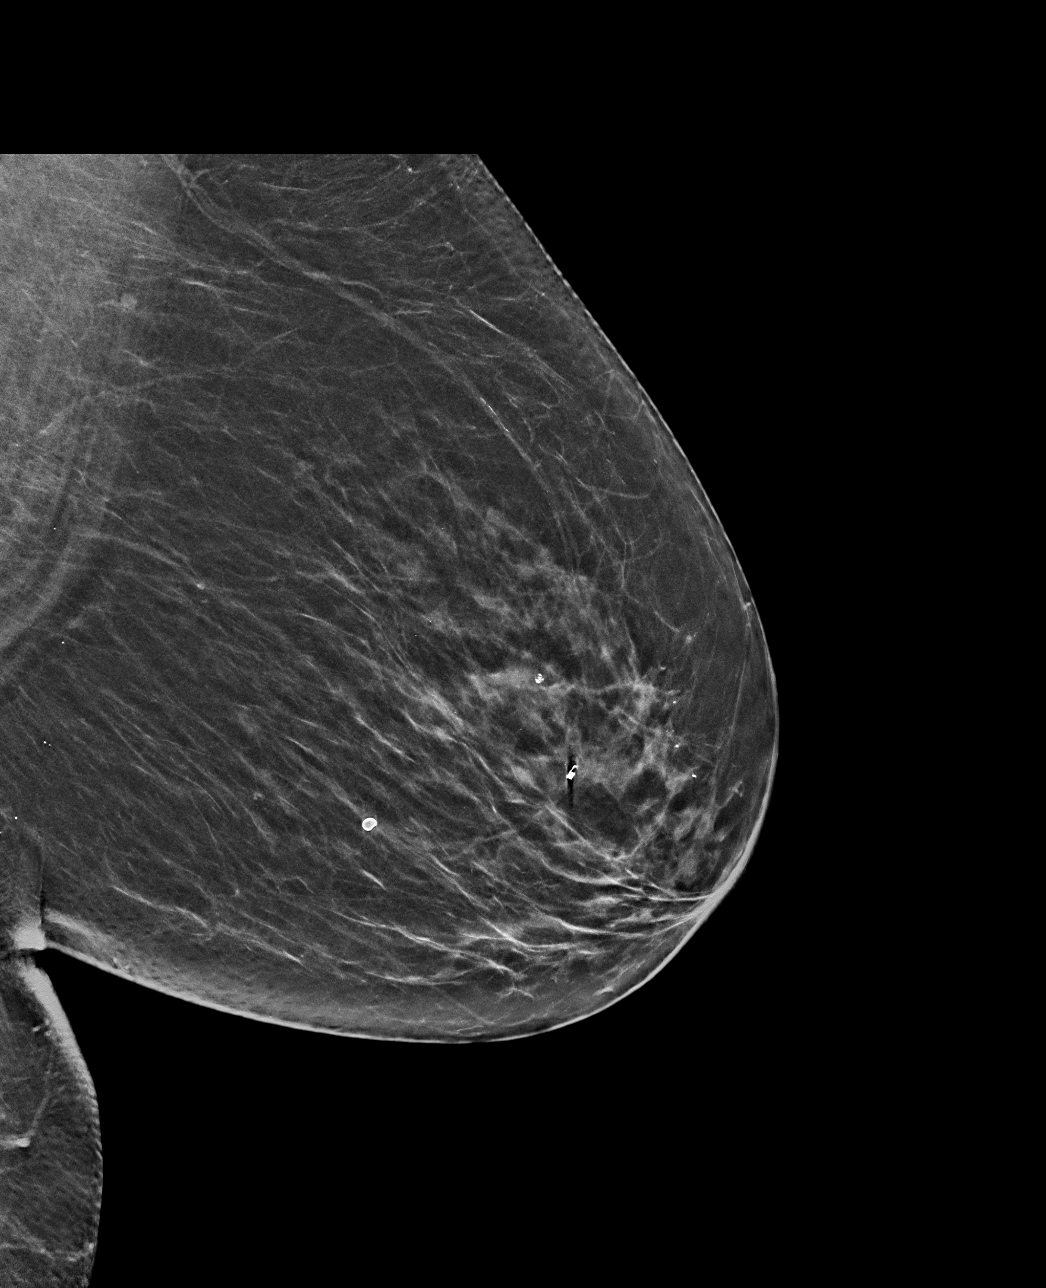

[L MLO tomo · tomo slice 29/58.0]
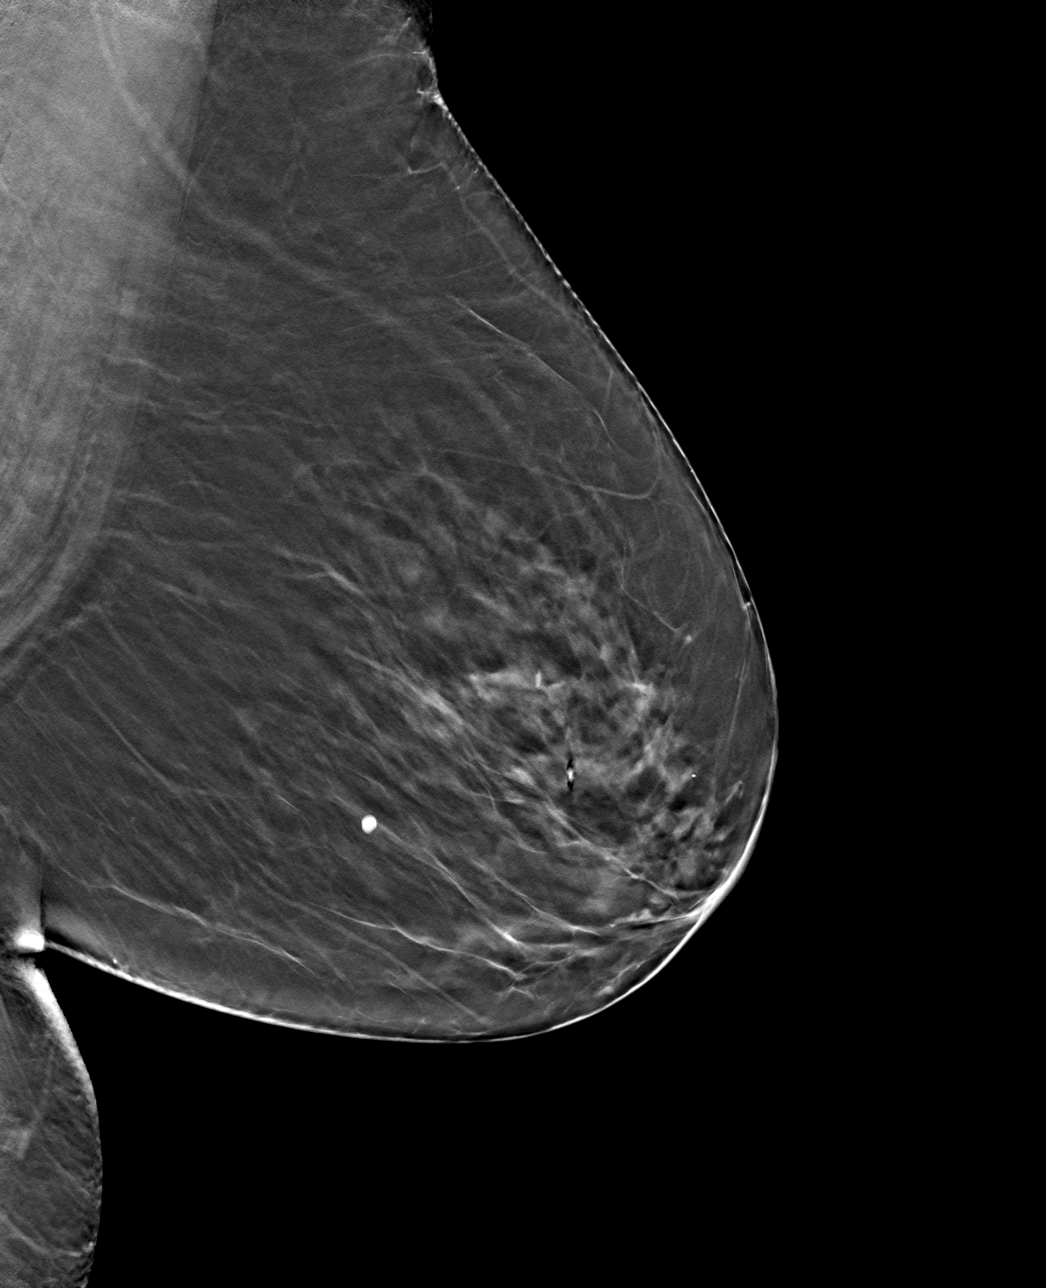

[L CC tomo · tomo slice 27/54.0]
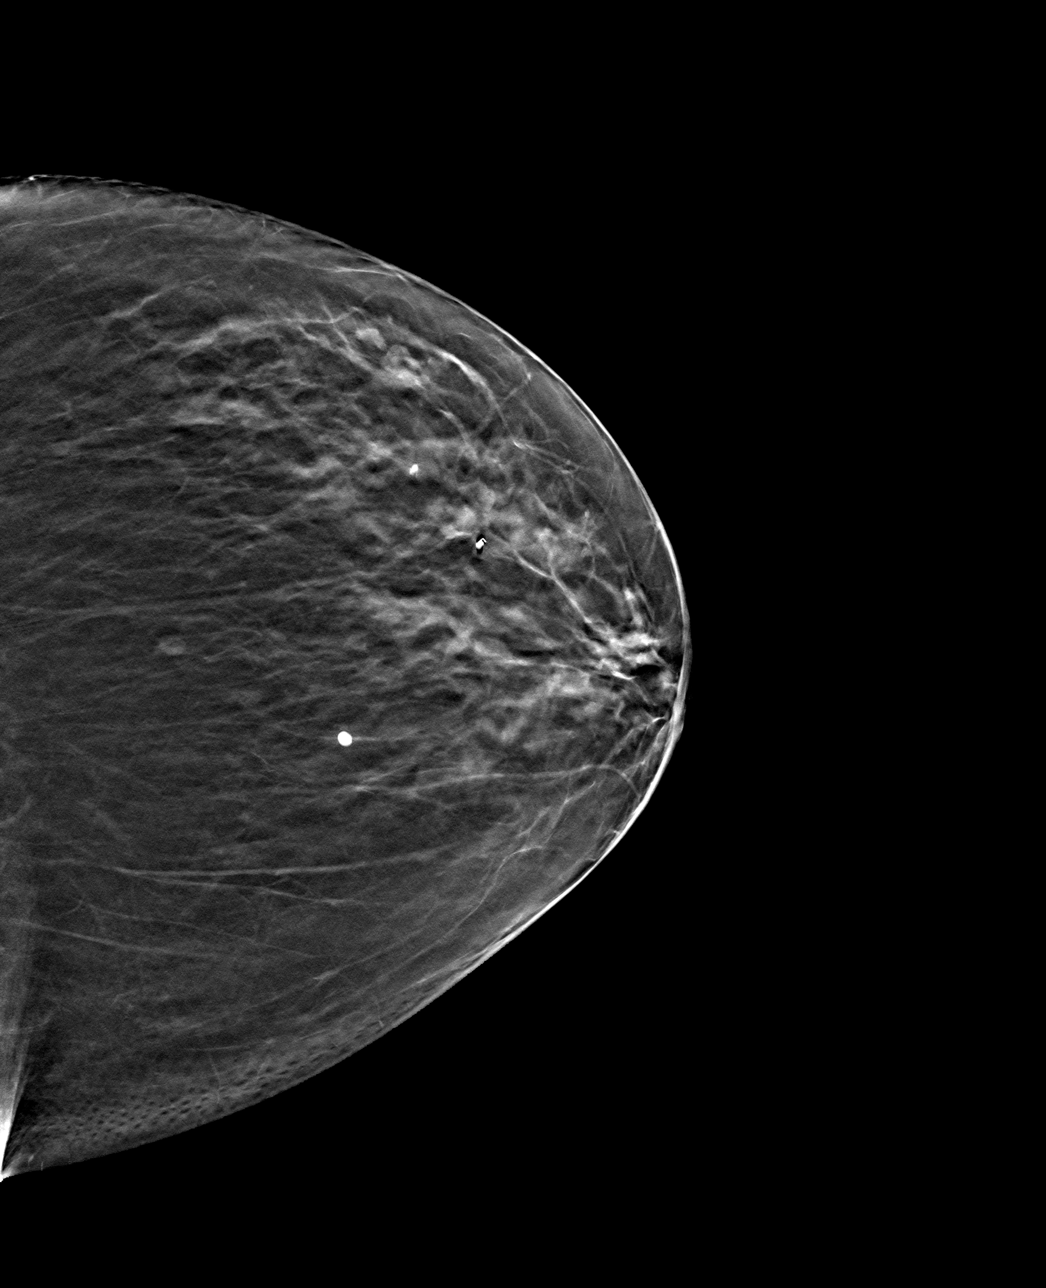

[4 of 12 positions shown; findings below may reference images not displayed]

ACR Breast Density Category b: There are scattered areas of
fibroglandular density.
FINDINGS: There are no findings suspicious for malignancy. Images were
processed with CAD.
IMPRESSION: No mammographic evidence of malignancy. A result letter of this
screening mammogram will be mailed directly to the patient.

RECOMMENDATION:
Screening mammogram in one year. (Code:51-Y-CJA)

BI-RADS CATEGORY  1: Negative.

## 2020-11-02 DIAGNOSIS — M79671 Pain in right foot: Secondary | ICD-10-CM | POA: Diagnosis not present

## 2020-11-02 DIAGNOSIS — R931 Abnormal findings on diagnostic imaging of heart and coronary circulation: Secondary | ICD-10-CM | POA: Diagnosis not present

## 2020-11-02 DIAGNOSIS — I952 Hypotension due to drugs: Secondary | ICD-10-CM | POA: Diagnosis not present

## 2020-11-02 DIAGNOSIS — C9110 Chronic lymphocytic leukemia of B-cell type not having achieved remission: Secondary | ICD-10-CM | POA: Diagnosis not present

## 2020-11-26 ENCOUNTER — Ambulatory Visit: Payer: Medicare HMO | Admitting: Internal Medicine

## 2020-12-21 ENCOUNTER — Ambulatory Visit (HOSPITAL_COMMUNITY)
Admission: RE | Admit: 2020-12-21 | Discharge: 2020-12-21 | Disposition: A | Discharge status: Home / Self Care | Payer: Medicare HMO | Source: Ambulatory Visit | Attending: Student | Admitting: Student

## 2020-12-21 ENCOUNTER — Other Ambulatory Visit: Payer: Self-pay

## 2020-12-21 DIAGNOSIS — I5042 Chronic combined systolic (congestive) and diastolic (congestive) heart failure: Secondary | ICD-10-CM | POA: Diagnosis not present

## 2020-12-21 LAB — ECHOCARDIOGRAM LIMITED
AR max vel: 1.78 cm2
AV Area VTI: 1.74 cm2
AV Area mean vel: 1.84 cm2
AV Mean grad: 10.5 mmHg
AV Peak grad: 18.4 mmHg
Ao pk vel: 2.15 m/s
Area-P 1/2: 1.57 cm2
Calc EF: 39.6 %
S' Lateral: 4.7 cm
Single Plane A2C EF: 39 %
Single Plane A4C EF: 39.3 %

## 2020-12-21 NOTE — Progress Notes (Signed)
*  PRELIMINARY RESULTS* Echocardiogram Limited 2-D Echocardiogram  has been performed.  [name] 12/21/2020, 3:00 PM

## 2020-12-25 ENCOUNTER — Encounter: Payer: Self-pay | Admitting: Student

## 2020-12-25 ENCOUNTER — Other Ambulatory Visit: Payer: Self-pay

## 2020-12-25 ENCOUNTER — Ambulatory Visit: Payer: Medicare HMO | Admitting: Student

## 2020-12-25 VITALS — BP 106/72 | HR 62 | Ht 61.0 in | Wt 167.0 lb

## 2020-12-25 DIAGNOSIS — C9110 Chronic lymphocytic leukemia of B-cell type not having achieved remission: Secondary | ICD-10-CM

## 2020-12-25 DIAGNOSIS — I350 Nonrheumatic aortic (valve) stenosis: Secondary | ICD-10-CM

## 2020-12-25 DIAGNOSIS — I480 Paroxysmal atrial fibrillation: Secondary | ICD-10-CM | POA: Diagnosis not present

## 2020-12-25 DIAGNOSIS — I2510 Atherosclerotic heart disease of native coronary artery without angina pectoris: Secondary | ICD-10-CM

## 2020-12-25 DIAGNOSIS — I5042 Chronic combined systolic (congestive) and diastolic (congestive) heart failure: Secondary | ICD-10-CM

## 2020-12-25 MED ORDER — EMPAGLIFLOZIN 10 MG PO TABS: 10.0000 mg | ORAL_TABLET | Freq: Every day | ORAL | 11 refills | Status: DC

## 2020-12-25 NOTE — Progress Notes (Signed)
Cardiology Office Note    Date:  12/25/2020   ID:  [name], DOB December 27, 1935, MRN [id number]  PCP:  Lemmie Evens, MD  Cardiologist: Dorris Carnes, MD    Chief Complaint  Patient presents with   Follow-up    Review results of recent echo    History of Present Illness:    [name] is a 85 y.o. female with past medical history of paroxysmal atrial fibrillation, HFrEF/NICM (EF previously 55-60% in 06/2018, reduced to 40-45% by repeat echo in 03/2019 and at 35-40% by echo in 05/2020), CAD (cath in 06/2020 showing mild to moderate nonobstructive CAD), aortic stenosis, LBBB, CLL and COPD who presents to the office today for follow-up from her recent echocardiogram.   She was examined by myself in 10/2020 reported overall doing well at that time and felt like her breathing had improved. She denied any recent chest pain or palpitations. She was continued on Bisoprolol 5 mg daily, Lisinopril 5 mg daily and Spironolactone as BP had not previously allowed for switching to [facility]. Spironolactone was titrated to 25 mg daily at the time of her visit with plans to obtain a follow-up limited echocardiogram in 2 months and if EF remained reduced, would plan to add an SGLT2 inhibitor or consider Entresto if BP allowed.  Repeat echocardiogram in 12/21/2020 showed her EF remained reduced at 35 to 40% with the mid and distal anterior septum and apex being akinetic. RV function was normal and she did have mild MR and mild aortic stenosis.  In talking with the patient today, she reports feeling very well since her last visit. She reports her breathing has been stable and denies any dyspnea on exertion, orthopnea, PND or pitting edema. No recent chest pain or palpitations. 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  She does report having severe nausea while walking around [employer] earlier this week but denies any associated symptoms. She went home and  took a nap and symptoms spontaneously resolved. She is unaware of any dietary changes around that time which could have triggered her episode.   Past Medical History:  Diagnosis Date   Bowel obstruction ([place])    Breast cancer ([place])    Breast cancer, stage 3 ([place]) 07/21/2011   Bronchitis    CAD (coronary artery disease)    a. cath in 06/2020 showing mild to moderate nonobstructive CAD   CHF (congestive heart failure) ([place])    a. EF previously 55-60% in 06/2018 b. EF reduced to 40-45% by repeat echo in 03/2019 and at 35-40% by echo in 05/2020   CLL (chronic lymphocytic leukemia) ([place]) 07/21/2011   Migraines    PAF (paroxysmal atrial fibrillation) ([facility])    Pneumonia     Past Surgical History:  Procedure Laterality Date   ABDOMINAL HYSTERECTOMY     APPENDECTOMY     CHOLECYSTECTOMY     LAPAROTOMY N/A 06/08/2015   Procedure: EXPLORATORY LAPAROTOMY;  Surgeon: Aviva Signs, MD;  Location: [facility];  Service: General;  Laterality: N/A;   LYSIS OF ADHESION N/A 06/08/2015   Procedure: LYSIS OF ADHESION;  Surgeon: Aviva Signs, MD;  Location: [facility];  Service: General;  Laterality: N/A;   MASTECTOMY     right   RIGHT/LEFT HEART CATH AND CORONARY ANGIOGRAPHY N/A 07/09/2020   Procedure: RIGHT/LEFT HEART CATH AND CORONARY ANGIOGRAPHY;  Surgeon: Nelva Bush, MD;  Location: [facility];  Service: Cardiovascular;  Laterality: N/A;  TOTAL HIP ARTHROPLASTY     right    Current Medications: Outpatient Medications Prior to Visit  Medication Sig Dispense Refill   acetaminophen (TYLENOL) 500 MG tablet Take 1,000 mg by mouth every 6 (six) hours as needed for moderate pain or headache.     albuterol (PROVENTIL HFA;VENTOLIN HFA) 108 (90 BASE) MCG/ACT inhaler Inhale 2 puffs into the lungs every 6 (six) hours as needed for shortness of breath.      atorvastatin (LIPITOR) 20 MG tablet Take 1 tablet (20 mg total) by mouth daily. 90 tablet 3   bisoprolol (ZEBETA) 5 MG tablet Take 1 tablet (5 mg  total) by mouth daily. 90 tablet 3   Capsaicin-Menthol (SALONPAS GEL EX) Apply topically.     diphenhydrAMINE (BENADRYL) 25 MG tablet Take 25 mg by mouth daily as needed for allergies.     ELIQUIS 5 MG TABS tablet TAKE 1 TABLET BY MOUTH 2 TIMES DAILY. 180 tablet 1   ergocalciferol (VITAMIN D2) 1.25 MG (50000 UT) capsule Take 1 capsule (50,000 Units total) by mouth every Wednesday. 12 capsule 3   omeprazole (PRILOSEC) 20 MG capsule Take 20 mg by mouth daily.     spironolactone (ALDACTONE) 25 MG tablet Take 1 tablet (25 mg total) by mouth daily. 90 tablet 3   albuterol (PROVENTIL) (2.5 MG/3ML) 0.083% nebulizer solution Take 3 mLs (2.5 mg total) by nebulization every 6 (six) hours as needed for wheezing or shortness of breath (when not relieved by inhaler.). (Patient not taking: Reported on 12/25/2020) 75 mL 12   Blood Glucose Monitoring Suppl ([place]) w/Device KIT  (Patient not taking: Reported on 12/25/2020)     lisinopril (ZESTRIL) 5 MG tablet Take 1 tablet (5 mg total) by mouth daily. 90 tablet 3   No facility-administered medications prior to visit.     Allergies:   Codeine, Losartan, Meclizine, Sulfa antibiotics, and Zoloft [sertraline hcl]   Social History   Socioeconomic History   Marital status: Widowed    Spouse name: Not on file   Number of children: Not on file   Years of education: 11   Highest education level: Not on file  Occupational History   Not on file  Tobacco Use   Smoking status: Former    Types: Cigarettes    Quit date: 10/26/1978    Years since quitting: 42.1   Smokeless tobacco: Never  Vaping Use   Vaping Use: Never used  Substance and Sexual Activity   Alcohol use: No   Drug use: No   Sexual activity: Not Currently  Other Topics Concern   Not on file  Social History Narrative   Pt lives by herself   Social Determinants of Health   Financial Resource Strain: Low Risk    Difficulty of Paying Living Expenses: Not hard at all  Food  Insecurity: No Food Insecurity   Worried About Charity fundraiser in the Last Year: Never true   Arboriculturist in the Last Year: Never true  Transportation Needs: No Transportation Needs   Lack of Transportation (Medical): No   Lack of Transportation (Non-Medical): No  Physical Activity: Inactive   Days of Exercise per Week: 0 days   Minutes of Exercise per Session: 0 min  Stress: Not on file  Social Connections: Socially Isolated   Frequency of Communication with Friends and Family: More than three times a week   Frequency of Social Gatherings with Friends and Family: Twice a week   Attends  Religious Services: Never   Active Member of Clubs or Organizations: No   Attends Archivist Meetings: Never   Marital Status: Widowed     Family History:  The patient's family history includes Cancer in her brother and another family member.   Review of Systems:    Please see the history of present illness.     All other systems reviewed and are otherwise negative except as noted above.   Physical Exam:    VS:  BP 106/72   Pulse 62   Ht _0  (1.549 m)   Wt 167 lb (75.8 kg)   SpO2 97%   BMI 31.55 kg/m    General: Pleasant elderly female appearing in no acute distress. Head: Normocephalic, atraumatic. Neck: No carotid bruits. JVD not elevated.  Lungs: Respirations regular and unlabored, without wheezes or rales.  Heart: Regular rate and rhythm. No S3 or S4.  2/6 SEM along RUSB.  Abdomen: Appears non-distended. No obvious abdominal masses. Msk:  Strength and tone appear normal for age. No obvious joint deformities or effusions. Extremities: No clubbing or cyanosis. No pitting edema.  Distal pedal pulses are 2+ bilaterally. Neuro: Alert and oriented X 3. Moves all extremities spontaneously. No focal deficits noted. Psych:  Responds to questions appropriately with a normal affect. Skin: No rashes or lesions noted  Wt Readings from Last 3 Encounters:  12/25/20 167 lb  (75.8 kg)  10/21/20 166 lb (75.3 kg)  10/13/20 167 lb 14.4 oz (76.2 kg)     Studies/Labs Reviewed:   EKG:  EKG is not ordered today.   Recent Labs: 01/15/2020: TSH 2.070 10/07/2020: ALT 17; BUN 26; Creatinine, Ser 0.91; Hemoglobin 12.7; Platelets 191; Potassium 4.2; Sodium 140   Lipid Panel    Component Value Date/Time   CHOL 197 01/15/2020 1312   TRIG 134 01/15/2020 1312   HDL 43 01/15/2020 1312   CHOLHDL 4.6 01/15/2020 1312   VLDL 27 01/15/2020 1312   LDLCALC 127 (H) 01/15/2020 1312    Additional studies/ records that were reviewed today include:   Cardiac Catheterization: 07/09/2020 Conclusions: Mild to moderate, non-obstructive coronary artery disease with 30-40% mid LAD stenosis and mild luminal irregularities involving the LCx and RCA. Severely reduced left ventricular systolic function (LVEF 73-22%). Upper normal left heart, right heart, and pulmonary artery pressures. Normal Fick cardiac output/index. Mild aortic stenosis.   Recommendations: Medical therapy and risk factor modification to prevent progression of coronary artery disease. Escalate goal directed medical therapy for treatment of nonischemic cardiomyopathy, as tolerated. Restart apixaban tomorrow morning if no evidence of bleeding/vascular complication.    Echocardiogram: 12/21/2020 IMPRESSIONS     1. Left ventricular ejection fraction, by estimation, is 35 to 40%. The  left ventricle has moderately decreased function. The left ventricle  demonstrates regional wall motion abnormalities (see scoring  diagram/findings for description). The left  ventricular internal cavity size was mildly dilated. Left ventricular  diastolic parameters are consistent with Grade I diastolic dysfunction  (impaired relaxation).   2. Right ventricular systolic function is normal. The right ventricular  size is normal. Tricuspid regurgitation signal is inadequate for assessing  PA pressure.   3. Mild mitral valve  regurgitation.   4. The aortic valve is tricuspid. There is moderate calcification of the  aortic valve. Aortic valve regurgitation is not visualized. Mild aortic  valve stenosis. Aortic valve mean gradient measures 10.5 mmHg.  Dimentionless index 0.46.   5. The inferior vena cava is normal in size with greater than 50%  respiratory variability, suggesting right atrial pressure of 3 mmHg.   Assessment:    1. Chronic combined systolic and diastolic heart failure ([place])   2. PAF (paroxysmal atrial fibrillation) ([place])   3. Non-obstructive Coronary artery disease involving native coronary artery of native heart without angina pectoris   4. Aortic valve stenosis, etiology of cardiac valve disease unspecified   5. CLL (chronic lymphocytic leukemia) ([place])      Plan:   In order of problems listed above:  1. HFrEF/NICM  - Her EF remains reduced at 35 to 40% by most recent echocardiogram with the mid and distal anterior septum and apex being akinetic. - She reports her breathing has been stable and she does not appear volume overloaded by examination today. - Will continue Bisoprolol 5 mg daily, Lisinopril 5 mg daily and Spironolactone 25mg  daily. Her BP is at 106/72 during today's visit and does not allow for [place] to transition Lisinopril to Entresto. Will plan to add Jardiance 10 mg daily. If her EF further declines over time, would consider EP referral for consideration of CRT given her known LBBB but hopefully it will continue to improve with titration of medical therapy.  2. Paroxysmal Atrial Fibrillation - She denies any recent palpitations and is in normal sinus rhythm by examination today.  Continue Bisoprolol 5 mg daily for rate control. - No reports of active bleeding and she remains on Eliquis 5 mg twice daily for anticoagulation which is the appropriate dose at this time given her age, weight and kidney function.  3. CAD - She had mild to moderate nonobstructive CAD by recent  catheterization in 06/2020. She denies any recent anginal symptoms. Continue Atorvastatin 20 mg daily and Bisoprolol 5 mg daily. She is not on ASA given the need for anticoagulation  4. Aortic Stenosis - Her AS was mild by most recent echocardiogram. Will continue to follow.   5. CLL - Followed by Oncology. WBC was stable at 36.2 in 09/2020.   Medication Adjustments/Labs and Tests Ordered: Current medicines are reviewed at length with the patient today.  Concerns regarding medicines are outlined above.  Medication changes, Labs and Tests ordered today are listed in the Patient Instructions below. Patient Instructions  Medication Instructions:  Start Jardiance 10 mg Daily  You have been given samples today.   *If you need a refill on your cardiac medications before your next appointment, please call your pharmacy*   Lab Work: NONE   If you have labs (blood work) drawn today and your tests are completely normal, you will receive your results only by: [place] (if you have MyChart) OR A paper copy in the mail If you have any lab test that is abnormal or we need to change your treatment, we will call you to review the results.   Testing/Procedures: NONE    Follow-Up: At [facility], you and your health needs are our priority.  As part of our continuing mission to provide you with exceptional heart care, we have created designated Provider Care Teams.  These Care Teams include your primary Cardiologist (physician) and Advanced Practice Providers (APPs -  Physician Assistants and Nurse Practitioners) who all work together to provide you with the care you need, when you need it.  We recommend signing up for the patient portal called "MyChart".  Sign up information is provided on this After Visit Summary.  MyChart is used to connect with patients for Virtual Visits (Telemedicine).  Patients are able to view lab/test results, encounter notes,  upcoming appointments, etc.   Non-urgent messages can be sent to your provider as well.   To learn more about what you can do with MyChart, go to [email or web address].    Your next appointment:   3-4 month(s)  The format for your next appointment:   In Person  Provider:   Dorris Carnes, MD or Bernerd Pho, PA-C   Other Instructions Thank you for choosing [facility]!     Signed, Erma Heritage, PA-C  12/25/2020 5:27 PM    [facility] [address] [place], [place] [address] Phone: [phone] Fax: [phone]

## 2020-12-25 NOTE — Patient Instructions (Signed)
Medication Instructions:  Start Jardiance 10 mg Daily  You have been given samples today.   *If you need a refill on your cardiac medications before your next appointment, please call your pharmacy*   Lab Work: NONE   If you have labs (blood work) drawn today and your tests are completely normal, you will receive your results only by: [place] (if you have MyChart) OR A paper copy in the mail If you have any lab test that is abnormal or we need to change your treatment, we will call you to review the results.   Testing/Procedures: NONE    Follow-Up: At [facility], you and your health needs are our priority.  As part of our continuing mission to provide you with exceptional heart care, we have created designated Provider Care Teams.  These Care Teams include your primary Cardiologist (physician) and Advanced Practice Providers (APPs -  Physician Assistants and Nurse Practitioners) who all work together to provide you with the care you need, when you need it.  We recommend signing up for the patient portal called "MyChart".  Sign up information is provided on this After Visit Summary.  MyChart is used to connect with patients for Virtual Visits (Telemedicine).  Patients are able to view lab/test results, encounter notes, upcoming appointments, etc.  Non-urgent messages can be sent to your provider as well.   To learn more about what you can do with MyChart, go to [email or web address].    Your next appointment:   3-4 month(s)  The format for your next appointment:   In Person  Provider:   Dorris Carnes, MD or Bernerd Pho, PA-C   Other Instructions Thank you for choosing [facility]!

## 2021-01-18 ENCOUNTER — Telehealth: Payer: Self-pay

## 2021-01-18 NOTE — Telephone Encounter (Signed)
-----   Message from Bard Herbert sent at 01/18/2021  3:39 PM EDT ----- Regarding: Recent medication change side effects Patient would like to have [place] or nurse call and speak with her about some side effects from a recent medication change.

## 2021-01-18 NOTE — Telephone Encounter (Signed)
Contacted pt with no answer, no voicemail set up.

## 2021-01-19 ENCOUNTER — Other Ambulatory Visit: Payer: Self-pay | Admitting: *Deleted

## 2021-01-19 MED ORDER — EMPAGLIFLOZIN 10 MG PO TABS: 10.0000 mg | ORAL_TABLET | Freq: Every day | ORAL | 3 refills | Status: DC

## 2021-01-20 MED ORDER — LISINOPRIL 2.5 MG PO TABS: 2.5000 mg | ORAL_TABLET | Freq: Every day | ORAL | 3 refills | Status: DC

## 2021-01-20 NOTE — Telephone Encounter (Signed)
Pt states that when she takes Lisinopril 5 mg tablets at night before bedtime, she wakes the next morning with awful headaches, sore fae, and groggy with no balance. PT stated she stopped taking Lisinopril on Saturday 8/27 and has not had any problems since then. Pt verified that she is still currently taking Bisoprolol 5 mg tablets, Eliquis 5 mg tablets, and Spironolactone 25 mg tablets.   Please advise.

## 2021-01-20 NOTE — Telephone Encounter (Signed)
    She has experienced issues with hypotension in the past and it could be that this was contributing as well. If she is willing, I would recommend that she try cutting her Lisinopril tablets in half and taking 2.5 mg daily instead of 5 mg daily. Follow BP if able. If symptoms persist at 2.'5mg'$  daily dosing, will likely need to discontinue.   Signed, Erma Heritage, PA-C 01/20/2021, 11:46 AM Pager: [phone]

## 2021-01-20 NOTE — Telephone Encounter (Signed)
[name] agrees to decrease lisinopril to 2.5 mg daily and will update [place] with her symptoms and BP

## 2021-01-22 NOTE — Telephone Encounter (Signed)
Pt states that she took 1/2 tablet of Lisinopril on Wednesday night and she woke up on Thurs. With a headache. She did not take it last night and woke up this morning feels much better. She does not have a headache. Current BP is 118/72 HR 52. Please advise.

## 2021-01-22 NOTE — Telephone Encounter (Signed)
Reached voicemail, left message to return call.

## 2021-01-22 NOTE — Telephone Encounter (Signed)
Pt is calling to give update on taking the half pill of Lisinopril -   States that she had the same symptoms taking half of it as she did when she would take the whole pill. States that she was in and out of the bed all day yesterday and that she just did not feel well.   Pt states that she has not taken one this morning and she's feeling better today.   Please call [phone]

## 2021-01-22 NOTE — Telephone Encounter (Signed)
Returned call to Pt. No answer, unable to leave msg.

## 2021-01-22 NOTE — Telephone Encounter (Signed)
   Would have her to continue to hold for now if she is feeling better without it. Previously intolerant to ARB's and BP does not allow for using Entresto. Continue to follow readings and would continue Bisoprolol, Spironolactone and Jardiance for her cardiomyopathy.   Signed, Erma Heritage, PA-C 01/22/2021, 10:39 AM Pager: [phone]

## 2021-01-22 NOTE — Addendum Note (Signed)
Addended by: Barbarann Ehlers A on: 01/22/2021 12:07 PM   Modules accepted: Orders

## 2021-01-22 NOTE — Telephone Encounter (Signed)
Patient agrees to hold lisinopril for now and will watch bp's

## 2021-02-08 DIAGNOSIS — Z23 Encounter for immunization: Secondary | ICD-10-CM | POA: Diagnosis not present

## 2021-02-08 DIAGNOSIS — K2100 Gastro-esophageal reflux disease with esophagitis, without bleeding: Secondary | ICD-10-CM | POA: Diagnosis not present

## 2021-02-08 DIAGNOSIS — E6609 Other obesity due to excess calories: Secondary | ICD-10-CM | POA: Diagnosis not present

## 2021-02-08 DIAGNOSIS — C9110 Chronic lymphocytic leukemia of B-cell type not having achieved remission: Secondary | ICD-10-CM | POA: Diagnosis not present

## 2021-02-08 DIAGNOSIS — I480 Paroxysmal atrial fibrillation: Secondary | ICD-10-CM | POA: Diagnosis not present

## 2021-03-19 ENCOUNTER — Other Ambulatory Visit: Payer: Self-pay | Admitting: Internal Medicine

## 2021-03-19 NOTE — Telephone Encounter (Signed)
Prescription refill request for Eliquis received. Indication: Afib  Last office visit:12/25/20 ([place])  Scr: 0.91 (10/07/20) Age: 85 Weight: 75.8kg  Appropriate dose and refill sent to requested pharmacy.

## 2021-03-28 NOTE — Progress Notes (Signed)
Cardiology Office Note    Date:  03/29/2021   ID:  [name], DOB 07-25-1935, MRN [id number]  PCP:  Lemmie Evens, MD  Cardiologist: Dorris Carnes, MD    Patient presents for follow up of PAF and HFrEF   History of Present Illness:    [name] is a 85 y.o. female with past medical history of paroxysmal atrial fibrillation, HFrEF/NICM (EF previously 55-60% in 06/2018, reduced to 40-45% by repeat echo in 03/2019 and at 35-40% by echo in 05/2020), CAD (cath in 06/2020 showing mild to moderate nonobstructive CAD), aortic stenosis, LBBB, CLL and COPD who presents to the office today for follow-up from her recent echocardiogram.   She was examined by myself in 10/2020 reported overall doing well at that time and felt like her breathing had improved. She denied any recent chest pain or palpitations. She was continued on Bisoprolol 5 mg daily, Lisinopril 5 mg daily and Spironolactone as BP had not previously allowed for switching to [facility]. Spironolactone was titrated to 25 mg daily at the time of her visit with plans to obtain a follow-up limited echocardiogram in 2 months and if EF remained reduced, would plan to add an SGLT2 inhibitor or consider Entresto if BP allowed.  Repeat echocardiogram in 12/21/2020 showed her EF remained reduced at 35 to 40% with the mid and distal anterior septum and apex being akinetic. RV function was normal and she did have mild MR and mild aortic stenosis.  In talking with the patient today, she reports feeling very well since her last visit. She reports her breathing has been stable and denies any dyspnea on exertion, orthopnea, PND or pitting edema. No recent chest pain or palpitations. 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  She does report having severe nausea while walking around [employer] earlier this week but denies any associated symptoms. She went home and took a nap and symptoms spontaneously  resolved. She is unaware of any dietary changes around that time which could have triggered her episode.   She was seen by [facility] in Aug 2022  Patient says that after starting Jardiance she had spell fo PND    Stopped Vania Rea   has not restarted    Feels better   Otilio Miu is better    Patient also says that she stopped lisinopril, said it caused her headache  Patient notes every once in a while will get a sharp pain while stiing Past Medical History:  Diagnosis Date   Bowel obstruction ([address])    Breast cancer ([place])    Breast cancer, stage 3 ([place]) 07/21/2011   Bronchitis    CAD (coronary artery disease)    a. cath in 06/2020 showing mild to moderate nonobstructive CAD   CHF (congestive heart failure) ([place])    a. EF previously 55-60% in 06/2018 b. EF reduced to 40-45% by repeat echo in 03/2019 and at 35-40% by echo in 05/2020   CLL (chronic lymphocytic leukemia) ([place]) 07/21/2011   Migraines    PAF (paroxysmal atrial fibrillation) ([facility])    Pneumonia     Past Surgical History:  Procedure Laterality Date   ABDOMINAL HYSTERECTOMY     APPENDECTOMY     CHOLECYSTECTOMY     LAPAROTOMY N/A 06/08/2015   Procedure: EXPLORATORY LAPAROTOMY;  Surgeon: Aviva Signs, MD;  Location: [facility];  Service: General;  Laterality: N/A;   LYSIS OF ADHESION N/A 06/08/2015   Procedure: LYSIS OF  ADHESION;  Surgeon: Aviva Signs, MD;  Location: [facility];  Service: General;  Laterality: N/A;   MASTECTOMY     right   RIGHT/LEFT HEART CATH AND CORONARY ANGIOGRAPHY N/A 07/09/2020   Procedure: RIGHT/LEFT HEART CATH AND CORONARY ANGIOGRAPHY;  Surgeon: Nelva Bush, MD;  Location: [facility];  Service: Cardiovascular;  Laterality: N/A;   TOTAL HIP ARTHROPLASTY     right    Current Medications: Outpatient Medications Prior to Visit  Medication Sig Dispense Refill   acetaminophen (TYLENOL) 500 MG tablet Take 1,000 mg by mouth every 6 (six) hours as needed for moderate pain or headache.     albuterol  (PROVENTIL HFA;VENTOLIN HFA) 108 (90 BASE) MCG/ACT inhaler Inhale 2 puffs into the lungs every 6 (six) hours as needed for shortness of breath.      bisoprolol (ZEBETA) 5 MG tablet Take 1 tablet (5 mg total) by mouth daily. 90 tablet 3   Capsaicin-Menthol (SALONPAS GEL EX) Apply topically.     diphenhydrAMINE (BENADRYL) 25 MG tablet Take 25 mg by mouth daily as needed for allergies.     ELIQUIS 5 MG TABS tablet TAKE 1 TABLET BY MOUTH 2 TIMES DAILY. 180 tablet 0   ergocalciferol (VITAMIN D2) 1.25 MG (50000 UT) capsule Take 1 capsule (50,000 Units total) by mouth every Wednesday. 12 capsule 3   omeprazole (PRILOSEC) 20 MG capsule Take 20 mg by mouth daily.     spironolactone (ALDACTONE) 25 MG tablet Take 1 tablet (25 mg total) by mouth daily. 90 tablet 3   albuterol (PROVENTIL) (2.5 MG/3ML) 0.083% nebulizer solution Take 3 mLs (2.5 mg total) by nebulization every 6 (six) hours as needed for wheezing or shortness of breath (when not relieved by inhaler.). (Patient not taking: No sig reported) 75 mL 12   atorvastatin (LIPITOR) 20 MG tablet Take 1 tablet (20 mg total) by mouth daily. (Patient not taking: Reported on 03/29/2021) 90 tablet 3   Blood Glucose Monitoring Suppl ([place]) w/Device KIT  (Patient not taking: No sig reported)     empagliflozin (JARDIANCE) 10 MG TABS tablet Take 1 tablet (10 mg total) by mouth daily before breakfast. (Patient not taking: Reported on 03/29/2021) 90 tablet 3   No facility-administered medications prior to visit.     Allergies:   Codeine, Losartan, Meclizine, Sulfa antibiotics, and Zoloft [sertraline hcl]   Social History   Socioeconomic History   Marital status: Widowed    Spouse name: Not on file   Number of children: Not on file   Years of education: 11   Highest education level: Not on file  Occupational History   Not on file  Tobacco Use   Smoking status: Former    Types: Cigarettes    Quit date: 10/26/1978    Years since quitting:  42.4   Smokeless tobacco: Never  Vaping Use   Vaping Use: Never used  Substance and Sexual Activity   Alcohol use: No   Drug use: No   Sexual activity: Not Currently  Other Topics Concern   Not on file  Social History Narrative   Pt lives by herself   Social Determinants of Health   Financial Resource Strain: Low Risk    Difficulty of Paying Living Expenses: Not hard at all  Food Insecurity: No Food Insecurity   Worried About Charity fundraiser in the Last Year: Never true   [place] in the Last Year: Never true  Transportation Needs: No Transportation Needs  Lack of Transportation (Medical): No   Lack of Transportation (Non-Medical): No  Physical Activity: Inactive   Days of Exercise per Week: 0 days   Minutes of Exercise per Session: 0 min  Stress: Not on file  Social Connections: Socially Isolated   Frequency of Communication with Friends and Family: More than three times a week   Frequency of Social Gatherings with Friends and Family: Twice a week   Attends Religious Services: Never   Marine scientist or Organizations: No   Attends Archivist Meetings: Never   Marital Status: Widowed     Family History:  The patient's family history includes Cancer in her brother and another family member.   Review of Systems:    Please see the history of present illness.     All other systems reviewed and are otherwise negative except as noted above.   Physical Exam:    VS:  BP 120/82   Pulse 62   Ht 5' 1"  (1.549 m)   Wt 170 lb (77.1 kg)   SpO2 95%   BMI 32.12 kg/m    General: Obese 85 year old woman in no acute distress. Head: Normocephalic, atraumatic. Neck: No carotid bruits. JVD not elevated.  Lungs: Clear to auscultation  heart: Regular rate and rhythm. No S3 or S4.  2/6 SEM along RUSB.  Abdomen: Appears non-distended. No obvious abdominal masses. Msk:  Strength and tone appear normal for age.  Extremities: No clubbing or cyanosis. No  pitting edema.  Distal pedal pulses are 2+ bilaterally. Neuro: Alert and oriented X 3. Moves all extremities spontaneously. No focal deficits noted. Psych:  Responds to questions appropriately with a normal affect. Skin: No rashes or lesions noted  Wt Readings from Last 3 Encounters:  03/29/21 170 lb (77.1 kg)  12/25/20 167 lb (75.8 kg)  10/21/20 166 lb (75.3 kg)     Studies/Labs Reviewed:   EKG:  EKG is not ordered today.   Recent Labs: 10/07/2020: ALT 17; BUN 26; Creatinine, Ser 0.91; Hemoglobin 12.7; Platelets 191; Potassium 4.2; Sodium 140   Lipid Panel    Component Value Date/Time   CHOL 197 01/15/2020 1312   TRIG 134 01/15/2020 1312   HDL 43 01/15/2020 1312   CHOLHDL 4.6 01/15/2020 1312   VLDL 27 01/15/2020 1312   LDLCALC 127 (H) 01/15/2020 1312    Additional studies/ records that were reviewed today include:   Cardiac Catheterization: 07/09/2020 Conclusions: Mild to moderate, non-obstructive coronary artery disease with 30-40% mid LAD stenosis and mild luminal irregularities involving the LCx and RCA. Severely reduced left ventricular systolic function (LVEF 23-76%). Upper normal left heart, right heart, and pulmonary artery pressures. Normal Fick cardiac output/index. Mild aortic stenosis.   Recommendations: Medical therapy and risk factor modification to prevent progression of coronary artery disease. Escalate goal directed medical therapy for treatment of nonischemic cardiomyopathy, as tolerated. Restart apixaban tomorrow morning if no evidence of bleeding/vascular complication.    Echocardiogram: 12/21/2020 IMPRESSIONS     1. Left ventricular ejection fraction, by estimation, is 35 to 40%. The  left ventricle has moderately decreased function. The left ventricle  demonstrates regional wall motion abnormalities (see scoring  diagram/findings for description). The left  ventricular internal cavity size was mildly dilated. Left ventricular  diastolic  parameters are consistent with Grade I diastolic dysfunction  (impaired relaxation).   2. Right ventricular systolic function is normal. The right ventricular  size is normal. Tricuspid regurgitation signal is inadequate for assessing  PA pressure.  3. Mild mitral valve regurgitation.   4. The aortic valve is tricuspid. There is moderate calcification of the  aortic valve. Aortic valve regurgitation is not visualized. Mild aortic  valve stenosis. Aortic valve mean gradient measures 10.5 mmHg.  Dimentionless index 0.46.   5. The inferior vena cava is normal in size with greater than 50%  respiratory variability, suggesting right atrial pressure of 3 mmHg.   Assessment:    No diagnosis found.    Plan:   In order of problems listed above:  1. HFrEF/NICM  - Her EF remains reduced at 35 to 40% by most recent echocardiogram with the mid and distal anterior septum and apex being akinetic. - She reports her breathing has been stable and she does not appear volume overloaded by examination today. -She is off Entresto and lisinopril.  We will try low-dose of losartan.  Follow-up in January.  Encouraged her to weigh herself daily.  Watch the salt particularly over the holidays. 2. Paroxysmal Atrial Fibrillation - She denies any recent palpitations.  Currently appears to be in sinus rhythm as her heart rate is regular..  Continue Bisoprolol 5 mg daily for rate control. Keep on current dose of Eliquis. 3. CAD - She had mild to moderate nonobstructive CAD by recent catheterization in 06/2020. She denies any recent anginal symptoms. Continue Atorvastatin 20 mg daily and Bisoprolol 5 mg daily. She is not on ASA given the need for anticoagulation  4. Aortic Stenosis -Continue to follow with periodic echoes. 5. CLL - Followed by Oncology. WBC was stable at 36.2 in 09/2020.  Will check CBC, B met, BNP, lipid when she goes in for her oncology visit.  Follow-up in January. Medication  Adjustments/Labs and Tests Ordered: Current medicines are reviewed at length with the patient today.  Concerns regarding medicines are outlined above.  Medication changes, Labs and Tests ordered today are listed in the Patient Instructions below. There are no Patient Instructions on file for this visit.   Signed, Dorris Carnes, MD  03/29/2021 2:21 PM    [facility] [address] [place], [place] [address] Phone: [phone] Fax: [phone]

## 2021-03-29 ENCOUNTER — Ambulatory Visit: Payer: Medicare HMO | Admitting: Internal Medicine

## 2021-03-29 ENCOUNTER — Encounter: Payer: Self-pay | Admitting: Internal Medicine

## 2021-03-29 ENCOUNTER — Other Ambulatory Visit: Payer: Self-pay

## 2021-03-29 VITALS — BP 120/82 | HR 62 | Ht 61.0 in | Wt 170.0 lb

## 2021-03-29 DIAGNOSIS — I480 Paroxysmal atrial fibrillation: Secondary | ICD-10-CM

## 2021-03-29 MED ORDER — LOSARTAN POTASSIUM 25 MG PO TABS: 12.5000 mg | ORAL_TABLET | Freq: Every day | ORAL | 3 refills | Status: DC

## 2021-03-29 NOTE — Patient Instructions (Signed)
Medication Instructions:  Losartan 12.5 mg Daily   *If you need a refill on your cardiac medications before your next appointment, please call your pharmacy*   Lab Work: Your physician recommends that you return for lab work and have labs done at next lab draw.   If you have labs (blood work) drawn today and your tests are completely normal, you will receive your results only by: [place] (if you have MyChart) OR A paper copy in the mail If you have any lab test that is abnormal or we need to change your treatment, we will call you to review the results.   Testing/Procedures: NONE    Follow-Up: At [facility], you and your health needs are our priority.  As part of our continuing mission to provide you with exceptional heart care, we have created designated Provider Care Teams.  These Care Teams include your primary Cardiologist (physician) and Advanced Practice Providers (APPs -  Physician Assistants and Nurse Practitioners) who all work together to provide you with the care you need, when you need it.  We recommend signing up for the patient portal called "MyChart".  Sign up information is provided on this After Visit Summary.  MyChart is used to connect with patients for Virtual Visits (Telemedicine).  Patients are able to view lab/test results, encounter notes, upcoming appointments, etc.  Non-urgent messages can be sent to your provider as well.   To learn more about what you can do with MyChart, go to [email or web address].    Your next appointment:    January   The format for your next appointment:   In Person  Provider:   Dorris Carnes, MD    Other Instructions Thank you for choosing [facility]!

## 2021-04-07 ENCOUNTER — Emergency Department (HOSPITAL_COMMUNITY): Payer: Medicare HMO

## 2021-04-07 ENCOUNTER — Other Ambulatory Visit: Payer: Self-pay

## 2021-04-07 ENCOUNTER — Encounter (HOSPITAL_COMMUNITY): Payer: Self-pay | Admitting: Emergency Medicine

## 2021-04-07 ENCOUNTER — Observation Stay (HOSPITAL_COMMUNITY)
Admission: EM | Admit: 2021-04-07 | Discharge: 2021-04-08 | Disposition: A | Discharge status: Home / Self Care | Payer: Medicare HMO | Attending: Internal Medicine | Admitting: Internal Medicine

## 2021-04-07 DIAGNOSIS — I480 Paroxysmal atrial fibrillation: Secondary | ICD-10-CM | POA: Insufficient documentation

## 2021-04-07 DIAGNOSIS — I350 Nonrheumatic aortic (valve) stenosis: Secondary | ICD-10-CM | POA: Diagnosis not present

## 2021-04-07 DIAGNOSIS — R0602 Shortness of breath: Secondary | ICD-10-CM

## 2021-04-07 DIAGNOSIS — I5043 Acute on chronic combined systolic (congestive) and diastolic (congestive) heart failure: Secondary | ICD-10-CM

## 2021-04-07 DIAGNOSIS — Z87891 Personal history of nicotine dependence: Secondary | ICD-10-CM | POA: Insufficient documentation

## 2021-04-07 DIAGNOSIS — Z20822 Contact with and (suspected) exposure to covid-19: Secondary | ICD-10-CM | POA: Diagnosis not present

## 2021-04-07 DIAGNOSIS — Z96641 Presence of right artificial hip joint: Secondary | ICD-10-CM | POA: Insufficient documentation

## 2021-04-07 DIAGNOSIS — I2510 Atherosclerotic heart disease of native coronary artery without angina pectoris: Secondary | ICD-10-CM | POA: Diagnosis not present

## 2021-04-07 DIAGNOSIS — I5023 Acute on chronic systolic (congestive) heart failure: Secondary | ICD-10-CM | POA: Insufficient documentation

## 2021-04-07 DIAGNOSIS — I5042 Chronic combined systolic (congestive) and diastolic (congestive) heart failure: Secondary | ICD-10-CM

## 2021-04-07 DIAGNOSIS — R0609 Other forms of dyspnea: Secondary | ICD-10-CM

## 2021-04-07 DIAGNOSIS — I700 Atherosclerosis of aorta: Secondary | ICD-10-CM | POA: Diagnosis not present

## 2021-04-07 DIAGNOSIS — I447 Left bundle-branch block, unspecified: Secondary | ICD-10-CM | POA: Diagnosis not present

## 2021-04-07 DIAGNOSIS — Z7901 Long term (current) use of anticoagulants: Secondary | ICD-10-CM | POA: Diagnosis not present

## 2021-04-07 DIAGNOSIS — Z743 Need for continuous supervision: Secondary | ICD-10-CM | POA: Diagnosis not present

## 2021-04-07 DIAGNOSIS — J9601 Acute respiratory failure with hypoxia: Principal | ICD-10-CM | POA: Insufficient documentation

## 2021-04-07 DIAGNOSIS — Z79899 Other long term (current) drug therapy: Secondary | ICD-10-CM | POA: Insufficient documentation

## 2021-04-07 DIAGNOSIS — Z853 Personal history of malignant neoplasm of breast: Secondary | ICD-10-CM | POA: Diagnosis not present

## 2021-04-07 DIAGNOSIS — R069 Unspecified abnormalities of breathing: Secondary | ICD-10-CM | POA: Diagnosis not present

## 2021-04-07 DIAGNOSIS — R059 Cough, unspecified: Secondary | ICD-10-CM | POA: Diagnosis not present

## 2021-04-07 DIAGNOSIS — Z8709 Personal history of other diseases of the respiratory system: Secondary | ICD-10-CM

## 2021-04-07 DIAGNOSIS — I517 Cardiomegaly: Secondary | ICD-10-CM | POA: Diagnosis not present

## 2021-04-07 LAB — CBC WITH DIFFERENTIAL/PLATELET
Basophils Absolute: 0 10*3/uL (ref 0.0–0.1)
Basophils Relative: 0 %
Eosinophils Absolute: 0 10*3/uL (ref 0.0–0.5)
Eosinophils Relative: 0 %
HCT: 41 % (ref 36.0–46.0)
Hemoglobin: 13.7 g/dL (ref 12.0–15.0)
Lymphocytes Relative: 73 %
Lymphs Abs: 22.6 10*3/uL — ABNORMAL HIGH (ref 0.7–4.0)
MCH: 31.1 pg (ref 26.0–34.0)
MCHC: 33.4 g/dL (ref 30.0–36.0)
MCV: 93.2 fL (ref 80.0–100.0)
Monocytes Absolute: 2.2 10*3/uL — ABNORMAL HIGH (ref 0.1–1.0)
Monocytes Relative: 7 %
Neutro Abs: 6.2 10*3/uL (ref 1.7–7.7)
Neutrophils Relative %: 20 %
Platelets: 210 10*3/uL (ref 150–400)
RBC: 4.4 MIL/uL (ref 3.87–5.11)
RDW: 14.2 % (ref 11.5–15.5)
WBC: 30.9 10*3/uL — ABNORMAL HIGH (ref 4.0–10.5)
nRBC: 0.1 % (ref 0.0–0.2)

## 2021-04-07 LAB — BASIC METABOLIC PANEL
Anion gap: 12 (ref 5–15)
BUN: 31 mg/dL — ABNORMAL HIGH (ref 8–23)
CO2: 22 mmol/L (ref 22–32)
Calcium: 9.3 mg/dL (ref 8.9–10.3)
Chloride: 107 mmol/L (ref 98–111)
Creatinine, Ser: 0.93 mg/dL (ref 0.44–1.00)
GFR, Estimated: >60 mL/min (ref >60)
Glucose, Bld: 129 mg/dL — ABNORMAL HIGH (ref 70–99)
Potassium: 3.9 mmol/L (ref 3.5–5.1)
Sodium: 141 mmol/L (ref 135–145)

## 2021-04-07 LAB — BRAIN NATRIURETIC PEPTIDE: B Natriuretic Peptide: 512 pg/mL — ABNORMAL HIGH (ref 0.0–100.0)

## 2021-04-07 LAB — RESP PANEL BY RT-PCR (FLU A&B, COVID) ARPGX2
Influenza A by PCR: NEGATIVE
Influenza B by PCR: NEGATIVE
SARS Coronavirus 2 by RT PCR: NEGATIVE

## 2021-04-07 LAB — TROPONIN I (HIGH SENSITIVITY)
Troponin I (High Sensitivity): 4 ng/L (ref <18)
Troponin I (High Sensitivity): 4 ng/L (ref <18)

## 2021-04-07 MED ORDER — ACETAMINOPHEN 325 MG PO TABS
650.0000 mg | ORAL_TABLET | ORAL | Status: DC | PRN
  Administered 2021-04-07 – 2021-04-08 (×3): 650 mg via ORAL
  Filled 2021-04-07 (×3): qty 2

## 2021-04-07 MED ORDER — SODIUM CHLORIDE 0.9% FLUSH: 3.0000 mL | INTRAVENOUS | Status: DC | PRN

## 2021-04-07 MED ORDER — SODIUM CHLORIDE 0.9 % IV SOLN: 250.0000 mL | INTRAVENOUS | Status: DC | PRN

## 2021-04-07 MED ORDER — BISOPROLOL FUMARATE 5 MG PO TABS
2.5000 mg | ORAL_TABLET | Freq: Every day | ORAL | Status: DC
  Administered 2021-04-07 – 2021-04-08 (×2): 2.5 mg via ORAL
  Filled 2021-04-07 (×2): qty 1

## 2021-04-07 MED ORDER — SPIRONOLACTONE 25 MG PO TABS
25.0000 mg | ORAL_TABLET | Freq: Every day | ORAL | Status: DC
  Administered 2021-04-07 – 2021-04-08 (×2): 25 mg via ORAL
  Filled 2021-04-07 (×2): qty 1

## 2021-04-07 MED ORDER — APIXABAN 5 MG PO TABS
5.0000 mg | ORAL_TABLET | Freq: Two times a day (BID) | ORAL | Status: DC
  Administered 2021-04-07 – 2021-04-08 (×2): 5 mg via ORAL
  Filled 2021-04-07 (×2): qty 1

## 2021-04-07 MED ORDER — FUROSEMIDE 10 MG/ML IJ SOLN
40.0000 mg | Freq: Once | INTRAMUSCULAR | Status: AC
  Administered 2021-04-07: 40 mg via INTRAVENOUS
  Filled 2021-04-07: qty 4

## 2021-04-07 MED ORDER — METHYLPREDNISOLONE SODIUM SUCC 125 MG IJ SOLR
125.0000 mg | Freq: Once | INTRAMUSCULAR | Status: AC
  Administered 2021-04-07: 125 mg via INTRAVENOUS
  Filled 2021-04-07: qty 2

## 2021-04-07 MED ORDER — ATORVASTATIN CALCIUM 20 MG PO TABS
20.0000 mg | ORAL_TABLET | Freq: Every day | ORAL | Status: DC
  Administered 2021-04-07 – 2021-04-08 (×2): 20 mg via ORAL
  Filled 2021-04-07 (×2): qty 1

## 2021-04-07 MED ORDER — SODIUM CHLORIDE 0.9% FLUSH
3.0000 mL | Freq: Two times a day (BID) | INTRAVENOUS | Status: DC
  Administered 2021-04-08: 10:00:00 3 mL via INTRAVENOUS

## 2021-04-07 MED ORDER — IOHEXOL 350 MG/ML SOLN
100.0000 mL | Freq: Once | INTRAVENOUS | Status: AC | PRN
  Administered 2021-04-07: 75 mL via INTRAVENOUS

## 2021-04-07 MED ORDER — PANTOPRAZOLE SODIUM 40 MG PO TBEC
40.0000 mg | DELAYED_RELEASE_TABLET | Freq: Every day | ORAL | Status: DC
  Administered 2021-04-07 – 2021-04-08 (×2): 40 mg via ORAL
  Filled 2021-04-07 (×2): qty 1

## 2021-04-07 MED ORDER — ONDANSETRON HCL 4 MG/2ML IJ SOLN: 4.0000 mg | Freq: Four times a day (QID) | INTRAMUSCULAR | Status: DC | PRN

## 2021-04-07 NOTE — Consult Note (Signed)
Cardiology Consultation:   Patient ID: [name]; [id number]; 1936-04-26   Admit date: 04/07/2021 Date of Consult: 04/07/2021  Primary Care Provider: Lemmie Evens, MD Primary Cardiologist: Dorris Carnes, MD Primary Electrophysiologist: None   Patient Profile:   [name] is an 85 y.o. female with a history of paroxysmal atrial fibrillation, CLL, mild to moderate nonobstructive CAD by cardiac catheterization in February of this year, and HFrEF who is being seen today for the evaluation of shortness of breath at the request of Dr. Carles Collet.  History of Present Illness:   [name] presents to the [facility] stating that she suddenly noticed that she became short of breath yesterday afternoon at 4 PM, no obvious precipitant described.  She felt short of breath with ADLs about the house, no sense of palpitations however or associated chest tightness.  No cough.  She states that her weight has been stable at home and that she has not had any leg swelling.  She just recently saw Dr. Harrington Challenger on November 7, I reviewed the note.  She was clinically stable at that time with plan to try low-dose losartan given prior intolerance to both Entresto and lisinopril.  Reportedly heart rate was regular during that visit at 62 bpm.  She has been on bisoprolol and Eliquis with known history of PAF.  Last echocardiogram was in August revealing LVEF 35 to 40%, normal RV contraction, mild calcific aortic stenosis with mean gradient approximate 11 mmHg.  We discussed her medications, she stated that she never started the losartan given concerns that it may cause headache which is what happened with lisinopril.  Otherwise has been taking her medications regularly.  Past Medical History:  Diagnosis Date   Bowel obstruction ([place])    Breast cancer ([place])    Breast cancer, stage 3 ([place]) 07/21/2011   Bronchitis    CAD (coronary artery disease)    a. cath in 06/2020 showing mild to moderate  nonobstructive CAD   CHF (congestive heart failure) ([place])    a. EF previously 55-60% in 06/2018 b. EF reduced to 40-45% by repeat echo in 03/2019 and at 35-40% by echo in 05/2020   CLL (chronic lymphocytic leukemia) ([place]) 07/21/2011   Migraines    PAF (paroxysmal atrial fibrillation) (HCC)    Pneumonia     Past Surgical History:  Procedure Laterality Date   ABDOMINAL HYSTERECTOMY     APPENDECTOMY     CHOLECYSTECTOMY     LAPAROTOMY N/A 06/08/2015   Procedure: EXPLORATORY LAPAROTOMY;  Surgeon: Aviva Signs, MD;  Location: [facility];  Service: General;  Laterality: N/A;   LYSIS OF ADHESION N/A 06/08/2015   Procedure: LYSIS OF ADHESION;  Surgeon: Aviva Signs, MD;  Location: [facility];  Service: General;  Laterality: N/A;   MASTECTOMY     right   RIGHT/LEFT HEART CATH AND CORONARY ANGIOGRAPHY N/A 07/09/2020   Procedure: RIGHT/LEFT HEART CATH AND CORONARY ANGIOGRAPHY;  Surgeon: Nelva Bush, MD;  Location: [facility];  Service: Cardiovascular;  Laterality: N/A;   TOTAL HIP ARTHROPLASTY     right     Outpatient Medications: No current facility-administered medications on file prior to encounter.   Current Outpatient Medications on File Prior to Encounter  Medication Sig Dispense Refill   acetaminophen (TYLENOL) 500 MG tablet Take 1,000 mg by mouth every 6 (six) hours as needed for moderate pain or headache.     albuterol (PROVENTIL HFA;VENTOLIN HFA) 108 (90 BASE) MCG/ACT inhaler Inhale 2 puffs into the  lungs every 6 (six) hours as needed for shortness of breath.      albuterol (PROVENTIL) (2.5 MG/3ML) 0.083% nebulizer solution Take 3 mLs (2.5 mg total) by nebulization every 6 (six) hours as needed for wheezing or shortness of breath (when not relieved by inhaler.). (Patient not taking: No sig reported) 75 mL 12   atorvastatin (LIPITOR) 20 MG tablet Take 1 tablet (20 mg total) by mouth daily. (Patient not taking: Reported on 03/29/2021) 90 tablet 3   bisoprolol (ZEBETA) 5 MG tablet Take  1 tablet (5 mg total) by mouth daily. 90 tablet 3   Blood Glucose Monitoring Suppl ([place]) w/Device KIT  (Patient not taking: No sig reported)     Capsaicin-Menthol (SALONPAS GEL EX) Apply topically.     diphenhydrAMINE (BENADRYL) 25 MG tablet Take 25 mg by mouth daily as needed for allergies.     ELIQUIS 5 MG TABS tablet TAKE 1 TABLET BY MOUTH 2 TIMES DAILY. 180 tablet 0   ergocalciferol (VITAMIN D2) 1.25 MG (50000 UT) capsule Take 1 capsule (50,000 Units total) by mouth every Wednesday. 12 capsule 3   losartan (COZAAR) 25 MG tablet Take 0.5 tablets (12.5 mg total) by mouth daily. 45 tablet 3   omeprazole (PRILOSEC) 20 MG capsule Take 20 mg by mouth daily.     spironolactone (ALDACTONE) 25 MG tablet Take 1 tablet (25 mg total) by mouth daily. 90 tablet 3     Allergies:    Allergies  Allergen Reactions   Codeine Nausea And Vomiting    headache   Losartan Nausea Only    Nauseated and headaches   Meclizine Other (See Comments)    Made dizziness worse.   Sulfa Antibiotics Nausea And Vomiting    Stomach upset   Zoloft [Sertraline Hcl] Anxiety    Social History:   Social History   Tobacco Use   Smoking status: Former    Types: Cigarettes    Quit date: 10/26/1978    Years since quitting: 42.4   Smokeless tobacco: Never  Substance Use Topics   Alcohol use: No     Family History:   The patient's family history includes Cancer in her brother and another family member.  ROS:  Please see the history of present illness.  Chronic back pain.  All other ROS reviewed and negative.     Physical Exam/Data:   Vitals:   04/07/21 1130 04/07/21 1230 04/07/21 1330 04/07/21 1400  BP: 107/74 (!) 110/94 127/85 113/89  Pulse: 88 61 78   Resp: 18 15 18 19   Temp:      TempSrc:      SpO2: 100% 97% 90% 96%  Weight:      Height:       No intake or output data in the 24 hours ending 04/07/21 1542 Filed Weights   04/07/21 1011  Weight: 77.1 kg   Body mass index is  32.12 kg/m.   Gen: Patient appears comfortable at rest. HEENT: Conjunctiva and lids normal, oropharynx clear. Neck: Supple, no elevated JVP or carotid bruits, no thyromegaly. Lungs: Clear to auscultation, nonlabored breathing at rest. Cardiac: Regular rate and rhythm, no S3, 2/6 systolic murmur, no pericardial rub. Abdomen: Soft, nontender, bowel sounds present. Extremities: No pitting edema, distal pulses 2+. Skin: Warm and dry. Musculoskeletal: Mild kyphosis. Neuropsychiatric: Alert and oriented x3, affect grossly appropriate.  EKG:  An ECG dated 04/07/2021 was personally reviewed today and demonstrated:  Atrial fibrillation with left bundle branch block.  Telemetry:  I personally reviewed telemetry which shows paroxysmal atrial fibrillation with mild RVR.  Relevant CV Studies:  Echocardiogram 12/21/2020:  1. Left ventricular ejection fraction, by estimation, is 35 to 40%. The  left ventricle has moderately decreased function. The left ventricle  demonstrates regional wall motion abnormalities (see scoring  diagram/findings for description). The left  ventricular internal cavity size was mildly dilated. Left ventricular  diastolic parameters are consistent with Grade I diastolic dysfunction  (impaired relaxation).   2. Right ventricular systolic function is normal. The right ventricular  size is normal. Tricuspid regurgitation signal is inadequate for assessing  PA pressure.   3. Mild mitral valve regurgitation.   4. The aortic valve is tricuspid. There is moderate calcification of the  aortic valve. Aortic valve regurgitation is not visualized. Mild aortic  valve stenosis. Aortic valve mean gradient measures 10.5 mmHg.  Dimentionless index 0.46.   5. The inferior vena cava is normal in size with greater than 50%  respiratory variability, suggesting right atrial pressure of 3 mmHg.   Laboratory Data:  Chemistry Recent Labs  Lab 04/07/21 1118  NA 141  K 3.9  CL 107  CO2  22  GLUCOSE 129*  BUN 31*  CREATININE 0.93  CALCIUM 9.3  GFRNONAA >60  ANIONGAP 12    No results for input(s): PROT, ALBUMIN, AST, ALT, ALKPHOS, BILITOT in the last 168 hours. Hematology Recent Labs  Lab 04/07/21 1118  WBC 30.9*  RBC 4.40  HGB 13.7  HCT 41.0  MCV 93.2  MCH 31.1  MCHC 33.4  RDW 14.2  PLT 210   Cardiac Enzymes Recent Labs  Lab 04/07/21 1118 04/07/21 1312  TROPONINIHS 4 4   BNP Recent Labs  Lab 04/07/21 1118  BNP 512.0*     Radiology/Studies:  DG Chest Port 1 View  Result Date: 04/07/2021 CLINICAL DATA:  Shortness of breath with exertion.  Cough. EXAM: PORTABLE CHEST 1 VIEW COMPARISON:  03/25/2019 FINDINGS: There is cardiomegaly and aortic atherosclerosis. The upper lungs are clear. Minimal atelectasis or scarring at the lung bases. No visible effusion. No acute bone finding. IMPRESSION: Cardiomegaly and aortic atherosclerosis. Minimal atelectasis or scarring at the lung bases. Electronically Signed   By: Nelson Chimes M.D.   On: 04/07/2021 11:15    Assessment and Plan:   1.  Recent onset shortness of breath.  BNP elevated at 512, but she has had no weight gain or leg swelling.  High-sensitivity troponin I levels are normal arguing against ACS.  She has had paroxysmal atrial fibrillation with RVR noted while in the ER, including on my examination.  Could be rhythm related more so than fluid related.  Chest x-ray reports minimal atelectasis versus scarring in the lung bases.  Chest CTA pending per primary team.  2.  Paroxysmal atrial fibrillation with CHA2DS2-VASc score of 5.  She is on Eliquis for stroke prophylaxis, also bisoprolol as part of her regimen for cardiomyopathy.  Does not report any sense of palpitations, also not entirely clear how frequently she has arrhythmia and whether this could be contributing to her symptoms.  3.  Mild to moderate coronary atherosclerosis, nonobstructive at cardiac catheterization in February of this year.  She is on  Lipitor.  4.  HFrEF, LVEF 35 to 40% as of August.  She has had intolerances to both Entresto and lisinopril, just recently to start losartan although had not yet begun.  Otherwise on bisoprolol, Aldactone, no other standing diuretic.  5.  Mild calcific aortic stenosis, unlikely to be  contributing to her current symptoms.  Patient being admitted to the hospitalist service, to be given dose of IV Lasix in the ER and reevaluate tomorrow in terms of symptoms and fluid status.  Does not appear significantly volume overloaded.  Would follow telemetry to see how frequently she is having atrial fibrillation.  It may be that we consider outpatient oral amiodarone load for rhythm suppression (TSH and LFTs normal per review of lab work).  Also would eventually start losartan 12.5 mg daily to round out her cardiomyopathy regimen.  Follow-up echocardiogram ordered and pending.  Signed, Rozann Lesches, MD  04/07/2021 3:42 PM

## 2021-04-07 NOTE — ED Notes (Signed)
attempted to give report x1.

## 2021-04-07 NOTE — ED Triage Notes (Signed)
Pt arrived by [facility] for SOB, Cough. Pt has hx of COPd. Pt took @ home breathing tx last night w/ relief.

## 2021-04-07 NOTE — ED Provider Notes (Signed)
[facility] EMERGENCY DEPARTMENT Provider Note  CSN: [id number] Arrival date & time: 04/07/21 1002    History Chief Complaint  Patient presents with   Cough   Shortness of Breath    [name] is a 85 y.o. female with history of COPD, afib on Eliquis reports several days of increasing dyspnea with exertion, improved with rest. No chest pain. She has a chronic dry cough which is maybe a little worse than usual. No fevers. No bloody or melanic stools. She was placed on oxygen by EMS but no reported hypoxia.    Past Medical History:  Diagnosis Date   Bowel obstruction ([place])    Breast cancer ([place])    Breast cancer, stage 3 ([place]) 07/21/2011   Bronchitis    CAD (coronary artery disease)    a. cath in 06/2020 showing mild to moderate nonobstructive CAD   CHF (congestive heart failure) ([place])    a. EF previously 55-60% in 06/2018 b. EF reduced to 40-45% by repeat echo in 03/2019 and at 35-40% by echo in 05/2020   CLL (chronic lymphocytic leukemia) ([place]) 07/21/2011   Migraines    PAF (paroxysmal atrial fibrillation) (HCC)    Pneumonia     Past Surgical History:  Procedure Laterality Date   ABDOMINAL HYSTERECTOMY     APPENDECTOMY     CHOLECYSTECTOMY     LAPAROTOMY N/A 06/08/2015   Procedure: EXPLORATORY LAPAROTOMY;  Surgeon: Aviva Signs, MD;  Location: [facility];  Service: General;  Laterality: N/A;   LYSIS OF ADHESION N/A 06/08/2015   Procedure: LYSIS OF ADHESION;  Surgeon: Aviva Signs, MD;  Location: [facility];  Service: General;  Laterality: N/A;   MASTECTOMY     right   RIGHT/LEFT HEART CATH AND CORONARY ANGIOGRAPHY N/A 07/09/2020   Procedure: RIGHT/LEFT HEART CATH AND CORONARY ANGIOGRAPHY;  Surgeon: Nelva Bush, MD;  Location: [facility];  Service: Cardiovascular;  Laterality: N/A;   TOTAL HIP ARTHROPLASTY     right    Family History  Problem Relation Age of Onset   Cancer Brother    Cancer Other     Social History   Tobacco Use   Smoking status:  Former    Types: Cigarettes    Quit date: 10/26/1978    Years since quitting: 42.4   Smokeless tobacco: Never  Vaping Use   Vaping Use: Never used  Substance Use Topics   Alcohol use: No   Drug use: No     Home Medications Prior to Admission medications   Medication Sig Start Date End Date Taking? Authorizing Provider  acetaminophen (TYLENOL) 500 MG tablet Take 1,000 mg by mouth every 6 (six) hours as needed for moderate pain or headache.    [provider]  albuterol (PROVENTIL HFA;VENTOLIN HFA) 108 (90 BASE) MCG/ACT inhaler Inhale 2 puffs into the lungs every 6 (six) hours as needed for shortness of breath.     [provider]  albuterol (PROVENTIL) (2.5 MG/3ML) 0.083% nebulizer solution Take 3 mLs (2.5 mg total) by nebulization every 6 (six) hours as needed for wheezing or shortness of breath (when not relieved by inhaler.). Patient not taking: No sig reported 06/27/18   Barton Dubois, MD  atorvastatin (LIPITOR) 20 MG tablet Take 1 tablet (20 mg total) by mouth daily. Patient not taking: Reported on 03/29/2021 07/27/20 03/29/21  Barrett, Evelene Croon, PA-C  bisoprolol (ZEBETA) 5 MG tablet Take 1 tablet (5 mg total) by mouth daily. 10/21/20   Erma Heritage, PA-C  Blood Glucose Monitoring Suppl ([place]) w/Device KIT  01/15/20   [provider]  Capsaicin-Menthol (SALONPAS GEL EX) Apply topically.    [provider]  diphenhydrAMINE (BENADRYL) 25 MG tablet Take 25 mg by mouth daily as needed for allergies.    [provider]  ELIQUIS 5 MG TABS tablet TAKE 1 TABLET BY MOUTH 2 TIMES DAILY. 03/19/21   Fay Records, MD  ergocalciferol (VITAMIN D2) 1.25 MG (50000 UT) capsule Take 1 capsule (50,000 Units total) by mouth every Wednesday. 10/14/20   Derek Jack, MD  losartan (COZAAR) 25 MG tablet Take 0.5 tablets (12.5 mg total) by mouth daily. 03/29/21 06/27/21  Fay Records, MD  omeprazole (PRILOSEC) 20 MG capsule Take 20 mg by  mouth daily.    [provider]  spironolactone (ALDACTONE) 25 MG tablet Take 1 tablet (25 mg total) by mouth daily. 10/21/20   Strader, Fransisco Hertz, PA-C     Allergies    Codeine, Losartan, Meclizine, Sulfa antibiotics, and Zoloft [sertraline hcl]   Review of Systems   Review of Systems A comprehensive review of systems was completed and negative except as noted in HPI.    Physical Exam BP (!) 110/94   Pulse 61   Temp 98 F (36.7 C) (Oral)   Resp 15   Ht $R'5\' 1"'Nn$  (1.549 m)   Wt 77.1 kg   SpO2 97%   BMI 32.12 kg/m   Physical Exam Vitals and nursing note reviewed.  Constitutional:      Appearance: Normal appearance.  HENT:     Head: Normocephalic and atraumatic.     Nose: Nose normal.     Mouth/Throat:     Mouth: Mucous membranes are moist.  Eyes:     Extraocular Movements: Extraocular movements intact.     Conjunctiva/sclera: Conjunctivae normal.  Cardiovascular:     Rate and Rhythm: Normal rate.  Pulmonary:     Effort: Pulmonary effort is normal.     Breath sounds: Rhonchi present. No wheezing.  Abdominal:     General: Abdomen is flat.     Palpations: Abdomen is soft.     Tenderness: There is no abdominal tenderness.  Musculoskeletal:        General: No swelling. Normal range of motion.     Cervical back: Neck supple.     Right lower leg: No edema.     Left lower leg: No edema.  Skin:    General: Skin is warm and dry.  Neurological:     General: No focal deficit present.     Mental Status: She is alert.  Psychiatric:        Mood and Affect: Mood normal.     ED Results / Procedures / Treatments   Labs (all labs ordered are listed, but only abnormal results are displayed) Labs Reviewed  BASIC METABOLIC PANEL - Abnormal; Notable for the following components:      Result Value   Glucose, Bld 129 (*)    BUN 31 (*)    All other components within normal limits  BRAIN NATRIURETIC PEPTIDE - Abnormal; Notable for the following components:   B  Natriuretic Peptide 512.0 (*)    All other components within normal limits  CBC WITH DIFFERENTIAL/PLATELET - Abnormal; Notable for the following components:   WBC 30.9 (*)    Lymphs Abs 22.6 (*)    Monocytes Absolute 2.2 (*)    All other components within normal limits  RESP PANEL BY RT-PCR (FLU  A&B, COVID) ARPGX2  TROPONIN I (HIGH SENSITIVITY)  TROPONIN I (HIGH SENSITIVITY)    EKG EKG Interpretation  Date/Time:  Wednesday April 07 2021 10:14:41 EST Ventricular Rate:  82 PR Interval:    QRS Duration: 135 QT Interval:  397 QTC Calculation: 464 R Axis:   0 Text Interpretation: Atrial fibrillation LVH with secondary repolarization abnormality Anterior infarct, old Minimal ST elevation, inferior leads Baseline wander in lead(s) II aVR aVF Since last tracing Rate faster Non-specific ST-t changes Confirmed by Calvert Cantor [phone]) on 04/07/2021 10:18:05 AM  Radiology DG Chest Port 1 View  Result Date: 04/07/2021 CLINICAL DATA:  Shortness of breath with exertion.  Cough. EXAM: PORTABLE CHEST 1 VIEW COMPARISON:  03/25/2019 FINDINGS: There is cardiomegaly and aortic atherosclerosis. The upper lungs are clear. Minimal atelectasis or scarring at the lung bases. No visible effusion. No acute bone finding. IMPRESSION: Cardiomegaly and aortic atherosclerosis. Minimal atelectasis or scarring at the lung bases. Electronically Signed   By: Nelson Chimes M.D.   On: 04/07/2021 11:15    Procedures Procedures  Medications Ordered in the ED Medications  methylPREDNISolone sodium succinate (SOLU-MEDROL) 125 mg/2 mL injection 125 mg (has no administration in time range)     MDM Rules/Calculators/A&P MDM Patient with DOE in setting of afib, COPD. She does not have any wheezing today. No hypoxia on room air, O2 discontinued on arrival. Will check labs, including BNP, Trop. Also consider symptomatic anemia.   ED Course  I have reviewed the triage vital signs and the nursing notes.  Pertinent  labs & imaging results that were available during my care of the patient were reviewed by me and considered in my medical decision making (see chart for details).  Clinical Course as of 04/07/21 1408  Wed Apr 07, 2021  1153 Patient is not anemic, WBC is elevated consistent with history of CLL.  [CS]  0233 CXR with cardiomegaly no acute process.  [CS]  1200 BNP is moderately elevated but improved from priors >1 year ago.  [CS]  1213 BMP and Trop are neg.  [CS]  4356 Covid/Flu are neg.  [CS]  8616 Patient remains comfortable appearing on room air. She reports one coughing spell that made her SOB but improved with sitting up. Will check ambulatory pulse ox.  [CS]  8372 Per RN, patient dropped SpO2 to 90% and became more SOB with walking a short distance. She is not having any chest pains but given unclear etiology will give solumedrol for possible COPD exacerbation and check CTA for occult PNA or PE.  [CS]  1406 Spoke with Dr. Carles Collet, Hospitalist, who will evaluate the patient.  [CS]    Clinical Course User Index [CS] Truddie Hidden, MD    Final Clinical Impression(s) / ED Diagnoses Final diagnoses:  Dyspnea on exertion  History of COPD    Rx / DC Orders ED Discharge Orders     None        Truddie Hidden, MD 04/07/21 1408

## 2021-04-07 NOTE — H&P (Signed)
History and Physical  [name] JZP:[id number] DOB: 1935-07-23 DOA: 04/07/2021   PCP: Lemmie Evens, MD   Patient coming from: Home  Chief Complaint: dyspnea  HPI:  [name] is a 85 y.o. female with medical history of paroxysmal atrial fibrillation, CLL, HFrEF, coronary disease, LBBB right-sided breast cancer presenting with shortness of breath that began in the afternoon on 04/06/2021.  However, the patient states that she has been having orthopnea type symptoms that have been worsening over the past week.  She has been using her nebulizer and inhalers at home with some relief, but her shortness of breath and dyspnea on exertion would return.  Does not any fevers, chills, headache, neck pain, hemoptysis, nausea, vomiting, diarrhea.  She has a chronic nonproductive cough.  The patient quit smoking 40 years ago.  She only smoked 1 cigarette every 3 to 4 days.  She denies any worsening lower extremity edema or radiating abdominal girth.  She denies any diarrhea, hematochezia, melena, dysuria, hematuria.  She has not been started on any new medications recently.  ED In the emergency department, patient was afebrile and hemodynamically stable with oxygen saturation 95-96% on room air.  However, when the patient was ambulated her oxygen saturation decreases to 90%.  WBC 30.9, hemoglobin 13.7, platelets 210,000.  MP showed a sodium 141, sodium 3.9, serum creatinine 0.93.  BNP 512.  Troponin 4>> 4.  Chest x-ray showed bibasilar opacities, left greater than right.  Assessment/Plan: Acute respiratory failure with hypoxia/dyspnea -With tachypnea in the upper 20s with saturation 90% with ambulation -Suspect cardiac etiology with a degree of fluid overload in the setting of atrial fibrillation--during my eval pt had brief episodes RVR 480-165 -She may certainly be having RVR episodes -CTA chest  Acute on chronic systolic and diastolic CHF -09/22/72 Echo--EF 35-40%+WMA, G1DD, mild  MR, mild AS -lasix IV x 1 and reassess in am  CLL (chronic lymphocytic leukemia) -WBC is a stable-continue patient follow-up with oncology service.   PAF (paroxysmal atrial fibrillation) -Continue Eliquis for secondary prevention -continue  bisoprolol   LBBB -chronic  Hyperlipidemia -continue statin  CAD -she appears to have atypical chest pain -06/220 cath--mild to mod nonobstructive CAD -troponin 4>>4           Past Medical History:  Diagnosis Date   Bowel obstruction (HCC)    Breast cancer (HCC)    Breast cancer, stage 3 ([place]) 07/21/2011   Bronchitis    CAD (coronary artery disease)    a. cath in 06/2020 showing mild to moderate nonobstructive CAD   CHF (congestive heart failure) ([place])    a. EF previously 55-60% in 06/2018 b. EF reduced to 40-45% by repeat echo in 03/2019 and at 35-40% by echo in 05/2020   CLL (chronic lymphocytic leukemia) ([place]) 07/21/2011   Migraines    PAF (paroxysmal atrial fibrillation) (HCC)    Pneumonia    Past Surgical History:  Procedure Laterality Date   ABDOMINAL HYSTERECTOMY     APPENDECTOMY     CHOLECYSTECTOMY     LAPAROTOMY N/A 06/08/2015   Procedure: EXPLORATORY LAPAROTOMY;  Surgeon: Aviva Signs, MD;  Location: [facility];  Service: General;  Laterality: N/A;   LYSIS OF ADHESION N/A 06/08/2015   Procedure: LYSIS OF ADHESION;  Surgeon: Aviva Signs, MD;  Location: [facility];  Service: General;  Laterality: N/A;   MASTECTOMY     right   RIGHT/LEFT HEART CATH AND CORONARY ANGIOGRAPHY N/A 07/09/2020   Procedure: RIGHT/LEFT HEART CATH  AND CORONARY ANGIOGRAPHY;  Surgeon: Nelva Bush, MD;  Location: [facility];  Service: Cardiovascular;  Laterality: N/A;   TOTAL HIP ARTHROPLASTY     right   Social History:  reports that she quit smoking about 42 years ago. Her smoking use included cigarettes. She has never used smokeless tobacco. She reports that she does not drink alcohol and does not use drugs.   Family History  Problem  Relation Age of Onset   Cancer Brother    Cancer Other      Allergies  Allergen Reactions   Codeine Nausea And Vomiting    headache   Losartan Nausea Only    Nauseated and headaches   Meclizine Other (See Comments)    Made dizziness worse.   Sulfa Antibiotics Nausea And Vomiting    Stomach upset   Zoloft [Sertraline Hcl] Anxiety     Prior to Admission medications   Medication Sig Start Date End Date Taking? Authorizing Provider  acetaminophen (TYLENOL) 500 MG tablet Take 1,000 mg by mouth every 6 (six) hours as needed for moderate pain or headache.    [provider]  albuterol (PROVENTIL HFA;VENTOLIN HFA) 108 (90 BASE) MCG/ACT inhaler Inhale 2 puffs into the lungs every 6 (six) hours as needed for shortness of breath.     [provider]  albuterol (PROVENTIL) (2.5 MG/3ML) 0.083% nebulizer solution Take 3 mLs (2.5 mg total) by nebulization every 6 (six) hours as needed for wheezing or shortness of breath (when not relieved by inhaler.). Patient not taking: No sig reported 06/27/18   Barton Dubois, MD  atorvastatin (LIPITOR) 20 MG tablet Take 1 tablet (20 mg total) by mouth daily. Patient not taking: Reported on 03/29/2021 07/27/20 03/29/21  Barrett, Evelene Croon, PA-C  bisoprolol (ZEBETA) 5 MG tablet Take 1 tablet (5 mg total) by mouth daily. 10/21/20   Strader, Fransisco Hertz, PA-C  Blood Glucose Monitoring Suppl ([place]) w/Device KIT  01/15/20   [provider]  Capsaicin-Menthol (SALONPAS GEL EX) Apply topically.    [provider]  diphenhydrAMINE (BENADRYL) 25 MG tablet Take 25 mg by mouth daily as needed for allergies.    [provider]  ELIQUIS 5 MG TABS tablet TAKE 1 TABLET BY MOUTH 2 TIMES DAILY. 03/19/21   Fay Records, MD  ergocalciferol (VITAMIN D2) 1.25 MG (50000 UT) capsule Take 1 capsule (50,000 Units total) by mouth every Wednesday. 10/14/20   Derek Jack, MD  losartan (COZAAR) 25 MG tablet Take 0.5 tablets  (12.5 mg total) by mouth daily. 03/29/21 06/27/21  Fay Records, MD  omeprazole (PRILOSEC) 20 MG capsule Take 20 mg by mouth daily.    [provider]  spironolactone (ALDACTONE) 25 MG tablet Take 1 tablet (25 mg total) by mouth daily. 10/21/20   Erma Heritage, PA-C    Review of Systems:  Constitutional:  No weight loss, night sweats, Fevers, chills,  Head&Eyes: No headache.  No vision loss.  No eye pain or scotoma ENT:  No Difficulty swallowing,Tooth/dental problems,Sore throat,   Cardio-vascular:  No chest pain, PND, swelling in lower extremities,  dizziness, palpitations  GI:  No  abdominal pain, nausea, vomiting, diarrhea, loss of appetite, hematochezia, melena, heartburn, indigestion, Resp:  No coughing up of blood .No wheezing.No chest wall deformity  Skin:  no rash or lesions.  GU:  no dysuria, change in color of urine, no urgency or frequency. No flank pain.  Musculoskeletal:  No joint pain or swelling. No decreased  range of motion. No back pain.  Psych:  No change in mood or affect. No depression or anxiety. Neurologic: No headache, no dysesthesia, no focal weakness, no vision loss. No syncope  Physical Exam: Vitals:   04/07/21 1125 04/07/21 1130 04/07/21 1230 04/07/21 1330  BP: 95/70 107/74 (!) 110/94 127/85  Pulse: 90 88 61 78  Resp: 14 18 15 18   Temp:      TempSrc:      SpO2: 96% 100% 97% 90%  Weight:      Height:       General:  A&O x 3, NAD, nontoxic, pleasant/cooperative Head/Eye: No conjunctival hemorrhage, no icterus, /AT, No nystagmus ENT:  No icterus,  No thrush, good dentition, no pharyngeal exudate Neck:  No masses, no lymphadenpathy, no bruits CV:  RRR, no rub, no gallop, no S3 Lung:  fine bibasilar crackles. No wheeze Abdomen: soft/NT, +BS, nondistended, no peritoneal signs Ext: No cyanosis, No rashes, No petechiae, No lymphangitis, No edema Neuro: CNII-XII intact, strength 4/5 in bilateral upper and lower extremities, no  dysmetria  Labs on Admission:  Basic Metabolic Panel: Recent Labs  Lab 04/07/21 1118  NA 141  K 3.9  CL 107  CO2 22  GLUCOSE 129*  BUN 31*  CREATININE 0.93  CALCIUM 9.3   Liver Function Tests: No results for input(s): AST, ALT, ALKPHOS, BILITOT, PROT, ALBUMIN in the last 168 hours. No results for input(s): LIPASE, AMYLASE in the last 168 hours. No results for input(s): AMMONIA in the last 168 hours. CBC: Recent Labs  Lab 04/07/21 1118  WBC 30.9*  NEUTROABS 6.2  HGB 13.7  HCT 41.0  MCV 93.2  PLT 210   Coagulation Profile: No results for input(s): INR, PROTIME in the last 168 hours. Cardiac Enzymes: No results for input(s): CKTOTAL, CKMB, CKMBINDEX, TROPONINI in the last 168 hours. BNP: Invalid input(s): POCBNP CBG: No results for input(s): GLUCAP in the last 168 hours. Urine analysis:    Component Value Date/Time   COLORURINE YELLOW 06/05/2015 1303   APPEARANCEUR CLEAR 06/05/2015 1303   LABSPEC >1.030 (H) 06/05/2015 1303   PHURINE 5.5 06/05/2015 1303   GLUCOSEU NEGATIVE 06/05/2015 1303   HGBUR MODERATE (A) 06/05/2015 1303   BILIRUBINUR NEGATIVE 06/05/2015 1303   KETONESUR TRACE (A) 06/05/2015 1303   PROTEINUR TRACE (A) 06/05/2015 1303   UROBILINOGEN 0.2 10/17/2008 1128   NITRITE NEGATIVE 06/05/2015 1303   LEUKOCYTESUR NEGATIVE 06/05/2015 1303   Sepsis Labs: @LABRCNTIP (procalcitonin:4,lacticidven:4) ) Recent Results (from the past 240 hour(s))  Resp Panel by RT-PCR (Flu A&B, Covid) Nasopharyngeal Swab     Status: None   Collection Time: 04/07/21 11:00 AM   Specimen: Nasopharyngeal Swab; Nasopharyngeal(NP) swabs in vial transport medium  Result Value Ref Range Status   SARS Coronavirus 2 by RT PCR NEGATIVE NEGATIVE Final    Comment: (NOTE) SARS-CoV-2 target nucleic acids are NOT DETECTED.  The SARS-CoV-2 RNA is generally detectable in upper respiratory specimens during the acute phase of infection. The lowest concentration of SARS-CoV-2 viral copies  this assay can detect is 138 copies/mL. A negative result does not preclude SARS-Cov-2 infection and should not be used as the sole basis for treatment or other patient management decisions. A negative result may occur with  improper specimen collection/handling, submission of specimen other than nasopharyngeal swab, presence of viral mutation(s) within the areas targeted by this assay, and inadequate number of viral copies(<138 copies/mL). A negative result must be combined with clinical observations, patient history, and epidemiological information. The expected result is  Negative.  Fact Sheet for Patients:  EntrepreneurPulse.com.au  Fact Sheet for Healthcare Providers:  IncredibleEmployment.be  This test is no t yet approved or cleared by the [place] FDA and  has been authorized for detection and/or diagnosis of SARS-CoV-2 by FDA under an Emergency Use Authorization (EUA). This EUA will remain  in effect (meaning this test can be used) for the duration of the COVID-19 declaration under Section 564(b)(1) of the Act, 21 U.S.C.section 360bbb-3(b)(1), unless the authorization is terminated  or revoked sooner.       Influenza A by PCR NEGATIVE NEGATIVE Final   Influenza B by PCR NEGATIVE NEGATIVE Final    Comment: (NOTE) The Xpert Xpress SARS-CoV-2/FLU/RSV plus assay is intended as an aid in the diagnosis of influenza from Nasopharyngeal swab specimens and should not be used as a sole basis for treatment. Nasal washings and aspirates are unacceptable for Xpert Xpress SARS-CoV-2/FLU/RSV testing.  Fact Sheet for Patients: EntrepreneurPulse.com.au  Fact Sheet for Healthcare Providers: IncredibleEmployment.be  This test is not yet approved or cleared by the [place] FDA and has been authorized for detection and/or diagnosis of SARS-CoV-2 by FDA under an Emergency Use Authorization (EUA). This EUA  will remain in effect (meaning this test can be used) for the duration of the COVID-19 declaration under Section 564(b)(1) of the Act, 21 U.S.C. section 360bbb-3(b)(1), unless the authorization is terminated or revoked.  Performed at [facility], [address], [place], [place] [address]      Radiological Exams on Admission: DG Chest [facility] 1 View  Result Date: 04/07/2021 CLINICAL DATA:  Shortness of breath with exertion.  Cough. EXAM: PORTABLE CHEST 1 VIEW COMPARISON:  03/25/2019 FINDINGS: There is cardiomegaly and aortic atherosclerosis. The upper lungs are clear. Minimal atelectasis or scarring at the lung bases. No visible effusion. No acute bone finding. IMPRESSION: Cardiomegaly and aortic atherosclerosis. Minimal atelectasis or scarring at the lung bases. Electronically Signed   By: Nelson Chimes M.D.   On: 04/07/2021 11:15    EKG: Independently reviewed. Atrial fib, LBBB    Time spent:60 minutes Code Status:   FULL Family Communication:  No Family at bedside Disposition Plan: expect 1-2 day hospitalization Consults called: cardiology DVT Prophylaxis: [place] Lovenox  Orson Eva, DO  [facility] Pager [phone]  If 7PM-7AM, please contact night-coverage [email or web address] Password [facility] 04/07/2021, 2:33 PM

## 2021-04-07 NOTE — ED Notes (Signed)
PATIENT AMBULATED IN HALLWAY.  PATIENT'S O2 SAT DROPPED TO 90%.  PATIENT STATED SHE FELT WEAK FROM EXERTION.

## 2021-04-08 ENCOUNTER — Observation Stay (HOSPITAL_BASED_OUTPATIENT_CLINIC_OR_DEPARTMENT_OTHER): Payer: Medicare HMO

## 2021-04-08 DIAGNOSIS — R0609 Other forms of dyspnea: Secondary | ICD-10-CM | POA: Diagnosis not present

## 2021-04-08 DIAGNOSIS — R0602 Shortness of breath: Secondary | ICD-10-CM | POA: Diagnosis not present

## 2021-04-08 DIAGNOSIS — J9601 Acute respiratory failure with hypoxia: Secondary | ICD-10-CM | POA: Diagnosis not present

## 2021-04-08 DIAGNOSIS — I350 Nonrheumatic aortic (valve) stenosis: Secondary | ICD-10-CM | POA: Diagnosis not present

## 2021-04-08 DIAGNOSIS — I5021 Acute systolic (congestive) heart failure: Secondary | ICD-10-CM | POA: Diagnosis not present

## 2021-04-08 DIAGNOSIS — I480 Paroxysmal atrial fibrillation: Secondary | ICD-10-CM | POA: Diagnosis not present

## 2021-04-08 DIAGNOSIS — I5043 Acute on chronic combined systolic (congestive) and diastolic (congestive) heart failure: Secondary | ICD-10-CM | POA: Diagnosis not present

## 2021-04-08 LAB — BASIC METABOLIC PANEL
Anion gap: 12 (ref 5–15)
BUN: 36 mg/dL — ABNORMAL HIGH (ref 8–23)
CO2: 23 mmol/L (ref 22–32)
Calcium: 9.1 mg/dL (ref 8.9–10.3)
Chloride: 102 mmol/L (ref 98–111)
Creatinine, Ser: 1.12 mg/dL — ABNORMAL HIGH (ref 0.44–1.00)
GFR, Estimated: 49 mL/min — ABNORMAL LOW (ref >60)
Glucose, Bld: 156 mg/dL — ABNORMAL HIGH (ref 70–99)
Potassium: 3.7 mmol/L (ref 3.5–5.1)
Sodium: 137 mmol/L (ref 135–145)

## 2021-04-08 LAB — ECHOCARDIOGRAM COMPLETE
AR max vel: 1.64 cm2
AV Area VTI: 1.59 cm2
AV Area mean vel: 1.62 cm2
AV Mean grad: 10 mmHg
AV Peak grad: 20.8 mmHg
Ao pk vel: 2.28 m/s
Area-P 1/2: 1.55 cm2
Calc EF: 45.9 %
Height: 61 in
MV VTI: 1.81 cm2
S' Lateral: 4 cm
Single Plane A2C EF: 31.6 %
Single Plane A4C EF: 59.6 %
Weight: 2666.68 oz

## 2021-04-08 MED ORDER — BISOPROLOL FUMARATE 5 MG PO TABS
2.5000 mg | ORAL_TABLET | Freq: Every day | ORAL | 1 refills | Status: DC
Start: 2021-04-09 — End: 2021-08-05

## 2021-04-08 MED ORDER — PERFLUTREN LIPID MICROSPHERE
1.0000 mL | INTRAVENOUS | Status: DC | PRN
Start: 2021-04-08 — End: 2021-04-08
  Administered 2021-04-08: 16:00:00 3 mL via INTRAVENOUS
  Filled 2021-04-08: qty 10

## 2021-04-08 MED ORDER — LIVING BETTER WITH HEART FAILURE BOOK: Freq: Once | Status: DC

## 2021-04-08 MED ORDER — LOSARTAN POTASSIUM 25 MG PO TABS: 12.5000 mg | ORAL_TABLET | Freq: Every day | ORAL | Status: DC

## 2021-04-08 NOTE — Progress Notes (Addendum)
Progress Note  Patient Name: [name] Date of Encounter: 04/08/2021  [facility] Cardiologist: Dorris Carnes, MD   Subjective   Breathing back to baseline today after receiving steroids and IV Lasix yesterday. No orthopnea or PND. No chest pain or palpitations. Biggest issue this AM is a headache and she reports typically consuming several cups of coffee daily but only had one this AM.   Inpatient Medications    Scheduled Meds:  apixaban  5 mg Oral BID   atorvastatin  20 mg Oral Daily   bisoprolol  2.5 mg Oral Daily   pantoprazole  40 mg Oral Daily   sodium chloride flush  3 mL Intravenous Q12H   spironolactone  25 mg Oral Daily   Continuous Infusions:  sodium chloride     PRN Meds: sodium chloride, acetaminophen, ondansetron (ZOFRAN) IV, sodium chloride flush   Vital Signs    Vitals:   04/07/21 2216 04/07/21 2348 04/08/21 0449 04/08/21 0521  BP: (!) 89/44 (!) 91/49 108/68 111/60  Pulse: 66 62  (!) 55  Resp: 18 19  18   Temp: 98.4 F (36.9 C) 98.2 F (36.8 C)  97.8 F (36.6 C)  TempSrc:      SpO2: 91% 94%  94%  Weight:    75.6 kg  Height:        Intake/Output Summary (Last 24 hours) at 04/08/2021 0901 Last data filed at 04/08/2021 0600 Gross per 24 hour  Intake 1080 ml  Output 300 ml  Net 780 ml   Last 3 Weights 04/08/2021 04/07/2021 04/07/2021  Weight (lbs) 166 lb 10.7 oz 166 lb 14.4 oz 170 lb  Weight (kg) 75.6 kg 75.705 kg 77.111 kg      Telemetry    SInus bradycardia, HR in 50's to 60's. Brief episode of narrow-complex tachycardia overnight lasting less than 10 seconds which appears most consistent with an atrial tachycardia but could be brief atrial fibrillation.  - Personally Reviewed  ECG    No new tracings.   Physical Exam   GEN: Pleasant elderly female appearing in no acute distress.   Neck: No JVD Cardiac: RRR, 2/6 SEM along RUSB.  Respiratory: Clear to auscultation bilaterally. GI: Soft, nontender, non-distended  MS: No  pitting edema; No deformity. Neuro:  Nonfocal  Psych: Normal affect   Labs    High Sensitivity Troponin:   Recent Labs  Lab 04/07/21 1118 04/07/21 1312  TROPONINIHS 4 4     Chemistry Recent Labs  Lab 04/07/21 1118 04/08/21 0639  NA 141 137  K 3.9 3.7  CL 107 102  CO2 22 23  GLUCOSE 129* 156*  BUN 31* 36*  CREATININE 0.93 1.12*  CALCIUM 9.3 9.1  GFRNONAA >60 49*  ANIONGAP 12 12     Hematology Recent Labs  Lab 04/07/21 1118  WBC 30.9*  RBC 4.40  HGB 13.7  HCT 41.0  MCV 93.2  MCH 31.1  MCHC 33.4  RDW 14.2  PLT 210    BNP Recent Labs  Lab 04/07/21 1118  BNP 512.0*     Radiology    CT Angio Chest PE W and/or Wo Contrast  Result Date: 04/07/2021 CLINICAL DATA:  SOB chronic worse since yesterday, Cough. Pt has hx of COPd. Pt took @ home breathing tx last night w/ relief. EXAM: CT ANGIOGRAPHY CHEST WITH CONTRAST TECHNIQUE: Multidetector CT imaging of the chest was performed using the standard protocol during bolus administration of intravenous contrast. Multiplanar CT image reconstructions and MIPs were obtained to  evaluate the vascular anatomy. CONTRAST:  25mL OMNIPAQUE IOHEXOL 350 MG/ML SOLN COMPARISON:  04/23/2019.  Current chest radiograph. FINDINGS: Cardiovascular: Pulmonary arteries are well opacified. There is no evidence of a pulmonary embolism. Heart mildly enlarged. Three-vessel coronary artery calcifications. Great vessels are normal in caliber. No aortic dissection. Mild aortic atherosclerosis. Mediastinum/Nodes: No mediastinal or hilar masses or enlarged lymph nodes. Trachea and esophagus are unremarkable. Lungs/Pleura: Peripheral areas of interstitial thickening/reticulation, chronic and unchanged. No lung consolidation. No evidence of pulmonary edema. No mass or suspicious nodule. No pleural effusion or pneumothorax. Upper Abdomen: No acute abnormality. Musculoskeletal: Mild stable wedge-shaped compression deformity of T6. Slight depression of the upper  endplate of K93, also unchanged. No acute fracture. No bone lesions. Review of the MIP images confirms the above findings. IMPRESSION: 1. No evidence of a pulmonary embolism. 2. No acute findings. 3. Three-vessel coronary artery calcifications and mild aortic atherosclerosis. Aortic Atherosclerosis (ICD10-I70.0). Electronically Signed   By: Lajean Manes M.D.   On: 04/07/2021 16:17   DG Chest Port 1 View  Result Date: 04/07/2021 CLINICAL DATA:  Shortness of breath with exertion.  Cough. EXAM: PORTABLE CHEST 1 VIEW COMPARISON:  03/25/2019 FINDINGS: There is cardiomegaly and aortic atherosclerosis. The upper lungs are clear. Minimal atelectasis or scarring at the lung bases. No visible effusion. No acute bone finding. IMPRESSION: Cardiomegaly and aortic atherosclerosis. Minimal atelectasis or scarring at the lung bases. Electronically Signed   By: Nelson Chimes M.D.   On: 04/07/2021 11:15    Cardiac Studies   R/LHC: 06/2020 Conclusions: Mild to moderate, non-obstructive coronary artery disease with 30-40% mid LAD stenosis and mild luminal irregularities involving the LCx and RCA. Severely reduced left ventricular systolic function (LVEF 26-71%). Upper normal left heart, right heart, and pulmonary artery pressures. Normal Fick cardiac output/index. Mild aortic stenosis.   Recommendations: Medical therapy and risk factor modification to prevent progression of coronary artery disease. Escalate goal directed medical therapy for treatment of nonischemic cardiomyopathy, as tolerated. Restart apixaban tomorrow morning if no evidence of bleeding/vascular complication.    Echocardiogram: 12/2020 IMPRESSIONS    1. Left ventricular ejection fraction, by estimation, is 35 to 40%. The  left ventricle has moderately decreased function. The left ventricle  demonstrates regional wall motion abnormalities (see scoring  diagram/findings for description). The left  ventricular internal cavity size was mildly  dilated. Left ventricular  diastolic parameters are consistent with Grade I diastolic dysfunction  (impaired relaxation).   2. Right ventricular systolic function is normal. The right ventricular  size is normal. Tricuspid regurgitation signal is inadequate for assessing  PA pressure.   3. Mild mitral valve regurgitation.   4. The aortic valve is tricuspid. There is moderate calcification of the  aortic valve. Aortic valve regurgitation is not visualized. Mild aortic  valve stenosis. Aortic valve mean gradient measures 10.5 mmHg.  Dimentionless index 0.46.   5. The inferior vena cava is normal in size with greater than 50%  respiratory variability, suggesting right atrial pressure of 3 mmHg.  Patient Profile     85 y.o. female w/ PMH of paroxysmal atrial fibrillation, HFrEF/NICM (EF previously 55-60% in 06/2018, reduced to 40-45% by repeat echo in 03/2019 and at 35-40% by echo in 05/2020 and 12/2020), CAD (cath in 06/2020 showing mild to moderate nonobstructive CAD), aortic stenosis, LBBB, CLL and COPD who is currently admitted for acute hypoxic respiratory failure.   Assessment & Plan    1. Acute HFrEF - Presented with worsening dyspnea on exertion and BNP at  512 this admission. CXR showed cardiomegaly and aortic atherosclerosis and minimal atelectasis. CTA was negative for a PE. Repeat echo pending.  - She received IV Lasix 40mg  x 1 yesterday and I&O's not fully recorded but weight listed as having declined by 3 lbs to 166 lbs (close to baseline weight as recorded at 167 lsb in 12/2020 and 170 lbs on 03/29/2021).  - Medical therapy limited as she previously stopped Losartan due to headaches and nausea and stopped Lisinopril due to similar effects and BP did not previously allow for switching to [facility]. Also previously on Jardiance but had self-discontinued at the time of her visit with Dr. Harrington Challenger on 03/29/2021 due to reporting worsening dyspnea on Jardiance.  - In discussing with the patient  today, she is open to trying Losartan 12.5mg  daily again to see if she can tolerate and will also need to follow BP closely. We also discussed trying [place] instead of Jardiance but will address again as an outpatient as she wants to make limited medication changes. Would send home with an Rx for PRN Lasix 20mg  to take as needed for edema or weight gain. Continue Bisoprolol and Spironolactone at current dosing.   2. Paroxysmal Atrial Fibrillation - She has mostly been in NSR since admission with HR in the 50's. Brief tachycardia overnight but lasting less than 10 seconds. Remains on Bisoprolol 2.5mg  daily. Have not titrated dosing given baseline bradycardia. May need to consider Amiodarone as previously discussed if she continues to have break-through episodes but at this time she is hesitant to multiple medication changes.   - Remains on Eliquis 5mg  BID for anticoagulation which is the appropriate dose at this time given her age of 85 yo, creatinine 1.12 and weight of 166 lbs.   3. CAD - She had mild to moderate nonobstructive CAD as outlined above by cath in 06/2020. Hs Troponin values have been negative this admission.  - Continue BB and statin therapy. No ASA given the need for anticoagulation.   4. Aortic Stenosis - Mild by echocardiogram in 12/2020. Repeat echo pending.   5. CLL - Followed by Oncology as an outpatient.   For questions or updates, please contact [place] Please consult [email or web address] for contact info under        Signed, Erma Heritage, PA-C  04/08/2021, 9:01 AM      Attending note:  Patient seen and examined.  I discussed the case with Ms. Ahmed Prima PA-C and agree with her above findings and recommendations.  Ms. Spurr is feeling better today, shortness of breath improved.  She received a single dose of Lasix yesterday.  On examination she appears comfortable, lungs are clear.  Cardiac exam with RRR and no gallop.  Telemetry showed only a very brief  episode of atrial fibrillation, nothing sustained.  She is getting a follow-up echocardiogram this morning.  Current medications include Eliquis, bisoprolol, Lipitor, Aldactone, and we are starting low-dose losartan as previously planned.  No clear indication to initiate antiarrhythmic therapy at this point, can be reconsidered as an outpatient if rhythm burden increases.  We are going to set up outpatient follow-up for her, anticipate discharge home today unless there are dramatic changes in her echocardiogram.  Would recommend prescription for low-dose Lasix 20 mg as needed.  Satira Sark, M.D., F.A.C.C.

## 2021-04-08 NOTE — Progress Notes (Signed)
*  PRELIMINARY RESULTS* Echocardiogram 2D Echocardiogram has been performed.  Elpidio Anis 04/08/2021, 11:08 AM

## 2021-04-08 NOTE — Discharge Summary (Signed)
Physician Discharge Summary  [name] YOV:[id number] DOB: 09-Sep-1935 DOA: 04/07/2021  PCP: Lemmie Evens, MD  Admit date: 04/07/2021 Discharge date: 04/08/2021  Admitted From: Home Disposition:  Home   Recommendations for Outpatient Follow-up:  Follow up with PCP in 1-2 weeks Please obtain BMP/CBC in one week     Discharge Condition: Stable CODE STATUS: FULL Diet recommendation: Heart Healthy    Brief/Interim Summary: 85 y.o. female with medical history of paroxysmal atrial fibrillation, CLL, HFrEF, coronary disease, LBBB right-sided breast cancer presenting with shortness of breath that began in the afternoon on 04/06/2021.  However, the patient states that she has been having orthopnea type symptoms that have been worsening over the past week.  She has been using her nebulizer and inhalers at home with some relief, but her shortness of breath and dyspnea on exertion would return.  Does not any fevers, chills, headache, neck pain, hemoptysis, nausea, vomiting, diarrhea.  She has a chronic nonproductive cough.  The patient quit smoking 40 years ago.  She only smoked 1 cigarette every 3 to 4 days.  She denies any worsening lower extremity edema or radiating abdominal girth.  She denies any diarrhea, hematochezia, melena, dysuria, hematuria.  She has not been started on any new medications recently.   ED In the emergency department, patient was afebrile and hemodynamically stable with oxygen saturation 95-96% on room air.  However, when the patient was ambulated her oxygen saturation decreases to 90%.  WBC 30.9, hemoglobin 13.7, platelets 210,000.  MP showed a sodium 141, sodium 3.9, serum creatinine 0.93.  BNP 512.  Troponin 4>> 4.  Chest x-ray showed bibasilar opacities, left greater than right.  She was given a one time dose lasix IV 40 mg with clinical improvement.  Cardiology was consulted.  They agreed with lasix and felt she could go home with prn po lasix 20 mg      Discharge Diagnoses:   Acute respiratory failure with hypoxia/dyspnea -With tachypnea in the upper 20s with saturation 90% with ambulation -Suspect cardiac etiology with a degree of fluid overload in the setting of atrial fibrillation--during my eval pt had brief episodes RVR [id number] -She may certainly be having RVR episodes -CTA chest--no PE, no acute findings -stable on RA at time of d/c without desaturation upon ambulation   Acute on chronic systolic and diastolic CHF -12/27/74 Echo--EF 35-40%+WMA, G1DD, mild MR, mild AS -lasix IV x 1 and reassed -cardiology recommends prn lasix 20 mg prn weight gain >3 lbs or edema -restart losartan, low dose--12.5 mg daily -continue spironolactone -11/17 Echo--EF 35-40%, +WMA, G1DD, mild MR/AS   CLL (chronic lymphocytic leukemia) -WBC is a stable-continue patient follow-up with oncology service.   PAF (paroxysmal atrial fibrillation) -Continue Eliquis for secondary prevention -continue  bisoprolol  -mostly been in NSR since admission with HR in the 50's. Brief tachycardia overnight but lasting less than 10 seconds. Remains on Bisoprolol 2.38m daily. Have not titrated dosing given baseline bradycardia.   LBBB -chronic   Hyperlipidemia -continue statin   CAD -she appears to have atypical chest pain -06/220 cath--mild to mod nonobstructive CAD -troponin 4>>4    Discharge Instructions   Allergies as of 04/08/2021       Reactions   Codeine Nausea And Vomiting   headache   Losartan Nausea Only   Nauseated and headaches   Meclizine Other (See Comments)   Made dizziness worse.   Sulfa Antibiotics Nausea And Vomiting   Stomach upset   Zoloft [sertraline Hcl] Anxiety  Medication List     STOP taking these medications    lisinopril 5 MG tablet Commonly known as: ZESTRIL   OneTouch Verio Flex System w/Device Kit       TAKE these medications    acetaminophen 500 MG tablet Commonly known as: TYLENOL Take 1,000  mg by mouth every 6 (six) hours as needed for moderate pain or headache.   albuterol 108 (90 Base) MCG/ACT inhaler Commonly known as: VENTOLIN HFA Inhale 2 puffs into the lungs every 6 (six) hours as needed for shortness of breath.   albuterol (2.5 MG/3ML) 0.083% nebulizer solution Commonly known as: PROVENTIL Take 3 mLs (2.5 mg total) by nebulization every 6 (six) hours as needed for wheezing or shortness of breath (when not relieved by inhaler.).   atorvastatin 20 MG tablet Commonly known as: LIPITOR Take 1 tablet (20 mg total) by mouth daily.   bisoprolol 5 MG tablet Commonly known as: ZEBETA Take 0.5 tablets (2.5 mg total) by mouth daily. Start taking on: April 09, 2021 What changed:  how much to take Another medication with the same name was removed. Continue taking this medication, and follow the directions you see here.   diphenhydrAMINE 25 MG tablet Commonly known as: BENADRYL Take 25 mg by mouth daily as needed for allergies.   Eliquis 5 MG Tabs tablet Generic drug: apixaban TAKE 1 TABLET BY MOUTH 2 TIMES DAILY.   ergocalciferol 1.25 MG (50000 UT) capsule Commonly known as: VITAMIN D2 Take 1 capsule (50,000 Units total) by mouth every Wednesday.   losartan 25 MG tablet Commonly known as: COZAAR Take 0.5 tablets (12.5 mg total) by mouth daily.   omeprazole 20 MG capsule Commonly known as: PRILOSEC Take 20 mg by mouth daily.   SALONPAS GEL EX Apply topically.   spironolactone 25 MG tablet Commonly known as: ALDACTONE Take 1 tablet (25 mg total) by mouth daily.        Follow-up Information     Erma Heritage, PA-C Follow up on 05/13/2021.   Specialties: Physician Assistant, Cardiology Why: [facility] Follow-up on 05/13/2021 at 3:30 PM. Contact information: [place] [address] [phone]                Allergies  Allergen Reactions   Codeine Nausea And Vomiting    headache   Losartan Nausea Only     Nauseated and headaches   Meclizine Other (See Comments)    Made dizziness worse.   Sulfa Antibiotics Nausea And Vomiting    Stomach upset   Zoloft [Sertraline Hcl] Anxiety    Consultations: cardiology   Procedures/Studies: CT Angio Chest PE W and/or Wo Contrast  Result Date: 04/07/2021 CLINICAL DATA:  SOB chronic worse since yesterday, Cough. Pt has hx of COPd. Pt took @ home breathing tx last night w/ relief. EXAM: CT ANGIOGRAPHY CHEST WITH CONTRAST TECHNIQUE: Multidetector CT imaging of the chest was performed using the standard protocol during bolus administration of intravenous contrast. Multiplanar CT image reconstructions and MIPs were obtained to evaluate the vascular anatomy. CONTRAST:  45m OMNIPAQUE IOHEXOL 350 MG/ML SOLN COMPARISON:  04/23/2019.  Current chest radiograph. FINDINGS: Cardiovascular: Pulmonary arteries are well opacified. There is no evidence of a pulmonary embolism. Heart mildly enlarged. Three-vessel coronary artery calcifications. Great vessels are normal in caliber. No aortic dissection. Mild aortic atherosclerosis. Mediastinum/Nodes: No mediastinal or hilar masses or enlarged lymph nodes. Trachea and esophagus are unremarkable. Lungs/Pleura: Peripheral areas of interstitial thickening/reticulation, chronic and unchanged. No lung consolidation. No  evidence of pulmonary edema. No mass or suspicious nodule. No pleural effusion or pneumothorax. Upper Abdomen: No acute abnormality. Musculoskeletal: Mild stable wedge-shaped compression deformity of T6. Slight depression of the upper endplate of H84, also unchanged. No acute fracture. No bone lesions. Review of the MIP images confirms the above findings. IMPRESSION: 1. No evidence of a pulmonary embolism. 2. No acute findings. 3. Three-vessel coronary artery calcifications and mild aortic atherosclerosis. Aortic Atherosclerosis (ICD10-I70.0). Electronically Signed   By: Lajean Manes M.D.   On: 04/07/2021 16:17   DG Chest  Port 1 View  Result Date: 04/07/2021 CLINICAL DATA:  Shortness of breath with exertion.  Cough. EXAM: PORTABLE CHEST 1 VIEW COMPARISON:  03/25/2019 FINDINGS: There is cardiomegaly and aortic atherosclerosis. The upper lungs are clear. Minimal atelectasis or scarring at the lung bases. No visible effusion. No acute bone finding. IMPRESSION: Cardiomegaly and aortic atherosclerosis. Minimal atelectasis or scarring at the lung bases. Electronically Signed   By: Nelson Chimes M.D.   On: 04/07/2021 11:15   ECHOCARDIOGRAM COMPLETE  Result Date: 04/08/2021    ECHOCARDIOGRAM REPORT   Patient Name:   [name] Date of Exam: 04/08/2021 Medical Rec #:  [id number]         Height:       61.0 in Accession #:    [id number]        Weight:       166.7 lb Date of Birth:  [birth year]         BSA:          1.748 m Patient Age:    53 years          BP:           111/60 mmHg Patient Gender: F                 HR:           55 bpm. Exam Location:  [facility] Procedure: 2D Echo, Cardiac Doppler and Color Doppler Indications:    CHF-Acute Systolic  History:        Patient has prior history of Echocardiogram examinations, most                 recent 12/21/2020. CHF, COPD, Arrythmias:Atrial Fibrillation,                 Signs/Symptoms:Dyspnea; Risk Factors:Dyslipidemia. Breast CA.  Sonographer:    Wenda Low Referring Phys: [phone] Samiya Mervin IMPRESSIONS  1. Left ventricular ejection fraction, by estimation, is 35 to 40%. The left ventricle has moderately decreased function. The left ventricle demonstrates regional wall motion abnormalities (see scoring diagram/findings for description). There is moderate left ventricular hypertrophy. Left ventricular diastolic parameters are consistent with Grade I diastolic dysfunction (impaired relaxation). Elevated left atrial pressure.  2. Right ventricular systolic function is normal. The right ventricular size is normal. There is mildly elevated pulmonary artery systolic pressure.  3. Left  atrial size was moderately dilated.  4. Right atrial size was mild to moderately dilated.  5. The mitral valve is abnormal. Mild mitral valve regurgitation. Mild mitral stenosis. Moderate mitral annular calcification.  6. The aortic valve is tricuspid. There is moderate calcification of the aortic valve. There is moderate thickening of the aortic valve. Aortic valve regurgitation is not visualized. Mild aortic valve stenosis. FINDINGS  Left Ventricle: The anteroseptum, anterior wall, and anterolateral walls are hypokinetic. Left ventricular ejection fraction, by estimation, is 35 to 40%. The left ventricle has moderately decreased function. The  left ventricle demonstrates regional wall motion abnormalities. The left ventricular internal cavity size was normal in size. There is moderate left ventricular hypertrophy. Left ventricular diastolic parameters are consistent with Grade I diastolic dysfunction (impaired relaxation). Elevated left atrial pressure. Right Ventricle: The right ventricular size is normal. No increase in right ventricular wall thickness. Right ventricular systolic function is normal. There is mildly elevated pulmonary artery systolic pressure. The tricuspid regurgitant velocity is 2.66  m/s, and with an assumed right atrial pressure of 8 mmHg, the estimated right ventricular systolic pressure is 16.0 mmHg. Left Atrium: Left atrial size was moderately dilated. Right Atrium: Right atrial size was mild to moderately dilated. Pericardium: There is no evidence of pericardial effusion. Mitral Valve: The mitral valve is abnormal. There is moderate thickening of the mitral valve leaflet(s). There is moderate calcification of the mitral valve leaflet(s). Moderate mitral annular calcification. Mild mitral valve regurgitation. Mild mitral valve stenosis. MV peak gradient, 9.5 mmHg. The mean mitral valve gradient is 4.0 mmHg. Tricuspid Valve: The tricuspid valve is normal in structure. Tricuspid valve  regurgitation is mild . No evidence of tricuspid stenosis. Aortic Valve: The aortic valve is tricuspid. There is moderate calcification of the aortic valve. There is moderate thickening of the aortic valve. There is moderate aortic valve annular calcification. Aortic valve regurgitation is not visualized. Mild aortic stenosis is present. Aortic valve mean gradient measures 10.0 mmHg. Aortic valve peak gradient measures 20.8 mmHg. Aortic valve area, by VTI measures 1.59 cm. Pulmonic Valve: The pulmonic valve was not well visualized. Pulmonic valve regurgitation is not visualized. No evidence of pulmonic stenosis. Aorta: The aortic root is normal in size and structure. IAS/Shunts: No atrial level shunt detected by color flow Doppler.  LEFT VENTRICLE PLAX 2D LVIDd:         5.00 cm      Diastology LVIDs:         4.00 cm      LV e' medial:    4.14 cm/s LV PW:         1.40 cm      LV E/e' medial:  21.9 LV IVS:        1.40 cm      LV e' lateral:   5.68 cm/s LVOT diam:     2.00 cm      LV E/e' lateral: 16.0 LV SV:         86 LV SV Index:   49 LVOT Area:     3.14 cm  LV Volumes (MOD) LV vol d, MOD A2C: 97.0 ml LV vol d, MOD A4C: 119.0 ml LV vol s, MOD A2C: 66.3 ml LV vol s, MOD A4C: 48.1 ml LV SV MOD A2C:     30.7 ml LV SV MOD A4C:     119.0 ml LV SV MOD BP:      50.0 ml RIGHT VENTRICLE RV Basal diam:  4.00 cm RV Mid diam:    3.00 cm RV S prime:     9.39 cm/s TAPSE (M-mode): 2.6 cm LEFT ATRIUM             Index        RIGHT ATRIUM           Index LA diam:        4.30 cm 2.46 cm/m   RA Area:     23.50 cm LA Vol (A2C):   85.0 ml 48.63 ml/m  RA Volume:   74.50 ml  42.62 ml/m LA Vol (  A4C):   84.3 ml 48.23 ml/m LA Biplane Vol: 91.5 ml 52.35 ml/m  AORTIC VALVE                     PULMONIC VALVE AV Area (Vmax):    1.64 cm      PV Vmax:       0.74 m/s AV Area (Vmean):   1.62 cm      PV Peak grad:  2.2 mmHg AV Area (VTI):     1.59 cm AV Vmax:           228.00 cm/s AV Vmean:          146.000 cm/s AV VTI:            0.541 m  AV Peak Grad:      20.8 mmHg AV Mean Grad:      10.0 mmHg LVOT Vmax:         119.00 cm/s LVOT Vmean:        75.300 cm/s LVOT VTI:          0.273 m LVOT/AV VTI ratio: 0.50  AORTA Ao Root diam: 2.65 cm Ao Asc diam:  3.50 cm MITRAL VALVE                TRICUSPID VALVE MV Area (PHT): 1.55 cm     TR Peak grad:   28.3 mmHg MV Area VTI:   1.81 cm     TR Vmax:        266.00 cm/s MV Peak grad:  9.5 mmHg MV Mean grad:  4.0 mmHg     SHUNTS MV Vmax:       1.54 m/s     Systemic VTI:  0.27 m MV Vmean:      90.4 cm/s    Systemic Diam: 2.00 cm MV Decel Time: 491 msec MV E velocity: 90.80 cm/s MV A velocity: 121.00 cm/s MV E/A ratio:  0.75 Carlyle Dolly MD Electronically signed by Carlyle Dolly MD Signature Date/Time: 04/08/2021/4:21:45 PM    Final         Discharge Exam: Vitals:   04/08/21 0521 04/08/21 1309  BP: 111/60 (!) 146/127  Pulse: (!) 55 64  Resp: 18 18  Temp: 97.8 F (36.6 C) 98 F (36.7 C)  SpO2: 94% 98%   Vitals:   04/07/21 2348 04/08/21 0449 04/08/21 0521 04/08/21 1309  BP: (!) 91/49 108/68 111/60 (!) 146/127  Pulse: 62  (!) 55 64  Resp: _0 Temp: 98.2 F (36.8 C)  97.8 F (36.6 C) 98 F (36.7 C)  TempSrc:    Oral  SpO2: 94%  94% 98%  Weight:   75.6 kg   Height:        General: Pt is alert, awake, not in acute distress Cardiovascular: IRRR, S1/S2 +, no rubs, no gallops Respiratory: CTA bilaterally, no wheezing, no rhonchi Abdominal: Soft, NT, ND, bowel sounds + Extremities: trace LE edema, no cyanosis   The results of significant diagnostics from this hospitalization (including imaging, microbiology, ancillary and laboratory) are listed below for reference.    Significant Diagnostic Studies: CT Angio Chest PE W and/or Wo Contrast  Result Date: 04/07/2021 CLINICAL DATA:  SOB chronic worse since yesterday, Cough. Pt has hx of COPd. Pt took @ home breathing tx last night w/ relief. EXAM: CT ANGIOGRAPHY CHEST WITH CONTRAST TECHNIQUE: Multidetector CT imaging of the  chest was performed using the standard protocol during bolus administration of intravenous contrast. Multiplanar  CT image reconstructions and MIPs were obtained to evaluate the vascular anatomy. CONTRAST:  50m OMNIPAQUE IOHEXOL 350 MG/ML SOLN COMPARISON:  04/23/2019.  Current chest radiograph. FINDINGS: Cardiovascular: Pulmonary arteries are well opacified. There is no evidence of a pulmonary embolism. Heart mildly enlarged. Three-vessel coronary artery calcifications. Great vessels are normal in caliber. No aortic dissection. Mild aortic atherosclerosis. Mediastinum/Nodes: No mediastinal or hilar masses or enlarged lymph nodes. Trachea and esophagus are unremarkable. Lungs/Pleura: Peripheral areas of interstitial thickening/reticulation, chronic and unchanged. No lung consolidation. No evidence of pulmonary edema. No mass or suspicious nodule. No pleural effusion or pneumothorax. Upper Abdomen: No acute abnormality. Musculoskeletal: Mild stable wedge-shaped compression deformity of T6. Slight depression of the upper endplate of TX90 also unchanged. No acute fracture. No bone lesions. Review of the MIP images confirms the above findings. IMPRESSION: 1. No evidence of a pulmonary embolism. 2. No acute findings. 3. Three-vessel coronary artery calcifications and mild aortic atherosclerosis. Aortic Atherosclerosis (ICD10-I70.0). Electronically Signed   By: DLajean ManesM.D.   On: 04/07/2021 16:17   DG Chest Port 1 View  Result Date: 04/07/2021 CLINICAL DATA:  Shortness of breath with exertion.  Cough. EXAM: PORTABLE CHEST 1 VIEW COMPARISON:  03/25/2019 FINDINGS: There is cardiomegaly and aortic atherosclerosis. The upper lungs are clear. Minimal atelectasis or scarring at the lung bases. No visible effusion. No acute bone finding. IMPRESSION: Cardiomegaly and aortic atherosclerosis. Minimal atelectasis or scarring at the lung bases. Electronically Signed   By: MNelson ChimesM.D.   On: 04/07/2021 11:15    ECHOCARDIOGRAM COMPLETE  Result Date: 04/08/2021    ECHOCARDIOGRAM REPORT   Patient Name:   [name] of Exam: 04/08/2021 Medical Rec #:  [id number]        Height:       61.0 in Accession #:    [id number]       Weight:       166.7 lb Date of Birth:  [birth year]        BSA:          1.748 m Patient Age:    89years          BP:           111/60 mmHg Patient Gender: F                 HR:           55 bpm. [facility], Cardiac Doppler and Color Doppler Indications:    CHF-Acute Systolic  History:        Patient has prior history of Echocardiogram examinations, most                 recent 12/21/2020. CHF, COPD, Arrythmias:Atrial Fibrillation,                 Signs/Symptoms:Dyspnea; Risk Factors:Dyslipidemia. Breast CA.  Sonographer:    DWenda LowReferring Phys: [phone] Takyia Sindt IMPRESSIONS  1. Left ventricular ejection fraction, by estimation, is 35 to 40%. The left ventricle has moderately decreased function. The left ventricle demonstrates regional wall motion abnormalities (see scoring diagram/findings for description). There is moderate left ventricular hypertrophy. Left ventricular diastolic parameters are consistent with Grade I diastolic dysfunction (impaired relaxation). Elevated left atrial pressure.  2. Right ventricular systolic function is normal. The right ventricular size is normal. There is mildly elevated pulmonary artery systolic pressure.  3. Left atrial size was moderately dilated.  4. Right atrial  size was mild to moderately dilated.  5. The mitral valve is abnormal. Mild mitral valve regurgitation. Mild mitral stenosis. Moderate mitral annular calcification.  6. The aortic valve is tricuspid. There is moderate calcification of the aortic valve. There is moderate thickening of the aortic valve. Aortic valve regurgitation is not visualized. Mild aortic valve stenosis. FINDINGS  Left Ventricle: The anteroseptum, anterior wall, and anterolateral  walls are hypokinetic. Left ventricular ejection fraction, by estimation, is 35 to 40%. The left ventricle has moderately decreased function. The left ventricle demonstrates regional wall motion abnormalities. The left ventricular internal cavity size was normal in size. There is moderate left ventricular hypertrophy. Left ventricular diastolic parameters are consistent with Grade I diastolic dysfunction (impaired relaxation). Elevated left atrial pressure. Right Ventricle: The right ventricular size is normal. No increase in right ventricular wall thickness. Right ventricular systolic function is normal. There is mildly elevated pulmonary artery systolic pressure. The tricuspid regurgitant velocity is 2.66  m/s, and with an assumed right atrial pressure of 8 mmHg, the estimated right ventricular systolic pressure is 41.2 mmHg. Left Atrium: Left atrial size was moderately dilated. Right Atrium: Right atrial size was mild to moderately dilated. Pericardium: There is no evidence of pericardial effusion. Mitral Valve: The mitral valve is abnormal. There is moderate thickening of the mitral valve leaflet(s). There is moderate calcification of the mitral valve leaflet(s). Moderate mitral annular calcification. Mild mitral valve regurgitation. Mild mitral valve stenosis. MV peak gradient, 9.5 mmHg. The mean mitral valve gradient is 4.0 mmHg. Tricuspid Valve: The tricuspid valve is normal in structure. Tricuspid valve regurgitation is mild . No evidence of tricuspid stenosis. Aortic Valve: The aortic valve is tricuspid. There is moderate calcification of the aortic valve. There is moderate thickening of the aortic valve. There is moderate aortic valve annular calcification. Aortic valve regurgitation is not visualized. Mild aortic stenosis is present. Aortic valve mean gradient measures 10.0 mmHg. Aortic valve peak gradient measures 20.8 mmHg. Aortic valve area, by VTI measures 1.59 cm. Pulmonic Valve: The pulmonic valve  was not well visualized. Pulmonic valve regurgitation is not visualized. No evidence of pulmonic stenosis. Aorta: The aortic root is normal in size and structure. IAS/Shunts: No atrial level shunt detected by color flow Doppler.  LEFT VENTRICLE PLAX 2D LVIDd:         5.00 cm      Diastology LVIDs:         4.00 cm      LV e' medial:    4.14 cm/s LV PW:         1.40 cm      LV E/e' medial:  21.9 LV IVS:        1.40 cm      LV e' lateral:   5.68 cm/s LVOT diam:     2.00 cm      LV E/e' lateral: 16.0 LV SV:         86 LV SV Index:   49 LVOT Area:     3.14 cm  LV Volumes (MOD) LV vol d, MOD A2C: 97.0 ml LV vol d, MOD A4C: 119.0 ml LV vol s, MOD A2C: 66.3 ml LV vol s, MOD A4C: 48.1 ml LV SV MOD A2C:     30.7 ml LV SV MOD A4C:     119.0 ml LV SV MOD BP:      50.0 ml RIGHT VENTRICLE RV Basal diam:  4.00 cm RV Mid diam:    3.00 cm RV S prime:  9.39 cm/s TAPSE (M-mode): 2.6 cm LEFT ATRIUM             Index        RIGHT ATRIUM           Index LA diam:        4.30 cm 2.46 cm/m   RA Area:     23.50 cm LA Vol (A2C):   85.0 ml 48.63 ml/m  RA Volume:   74.50 ml  42.62 ml/m LA Vol (A4C):   84.3 ml 48.23 ml/m LA Biplane Vol: 91.5 ml 52.35 ml/m  AORTIC VALVE                     PULMONIC VALVE AV Area (Vmax):    1.64 cm      PV Vmax:       0.74 m/s AV Area (Vmean):   1.62 cm      PV Peak grad:  2.2 mmHg AV Area (VTI):     1.59 cm AV Vmax:           228.00 cm/s AV Vmean:          146.000 cm/s AV VTI:            0.541 m AV Peak Grad:      20.8 mmHg AV Mean Grad:      10.0 mmHg LVOT Vmax:         119.00 cm/s LVOT Vmean:        75.300 cm/s LVOT VTI:          0.273 m LVOT/AV VTI ratio: 0.50  AORTA Ao Root diam: 2.65 cm Ao Asc diam:  3.50 cm MITRAL VALVE                TRICUSPID VALVE MV Area (PHT): 1.55 cm     TR Peak grad:   28.3 mmHg MV Area VTI:   1.81 cm     TR Vmax:        266.00 cm/s MV Peak grad:  9.5 mmHg MV Mean grad:  4.0 mmHg     SHUNTS MV Vmax:       1.54 m/s     Systemic VTI:  0.27 m MV Vmean:      90.4 cm/s     Systemic Diam: 2.00 cm MV Decel Time: 491 msec MV E velocity: 90.80 cm/s MV A velocity: 121.00 cm/s MV E/A ratio:  0.75 Carlyle Dolly MD Electronically signed by Carlyle Dolly MD Signature Date/Time: 04/08/2021/4:21:45 PM    Final     Microbiology: Recent Results (from the past 240 hour(s))  Resp Panel by RT-PCR (Flu A&B, Covid) Nasopharyngeal Swab     Status: None   Collection Time: 04/07/21 11:00 AM   Specimen: Nasopharyngeal Swab; Nasopharyngeal(NP) swabs in vial transport medium  Result Value Ref Range Status   SARS Coronavirus 2 by RT PCR NEGATIVE NEGATIVE Final    Comment: (NOTE) SARS-CoV-2 target nucleic acids are NOT DETECTED.  The SARS-CoV-2 RNA is generally detectable in upper respiratory specimens during the acute phase of infection. The lowest concentration of SARS-CoV-2 viral copies this assay can detect is 138 copies/mL. A negative result does not preclude SARS-Cov-2 infection and should not be used as the sole basis for treatment or other patient management decisions. A negative result may occur with  improper specimen collection/handling, submission of specimen other than nasopharyngeal swab, presence of viral mutation(s) within the areas targeted by this assay, and inadequate number of viral copies(<138  copies/mL). A negative result must be combined with clinical observations, patient history, and epidemiological information. The expected result is Negative.  Fact Sheet for Patients:  EntrepreneurPulse.com.au  Fact Sheet for Healthcare Providers:  IncredibleEmployment.be  This test is no t yet approved or cleared by the [place] FDA and  has been authorized for detection and/or diagnosis of SARS-CoV-2 by FDA under an Emergency Use Authorization (EUA). This EUA will remain  in effect (meaning this test can be used) for the duration of the COVID-19 declaration under Section 564(b)(1) of the Act, 21 U.S.C.section  360bbb-3(b)(1), unless the authorization is terminated  or revoked sooner.       Influenza A by PCR NEGATIVE NEGATIVE Final   Influenza B by PCR NEGATIVE NEGATIVE Final    Comment: (NOTE) The Xpert Xpress SARS-CoV-2/FLU/RSV plus assay is intended as an aid in the diagnosis of influenza from Nasopharyngeal swab specimens and should not be used as a sole basis for treatment. Nasal washings and aspirates are unacceptable for Xpert Xpress SARS-CoV-2/FLU/RSV testing.  Fact Sheet for Patients: EntrepreneurPulse.com.au  Fact Sheet for Healthcare Providers: IncredibleEmployment.be  This test is not yet approved or cleared by the [place] FDA and has been authorized for detection and/or diagnosis of SARS-CoV-2 by FDA under an Emergency Use Authorization (EUA). This EUA will remain in effect (meaning this test can be used) for the duration of the COVID-19 declaration under Section 564(b)(1) of the Act, 21 U.S.C. section 360bbb-3(b)(1), unless the authorization is terminated or revoked.  Performed at [facility], [address], [place],  [address]      Labs: Basic Metabolic Panel: Recent Labs  Lab 04/07/21 1118 04/08/21 0639  NA 141 137  K 3.9 3.7  CL 107 102  CO2 22 23  GLUCOSE 129* 156*  BUN 31* 36*  CREATININE 0.93 1.12*  CALCIUM 9.3 9.1   Liver Function Tests: No results for input(s): AST, ALT, ALKPHOS, BILITOT, PROT, ALBUMIN in the last 168 hours. No results for input(s): LIPASE, AMYLASE in the last 168 hours. No results for input(s): AMMONIA in the last 168 hours. CBC: Recent Labs  Lab 04/07/21 1118  WBC 30.9*  NEUTROABS 6.2  HGB 13.7  HCT 41.0  MCV 93.2  PLT 210   Cardiac Enzymes: No results for input(s): CKTOTAL, CKMB, CKMBINDEX, TROPONINI in the last 168 hours. BNP: Invalid input(s): POCBNP CBG: No results for input(s): GLUCAP in the last 168 hours.  Time coordinating discharge:  36  minutes  Signed:  Orson Eva, DO [facility] Pager: [phone] 04/08/2021, 4:46 PM

## 2021-04-20 ENCOUNTER — Inpatient Hospital Stay (HOSPITAL_COMMUNITY): Admit: 2021-04-20 | Payer: Medicare HMO

## 2021-04-20 ENCOUNTER — Other Ambulatory Visit (HOSPITAL_COMMUNITY)
Admission: RE | Admit: 2021-04-20 | Discharge: 2021-04-20 | Disposition: A | Discharge status: Home / Self Care | Payer: Medicare HMO | Source: Ambulatory Visit | Attending: Internal Medicine | Admitting: Internal Medicine

## 2021-04-20 ENCOUNTER — Inpatient Hospital Stay (HOSPITAL_COMMUNITY): Payer: Medicare HMO | Attending: Hematology

## 2021-04-20 DIAGNOSIS — R0602 Shortness of breath: Secondary | ICD-10-CM | POA: Insufficient documentation

## 2021-04-20 DIAGNOSIS — R079 Chest pain, unspecified: Secondary | ICD-10-CM | POA: Insufficient documentation

## 2021-04-20 DIAGNOSIS — C9110 Chronic lymphocytic leukemia of B-cell type not having achieved remission: Secondary | ICD-10-CM | POA: Diagnosis not present

## 2021-04-20 DIAGNOSIS — Z79899 Other long term (current) drug therapy: Secondary | ICD-10-CM | POA: Insufficient documentation

## 2021-04-20 DIAGNOSIS — E559 Vitamin D deficiency, unspecified: Secondary | ICD-10-CM

## 2021-04-20 LAB — BASIC METABOLIC PANEL
Anion gap: 10 (ref 5–15)
BUN: 20 mg/dL (ref 8–23)
CO2: 19 mmol/L — ABNORMAL LOW (ref 22–32)
Calcium: 9.7 mg/dL (ref 8.9–10.3)
Chloride: 109 mmol/L (ref 98–111)
Creatinine, Ser: 0.9 mg/dL (ref 0.44–1.00)
GFR, Estimated: >60 mL/min (ref >60)
Glucose, Bld: 125 mg/dL — ABNORMAL HIGH (ref 70–99)
Potassium: 4.1 mmol/L (ref 3.5–5.1)
Sodium: 138 mmol/L (ref 135–145)

## 2021-04-20 LAB — CBC WITH DIFFERENTIAL/PLATELET
Basophils Absolute: 0 10*3/uL (ref 0.0–0.1)
Basophils Relative: 0 %
Eosinophils Absolute: 1 10*3/uL — ABNORMAL HIGH (ref 0.0–0.5)
Eosinophils Relative: 3 %
HCT: 39.7 % (ref 36.0–46.0)
Hemoglobin: 13.3 g/dL (ref 12.0–15.0)
Lymphocytes Relative: 80 %
Lymphs Abs: 27.8 10*3/uL — ABNORMAL HIGH (ref 0.7–4.0)
MCH: 31.4 pg (ref 26.0–34.0)
MCHC: 33.5 g/dL (ref 30.0–36.0)
MCV: 93.9 fL (ref 80.0–100.0)
Monocytes Absolute: 0.7 10*3/uL (ref 0.1–1.0)
Monocytes Relative: 2 %
Neutro Abs: 5.2 10*3/uL (ref 1.7–7.7)
Neutrophils Relative %: 15 %
Platelets: 205 10*3/uL (ref 150–400)
RBC: 4.23 MIL/uL (ref 3.87–5.11)
RDW: 14.3 % (ref 11.5–15.5)
WBC: 34.8 10*3/uL — ABNORMAL HIGH (ref 4.0–10.5)
nRBC: 0 % (ref 0.0–0.2)

## 2021-04-20 LAB — LIPID PANEL
Cholesterol: 106 mg/dL (ref 0–200)
HDL: 40 mg/dL — ABNORMAL LOW (ref >40)
LDL Cholesterol: 42 mg/dL (ref 0–99)
Total CHOL/HDL Ratio: 2.7 RATIO
Triglycerides: 118 mg/dL (ref <150)
VLDL: 24 mg/dL (ref 0–40)

## 2021-04-20 LAB — LACTATE DEHYDROGENASE: LDH: 139 U/L (ref 98–192)

## 2021-04-20 LAB — VITAMIN D 25 HYDROXY (VIT D DEFICIENCY, FRACTURES): Vit D, 25-Hydroxy: 95.53 ng/mL (ref 30–100)

## 2021-04-20 LAB — BRAIN NATRIURETIC PEPTIDE: B Natriuretic Peptide: 222 pg/mL — ABNORMAL HIGH (ref 0.0–100.0)

## 2021-04-26 NOTE — Progress Notes (Signed)
[place], [place] [id number]   CLINIC:  Medical Oncology/Hematology  PCP:  Lemmie Evens, MD [place] / [place] [address]  [phone]  REASON FOR VISIT:  Follow-up for CLL  PRIOR THERAPY: none  CURRENT THERAPY: surveillance  INTERVAL HISTORY:  [name], a 85 y.o. female, returns for routine follow-up for her CLL. Courtenay was last seen on 10/13/2020.  Today she reports feeling good. She denies fevers, night sweats, hot flashes, lumps, and weight loss. She was hospitalized on 11/16 due to A-fib.   REVIEW OF SYSTEMS:  Review of Systems  Constitutional:  Positive for fatigue (depleted). Negative for appetite change (75%), fever and unexpected weight change.  HENT:   Negative for lump/mass.   Respiratory:  Positive for cough and shortness of breath.   Cardiovascular:  Positive for chest pain (L side near breast).  Gastrointestinal:  Positive for nausea.  Endocrine: Negative for hot flashes.  Genitourinary:  Positive for frequency.   Neurological:  Positive for dizziness and headaches.  All other systems reviewed and are negative.  PAST MEDICAL/SURGICAL HISTORY:  Past Medical History:  Diagnosis Date   Bowel obstruction ([place])    Breast cancer ([place])    Breast cancer, stage 3 ([place]) 07/21/2011   Bronchitis    CAD (coronary artery disease)    a. cath in 06/2020 showing mild to moderate nonobstructive CAD   CHF (congestive heart failure) ([place])    a. EF previously 55-60% in 06/2018 b. EF reduced to 40-45% by repeat echo in 03/2019 and at 35-40% by echo in 05/2020   CLL (chronic lymphocytic leukemia) ([place]) 07/21/2011   Migraines    PAF (paroxysmal atrial fibrillation) ([facility])    Pneumonia    Past Surgical History:  Procedure Laterality Date   ABDOMINAL HYSTERECTOMY     APPENDECTOMY     CHOLECYSTECTOMY     LAPAROTOMY N/A 06/08/2015   Procedure: EXPLORATORY LAPAROTOMY;  Surgeon: Aviva Signs, MD;  Location: [facility];   Service: General;  Laterality: N/A;   LYSIS OF ADHESION N/A 06/08/2015   Procedure: LYSIS OF ADHESION;  Surgeon: Aviva Signs, MD;  Location: [facility];  Service: General;  Laterality: N/A;   MASTECTOMY     right   RIGHT/LEFT HEART CATH AND CORONARY ANGIOGRAPHY N/A 07/09/2020   Procedure: RIGHT/LEFT HEART CATH AND CORONARY ANGIOGRAPHY;  Surgeon: Nelva Bush, MD;  Location: [facility];  Service: Cardiovascular;  Laterality: N/A;   TOTAL HIP ARTHROPLASTY     right    SOCIAL HISTORY:  Social History   Socioeconomic History   Marital status: Widowed    Spouse name: Not on file   Number of children: Not on file   Years of education: 11   Highest education level: Not on file  Occupational History   Not on file  Tobacco Use   Smoking status: Former    Types: Cigarettes    Quit date: 10/26/1978    Years since quitting: 42.5   Smokeless tobacco: Never  Vaping Use   Vaping Use: Never used  Substance and Sexual Activity   Alcohol use: No   Drug use: No   Sexual activity: Not Currently  Other Topics Concern   Not on file  Social History Narrative   Pt lives by herself   Social Determinants of Health   Financial Resource Strain: Not on file  Food Insecurity: Not on file  Transportation Needs: Not on file  Physical Activity: Not  on file  Stress: Not on file  Social Connections: Not on file  Intimate Partner Violence: Not on file    FAMILY HISTORY:  Family History  Problem Relation Age of Onset   Cancer Brother    Cancer Other     CURRENT MEDICATIONS:  Current Outpatient Medications  Medication Sig Dispense Refill   bisoprolol (ZEBETA) 5 MG tablet Take 0.5 tablets (2.5 mg total) by mouth daily. 30 tablet 1   Capsaicin-Menthol (SALONPAS GEL EX) Apply topically.     diphenhydrAMINE (BENADRYL) 25 MG tablet Take 25 mg by mouth daily as needed for allergies.     ELIQUIS 5 MG TABS tablet TAKE 1 TABLET BY MOUTH 2 TIMES DAILY. 180 tablet 0   ergocalciferol (VITAMIN D2)  1.25 MG (50000 UT) capsule Take 1 capsule (50,000 Units total) by mouth every Wednesday. 12 capsule 3   losartan (COZAAR) 25 MG tablet Take 0.5 tablets (12.5 mg total) by mouth daily. 45 tablet 3   omeprazole (PRILOSEC) 20 MG capsule Take 20 mg by mouth daily.     spironolactone (ALDACTONE) 25 MG tablet Take 1 tablet (25 mg total) by mouth daily. 90 tablet 3   acetaminophen (TYLENOL) 500 MG tablet Take 1,000 mg by mouth every 6 (six) hours as needed for moderate pain or headache. (Patient not taking: Reported on 04/27/2021)     albuterol (PROVENTIL HFA;VENTOLIN HFA) 108 (90 BASE) MCG/ACT inhaler Inhale 2 puffs into the lungs every 6 (six) hours as needed for shortness of breath.  (Patient not taking: Reported on 04/27/2021)     albuterol (PROVENTIL) (2.5 MG/3ML) 0.083% nebulizer solution Take 3 mLs (2.5 mg total) by nebulization every 6 (six) hours as needed for wheezing or shortness of breath (when not relieved by inhaler.). (Patient not taking: Reported on 04/27/2021) 75 mL 12   atorvastatin (LIPITOR) 20 MG tablet Take 1 tablet (20 mg total) by mouth daily. 90 tablet 3   No current facility-administered medications for this visit.    ALLERGIES:  Allergies  Allergen Reactions   Codeine Nausea And Vomiting    headache   Losartan Nausea Only    Nauseated and headaches   Meclizine Other (See Comments)    Made dizziness worse.   Sulfa Antibiotics Nausea And Vomiting    Stomach upset   Zoloft [Sertraline Hcl] Anxiety    PHYSICAL EXAM:  Performance status (ECOG): 1 - Symptomatic but completely ambulatory  Vitals:   04/27/21 1420  BP: 127/77  Pulse: 71  Resp: 18  Temp: 98.1 F (36.7 C)  SpO2: 96%   Wt Readings from Last 3 Encounters:  04/27/21 166 lb 4.8 oz (75.4 kg)  04/08/21 166 lb 10.7 oz (75.6 kg)  03/29/21 170 lb (77.1 kg)   Physical Exam Vitals reviewed.  Constitutional:      Appearance: Normal appearance.  Cardiovascular:     Rate and Rhythm: Normal rate and regular  rhythm.     Pulses: Normal pulses.     Heart sounds: Normal heart sounds.  Pulmonary:     Effort: Pulmonary effort is normal.     Breath sounds: Normal breath sounds.  Chest:  Breasts:    Right: Absent. No mass, skin change (mastectomy site within normal limits) or tenderness.     Left: Tenderness (UIQ) present. No mass or skin change.  Abdominal:     Palpations: Abdomen is soft. There is no hepatomegaly, splenomegaly or mass.     Tenderness: There is no abdominal tenderness.  Lymphadenopathy:  Cervical: No cervical adenopathy.     Right cervical: No superficial, deep or posterior cervical adenopathy.    Left cervical: No superficial, deep or posterior cervical adenopathy.     Upper Body:     Right upper body: No supraclavicular, axillary or pectoral adenopathy.     Left upper body: No supraclavicular, axillary or pectoral adenopathy.     Lower Body: No right inguinal adenopathy. No left inguinal adenopathy.  Neurological:     General: No focal deficit present.     Mental Status: She is alert and oriented to person, place, and time.  Psychiatric:        Mood and Affect: Mood normal.        Behavior: Behavior normal.    LABORATORY DATA:  I have reviewed the labs as listed.  CBC Latest Ref Rng & Units 04/20/2021 04/07/2021 10/07/2020  WBC 4.0 - 10.5 K/uL 34.8(H) 30.9(H) 36.2(H)  Hemoglobin 12.0 - 15.0 g/dL 13.3 13.7 12.7  Hematocrit 36.0 - 46.0 % 39.7 41.0 39.7  Platelets 150 - 400 K/uL 205 210 191   CMP Latest Ref Rng & Units 04/20/2021 04/08/2021 04/07/2021  Glucose 70 - 99 mg/dL 125(H) 156(H) 129(H)  BUN 8 - 23 mg/dL 20 36(H) 31(H)  Creatinine 0.44 - 1.00 mg/dL 0.90 1.12(H) 0.93  Sodium 135 - 145 mmol/L 138 137 141  Potassium 3.5 - 5.1 mmol/L 4.1 3.7 3.9  Chloride 98 - 111 mmol/L 109 102 107  CO2 22 - 32 mmol/L 19(L) 23 22  Calcium 8.9 - 10.3 mg/dL 9.7 9.1 9.3  Total Protein 6.5 - 8.1 g/dL - - -  Total Bilirubin 0.3 - 1.2 mg/dL - - -  Alkaline Phos 38 - 126 U/L - -  -  AST 15 - 41 U/L - - -  ALT 0 - 44 U/L - - -      Component Value Date/Time   RBC 4.23 04/20/2021 1311   MCV 93.9 04/20/2021 1311   MCV 88.9 10/12/2012 1038   MCH 31.4 04/20/2021 1311   MCHC 33.5 04/20/2021 1311   RDW 14.3 04/20/2021 1311   RDW 14.4 10/12/2012 1038   LYMPHSABS 27.8 (H) 04/20/2021 1311   LYMPHSABS 57.9 (H) 10/12/2012 1038   MONOABS 0.7 04/20/2021 1311   MONOABS 1.2 (H) 10/12/2012 1038   EOSABS 1.0 (H) 04/20/2021 1311   EOSABS 0.4 10/12/2012 1038   BASOSABS 0.0 04/20/2021 1311   BASOSABS 0.3 (H) 10/12/2012 1038    DIAGNOSTIC IMAGING:  I have independently reviewed the scans and discussed with the patient. CT Angio Chest PE W and/or Wo Contrast  Result Date: 04/07/2021 CLINICAL DATA:  SOB chronic worse since yesterday, Cough. Pt has hx of COPd. Pt took @ home breathing tx last night w/ relief. EXAM: CT ANGIOGRAPHY CHEST WITH CONTRAST TECHNIQUE: Multidetector CT imaging of the chest was performed using the standard protocol during bolus administration of intravenous contrast. Multiplanar CT image reconstructions and MIPs were obtained to evaluate the vascular anatomy. CONTRAST:  38mL OMNIPAQUE IOHEXOL 350 MG/ML SOLN COMPARISON:  04/23/2019.  Current chest radiograph. FINDINGS: Cardiovascular: Pulmonary arteries are well opacified. There is no evidence of a pulmonary embolism. Heart mildly enlarged. Three-vessel coronary artery calcifications. Great vessels are normal in caliber. No aortic dissection. Mild aortic atherosclerosis. Mediastinum/Nodes: No mediastinal or hilar masses or enlarged lymph nodes. Trachea and esophagus are unremarkable. Lungs/Pleura: Peripheral areas of interstitial thickening/reticulation, chronic and unchanged. No lung consolidation. No evidence of pulmonary edema. No mass or suspicious nodule. No pleural effusion  or pneumothorax. Upper Abdomen: No acute abnormality. Musculoskeletal: Mild stable wedge-shaped compression deformity of T6. Slight  depression of the upper endplate of P50, also unchanged. No acute fracture. No bone lesions. Review of the MIP images confirms the above findings. IMPRESSION: 1. No evidence of a pulmonary embolism. 2. No acute findings. 3. Three-vessel coronary artery calcifications and mild aortic atherosclerosis. Aortic Atherosclerosis (ICD10-I70.0). Electronically Signed   By: Lajean Manes M.D.   On: 04/07/2021 16:17   DG Chest Port 1 View  Result Date: 04/07/2021 CLINICAL DATA:  Shortness of breath with exertion.  Cough. EXAM: PORTABLE CHEST 1 VIEW COMPARISON:  03/25/2019 FINDINGS: There is cardiomegaly and aortic atherosclerosis. The upper lungs are clear. Minimal atelectasis or scarring at the lung bases. No visible effusion. No acute bone finding. IMPRESSION: Cardiomegaly and aortic atherosclerosis. Minimal atelectasis or scarring at the lung bases. Electronically Signed   By: Nelson Chimes M.D.   On: 04/07/2021 11:15   ECHOCARDIOGRAM COMPLETE  Result Date: 04/08/2021    ECHOCARDIOGRAM REPORT   Patient Name:   [name] Date of Exam: 04/08/2021 Medical Rec #:  [id number]         Height:       61.0 in Accession #:    [id number]        Weight:       166.7 lb Date of Birth:  Mar 24, 1936         BSA:          1.748 m Patient Age:    29 years          BP:           111/60 mmHg Patient Gender: F                 HR:           55 bpm. Exam Location:  [facility] Procedure: 2D Echo, Cardiac Doppler and Color Doppler Indications:    CHF-Acute Systolic  History:        Patient has prior history of Echocardiogram examinations, most                 recent 12/21/2020. CHF, COPD, Arrythmias:Atrial Fibrillation,                 Signs/Symptoms:Dyspnea; Risk Factors:Dyslipidemia. Breast CA.  Sonographer:    Wenda Low Referring Phys: [phone] DAVID TAT IMPRESSIONS  1. Left ventricular ejection fraction, by estimation, is 35 to 40%. The left ventricle has moderately decreased function. The left ventricle demonstrates regional  wall motion abnormalities (see scoring diagram/findings for description). There is moderate left ventricular hypertrophy. Left ventricular diastolic parameters are consistent with Grade I diastolic dysfunction (impaired relaxation). Elevated left atrial pressure.  2. Right ventricular systolic function is normal. The right ventricular size is normal. There is mildly elevated pulmonary artery systolic pressure.  3. Left atrial size was moderately dilated.  4. Right atrial size was mild to moderately dilated.  5. The mitral valve is abnormal. Mild mitral valve regurgitation. Mild mitral stenosis. Moderate mitral annular calcification.  6. The aortic valve is tricuspid. There is moderate calcification of the aortic valve. There is moderate thickening of the aortic valve. Aortic valve regurgitation is not visualized. Mild aortic valve stenosis. FINDINGS  Left Ventricle: The anteroseptum, anterior wall, and anterolateral walls are hypokinetic. Left ventricular ejection fraction, by estimation, is 35 to 40%. The left ventricle has moderately decreased function. The left ventricle demonstrates regional wall motion abnormalities. The left ventricular internal cavity  size was normal in size. There is moderate left ventricular hypertrophy. Left ventricular diastolic parameters are consistent with Grade I diastolic dysfunction (impaired relaxation). Elevated left atrial pressure. Right Ventricle: The right ventricular size is normal. No increase in right ventricular wall thickness. Right ventricular systolic function is normal. There is mildly elevated pulmonary artery systolic pressure. The tricuspid regurgitant velocity is 2.66  m/s, and with an assumed right atrial pressure of 8 mmHg, the estimated right ventricular systolic pressure is 67.8 mmHg. Left Atrium: Left atrial size was moderately dilated. Right Atrium: Right atrial size was mild to moderately dilated. Pericardium: There is no evidence of pericardial effusion.  Mitral Valve: The mitral valve is abnormal. There is moderate thickening of the mitral valve leaflet(s). There is moderate calcification of the mitral valve leaflet(s). Moderate mitral annular calcification. Mild mitral valve regurgitation. Mild mitral valve stenosis. MV peak gradient, 9.5 mmHg. The mean mitral valve gradient is 4.0 mmHg. Tricuspid Valve: The tricuspid valve is normal in structure. Tricuspid valve regurgitation is mild . No evidence of tricuspid stenosis. Aortic Valve: The aortic valve is tricuspid. There is moderate calcification of the aortic valve. There is moderate thickening of the aortic valve. There is moderate aortic valve annular calcification. Aortic valve regurgitation is not visualized. Mild aortic stenosis is present. Aortic valve mean gradient measures 10.0 mmHg. Aortic valve peak gradient measures 20.8 mmHg. Aortic valve area, by VTI measures 1.59 cm. Pulmonic Valve: The pulmonic valve was not well visualized. Pulmonic valve regurgitation is not visualized. No evidence of pulmonic stenosis. Aorta: The aortic root is normal in size and structure. IAS/Shunts: No atrial level shunt detected by color flow Doppler.  LEFT VENTRICLE PLAX 2D LVIDd:         5.00 cm      Diastology LVIDs:         4.00 cm      LV e' medial:    4.14 cm/s LV PW:         1.40 cm      LV E/e' medial:  21.9 LV IVS:        1.40 cm      LV e' lateral:   5.68 cm/s LVOT diam:     2.00 cm      LV E/e' lateral: 16.0 LV SV:         86 LV SV Index:   49 LVOT Area:     3.14 cm  LV Volumes (MOD) LV vol d, MOD A2C: 97.0 ml LV vol d, MOD A4C: 119.0 ml LV vol s, MOD A2C: 66.3 ml LV vol s, MOD A4C: 48.1 ml LV SV MOD A2C:     30.7 ml LV SV MOD A4C:     119.0 ml LV SV MOD BP:      50.0 ml RIGHT VENTRICLE RV Basal diam:  4.00 cm RV Mid diam:    3.00 cm RV S prime:     9.39 cm/s TAPSE (M-mode): 2.6 cm LEFT ATRIUM             Index        RIGHT ATRIUM           Index LA diam:        4.30 cm 2.46 cm/m   RA Area:     23.50 cm LA Vol  (A2C):   85.0 ml 48.63 ml/m  RA Volume:   74.50 ml  42.62 ml/m LA Vol (A4C):   84.3 ml 48.23 ml/m LA Biplane Vol: 91.5  ml 52.35 ml/m  AORTIC VALVE                     PULMONIC VALVE AV Area (Vmax):    1.64 cm      PV Vmax:       0.74 m/s AV Area (Vmean):   1.62 cm      PV Peak grad:  2.2 mmHg AV Area (VTI):     1.59 cm AV Vmax:           228.00 cm/s AV Vmean:          146.000 cm/s AV VTI:            0.541 m AV Peak Grad:      20.8 mmHg AV Mean Grad:      10.0 mmHg LVOT Vmax:         119.00 cm/s LVOT Vmean:        75.300 cm/s LVOT VTI:          0.273 m LVOT/AV VTI ratio: 0.50  AORTA Ao Root diam: 2.65 cm Ao Asc diam:  3.50 cm MITRAL VALVE                TRICUSPID VALVE MV Area (PHT): 1.55 cm     TR Peak grad:   28.3 mmHg MV Area VTI:   1.81 cm     TR Vmax:        266.00 cm/s MV Peak grad:  9.5 mmHg MV Mean grad:  4.0 mmHg     SHUNTS MV Vmax:       1.54 m/s     Systemic VTI:  0.27 m MV Vmean:      90.4 cm/s    Systemic Diam: 2.00 cm MV Decel Time: 491 msec MV E velocity: 90.80 cm/s MV A velocity: 121.00 cm/s MV E/A ratio:  0.75 Carlyle Dolly MD Electronically signed by Carlyle Dolly MD Signature Date/Time: 04/08/2021/4:21:45 PM    Final      ASSESSMENT:  1.  CLL: -Patient was diagnosed 25+ years ago.  Has never required treatment. -Patient reports ongoing hot flashes and night sweats.  She denies any adenopathy.  No other B symptoms. -On 03/26/2019 patient had a CT scan of her chest which revealed a 17 mm irregular soft tissue density along with the medial aspect of the left lung base. -CT of chest on 04/23/2019 showed interval resolution of previously demonstrated small pleural effusions.  No suspicious pulmonary nodules.     2.  Stage III right breast cancer: -Patient was diagnosed in 1980 /1999. -Treated with neoadjuvant chemotherapy followed by right mastectomy then XRT.   3.  Atrial fibrillation: -Recently referred to atrial fibrillation clinic secondary to palpitations mainly at night  time. -Her bisoprolol was increased from 5 mg to 7.5 mg daily.    PLAN:  1.  CLL: - She had recent hospitalization due to atrial fibrillation. - She does not report any fevers, night sweats or weight loss. - Physical examination today did not reveal any palpable adenopathy or splenomegaly. - Labs reviewed by me shows white count 34.8 with normal hemoglobin and platelet count.  Differential shows predominantly lymphocytosis.  LDH was normal. - RTC 6 months for follow-up with repeat labs and physical exam.     2.  Stage III right breast cancer: - Right mastectomy site is within normal limits.  Left breast has no palpable masses.  There is tenderness in the medial upper inner breast with no palpable  mass.  No palpable adenopathy. - She will have her mammogram done on 05/14/2021.     3.   Vitamin D deficiency: - Vitamin D level is 95. - I have told her to discontinue prescription weekly vitamin D.  She was told to start on vitamin D 5000 units daily.  Orders placed this encounter:  No orders of the defined types were placed in this encounter.    Derek Jack, MD [place] [phone]   I, Thana Ates, am acting as a scribe for Dr. Derek Jack.  I, Derek Jack MD, have reviewed the above documentation for accuracy and completeness, and I agree with the above.

## 2021-04-27 ENCOUNTER — Inpatient Hospital Stay (HOSPITAL_COMMUNITY): Payer: Medicare HMO | Attending: Hematology | Admitting: Hematology

## 2021-04-27 ENCOUNTER — Other Ambulatory Visit: Payer: Self-pay

## 2021-04-27 VITALS — BP 127/77 | HR 71 | Temp 98.1°F | Resp 18 | Wt 166.3 lb

## 2021-04-27 DIAGNOSIS — E559 Vitamin D deficiency, unspecified: Secondary | ICD-10-CM | POA: Insufficient documentation

## 2021-04-27 DIAGNOSIS — C9110 Chronic lymphocytic leukemia of B-cell type not having achieved remission: Secondary | ICD-10-CM | POA: Insufficient documentation

## 2021-04-27 DIAGNOSIS — N951 Menopausal and female climacteric states: Secondary | ICD-10-CM | POA: Insufficient documentation

## 2021-04-27 DIAGNOSIS — Z853 Personal history of malignant neoplasm of breast: Secondary | ICD-10-CM | POA: Diagnosis not present

## 2021-04-27 DIAGNOSIS — I4891 Unspecified atrial fibrillation: Secondary | ICD-10-CM | POA: Diagnosis not present

## 2021-04-27 DIAGNOSIS — R61 Generalized hyperhidrosis: Secondary | ICD-10-CM | POA: Insufficient documentation

## 2021-04-27 NOTE — Patient Instructions (Signed)
[place] at [facility] Discharge Instructions  You were seen and examined today by Dr. Delton Coombes. He reviewed your most recent labs and everything looks okay. He wants you to stop taking the Vitamin D2 prescription once weekly and pick up over the counter Vitamin D2 5000 units to take once daily. Have your mammogram done. Please follow up as scheduled.   Thank you for choosing [place] at [facility] to provide your oncology and hematology care.  To afford each patient quality time with our provider, please arrive at least 15 minutes before your scheduled appointment time.   If you have a lab appointment with the [facility] please come in thru the Main Entrance and check in at the main information desk.  You need to re-schedule your appointment should you arrive 10 or more minutes late.  We strive to give you quality time with our providers, and arriving late affects you and other patients whose appointments are after yours.  Also, if you no show three or more times for appointments you may be dismissed from the clinic at the providers discretion.     Again, thank you for choosing [facility].  Our hope is that these requests will decrease the amount of time that you wait before being seen by our physicians.       _____________________________________________________________  Should you have questions after your visit to [facility], please contact our office at [phone] and follow the prompts.  Our office hours are 8:00 a.m. and 4:30 p.m. Monday - Friday.  Please note that voicemails left after 4:00 p.m. may not be returned until the following business day.  We are closed weekends and major holidays.  You do have access to a nurse 24-7, just call the main number to the clinic [phone] and do not press any options, hold on the line and a nurse will answer the phone.    For prescription refill requests, have  your pharmacy contact our office and allow 72 hours.    Due to Covid, you will need to wear a mask upon entering the hospital. If you do not have a mask, a mask will be given to you at the Main Entrance upon arrival. For doctor visits, patients may have 1 support person age 46 or older with them. For treatment visits, patients can not have anyone with them due to social distancing guidelines and our immunocompromised population.

## 2021-05-13 ENCOUNTER — Ambulatory Visit (INDEPENDENT_AMBULATORY_CARE_PROVIDER_SITE_OTHER): Payer: Medicare HMO | Admitting: Student

## 2021-05-13 ENCOUNTER — Encounter: Payer: Self-pay | Admitting: Student

## 2021-05-13 ENCOUNTER — Encounter (HOSPITAL_COMMUNITY): Payer: Self-pay | Admitting: Emergency Medicine

## 2021-05-13 ENCOUNTER — Emergency Department (HOSPITAL_COMMUNITY)
Admission: EM | Admit: 2021-05-13 | Discharge: 2021-05-13 | Disposition: A | Discharge status: Home / Self Care | Payer: Medicare HMO | Attending: Emergency Medicine | Admitting: Emergency Medicine

## 2021-05-13 ENCOUNTER — Other Ambulatory Visit: Payer: Self-pay

## 2021-05-13 VITALS — BP 102/64 | HR 101 | Ht 61.0 in | Wt 160.0 lb

## 2021-05-13 DIAGNOSIS — J441 Chronic obstructive pulmonary disease with (acute) exacerbation: Secondary | ICD-10-CM | POA: Diagnosis not present

## 2021-05-13 DIAGNOSIS — Z87891 Personal history of nicotine dependence: Secondary | ICD-10-CM | POA: Insufficient documentation

## 2021-05-13 DIAGNOSIS — I480 Paroxysmal atrial fibrillation: Secondary | ICD-10-CM | POA: Diagnosis not present

## 2021-05-13 DIAGNOSIS — I2510 Atherosclerotic heart disease of native coronary artery without angina pectoris: Secondary | ICD-10-CM | POA: Diagnosis not present

## 2021-05-13 DIAGNOSIS — I5042 Chronic combined systolic (congestive) and diastolic (congestive) heart failure: Secondary | ICD-10-CM | POA: Diagnosis not present

## 2021-05-13 DIAGNOSIS — Z955 Presence of coronary angioplasty implant and graft: Secondary | ICD-10-CM | POA: Diagnosis not present

## 2021-05-13 DIAGNOSIS — Z96641 Presence of right artificial hip joint: Secondary | ICD-10-CM | POA: Insufficient documentation

## 2021-05-13 DIAGNOSIS — C9110 Chronic lymphocytic leukemia of B-cell type not having achieved remission: Secondary | ICD-10-CM | POA: Diagnosis not present

## 2021-05-13 DIAGNOSIS — Z79899 Other long term (current) drug therapy: Secondary | ICD-10-CM | POA: Diagnosis not present

## 2021-05-13 DIAGNOSIS — R1111 Vomiting without nausea: Secondary | ICD-10-CM

## 2021-05-13 DIAGNOSIS — R112 Nausea with vomiting, unspecified: Secondary | ICD-10-CM | POA: Diagnosis not present

## 2021-05-13 DIAGNOSIS — I4891 Unspecified atrial fibrillation: Secondary | ICD-10-CM | POA: Diagnosis not present

## 2021-05-13 DIAGNOSIS — Z853 Personal history of malignant neoplasm of breast: Secondary | ICD-10-CM | POA: Diagnosis not present

## 2021-05-13 DIAGNOSIS — I5043 Acute on chronic combined systolic (congestive) and diastolic (congestive) heart failure: Secondary | ICD-10-CM | POA: Diagnosis not present

## 2021-05-13 DIAGNOSIS — I350 Nonrheumatic aortic (valve) stenosis: Secondary | ICD-10-CM | POA: Diagnosis not present

## 2021-05-13 DIAGNOSIS — R1110 Vomiting, unspecified: Secondary | ICD-10-CM | POA: Diagnosis not present

## 2021-05-13 DIAGNOSIS — I110 Hypertensive heart disease with heart failure: Secondary | ICD-10-CM | POA: Diagnosis not present

## 2021-05-13 DIAGNOSIS — Z7901 Long term (current) use of anticoagulants: Secondary | ICD-10-CM | POA: Diagnosis not present

## 2021-05-13 DIAGNOSIS — R9431 Abnormal electrocardiogram [ECG] [EKG]: Secondary | ICD-10-CM | POA: Diagnosis not present

## 2021-05-13 LAB — CBC WITH DIFFERENTIAL/PLATELET
Band Neutrophils: 3 %
Basophils Absolute: 0.3 10*3/uL — ABNORMAL HIGH (ref 0.0–0.1)
Basophils Relative: 1 %
Eosinophils Absolute: 0 10*3/uL (ref 0.0–0.5)
Eosinophils Relative: 0 %
HCT: 42 % (ref 36.0–46.0)
Hemoglobin: 13.6 g/dL (ref 12.0–15.0)
Lymphocytes Relative: 80 %
Lymphs Abs: 23.2 10*3/uL — ABNORMAL HIGH (ref 0.7–4.0)
MCH: 31.2 pg (ref 26.0–34.0)
MCHC: 32.4 g/dL (ref 30.0–36.0)
MCV: 96.3 fL (ref 80.0–100.0)
Monocytes Absolute: 0.6 10*3/uL (ref 0.1–1.0)
Monocytes Relative: 2 %
Neutro Abs: 4.9 10*3/uL (ref 1.7–7.7)
Neutrophils Relative %: 14 %
Platelets: 177 10*3/uL (ref 150–400)
RBC: 4.36 MIL/uL (ref 3.87–5.11)
RDW: 14 % (ref 11.5–15.5)
WBC: 29 10*3/uL — ABNORMAL HIGH (ref 4.0–10.5)
nRBC: 0 % (ref 0.0–0.2)

## 2021-05-13 LAB — BASIC METABOLIC PANEL
Anion gap: 14 (ref 5–15)
BUN: 26 mg/dL — ABNORMAL HIGH (ref 8–23)
CO2: 23 mmol/L (ref 22–32)
Calcium: 9.2 mg/dL (ref 8.9–10.3)
Chloride: 104 mmol/L (ref 98–111)
Creatinine, Ser: 1.03 mg/dL — ABNORMAL HIGH (ref 0.44–1.00)
GFR, Estimated: 54 mL/min — ABNORMAL LOW (ref >60)
Glucose, Bld: 128 mg/dL — ABNORMAL HIGH (ref 70–99)
Potassium: 3.9 mmol/L (ref 3.5–5.1)
Sodium: 141 mmol/L (ref 135–145)

## 2021-05-13 LAB — BRAIN NATRIURETIC PEPTIDE: B Natriuretic Peptide: 339 pg/mL — ABNORMAL HIGH (ref 0.0–100.0)

## 2021-05-13 MED ORDER — ONDANSETRON HCL 4 MG PO TABS: 4.0000 mg | ORAL_TABLET | Freq: Three times a day (TID) | ORAL | 0 refills | Status: DC | PRN

## 2021-05-13 NOTE — ED Triage Notes (Signed)
Pt to the ED from home and being seen at heart care for a normal check up when she began vomiting bile.  Pt states she had not been eating regularly.  She was in Afib with history of the same.

## 2021-05-13 NOTE — ED Notes (Signed)
Gave pt a diet ginger ale and graham crackers per the RN.

## 2021-05-13 NOTE — Progress Notes (Signed)
Cardiology Office Note    Date:  05/13/2021   ID:  [name] BEGIN, DOB 1935-09-13, MRN [id number]  PCP:  Lemmie Evens, MD  Cardiologist: Dorris Carnes, MD    Chief Complaint  Patient presents with   Hospitalization Follow-up    History of Present Illness:    [name] is a 85 y.o. female with past medical history of paroxysmal atrial fibrillation, HFrEF/NICM (EF previously 55-60% in 06/2018, reduced to 40-45% by repeat echo in 03/2019, 35-40% by echo in 05/2020 and 12/2020), CAD (cath in 06/2020 showing mild to moderate nonobstructive CAD), aortic stenosis, LBBB, CLL and COPD who presents to the office today for hospital follow-up.  She was examined by Dr. Harrington Challenger in 03/2021 and reported having stopped Lisinopril because it caused headaches and had also stopped Jardiance due to having PND with the medication. It was recommended she try taking Losartan 12.5 mg daily. She was admitted to [facility] later that month for evaluation of worsening dyspnea and was admitted for management of acute hypoxic respiratory failure in the setting of likely CHF exacerbation. Her BNP was elevated to 512 on admission. She had self-discontinued Losartan prior to admission but was open to restarting this at 12.5 mg daily and it was also discussed to consider [place] instead of Jardiance she since she had experienced PND with Jardiance in the past. She did have brief episodes of atrial fibrillation during admission but no persistent arrhythmias and it was recommended that she may benefit from antiarrhythmic therapy as an outpatient if her rhythm burden increased.  In talking with the patient today, she reports acutely worsening nausea, vomiting and abdominal pain starting this morning. Says she consumed supper yesterday but awoke this morning and was severely nauseated. Says she did consume a piece of cheese this morning with her medications with but has not had regular food throughout the day.  Does report  drinking water. Says she typically has nausea in the morning hours but her symptoms have been more severe today and she was actively vomiting in her office during her visit. Reports associated generalized abdominal discomfort. No recent chest pain or palpitations. No specific orthopnea, PND or pitting edema. She has been tolerating her Losartan well since this was added during her recent admission.  Past Medical History:  Diagnosis Date   Bowel obstruction ([place])    Breast cancer ([place])    Breast cancer, stage 3 ([place]) 07/21/2011   Bronchitis    CAD (coronary artery disease)    a. cath in 06/2020 showing mild to moderate nonobstructive CAD   CHF (congestive heart failure) ([place])    a. EF previously 55-60% in 06/2018 b. EF reduced to 40-45% by repeat echo in 03/2019 and at 35-40% by echo in 05/2020   CLL (chronic lymphocytic leukemia) ([place]) 07/21/2011   Migraines    PAF (paroxysmal atrial fibrillation) ([facility])    Pneumonia     Past Surgical History:  Procedure Laterality Date   ABDOMINAL HYSTERECTOMY     APPENDECTOMY     CHOLECYSTECTOMY     LAPAROTOMY N/A 06/08/2015   Procedure: EXPLORATORY LAPAROTOMY;  Surgeon: Aviva Signs, MD;  Location: [facility];  Service: General;  Laterality: N/A;   LYSIS OF ADHESION N/A 06/08/2015   Procedure: LYSIS OF ADHESION;  Surgeon: Aviva Signs, MD;  Location: [facility];  Service: General;  Laterality: N/A;   MASTECTOMY     right   RIGHT/LEFT HEART CATH AND CORONARY ANGIOGRAPHY N/A 07/09/2020   Procedure: RIGHT/LEFT HEART CATH  AND CORONARY ANGIOGRAPHY;  Surgeon: Nelva Bush, MD;  Location: [facility];  Service: Cardiovascular;  Laterality: N/A;   TOTAL HIP ARTHROPLASTY     right    Current Medications: Outpatient Medications Prior to Visit  Medication Sig Dispense Refill   acetaminophen (TYLENOL) 500 MG tablet Take 1,000 mg by mouth every 6 (six) hours as needed for moderate pain or headache.     albuterol (PROVENTIL HFA;VENTOLIN HFA) 108 (90 BASE)  MCG/ACT inhaler Inhale 2 puffs into the lungs every 6 (six) hours as needed for shortness of breath.     albuterol (PROVENTIL) (2.5 MG/3ML) 0.083% nebulizer solution Take 3 mLs (2.5 mg total) by nebulization every 6 (six) hours as needed for wheezing or shortness of breath (when not relieved by inhaler.). 75 mL 12   atorvastatin (LIPITOR) 20 MG tablet Take 1 tablet (20 mg total) by mouth daily. 90 tablet 3   bisoprolol (ZEBETA) 5 MG tablet Take 0.5 tablets (2.5 mg total) by mouth daily. 30 tablet 1   Capsaicin-Menthol (SALONPAS GEL EX) Apply topically.     diphenhydrAMINE (BENADRYL) 25 MG tablet Take 25 mg by mouth daily as needed for allergies.     ELIQUIS 5 MG TABS tablet TAKE 1 TABLET BY MOUTH 2 TIMES DAILY. 180 tablet 0   ergocalciferol (VITAMIN D2) 1.25 MG (50000 UT) capsule Take 1 capsule (50,000 Units total) by mouth every Wednesday. 12 capsule 3   losartan (COZAAR) 25 MG tablet Take 0.5 tablets (12.5 mg total) by mouth daily. 45 tablet 3   omeprazole (PRILOSEC) 20 MG capsule Take 20 mg by mouth daily.     spironolactone (ALDACTONE) 25 MG tablet Take 1 tablet (25 mg total) by mouth daily. 90 tablet 3   No facility-administered medications prior to visit.     Allergies:   Codeine, Losartan, Meclizine, Sulfa antibiotics, and Zoloft [sertraline hcl]   Social History   Socioeconomic History   Marital status: Widowed    Spouse name: Not on file   Number of children: Not on file   Years of education: 11   Highest education level: Not on file  Occupational History   Not on file  Tobacco Use   Smoking status: Former    Types: Cigarettes    Quit date: 10/26/1978    Years since quitting: 42.5   Smokeless tobacco: Never  Vaping Use   Vaping Use: Never used  Substance and Sexual Activity   Alcohol use: No   Drug use: No   Sexual activity: Not Currently  Other Topics Concern   Not on file  Social History Narrative   Pt lives by herself   Social Determinants of Health   Financial  Resource Strain: Not on file  Food Insecurity: Not on file  Transportation Needs: Not on file  Physical Activity: Not on file  Stress: Not on file  Social Connections: Not on file     Family History:  The patient's family history includes Cancer in her brother and another family member.   Review of Systems:    Please see the history of present illness.     All other systems reviewed and are otherwise negative except as noted above.   Physical Exam:    VS:  BP 102/64    Pulse (!) 101    Ht 5\' 1"  (1.549 m)    Wt 160 lb (72.6 kg)    SpO2 96%    BMI 30.23 kg/m    General: Pleasant elderly female appearing  uncomfortable.  Head: Normocephalic, atraumatic. Neck: No carotid bruits. JVD not elevated.  Lungs: Respirations regular and unlabored, without wheezes or rales.  Heart: Irregularly irregular. No S3 or S4. 2/6 SEM along RUSB.  Abdomen: Appears non-distended. No obvious abdominal masses. Msk:  Strength and tone appear normal for age. No obvious joint deformities or effusions. Extremities: No clubbing or cyanosis. Trace lower extremity edema.  Distal pedal pulses are 2+ bilaterally. Neuro: Alert and oriented X 3. Moves all extremities spontaneously. No focal deficits noted. Psych:  Responds to questions appropriately with a normal affect. Skin: No rashes or lesions noted  Wt Readings from Last 3 Encounters:  05/13/21 160 lb (72.6 kg)  04/27/21 166 lb 4.8 oz (75.4 kg)  04/08/21 166 lb 10.7 oz (75.6 kg)     Studies/Labs Reviewed:   EKG:  EKG is ordered today.  The ekg ordered today demonstrates atrial fibrillation, heart rate 101 with known LBBB.  Recent Labs: 10/07/2020: ALT 17 04/20/2021: B Natriuretic Peptide 222.0; BUN 20; Creatinine, Ser 0.90; Hemoglobin 13.3; Platelets 205; Potassium 4.1; Sodium 138   Lipid Panel    Component Value Date/Time   CHOL 106 04/20/2021 1330   TRIG 118 04/20/2021 1330   HDL 40 (L) 04/20/2021 1330   CHOLHDL 2.7 04/20/2021 1330   VLDL 24  04/20/2021 1330   LDLCALC 42 04/20/2021 1330    Additional studies/ records that were reviewed today include:   R/LHC: 06/2020 Conclusions: Mild to moderate, non-obstructive coronary artery disease with 30-40% mid LAD stenosis and mild luminal irregularities involving the LCx and RCA. Severely reduced left ventricular systolic function (LVEF 16-10%). Upper normal left heart, right heart, and pulmonary artery pressures. Normal Fick cardiac output/index. Mild aortic stenosis.   Recommendations: Medical therapy and risk factor modification to prevent progression of coronary artery disease. Escalate goal directed medical therapy for treatment of nonischemic cardiomyopathy, as tolerated. Restart apixaban tomorrow morning if no evidence of bleeding/vascular complication.  Echocardiogram: 03/2021 IMPRESSIONS     1. Left ventricular ejection fraction, by estimation, is 35 to 40%. The  left ventricle has moderately decreased function. The left ventricle  demonstrates regional wall motion abnormalities (see scoring  diagram/findings for description). There is  moderate left ventricular hypertrophy. Left ventricular diastolic  parameters are consistent with Grade I diastolic dysfunction (impaired  relaxation). Elevated left atrial pressure.   2. Right ventricular systolic function is normal. The right ventricular  size is normal. There is mildly elevated pulmonary artery systolic  pressure.   3. Left atrial size was moderately dilated.   4. Right atrial size was mild to moderately dilated.   5. The mitral valve is abnormal. Mild mitral valve regurgitation. Mild  mitral stenosis. Moderate mitral annular calcification.   6. The aortic valve is tricuspid. There is moderate calcification of the  aortic valve. There is moderate thickening of the aortic valve. Aortic  valve regurgitation is not visualized. Mild aortic valve stenosis.   Assessment:    1. Nausea and vomiting, unspecified  vomiting type   2. PAF (paroxysmal atrial fibrillation) ([place])   3. Chronic combined systolic and diastolic heart failure ([place])   4. Non-obstructive Coronary artery disease involving native coronary artery of native heart without angina pectoris   5. Aortic valve stenosis, etiology of cardiac valve disease unspecified   6. CLL (chronic lymphocytic leukemia) ([place])      Plan:   In order of problems listed above:  1. Nausea &Vomiting/Abdominal Pain - She was actively vomiting upon arrival to the office  but symptoms did improve during the encounter but represented later in the visit. She reports this has been occurring intermittently throughout the day and reports associated abdominal pain. Glucose was checked and at 143. Her EKG did show atrial fibrillation but heart rate was in the low 100's. - Reviewed options with the patient and she will proceed to the ED for further evaluation.  2. Paroxysmal Atrial Fibrillation - She denies any palpitations today and heart rate is in the 90's to low 100's during her visit. Continue Bisoprolol 2.5 mg daily for rate control along with Eliquis 5 mg twice daily for anticoagulation. - Previous notes mentioned considering antiarrhythmic therapy if she continued to have recurrent atrial fibrillation and I reviewed this with the patient today and she wishes to hold off on medication adjustments at this time. Would readdress at future visits if she continues to have intermittent episodes of atrial fibrillation but it is possible that her current episode is driven by her GI symptoms.  3. HFrEF/NICM - She has a known reduced EF which was at 35 to 40% by most recent imaging. She denies any specific dyspnea on exertion, orthopnea or PND and does not appear volume overloaded by examination today.   - She remains on Bisoprolol 2.5 mg daily, Losartan 12.5 mg daily and Spironolactone 25 mg daily. She was previously intolerant to SGLT2 inhibitor therapy and BP has not allowed  for switching to [facility].    4. CAD - She had mild to moderate nonobstructive disease by recent catheterization in 06/2020. No reports of recent chest pain or dyspnea on exertion. Continue Bisoprolol 2.5 mg daily and Atorvastatin 20 mg daily. She is not on ASA given the need for anticoagulation.    5. Aortic Stenosis - This was mild by echocardiogram in 03/2021. Will continue to follow over time.  6. CLL - She is followed by Oncology. Continues to have ongoing hot flashes and night sweats which have been occurring for several years per report.   Medication Adjustments/Labs and Tests Ordered: Current medicines are reviewed at length with the patient today.  Concerns regarding medicines are outlined above.  Medication changes, Labs and Tests ordered today are listed in the Patient Instructions below. Patient Instructions  Medication Instructions:  Your physician recommends that you continue on your current medications as directed. Please refer to the Current Medication list given to you today.   Labwork: None   Testing/Procedures: None   Follow-Up: After ED visit  Any Other Special Instructions Will Be Listed Below (If Applicable).  If you need a refill on your cardiac medications before your next appointment, please call your pharmacy.    Signed, Erma Heritage, PA-C  05/13/2021 5:04 PM    [facility] [address] [place], [place] [address] Phone: [phone] Fax: [phone]

## 2021-05-13 NOTE — Discharge Instructions (Signed)
Call your primary care doctor or specialist as discussed in the next 2-3 days.   Return immediately back to the ER if:  Your symptoms worsen within the next 12-24 hours. You develop new symptoms such as new fevers, persistent vomiting, new pain, shortness of breath, or new weakness or numbness, or if you have any other concerns.  

## 2021-05-13 NOTE — Patient Instructions (Signed)
Medication Instructions:  Your physician recommends that you continue on your current medications as directed. Please refer to the Current Medication list given to you today.   Labwork: None   Testing/Procedures: None   Follow-Up: After ED visit  Any Other Special Instructions Will Be Listed Below (If Applicable).  If you need a refill on your cardiac medications before your next appointment, please call your pharmacy.

## 2021-05-13 NOTE — ED Provider Notes (Signed)
[facility] Provider Note   CSN: [id number] Arrival date & time: 05/13/21  1642     History Chief Complaint  Patient presents with   Emesis    [name] is a 85 y.o. female.  Complaint of nausea and vomiting.  Patient was at a routine office visit with her cardiologist today when she developed nausea and vomiting.  She vomited several times nonbloody vomitus.  Denies any abdominal pain denies any diarrhea denies any fevers or cough or chest pain.  Sent to the ER for further evaluation.  Upon arrival to the ER she states her symptoms have resolved and she feels much better at this time.      Past Medical History:  Diagnosis Date   Bowel obstruction ([place])    Breast cancer ([place])    Breast cancer, stage 3 ([place]) 07/21/2011   Bronchitis    CAD (coronary artery disease)    a. cath in 06/2020 showing mild to moderate nonobstructive CAD   CHF (congestive heart failure) ([place])    a. EF previously 55-60% in 06/2018 b. EF reduced to 40-45% by repeat echo in 03/2019 and at 35-40% by echo in 05/2020   CLL (chronic lymphocytic leukemia) ([place]) 07/21/2011   Migraines    PAF (paroxysmal atrial fibrillation) ([place])    Pneumonia     Patient Active Problem List   Diagnosis Date Noted   Acute respiratory failure with hypoxia ([place]) 04/07/2021   Acute on chronic combined systolic and diastolic CHF (congestive heart failure) ([place]) 04/07/2021   Dyspnea on exertion    Acute on chronic HFrEF (heart failure with reduced ejection fraction) ([place]) 07/09/2020   Abnormal stress test 07/09/2020   Secondary hypercoagulable state ([place]) 03/23/2020   Acute combined systolic and diastolic (congestive) hrt fail ([place]) 03/27/2019   New onset of congestive heart failure ([place]) 03/25/2019   Paroxysmal atrial fibrillation ([facility])    COPD with chronic bronchitis ([facility])    Unilateral primary osteoarthritis, left knee 10/24/2018   COPD with acute exacerbation ([place]) 06/27/2018   Atrial  fibrillation with RVR ([place]) 06/26/2018   SBO (small bowel obstruction) ([place]) 06/05/2015   Hyperglycemia 06/05/2015   Mass of arm 11/15/2011   CLL (chronic lymphocytic leukemia) ([place]) 07/21/2011   Breast cancer, stage 3 ([place]) 07/21/2011   ARTHRITIS, RIGHT HIP 09/23/2008   Hyperlipidemia 04/25/2008   OSA (obstructive sleep apnea) 01/16/2008   FATIGUE 09/14/2007   ANXIETY 01/08/2007   Depression 01/08/2007   ALLERGIC RHINITIS 01/08/2007   ARTHRITIS 01/08/2007   HIP PAIN, RIGHT 01/08/2007   LOW BACK PAIN 01/08/2007   OSTEOPENIA 01/08/2007   MIGRAINES, HX OF 01/08/2007    Past Surgical History:  Procedure Laterality Date   ABDOMINAL HYSTERECTOMY     APPENDECTOMY     CHOLECYSTECTOMY     LAPAROTOMY N/A 06/08/2015   Procedure: EXPLORATORY LAPAROTOMY;  Surgeon: Aviva Signs, MD;  Location: [facility];  Service: General;  Laterality: N/A;   LYSIS OF ADHESION N/A 06/08/2015   Procedure: LYSIS OF ADHESION;  Surgeon: Aviva Signs, MD;  Location: [facility];  Service: General;  Laterality: N/A;   MASTECTOMY     right   RIGHT/LEFT HEART CATH AND CORONARY ANGIOGRAPHY N/A 07/09/2020   Procedure: RIGHT/LEFT HEART CATH AND CORONARY ANGIOGRAPHY;  Surgeon: Nelva Bush, MD;  Location: [facility];  Service: Cardiovascular;  Laterality: N/A;   TOTAL HIP ARTHROPLASTY     right     OB History   No obstetric history on file.  Family History  Problem Relation Age of Onset   Cancer Brother    Cancer Other     Social History   Tobacco Use   Smoking status: Former    Types: Cigarettes    Quit date: 10/26/1978    Years since quitting: 42.5   Smokeless tobacco: Never  Vaping Use   Vaping Use: Never used  Substance Use Topics   Alcohol use: No   Drug use: No    Home Medications Prior to Admission medications   Medication Sig Start Date End Date Taking? Authorizing Provider  acetaminophen (TYLENOL) 500 MG tablet Take 1,000 mg by mouth every 6 (six) hours as needed for moderate pain  or headache.   Yes [provider]  albuterol (PROVENTIL HFA;VENTOLIN HFA) 108 (90 BASE) MCG/ACT inhaler Inhale 2 puffs into the lungs every 6 (six) hours as needed for shortness of breath.   Yes [provider]  albuterol (PROVENTIL) (2.5 MG/3ML) 0.083% nebulizer solution Take 3 mLs (2.5 mg total) by nebulization every 6 (six) hours as needed for wheezing or shortness of breath (when not relieved by inhaler.). 06/27/18  Yes Barton Dubois, MD  atorvastatin (LIPITOR) 20 MG tablet Take 1 tablet (20 mg total) by mouth daily. 07/27/20 05/13/21 Yes Barrett, Evelene Croon, PA-C  bisoprolol (ZEBETA) 5 MG tablet Take 0.5 tablets (2.5 mg total) by mouth daily. 04/09/21  Yes Tat, Shanon Brow, MD  Capsaicin-Menthol (SALONPAS GEL EX) Apply topically.   Yes [provider]  diphenhydrAMINE (BENADRYL) 25 MG tablet Take 25 mg by mouth daily as needed for allergies.   Yes [provider]  ELIQUIS 5 MG TABS tablet TAKE 1 TABLET BY MOUTH 2 TIMES DAILY. 03/19/21  Yes Fay Records, MD  ergocalciferol (VITAMIN D2) 1.25 MG (50000 UT) capsule Take 1 capsule (50,000 Units total) by mouth every Wednesday. 10/14/20  Yes Derek Jack, MD  losartan (COZAAR) 25 MG tablet Take 0.5 tablets (12.5 mg total) by mouth daily. 03/29/21 06/27/21 Yes Fay Records, MD  omeprazole (PRILOSEC) 20 MG capsule Take 20 mg by mouth daily.   Yes [provider]  spironolactone (ALDACTONE) 25 MG tablet Take 1 tablet (25 mg total) by mouth daily. 10/21/20  Yes Strader, [place] M, PA-C  ondansetron (ZOFRAN) 4 MG tablet Take 1 tablet (4 mg total) by mouth every 8 (eight) hours as needed for nausea or vomiting. Patient not taking: Reported on 05/13/2021 05/13/21   Erma Heritage, PA-C    Allergies    Codeine, Losartan, Meclizine, Sulfa antibiotics, and Zoloft [sertraline hcl]  Review of Systems   Review of Systems  Constitutional:  Negative for fever.  HENT:  Negative for ear pain.   Eyes:  Negative for  pain.  Respiratory:  Negative for cough.   Cardiovascular:  Negative for chest pain.  Gastrointestinal:  Negative for abdominal pain.  Genitourinary:  Negative for flank pain.  Musculoskeletal:  Negative for back pain.  Skin:  Negative for rash.  Neurological:  Negative for headaches.   Physical Exam Updated Vital Signs BP (!) 122/58    Pulse 63    Temp 98.5 F (36.9 C) (Oral)    Resp 20    Ht 5\' 1"  (1.549 m)    Wt 72.6 kg    SpO2 96%    BMI 30.23 kg/m   Physical Exam Constitutional:      General: She is not in acute distress.    Appearance: Normal appearance.  HENT:     Head: Normocephalic.  Nose: Nose normal.  Eyes:     Extraocular Movements: Extraocular movements intact.  Cardiovascular:     Rate and Rhythm: Normal rate.  Pulmonary:     Effort: Pulmonary effort is normal.  Abdominal:     General: Abdomen is flat.     Tenderness: There is no abdominal tenderness. There is no guarding or rebound.  Musculoskeletal:        General: Normal range of motion.     Cervical back: Normal range of motion.  Neurological:     General: No focal deficit present.     Mental Status: She is alert. Mental status is at baseline.    ED Results / Procedures / Treatments   Labs (all labs ordered are listed, but only abnormal results are displayed) Labs Reviewed  CBC WITH DIFFERENTIAL/PLATELET - Abnormal; Notable for the following components:      Result Value   WBC 29.0 (*)    Lymphs Abs 23.2 (*)    Basophils Absolute 0.3 (*)    All other components within normal limits  BRAIN NATRIURETIC PEPTIDE - Abnormal; Notable for the following components:   B Natriuretic Peptide 339.0 (*)    All other components within normal limits  BASIC METABOLIC PANEL - Abnormal; Notable for the following components:   Glucose, Bld 128 (*)    BUN 26 (*)    Creatinine, Ser 1.03 (*)    GFR, Estimated 54 (*)    All other components within normal limits  PATHOLOGIST SMEAR REVIEW    EKG EKG  Interpretation  Date/Time:  Thursday May 13 2021 17:01:31 EST Ventricular Rate:  89 PR Interval:    QRS Duration: 142 QT Interval:  409 QTC Calculation: 498 R Axis:   -34 Text Interpretation: Atrial fibrillation Ventricular premature complex Left bundle branch block Confirmed by Thamas Jaegers ([id number]) on 05/13/2021 7:51:16 PM  Radiology No results found.  Procedures Procedures   Medications Ordered in ED Medications - No data to display  ED Course  I have reviewed the triage vital signs and the nursing notes.  Pertinent labs & imaging results that were available during my care of the patient were reviewed by me and considered in my medical decision making (see chart for details).    MDM Rules/Calculators/A&P                         Labs unremarkable white count is elevated and consistent with her history of CLL.  Chemistry otherwise unremarkable.  Patient given p.o. challenge and subsequently no longer vomiting.  Given a prescription of Zofran, advised outpatient follow-up with her doctor within the week.  Advised immediate return for inability to keep down any fluids, fevers pain chest pain or any additional concerns return immediately to the ER.     Final Clinical Impression(s) / ED Diagnoses Final diagnoses:  Vomiting without nausea, unspecified vomiting type    Rx / DC Orders ED Discharge Orders     None        Luna Fuse, MD 05/13/21 2005

## 2021-05-14 ENCOUNTER — Ambulatory Visit (HOSPITAL_COMMUNITY): Payer: Medicare HMO

## 2021-05-14 LAB — PATHOLOGIST SMEAR REVIEW

## 2021-05-19 ENCOUNTER — Ambulatory Visit (HOSPITAL_COMMUNITY)
Admission: RE | Admit: 2021-05-19 | Discharge: 2021-05-19 | Disposition: A | Discharge status: Home / Self Care | Payer: Medicare HMO | Source: Ambulatory Visit | Attending: Hematology | Admitting: Hematology

## 2021-05-19 ENCOUNTER — Other Ambulatory Visit: Payer: Self-pay

## 2021-05-19 DIAGNOSIS — Z1231 Encounter for screening mammogram for malignant neoplasm of breast: Secondary | ICD-10-CM | POA: Insufficient documentation

## 2021-05-19 DIAGNOSIS — K2100 Gastro-esophageal reflux disease with esophagitis, without bleeding: Secondary | ICD-10-CM | POA: Diagnosis not present

## 2021-05-19 DIAGNOSIS — I2510 Atherosclerotic heart disease of native coronary artery without angina pectoris: Secondary | ICD-10-CM | POA: Diagnosis not present

## 2021-05-19 DIAGNOSIS — R739 Hyperglycemia, unspecified: Secondary | ICD-10-CM | POA: Diagnosis not present

## 2021-05-19 DIAGNOSIS — C9111 Chronic lymphocytic leukemia of B-cell type in remission: Secondary | ICD-10-CM | POA: Diagnosis not present

## 2021-05-23 HISTORY — PX: CATARACT EXTRACTION: SUR2

## 2021-05-26 DIAGNOSIS — R739 Hyperglycemia, unspecified: Secondary | ICD-10-CM | POA: Diagnosis not present

## 2021-05-26 DIAGNOSIS — E559 Vitamin D deficiency, unspecified: Secondary | ICD-10-CM | POA: Diagnosis not present

## 2021-05-26 DIAGNOSIS — Z79899 Other long term (current) drug therapy: Secondary | ICD-10-CM | POA: Diagnosis not present

## 2021-05-29 NOTE — Progress Notes (Signed)
Cardiology Office Note    Date:  05/31/2021   ID:  [name], DOB 1935/07/31, MRN [id number]  PCP:  Lemmie Evens, MD  Cardiologist: Dorris Carnes, MD    Patient presents for follow up of PAF and HFrEF   History of Present Illness:    [name] is a 86 y.o. female with past medical history of paroxysmal atrial fibrillation, HFrEF/NICM (EF previously 55-60% in 06/2018, reduced to 40-45% by repeat echo in 03/2019 and at 35-40% by echo in 05/2020), CAD (cath in 06/2020 showing mild to moderate nonobstructive CAD), aortic stenosis, LBBB, CLL and COPD.  The pt has been on Bisoprolol, spironolactone, losartan.  She did not tolerate Entresto or Jardiance    She was seen in clinic on 05/13/21  Nauseated  Vomited  Sent tp ED and then sent home  Pt says symptoms resolved after a couple days   No real testing done   Rx for N/V given     Since then she has done good  Today she feels great   Breathing good   No CP   NO swelling    NO PND   Sleeping OK    Has not had any palpitations    Past Medical History:  Diagnosis Date   Bowel obstruction (HCC)    Breast cancer ([place])    Breast cancer, stage 3 ([place]) 07/21/2011   Bronchitis    CAD (coronary artery disease)    a. cath in 06/2020 showing mild to moderate nonobstructive CAD   CHF (congestive heart failure) ([place])    a. EF previously 55-60% in 06/2018 b. EF reduced to 40-45% by repeat echo in 03/2019 and at 35-40% by echo in 05/2020   CLL (chronic lymphocytic leukemia) ([place]) 07/21/2011   Migraines    PAF (paroxysmal atrial fibrillation) (HCC)    Pneumonia     Past Surgical History:  Procedure Laterality Date   ABDOMINAL HYSTERECTOMY     APPENDECTOMY     CHOLECYSTECTOMY     LAPAROTOMY N/A 06/08/2015   Procedure: EXPLORATORY LAPAROTOMY;  Surgeon: Aviva Signs, MD;  Location: [facility];  Service: General;  Laterality: N/A;   LYSIS OF ADHESION N/A 06/08/2015   Procedure: LYSIS OF ADHESION;  Surgeon: Aviva Signs, MD;  Location:  [facility];  Service: General;  Laterality: N/A;   MASTECTOMY     right   RIGHT/LEFT HEART CATH AND CORONARY ANGIOGRAPHY N/A 07/09/2020   Procedure: RIGHT/LEFT HEART CATH AND CORONARY ANGIOGRAPHY;  Surgeon: Nelva Bush, MD;  Location: [facility];  Service: Cardiovascular;  Laterality: N/A;   TOTAL HIP ARTHROPLASTY     right    Current Medications: Outpatient Medications Prior to Visit  Medication Sig Dispense Refill   acetaminophen (TYLENOL) 500 MG tablet Take 1,000 mg by mouth every 6 (six) hours as needed for moderate pain or headache.     albuterol (PROVENTIL HFA;VENTOLIN HFA) 108 (90 BASE) MCG/ACT inhaler Inhale 2 puffs into the lungs every 6 (six) hours as needed for shortness of breath.     albuterol (PROVENTIL) (2.5 MG/3ML) 0.083% nebulizer solution Take 3 mLs (2.5 mg total) by nebulization every 6 (six) hours as needed for wheezing or shortness of breath (when not relieved by inhaler.). 75 mL 12   atorvastatin (LIPITOR) 20 MG tablet Take 1 tablet (20 mg total) by mouth daily. 90 tablet 3   bisoprolol (ZEBETA) 5 MG tablet Take 0.5 tablets (2.5 mg total) by mouth daily. 30 tablet 1   Capsaicin-Menthol (  SALONPAS GEL EX) Apply topically.     diphenhydrAMINE (BENADRYL) 25 MG tablet Take 25 mg by mouth daily as needed for allergies.     ELIQUIS 5 MG TABS tablet TAKE 1 TABLET BY MOUTH 2 TIMES DAILY. 180 tablet 0   ergocalciferol (VITAMIN D2) 1.25 MG (50000 UT) capsule Take 1 capsule (50,000 Units total) by mouth every Wednesday. 12 capsule 3   losartan (COZAAR) 25 MG tablet Take 0.5 tablets (12.5 mg total) by mouth daily. 45 tablet 3   omeprazole (PRILOSEC) 20 MG capsule Take 20 mg by mouth daily.     ondansetron (ZOFRAN) 4 MG tablet Take 1 tablet (4 mg total) by mouth every 8 (eight) hours as needed for nausea or vomiting. 20 tablet 0   spironolactone (ALDACTONE) 25 MG tablet Take 1 tablet (25 mg total) by mouth daily. 90 tablet 3   No facility-administered medications prior to  visit.     Allergies:   Codeine, Losartan, Meclizine, Sulfa antibiotics, and Zoloft [sertraline hcl]   Social History   Socioeconomic History   Marital status: Widowed    Spouse name: Not on file   Number of children: Not on file   Years of education: 11   Highest education level: Not on file  Occupational History   Not on file  Tobacco Use   Smoking status: Former    Types: Cigarettes    Quit date: 10/26/1978    Years since quitting: 42.6   Smokeless tobacco: Never  Vaping Use   Vaping Use: Never used  Substance and Sexual Activity   Alcohol use: No   Drug use: No   Sexual activity: Not Currently  Other Topics Concern   Not on file  Social History Narrative   Pt lives by herself   Social Determinants of Health   Financial Resource Strain: Not on file  Food Insecurity: Not on file  Transportation Needs: Not on file  Physical Activity: Not on file  Stress: Not on file  Social Connections: Not on file     Family History:  The patient's family history includes Cancer in her brother and another family member.   Review of Systems:    Please see the history of present illness.     All other systems reviewed and are otherwise negative except as noted above.   Physical Exam:    VS:  BP 124/60    Pulse 61    Ht 5\' 1"  (1.549 m)    Wt 163 lb 3.2 oz (74 kg)    SpO2 98%    BMI 30.84 kg/m    General: Obese 86 year old woman in no acute distress. Head: Normocephalic, atraumatic. Neck: No carotid bruits. JVD not elevated.  Lungs: Clear to auscultation  heart: Regular rate and rhythm.  2/6 SEM along RUSB.  Abdomen: Obese   Nontender  Msk: Moving all extremities  Extremities:  No pitting edema. . Neuro: Alert and oriented X 3. Moves all extremities spontaneously. No focal deficits noted. Psych:  Responds to questions appropriately with a normal affect. Skin: No rashes or lesions noted  Wt Readings from Last 3 Encounters:  05/31/21 163 lb 3.2 oz (74 kg)  05/13/21 160  lb (72.6 kg)  05/13/21 160 lb (72.6 kg)     Studies/Labs Reviewed:   EKG:  EKG is not ordered today.   Recent Labs: 10/07/2020: ALT 17 05/13/2021: B Natriuretic Peptide 339.0; BUN 26; Creatinine, Ser 1.03; Hemoglobin 13.6; Platelets 177; Potassium 3.9; Sodium 141   Lipid Panel  Component Value Date/Time   CHOL 106 04/20/2021 1330   TRIG 118 04/20/2021 1330   HDL 40 (L) 04/20/2021 1330   CHOLHDL 2.7 04/20/2021 1330   VLDL 24 04/20/2021 1330   LDLCALC 42 04/20/2021 1330    Additional studies/ records that were reviewed today include:   Cardiac Catheterization: 07/09/2020 Conclusions: Mild to moderate, non-obstructive coronary artery disease with 30-40% mid LAD stenosis and mild luminal irregularities involving the LCx and RCA. Severely reduced left ventricular systolic function (LVEF 79-48%). Upper normal left heart, right heart, and pulmonary artery pressures. Normal Fick cardiac output/index. Mild aortic stenosis.   Recommendations: Medical therapy and risk factor modification to prevent progression of coronary artery disease. Escalate goal directed medical therapy for treatment of nonischemic cardiomyopathy, as tolerated. Restart apixaban tomorrow morning if no evidence of bleeding/vascular complication.    Echocardiogram: 12/21/2020 IMPRESSIONS     1. Left ventricular ejection fraction, by estimation, is 35 to 40%. The  left ventricle has moderately decreased function. The left ventricle  demonstrates regional wall motion abnormalities (see scoring  diagram/findings for description). The left  ventricular internal cavity size was mildly dilated. Left ventricular  diastolic parameters are consistent with Grade I diastolic dysfunction  (impaired relaxation).   2. Right ventricular systolic function is normal. The right ventricular  size is normal. Tricuspid regurgitation signal is inadequate for assessing  PA pressure.   3. Mild mitral valve regurgitation.   4.  The aortic valve is tricuspid. There is moderate calcification of the  aortic valve. Aortic valve regurgitation is not visualized. Mild aortic  valve stenosis. Aortic valve mean gradient measures 10.5 mmHg.  Dimentionless index 0.46.   5. The inferior vena cava is normal in size with greater than 50%  respiratory variability, suggesting right atrial pressure of 3 mmHg.   Echo:  04/08/21 eft ventricular ejection fraction, by estimation, is 35 to 40%. The left ventricle has moderately decreased function. The left ventricle demonstrates regional wall motion abnormalities (see scoring diagram/findings for description). There is moderate left ventricular hypertrophy. Left ventricular diastolic parameters are consistent with Grade I diastolic dysfunction (impaired relaxation). Elevated left atrial pressure. 1. Right ventricular systolic function is normal. The right ventricular size is normal. There is mildly elevated pulmonary artery systolic pressure. 2. 3. Left atrial size was moderately dilated. 4. Right atrial size was mild to moderately dilated. The mitral valve is abnormal. Mild mitral valve regurgitation. Mild mitral stenosis. Moderate mitral annular calcification. 5. The aortic valve is tricuspid. There is moderate calcification of the aortic valve. There is moderate thickening of the aortic valve. Aortic valve regurgitation is not visualized. Mild aortic valve stenosis.  Assessment:    No diagnosis found.    Plan:   In order of problems listed above:  1. HFrEF/NICM  - Her EF remains reduced at 35 to 40% by most recent echocardiogram  Doing well  Feels great I would keep on current regimen   She did not tolerate Jardiance or Entresto in past     2. Paroxysmal Atrial Fibrillation - She denies any recent palpitations.   Continue Bisoprolol 5 mg daily for rate control. Keep on current dose of Eliquis. 3. CAD - She had mild to moderate nonobstructive CAD by recent  catheterization in 06/2020. She denies any recent anginal symptoms. Continue Atorvastatin 20 mg daily and Bisoprolol 5 mg daily. She is not on ASA given the need for anticoagulation  4. Aortic Stenosis -Continue to follow with periodic echoes.  MIld on recent echo  5. LIpids   LDL excellent on recent labs   6  CLL - Followed by Oncology.   Follow-up at end of May 2023 Medication Adjustments/Labs and Tests Ordered: Current medicines are reviewed at length with the patient today.  Concerns regarding medicines are outlined above.  Medication changes, Labs and Tests ordered today are listed in the Patient Instructions below. Patient Instructions  Medication Instructions:  Your physician recommends that you continue on your current medications as directed. Please refer to the Current Medication list given to you today.  *If you need a refill on your cardiac medications before your next appointment, please call your pharmacy*   Lab Work: NONE   If you have labs (blood work) drawn today and your tests are completely normal, you will receive your results only by: [place] (if you have MyChart) OR A paper copy in the mail If you have any lab test that is abnormal or we need to change your treatment, we will call you to review the results.   Testing/Procedures: NONE    Follow-Up: At [facility], you and your health needs are our priority.  As part of our continuing mission to provide you with exceptional heart care, we have created designated Provider Care Teams.  These Care Teams include your primary Cardiologist (physician) and Advanced Practice Providers (APPs -  Physician Assistants and Nurse Practitioners) who all work together to provide you with the care you need, when you need it.  We recommend signing up for the patient portal called "MyChart".  Sign up information is provided on this After Visit Summary.  MyChart is used to connect with patients for Virtual Visits  (Telemedicine).  Patients are able to view lab/test results, encounter notes, upcoming appointments, etc.  Non-urgent messages can be sent to your provider as well.   To learn more about what you can do with MyChart, go to [email or web address].    Your next appointment:   4 month(s)  The format for your next appointment:   In Person  Provider:   Dorris Carnes, MD    Other Instructions Thank you for choosing [facility]!      Signed, Dorris Carnes, MD  05/31/2021 1:46 PM    [facility] [address] [place], [place] [address] Phone: [phone] Fax: [phone]

## 2021-05-31 ENCOUNTER — Encounter: Payer: Self-pay | Admitting: Internal Medicine

## 2021-05-31 ENCOUNTER — Ambulatory Visit: Payer: Medicare HMO | Admitting: Internal Medicine

## 2021-05-31 ENCOUNTER — Other Ambulatory Visit: Payer: Self-pay

## 2021-05-31 VITALS — BP 124/60 | HR 61 | Ht 61.0 in | Wt 163.2 lb

## 2021-05-31 DIAGNOSIS — I5043 Acute on chronic combined systolic (congestive) and diastolic (congestive) heart failure: Secondary | ICD-10-CM

## 2021-05-31 NOTE — Patient Instructions (Signed)
Medication Instructions:  Your physician recommends that you continue on your current medications as directed. Please refer to the Current Medication list given to you today.  *If you need a refill on your cardiac medications before your next appointment, please call your pharmacy*   Lab Work: NONE   If you have labs (blood work) drawn today and your tests are completely normal, you will receive your results only by: [facility] (if you have MyChart) OR A paper copy in the mail If you have any lab test that is abnormal or we need to change your treatment, we will call you to review the results.   Testing/Procedures: NONE    Follow-Up: At [facility], you and your health needs are our priority.  As part of our continuing mission to provide you with exceptional heart care, we have created designated Provider Care Teams.  These Care Teams include your primary Cardiologist (physician) and Advanced Practice Providers (APPs -  Physician Assistants and Nurse Practitioners) who all work together to provide you with the care you need, when you need it.  We recommend signing up for the patient portal called "MyChart".  Sign up information is provided on this After Visit Summary.  MyChart is used to connect with patients for Virtual Visits (Telemedicine).  Patients are able to view lab/test results, encounter notes, upcoming appointments, etc.  Non-urgent messages can be sent to your provider as well.   To learn more about what you can do with MyChart, go to [email or web address].    Your next appointment:   4 month(s)  The format for your next appointment:   In Person  Provider:   Dorris Carnes, MD    Other Instructions Thank you for choosing [facility]!

## 2021-06-14 ENCOUNTER — Telehealth: Payer: Self-pay | Admitting: *Deleted

## 2021-06-14 NOTE — Telephone Encounter (Signed)
Patient called to clarify dose of Vitamin D to be 5,000 Units, which is correct and was initiated on 04/27/21 at visit with Dr Delton Coombes.  Medication list changed to reflect dosage.

## 2021-06-16 DIAGNOSIS — G8929 Other chronic pain: Secondary | ICD-10-CM | POA: Diagnosis not present

## 2021-06-16 DIAGNOSIS — I509 Heart failure, unspecified: Secondary | ICD-10-CM | POA: Diagnosis not present

## 2021-06-16 DIAGNOSIS — J449 Chronic obstructive pulmonary disease, unspecified: Secondary | ICD-10-CM | POA: Diagnosis not present

## 2021-06-16 DIAGNOSIS — Z853 Personal history of malignant neoplasm of breast: Secondary | ICD-10-CM | POA: Diagnosis not present

## 2021-06-16 DIAGNOSIS — Z7901 Long term (current) use of anticoagulants: Secondary | ICD-10-CM | POA: Diagnosis not present

## 2021-06-16 DIAGNOSIS — Z008 Encounter for other general examination: Secondary | ICD-10-CM | POA: Diagnosis not present

## 2021-06-16 DIAGNOSIS — E785 Hyperlipidemia, unspecified: Secondary | ICD-10-CM | POA: Diagnosis not present

## 2021-06-16 DIAGNOSIS — I4891 Unspecified atrial fibrillation: Secondary | ICD-10-CM | POA: Diagnosis not present

## 2021-06-16 DIAGNOSIS — K219 Gastro-esophageal reflux disease without esophagitis: Secondary | ICD-10-CM | POA: Diagnosis not present

## 2021-06-16 DIAGNOSIS — I110 Hypertensive heart disease with heart failure: Secondary | ICD-10-CM | POA: Diagnosis not present

## 2021-06-16 DIAGNOSIS — Z87891 Personal history of nicotine dependence: Secondary | ICD-10-CM | POA: Diagnosis not present

## 2021-07-12 DIAGNOSIS — H524 Presbyopia: Secondary | ICD-10-CM | POA: Diagnosis not present

## 2021-07-12 DIAGNOSIS — H3540 Unspecified peripheral retinal degeneration: Secondary | ICD-10-CM | POA: Diagnosis not present

## 2021-07-19 ENCOUNTER — Telehealth: Payer: Self-pay | Admitting: *Deleted

## 2021-07-19 DIAGNOSIS — H25811 Combined forms of age-related cataract, right eye: Secondary | ICD-10-CM | POA: Diagnosis not present

## 2021-07-19 DIAGNOSIS — H25812 Combined forms of age-related cataract, left eye: Secondary | ICD-10-CM | POA: Diagnosis not present

## 2021-07-19 DIAGNOSIS — H353131 Nonexudative age-related macular degeneration, bilateral, early dry stage: Secondary | ICD-10-CM | POA: Diagnosis not present

## 2021-07-19 DIAGNOSIS — Z01818 Encounter for other preprocedural examination: Secondary | ICD-10-CM | POA: Diagnosis not present

## 2021-07-19 NOTE — Telephone Encounter (Signed)
° °  Pre-operative Risk Assessment    Patient Name: [name]  DOB: 1936/01/17 MRN: [id number]      Request for Surgical Clearance    Procedure:   CATARACT EXTRACTION BY PE, IOL-RIGHT EYE  Date of Surgery:  Clearance 07/30/21                                 Surgeon:  DR. Julian Reil Surgeon's Group or Practice Name:  [facility] Phone number:  [phone] Fax number:  [phone]   Type of Clearance Requested:   - Medical -PER CLEARANCE  REQUEST THE PT DOES NOT NEED TO HOLD ANY BLOOD THINNERS   Type of Anesthesia:   IV SEDATION   Additional requests/questions:    Jiles Prows   07/19/2021, 6:08 PM

## 2021-07-20 NOTE — Telephone Encounter (Signed)
Notes faxed to surgeon. This phone note will be removed from the preop pool. Richardson Dopp, PA-C  07/20/2021 9:22 AM

## 2021-07-20 NOTE — Telephone Encounter (Signed)
° °  Patient Name: [name]  DOB: [birth year]  MRN: [id number]   Primary Cardiologist: Dorris Carnes, MD  Chart reviewed as part of pre-operative protocol coverage.   Pt has a hx of: Paroxysmal atrial fibrillation  (HFrEF) heart failure with reduced ejection fraction (EF 35-40) Non-ischemic cardiomyopathy  Mild to mod non-obstructive coronary artery disease  Aortic stenosis (mild; mean 10.5 mmHg in 8/22) Chronic Lymphocytic Leukemia   COPD  Left Bundle Branch Block  She was last seen in clinic by Dr. Harrington Challenger in 05/2021.    PLAN:  Cataract extractions are recognized in guidelines as low risk surgeries that do not typically require specific preoperative testing or holding of blood thinner (anticoagulation) therapy. Therefore, given past medical history and time since last visit, based on ACC/AHA guidelines, Tashari Schoenfelder Guerrera would be at acceptable risk for the planned procedure without further cardiovascular testing.   Please call with questions.  Richardson Dopp, PA-C 07/20/2021, 9:16 AM

## 2021-07-27 ENCOUNTER — Other Ambulatory Visit: Payer: Self-pay | Admitting: Student

## 2021-07-30 DIAGNOSIS — H2512 Age-related nuclear cataract, left eye: Secondary | ICD-10-CM | POA: Diagnosis not present

## 2021-07-30 DIAGNOSIS — H25811 Combined forms of age-related cataract, right eye: Secondary | ICD-10-CM | POA: Diagnosis not present

## 2021-08-02 ENCOUNTER — Other Ambulatory Visit: Payer: Self-pay | Admitting: Physician Assistant

## 2021-08-05 ENCOUNTER — Telehealth: Payer: Self-pay | Admitting: Internal Medicine

## 2021-08-05 MED ORDER — BISOPROLOL FUMARATE 5 MG PO TABS: 2.5000 mg | ORAL_TABLET | Freq: Every day | ORAL | 1 refills | Status: DC

## 2021-08-05 NOTE — Telephone Encounter (Signed)
Completed.

## 2021-08-05 NOTE — Telephone Encounter (Signed)
?*  STAT* If patient is at the pharmacy, call can be transferred to refill team. ? ? ?1. Which medications need to be refilled? (please list name of each medication and dose if known) bisoprolol (ZEBETA) 5 MG tablet ? ?2. Which pharmacy/location (including street and city if local pharmacy) is medication to be sent to? [facility], [place] ? ?3. Do they need a 30 day or 90 day supply? 90 day  ?

## 2021-08-13 DIAGNOSIS — H25812 Combined forms of age-related cataract, left eye: Secondary | ICD-10-CM | POA: Diagnosis not present

## 2021-08-18 DIAGNOSIS — R110 Nausea: Secondary | ICD-10-CM | POA: Diagnosis not present

## 2021-08-18 DIAGNOSIS — Z7901 Long term (current) use of anticoagulants: Secondary | ICD-10-CM | POA: Diagnosis not present

## 2021-08-18 DIAGNOSIS — C9111 Chronic lymphocytic leukemia of B-cell type in remission: Secondary | ICD-10-CM | POA: Diagnosis not present

## 2021-08-18 DIAGNOSIS — I480 Paroxysmal atrial fibrillation: Secondary | ICD-10-CM | POA: Diagnosis not present

## 2021-09-18 ENCOUNTER — Other Ambulatory Visit: Payer: Self-pay | Admitting: Internal Medicine

## 2021-09-18 DIAGNOSIS — I480 Paroxysmal atrial fibrillation: Secondary | ICD-10-CM

## 2021-09-20 NOTE — Telephone Encounter (Signed)
Eliquis '5mg'$  refill request received. Patient is 86 years old, weight-74kg, Crea-1.03 on 05/13/2021, Diagnosis-Afib, and last seen by Dr. Harrington Challenger on 05/31/2021. Dose is appropriate based on dosing criteria. Will send in refill to requested pharmacy.   ?

## 2021-09-29 ENCOUNTER — Encounter: Payer: Self-pay | Admitting: Internal Medicine

## 2021-09-29 ENCOUNTER — Ambulatory Visit (INDEPENDENT_AMBULATORY_CARE_PROVIDER_SITE_OTHER): Payer: Medicare HMO | Admitting: Internal Medicine

## 2021-09-29 VITALS — BP 110/72 | HR 64 | Ht 62.0 in | Wt 168.0 lb

## 2021-09-29 DIAGNOSIS — I2510 Atherosclerotic heart disease of native coronary artery without angina pectoris: Secondary | ICD-10-CM | POA: Diagnosis not present

## 2021-09-29 NOTE — Patient Instructions (Signed)
Medication Instructions:  ?Your physician recommends that you continue on your current medications as directed. Please refer to the Current Medication list given to you today. ? ?*If you need a refill on your cardiac medications before your next appointment, please call your pharmacy* ? ? ?Lab Work: ?NONE  ? ?If you have labs (blood work) drawn today and your tests are completely normal, you will receive your results only by: ?MyChart Message (if you have MyChart) OR ?A paper copy in the mail ?If you have any lab test that is abnormal or we need to change your treatment, we will call you to review the results. ? ? ?Testing/Procedures: ?NONE  ? ? ?Follow-Up: ?At [facility], you and your health needs are our priority.  As part of our continuing mission to provide you with exceptional heart care, we have created designated Provider Care Teams.  These Care Teams include your primary Cardiologist (physician) and Advanced Practice Providers (APPs -  Physician Assistants and Nurse Practitioners) who all work together to provide you with the care you need, when you need it. ? ?We recommend signing up for the patient portal called "MyChart".  Sign up information is provided on this After Visit Summary.  MyChart is used to connect with patients for Virtual Visits (Telemedicine).  Patients are able to view lab/test results, encounter notes, upcoming appointments, etc.  Non-urgent messages can be sent to your provider as well.   ?To learn more about what you can do with MyChart, go to [email or web address].   ? ?Your next appointment:   ? January ? ?The format for your next appointment:   ?In Person ? ?Provider:   ?Dorris Carnes, MD  ? ? ?Other Instructions ?Thank you for choosing [facility]! ? ? ? ?Important Information About [facility] ? ? ? ? ? ? ?

## 2021-09-29 NOTE — Progress Notes (Signed)
? ?Cardiology Office Note   ? ?Date:  10/01/2021  ? ?ID:  [name], DOB June 10, 1935, MRN [id number] ? ?PCP:  Lemmie Evens, MD  ?Cardiologist: Dorris Carnes, MD   ? ?Patient presents for follow up of PAF and HFrEF ? ? ?History of Present Illness:   ? ?[name] is a 86 y.o. female with past medical history of paroxysmal atrial fibrillation, HFrEF/NICM (EF previously 55-60% in 06/2018, reduced to 40-45% by repeat echo in 03/2019 and at 35-40% by echo in 05/2020), CAD (cath in 06/2020 showing mild to moderate nonobstructive CAD), aortic stenosis, LBBB, CLL and COPD.  The pt has been on Bisoprolol, spironolactone, losartan.  She did not tolerate Entresto or Jardiance   ? ?I saw the pt last in Dec 2022   Since then she says that she has been doing good   She says she "feels great"    ?Breathing good   No CP   NO swelling in legs     ?No PND   Sleeping OK    ?Has not had any palpitations  ? ? ?Past Medical History:  ?Diagnosis Date  ? Bowel obstruction ([place])   ? Breast cancer ([place])   ? Breast cancer, stage 3 ([place]) 07/21/2011  ? Bronchitis   ? CAD (coronary artery disease)   ? a. cath in 06/2020 showing mild to moderate nonobstructive CAD  ? CHF (congestive heart failure) ([place])   ? a. EF previously 55-60% in 06/2018 b. EF reduced to 40-45% by repeat echo in 03/2019 and at 35-40% by echo in 05/2020  ? CLL (chronic lymphocytic leukemia) ([place]) 07/21/2011  ? Migraines   ? PAF (paroxysmal atrial fibrillation) ([place])   ? Pneumonia   ? ? ?Past Surgical History:  ?Procedure Laterality Date  ? ABDOMINAL HYSTERECTOMY    ? APPENDECTOMY    ? CHOLECYSTECTOMY    ? LAPAROTOMY N/A 06/08/2015  ? Procedure: EXPLORATORY LAPAROTOMY;  Surgeon: Aviva Signs, MD;  Location: [facility];  Service: General;  Laterality: N/A;  ? LYSIS OF ADHESION N/A 06/08/2015  ? Procedure: LYSIS OF ADHESION;  Surgeon: Aviva Signs, MD;  Location: [facility];  Service: General;  Laterality: N/A;  ? MASTECTOMY    ? right  ? RIGHT/LEFT HEART CATH AND CORONARY  ANGIOGRAPHY N/A 07/09/2020  ? Procedure: RIGHT/LEFT HEART CATH AND CORONARY ANGIOGRAPHY;  Surgeon: Nelva Bush, MD;  Location: [facility];  Service: Cardiovascular;  Laterality: N/A;  ? TOTAL HIP ARTHROPLASTY    ? right  ? ? ?Current Medications: ?Outpatient Medications Prior to Visit  ?Medication Sig Dispense Refill  ? acetaminophen (TYLENOL) 500 MG tablet Take 1,000 mg by mouth every 6 (six) hours as needed for moderate pain or headache.    ? albuterol (PROVENTIL HFA;VENTOLIN HFA) 108 (90 BASE) MCG/ACT inhaler Inhale 2 puffs into the lungs every 6 (six) hours as needed for shortness of breath.    ? albuterol (PROVENTIL) (2.5 MG/3ML) 0.083% nebulizer solution Take 3 mLs (2.5 mg total) by nebulization every 6 (six) hours as needed for wheezing or shortness of breath (when not relieved by inhaler.). 75 mL 12  ? apixaban (ELIQUIS) 5 MG TABS tablet TAKE 1 TABLET BY MOUTH 2 TIMES DAILY. 180 tablet 1  ? atorvastatin (LIPITOR) 20 MG tablet TAKE ONE TABLET BY MOUTH ONCE DAILY. 90 tablet 2  ? bisoprolol (ZEBETA) 5 MG tablet Take 0.5 tablets (2.5 mg total) by mouth daily. 30 tablet 1  ? Capsaicin-Menthol (SALONPAS GEL EX) Apply topically.    ?  cholecalciferol (VITAMIN D3) 25 MCG (1000 UNIT) tablet Take 1 capsule by mouth daily.    ? diphenhydrAMINE (BENADRYL) 25 MG tablet Take 25 mg by mouth daily as needed for allergies.    ? losartan (COZAAR) 25 MG tablet Take 0.5 tablets (12.5 mg total) by mouth daily. 45 tablet 3  ? omeprazole (PRILOSEC) 20 MG capsule Take 20 mg by mouth daily.    ? ondansetron (ZOFRAN) 4 MG tablet Take 1 tablet (4 mg total) by mouth every 8 (eight) hours as needed for nausea or vomiting. 20 tablet 0  ? spironolactone (ALDACTONE) 25 MG tablet TAKE (1) TABLET BY MOUTH DAILY. 90 tablet 1  ? ?No facility-administered medications prior to visit.  ?  ? ?Allergies:   Codeine, Losartan, Meclizine, Sulfa antibiotics, and Zoloft [sertraline hcl]  ? ?Social History  ? ?Socioeconomic History  ? Marital  status: Widowed  ?  Spouse name: Not on file  ? Number of children: Not on file  ? Years of education: 60  ? Highest education level: Not on file  ?Occupational History  ? Not on file  ?Tobacco Use  ? Smoking status: Former  ?  Types: Cigarettes  ?  Quit date: 10/26/1978  ?  Years since quitting: 42.9  ? Smokeless tobacco: Never  ?Vaping Use  ? Vaping Use: Never used  ?Substance and Sexual Activity  ? Alcohol use: No  ? Drug use: No  ? Sexual activity: Not Currently  ?Other Topics Concern  ? Not on file  ?Social History Narrative  ? Pt lives by herself  ? ?Social Determinants of Health  ? ?Financial Resource Strain: Not on file  ?Food Insecurity: Not on file  ?Transportation Needs: Not on file  ?Physical Activity: Not on file  ?Stress: Not on file  ?Social Connections: Not on file  ?  ? ?Family History:  The patient's family history includes Cancer in her brother and another family member.  ? ?Review of Systems:   ? ?Please see the history of present illness.    ? ?All other systems reviewed and are otherwise negative except as noted above. ? ? ?Physical Exam:   ? ?VS:  BP 110/72   Pulse 64   Ht '5\' 2"'$  (1.575 m)   Wt 168 lb (76.2 kg)   SpO2 98%   BMI 30.73 kg/m?    ?General: Obese 86 year old woman in no acute distress. ?Head: Normocephalic, atraumatic. ?Neck: No carotid bruits. JVD not elevated.  ?Lungs: Clear to auscultation  ?heart: Regular rate and rhythm.  2/6 SEM along RUSB.  ?Abdomen: Obese   Nontender  ?Msk: Moving all extremities  ?Extremities:  No pitting edema. Marland Kitchen ?Neuro: Alert and oriented X 3. Moves all extremities spontaneously. No focal deficits noted. ?Psych:  Responds to questions appropriately with a normal affect. ?Skin: No rashes or lesions noted ? ?Wt Readings from Last 3 Encounters:  ?09/29/21 168 lb (76.2 kg)  ?05/31/21 163 lb 3.2 oz (74 kg)  ?05/13/21 160 lb (72.6 kg)  ?  ? ?Studies/Labs Reviewed:  ? ?EKG:  EKG is not ordered today.  ? ?Recent Labs: ?10/07/2020: ALT 17 ?05/13/2021: B  Natriuretic Peptide 339.0; BUN 26; Creatinine, Ser 1.03; Hemoglobin 13.6; Platelets 177; Potassium 3.9; Sodium 141  ? ?Lipid Panel ?   ?Component Value Date/Time  ? CHOL 106 04/20/2021 1330  ? TRIG 118 04/20/2021 1330  ? HDL 40 (L) 04/20/2021 1330  ? CHOLHDL 2.7 04/20/2021 1330  ? VLDL 24 04/20/2021 1330  ? [place] 42 04/20/2021  1330  ? ? ?Additional studies/ records that were reviewed today include:  ? ?Cardiac Catheterization: 07/09/2020 ?Conclusions: ?Mild to moderate, non-obstructive coronary artery disease with 30-40% mid LAD stenosis and mild luminal irregularities involving the LCx and RCA. ?Severely reduced left ventricular systolic function (LVEF 16-10%). ?Upper normal left heart, right heart, and pulmonary artery pressures. ?Normal Fick cardiac output/index. ?Mild aortic stenosis. ?  ?Recommendations: ?Medical therapy and risk factor modification to prevent progression of coronary artery disease. ?Escalate goal directed medical therapy for treatment of nonischemic cardiomyopathy, as tolerated. ?Restart apixaban tomorrow morning if no evidence of bleeding/vascular complication. ?  ? ?Echocardiogram: 04/08/21 ? ? ? 1. Left ventricular ejection fraction, by estimation, is 35 to 40%. The  ?left ventricle has moderately decreased function. The left ventricle  ?demonstrates regional wall motion abnormalities (see scoring  ?diagram/findings for description). There is  ?moderate left ventricular hypertrophy. Left ventricular diastolic  ?parameters are consistent with Grade I diastolic dysfunction (impaired  ?relaxation). Elevated left atrial pressure.  ? 2. Right ventricular systolic function is normal. The right ventricular  ?size is normal. There is mildly elevated pulmonary artery systolic  ?pressure.  ? 3. Left atrial size was moderately dilated.  ? 4. Right atrial size was mild to moderately dilated.  ? 5. The mitral valve is abnormal. Mild mitral valve regurgitation. Mild  ?mitral stenosis. Moderate mitral  annular calcification.  ? 6. The aortic valve is tricuspid. There is moderate calcification of the  ?aortic valve. There is moderate thickening of the aortic valve. Aortic  ?valve regurgitation is not visualized. Mild a

## 2021-10-04 ENCOUNTER — Other Ambulatory Visit: Payer: Self-pay | Admitting: Internal Medicine

## 2021-10-25 ENCOUNTER — Inpatient Hospital Stay (HOSPITAL_COMMUNITY): Payer: Medicare HMO | Attending: Hematology

## 2021-10-25 DIAGNOSIS — J90 Pleural effusion, not elsewhere classified: Secondary | ICD-10-CM | POA: Insufficient documentation

## 2021-10-25 DIAGNOSIS — Z809 Family history of malignant neoplasm, unspecified: Secondary | ICD-10-CM | POA: Diagnosis not present

## 2021-10-25 DIAGNOSIS — C9110 Chronic lymphocytic leukemia of B-cell type not having achieved remission: Secondary | ICD-10-CM | POA: Diagnosis not present

## 2021-10-25 DIAGNOSIS — Z87891 Personal history of nicotine dependence: Secondary | ICD-10-CM | POA: Insufficient documentation

## 2021-10-25 DIAGNOSIS — I4891 Unspecified atrial fibrillation: Secondary | ICD-10-CM | POA: Insufficient documentation

## 2021-10-25 DIAGNOSIS — Z853 Personal history of malignant neoplasm of breast: Secondary | ICD-10-CM | POA: Diagnosis not present

## 2021-10-25 DIAGNOSIS — E559 Vitamin D deficiency, unspecified: Secondary | ICD-10-CM | POA: Diagnosis not present

## 2021-10-25 LAB — CBC WITH DIFFERENTIAL/PLATELET
Abs Immature Granulocytes: 0.06 10*3/uL (ref 0.00–0.07)
Basophils Absolute: 0.1 10*3/uL (ref 0.0–0.1)
Basophils Relative: 0 %
Eosinophils Absolute: 0.2 10*3/uL (ref 0.0–0.5)
Eosinophils Relative: 0 %
HCT: 36.7 % (ref 36.0–46.0)
Hemoglobin: 11.8 g/dL — ABNORMAL LOW (ref 12.0–15.0)
Immature Granulocytes: 0 %
Lymphocytes Relative: 83 %
Lymphs Abs: 30.6 10*3/uL — ABNORMAL HIGH (ref 0.7–4.0)
MCH: 30.3 pg (ref 26.0–34.0)
MCHC: 32.2 g/dL (ref 30.0–36.0)
MCV: 94.3 fL (ref 80.0–100.0)
Monocytes Absolute: 0.9 10*3/uL (ref 0.1–1.0)
Monocytes Relative: 3 %
Neutro Abs: 5.1 10*3/uL (ref 1.7–7.7)
Neutrophils Relative %: 14 %
Platelets: 190 10*3/uL (ref 150–400)
RBC: 3.89 MIL/uL (ref 3.87–5.11)
RDW: 14.2 % (ref 11.5–15.5)
WBC: 36.9 10*3/uL — ABNORMAL HIGH (ref 4.0–10.5)
nRBC: 0 % (ref 0.0–0.2)

## 2021-10-25 LAB — COMPREHENSIVE METABOLIC PANEL
ALT: 15 U/L (ref 0–44)
AST: 22 U/L (ref 15–41)
Albumin: 4.2 g/dL (ref 3.5–5.0)
Alkaline Phosphatase: 39 U/L (ref 38–126)
Anion gap: 7 (ref 5–15)
BUN: 34 mg/dL — ABNORMAL HIGH (ref 8–23)
CO2: 22 mmol/L (ref 22–32)
Calcium: 9 mg/dL (ref 8.9–10.3)
Chloride: 109 mmol/L (ref 98–111)
Creatinine, Ser: 0.84 mg/dL (ref 0.44–1.00)
GFR, Estimated: >60 mL/min (ref >60)
Glucose, Bld: 171 mg/dL — ABNORMAL HIGH (ref 70–99)
Potassium: 4.2 mmol/L (ref 3.5–5.1)
Sodium: 138 mmol/L (ref 135–145)
Total Bilirubin: 0.7 mg/dL (ref 0.3–1.2)
Total Protein: 6.6 g/dL (ref 6.5–8.1)

## 2021-10-25 LAB — LACTATE DEHYDROGENASE: LDH: 125 U/L (ref 98–192)

## 2021-10-27 LAB — VITAMIN D 25 HYDROXY (VIT D DEFICIENCY, FRACTURES)

## 2021-10-29 IMAGING — DX DG CHEST 1V PORT
1 series · 1 of 1 positions shown · non-contrast
Comparison: Radiograph 03/13/2019

CLINICAL DATA: Shortness of breath, worsening for 2 weeks

EXAM:
PORTABLE CHEST 1 VIEW

[chest ap]
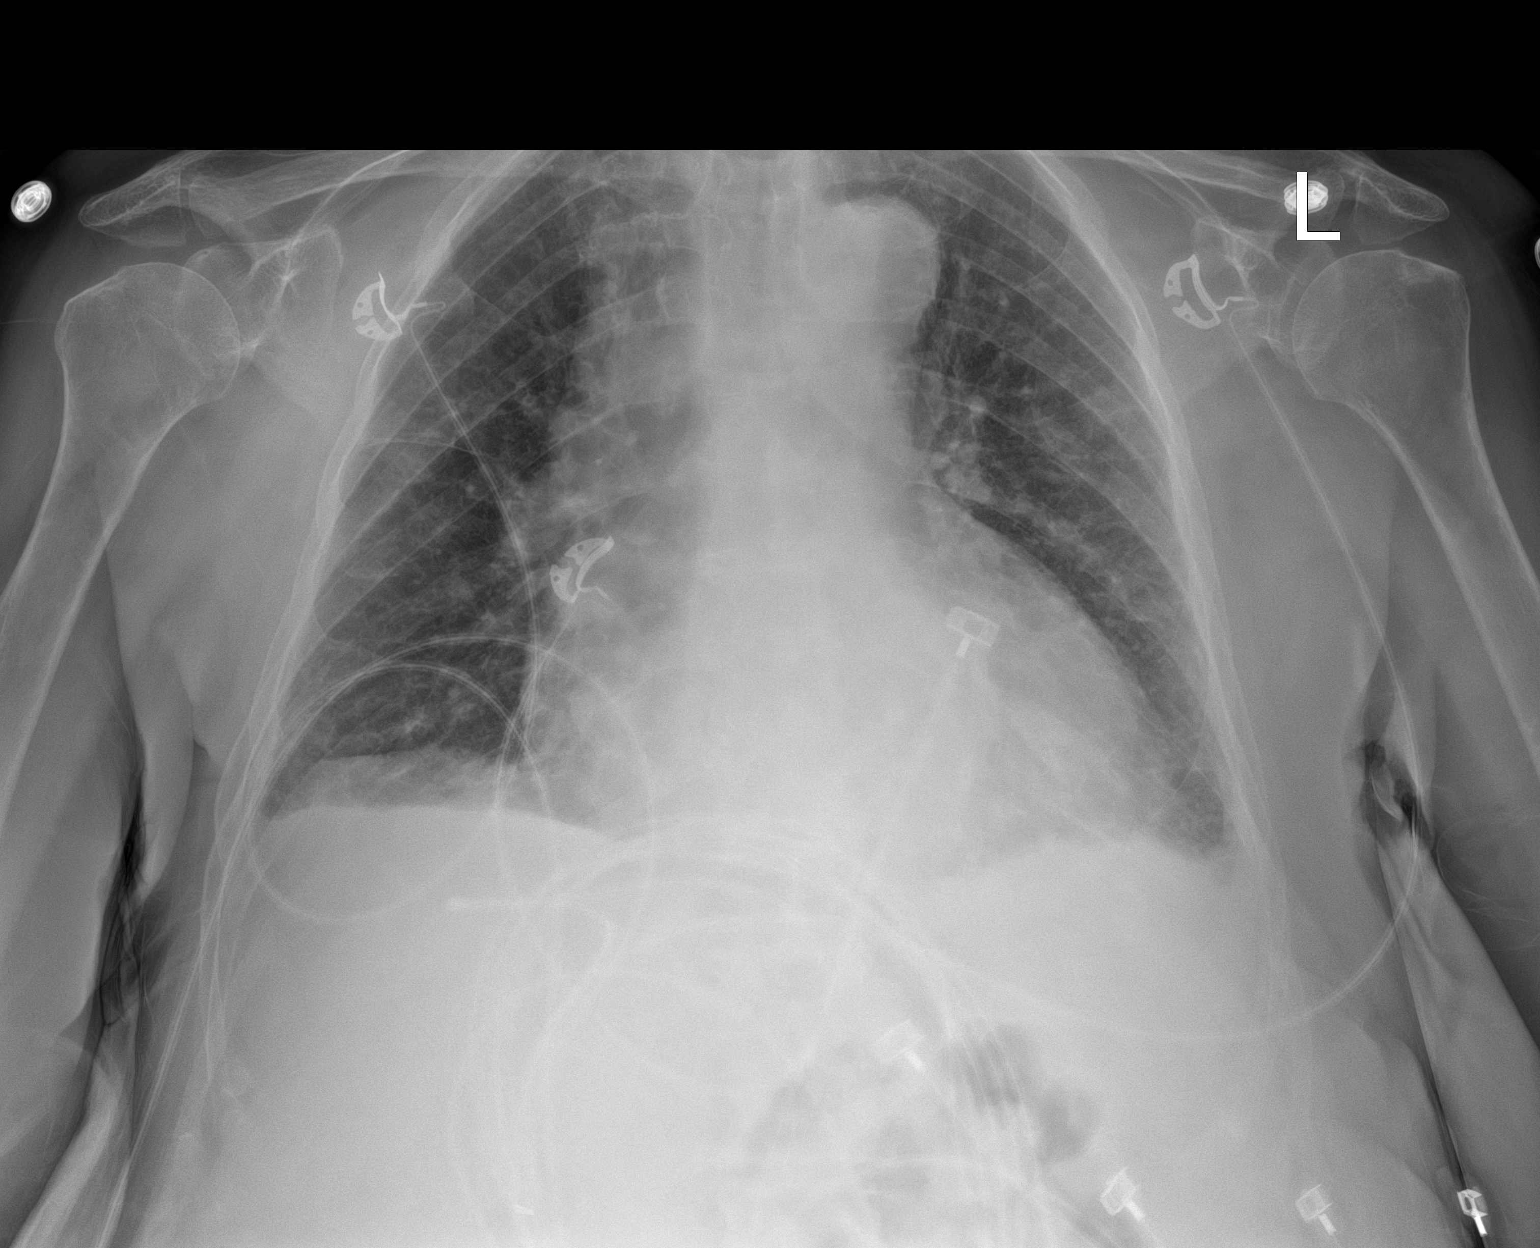

[1 of 1 positions shown; findings below may reference images not displayed]

FINDINGS: Stable cardiomegaly and a calcified, tortuous aorta.

Increasing interstitial opacities with septal lines and cephalized,
indistinct pulmonary vascularity. Mild central vascular cuffing.
There is patchy retrocardiac opacity in the left lung base. Small
bilateral pleural effusions are noted. Biapical pleuroparenchymal
scarring is seen.

Degenerative changes are present in the imaged spine and shoulders.
IMPRESSION: 1. Stable cardiomegaly with increasing interstitial pulmonary edema.
2. Small bilateral pleural effusions.
3. More focal opacity in the left lung base could reflect
atelectasis or consolidation.
4.  Aortic Atherosclerosis ([id number]).

## 2021-11-01 ENCOUNTER — Other Ambulatory Visit (HOSPITAL_COMMUNITY): Payer: Self-pay | Admitting: Hematology

## 2021-11-01 ENCOUNTER — Inpatient Hospital Stay (HOSPITAL_BASED_OUTPATIENT_CLINIC_OR_DEPARTMENT_OTHER): Payer: Medicare HMO | Admitting: Hematology

## 2021-11-01 ENCOUNTER — Other Ambulatory Visit (HOSPITAL_COMMUNITY): Payer: Self-pay | Admitting: *Deleted

## 2021-11-01 VITALS — BP 112/76 | HR 65 | Temp 98.4°F | Resp 18 | Ht 62.0 in | Wt 166.2 lb

## 2021-11-01 DIAGNOSIS — Z809 Family history of malignant neoplasm, unspecified: Secondary | ICD-10-CM | POA: Diagnosis not present

## 2021-11-01 DIAGNOSIS — C50911 Malignant neoplasm of unspecified site of right female breast: Secondary | ICD-10-CM

## 2021-11-01 DIAGNOSIS — Z87891 Personal history of nicotine dependence: Secondary | ICD-10-CM | POA: Diagnosis not present

## 2021-11-01 DIAGNOSIS — Z1231 Encounter for screening mammogram for malignant neoplasm of breast: Secondary | ICD-10-CM

## 2021-11-01 DIAGNOSIS — C9110 Chronic lymphocytic leukemia of B-cell type not having achieved remission: Secondary | ICD-10-CM | POA: Diagnosis not present

## 2021-11-01 DIAGNOSIS — Z853 Personal history of malignant neoplasm of breast: Secondary | ICD-10-CM | POA: Diagnosis not present

## 2021-11-01 DIAGNOSIS — E559 Vitamin D deficiency, unspecified: Secondary | ICD-10-CM | POA: Diagnosis not present

## 2021-11-01 DIAGNOSIS — J90 Pleural effusion, not elsewhere classified: Secondary | ICD-10-CM | POA: Diagnosis not present

## 2021-11-01 DIAGNOSIS — I4891 Unspecified atrial fibrillation: Secondary | ICD-10-CM | POA: Diagnosis not present

## 2021-11-01 MED ORDER — MISC. DEVICES MISC: 0 refills | Status: DC

## 2021-11-01 NOTE — Progress Notes (Signed)
[name], [name] [address]   CLINIC:  Medical Oncology/Hematology  PCP:  Lemmie Evens, MD [place] [address] [place] [address]  [phone]  REASON FOR VISIT:  Follow-up for CLL  PRIOR THERAPY: none  CURRENT THERAPY: surveillance  INTERVAL HISTORY:  [name], a 86 y.o. female, returns for routine follow-up for her CLL. Nesha was last seen on 04/27/2021.  Today she reports feeling good. She denies fevers, night sweats, weight loss and infections. She reports hot flashes. She is currently taking 1000 units of vitamin D daily after switching form 5000 units in March.   REVIEW OF SYSTEMS:  Review of Systems  Constitutional:  Negative for appetite change, fatigue, fever and unexpected weight change.  Endocrine: Positive for hot flashes.  Genitourinary:  Negative for frequency.   All other systems reviewed and are negative.   PAST MEDICAL/SURGICAL HISTORY:  Past Medical History:  Diagnosis Date   Bowel obstruction ([place])    Breast cancer ([place])    Breast cancer, stage 3 ([place]) 07/21/2011   Bronchitis    CAD (coronary artery disease)    a. cath in 06/2020 showing mild to moderate nonobstructive CAD   CHF (congestive heart failure) ([place])    a. EF previously 55-60% in 06/2018 b. EF reduced to 40-45% by repeat echo in 03/2019 and at 35-40% by echo in 05/2020   CLL (chronic lymphocytic leukemia) ([place]) 07/21/2011   Migraines    PAF (paroxysmal atrial fibrillation) ([facility])    Pneumonia    Past Surgical History:  Procedure Laterality Date   ABDOMINAL HYSTERECTOMY     APPENDECTOMY     CHOLECYSTECTOMY     LAPAROTOMY N/A 06/08/2015   Procedure: EXPLORATORY LAPAROTOMY;  Surgeon: Aviva Signs, MD;  Location: [facility];  Service: General;  Laterality: N/A;   LYSIS OF ADHESION N/A 06/08/2015   Procedure: LYSIS OF ADHESION;  Surgeon: Aviva Signs, MD;  Location: [facility];  Service: General;  Laterality: N/A;   MASTECTOMY     right    RIGHT/LEFT HEART CATH AND CORONARY ANGIOGRAPHY N/A 07/09/2020   Procedure: RIGHT/LEFT HEART CATH AND CORONARY ANGIOGRAPHY;  Surgeon: Nelva Bush, MD;  Location: [facility];  Service: Cardiovascular;  Laterality: N/A;   TOTAL HIP ARTHROPLASTY     right    SOCIAL HISTORY:  Social History   Socioeconomic History   Marital status: Widowed    Spouse name: Not on file   Number of children: Not on file   Years of education: 11   Highest education level: Not on file  Occupational History   Not on file  Tobacco Use   Smoking status: Former    Types: Cigarettes    Quit date: 10/26/1978    Years since quitting: 43.0   Smokeless tobacco: Never  Vaping Use   Vaping Use: Never used  Substance and Sexual Activity   Alcohol use: No   Drug use: No   Sexual activity: Not Currently  Other Topics Concern   Not on file  Social History Narrative   Pt lives by herself   Social Determinants of Health   Financial Resource Strain: Low Risk  (04/09/2020)   Overall Financial Resource Strain (CARDIA)    Difficulty of Paying Living Expenses: Not hard at all  Food Insecurity: No Food Insecurity (04/09/2020)   Hunger Vital Sign    Worried About Running Out of Food in the Last Year: Never true    Ran Out  of Food in the Last Year: Never true  Transportation Needs: No Transportation Needs (04/09/2020)   PRAPARE - Hydrologist (Medical): No    Lack of Transportation (Non-Medical): No  Physical Activity: Inactive (04/09/2020)   Exercise Vital Sign    Days of Exercise per Week: 0 days    Minutes of Exercise per Session: 0 min  Stress: Stress Concern Present (06/27/2018)   [place]    Feeling of Stress : Rather much  Social Connections: Socially Isolated (04/09/2020)   Social Connection and Isolation Panel [NHANES]    Frequency of Communication with Friends and Family: More than three times a week     Frequency of Social Gatherings with Friends and Family: Twice a week    Attends Religious Services: Never    Marine scientist or Organizations: No    Attends Archivist Meetings: Never    Marital Status: Widowed  Intimate Partner Violence: Not At Risk (04/09/2020)   Humiliation, Afraid, Rape, and Kick questionnaire    Fear of Current or Ex-Partner: No    Emotionally Abused: No    Physically Abused: No    Sexually Abused: No    FAMILY HISTORY:  Family History  Problem Relation Age of Onset   Cancer Brother    Cancer Other     CURRENT MEDICATIONS:  Current Outpatient Medications  Medication Sig Dispense Refill   acetaminophen (TYLENOL) 500 MG tablet Take 1,000 mg by mouth every 6 (six) hours as needed for moderate pain or headache.     albuterol (PROVENTIL HFA;VENTOLIN HFA) 108 (90 BASE) MCG/ACT inhaler Inhale 2 puffs into the lungs every 6 (six) hours as needed for shortness of breath.     albuterol (PROVENTIL) (2.5 MG/3ML) 0.083% nebulizer solution Take 3 mLs (2.5 mg total) by nebulization every 6 (six) hours as needed for wheezing or shortness of breath (when not relieved by inhaler.). 75 mL 12   apixaban (ELIQUIS) 5 MG TABS tablet TAKE 1 TABLET BY MOUTH 2 TIMES DAILY. 180 tablet 1   atorvastatin (LIPITOR) 20 MG tablet TAKE ONE TABLET BY MOUTH ONCE DAILY. 90 tablet 2   bisoprolol (ZEBETA) 5 MG tablet TAKE (1/2) TABLET BY MOUTH ONCE DAILY. 30 tablet 6   Capsaicin-Menthol (SALONPAS GEL EX) Apply topically.     cholecalciferol (VITAMIN D3) 25 MCG (1000 UNIT) tablet Take 1 capsule by mouth daily.     diphenhydrAMINE (BENADRYL) 25 MG tablet Take 25 mg by mouth daily as needed for allergies.     losartan (COZAAR) 25 MG tablet Take 0.5 tablets (12.5 mg total) by mouth daily. 45 tablet 3   omeprazole (PRILOSEC) 20 MG capsule Take 20 mg by mouth daily.     ondansetron (ZOFRAN) 4 MG tablet Take 1 tablet (4 mg total) by mouth every 8 (eight) hours as needed for nausea or  vomiting. 20 tablet 0   spironolactone (ALDACTONE) 25 MG tablet TAKE (1) TABLET BY MOUTH DAILY. 90 tablet 1   No current facility-administered medications for this visit.    ALLERGIES:  Allergies  Allergen Reactions   Codeine Nausea And Vomiting    headache   Losartan Nausea Only    Nauseated and headaches   Meclizine Other (See Comments)    Made dizziness worse.   Sulfa Antibiotics Nausea And Vomiting    Stomach upset   Zoloft [Sertraline Hcl] Anxiety    PHYSICAL EXAM:  Performance status (ECOG): 1 -  Symptomatic but completely ambulatory  There were no vitals filed for this visit. Wt Readings from Last 3 Encounters:  09/29/21 168 lb (76.2 kg)  05/31/21 163 lb 3.2 oz (74 kg)  05/13/21 160 lb (72.6 kg)   Physical Exam Vitals reviewed.  Constitutional:      Appearance: Normal appearance.  Cardiovascular:     Rate and Rhythm: Normal rate and regular rhythm.     Pulses: Normal pulses.     Heart sounds: Normal heart sounds.  Pulmonary:     Effort: Pulmonary effort is normal.     Breath sounds: Normal breath sounds.  Lymphadenopathy:     Cervical: No cervical adenopathy.     Right cervical: No superficial cervical adenopathy.    Left cervical: No superficial cervical adenopathy.     Upper Body:     Right upper body: No supraclavicular or axillary adenopathy.     Left upper body: No supraclavicular or axillary adenopathy.  Neurological:     General: No focal deficit present.     Mental Status: She is alert and oriented to person, place, and time.  Psychiatric:        Mood and Affect: Mood normal.        Behavior: Behavior normal.     LABORATORY DATA:  I have reviewed the labs as listed.     Latest Ref Rng & Units 10/25/2021    1:43 PM 05/13/2021    5:25 PM 04/20/2021    1:11 PM  CBC  WBC 4.0 - 10.5 K/uL 36.9  29.0  34.8   Hemoglobin 12.0 - 15.0 g/dL 11.8  13.6  13.3   Hematocrit 36.0 - 46.0 % 36.7  42.0  39.7   Platelets 150 - 400 K/uL 190  177  205        Latest Ref Rng & Units 10/25/2021    1:43 PM 05/13/2021    5:25 PM 04/20/2021    1:34 PM  CMP  Glucose 70 - 99 mg/dL 171  128  125   BUN 8 - 23 mg/dL 34  26  20   Creatinine 0.44 - 1.00 mg/dL 0.84  1.03  0.90   Sodium 135 - 145 mmol/L 138  141  138   Potassium 3.5 - 5.1 mmol/L 4.2  3.9  4.1   Chloride 98 - 111 mmol/L 109  104  109   CO2 22 - 32 mmol/L '22  23  19   '$ Calcium 8.9 - 10.3 mg/dL 9.0  9.2  9.7   Total Protein 6.5 - 8.1 g/dL 6.6     Total Bilirubin 0.3 - 1.2 mg/dL 0.7     Alkaline Phos 38 - 126 U/L 39     AST 15 - 41 U/L 22     ALT 0 - 44 U/L 15         Component Value Date/Time   RBC 3.89 10/25/2021 1343   MCV 94.3 10/25/2021 1343   MCV 88.9 10/12/2012 1038   MCH 30.3 10/25/2021 1343   MCHC 32.2 10/25/2021 1343   RDW 14.2 10/25/2021 1343   RDW 14.4 10/12/2012 1038   LYMPHSABS 30.6 (H) 10/25/2021 1343   LYMPHSABS 57.9 (H) 10/12/2012 1038   MONOABS 0.9 10/25/2021 1343   MONOABS 1.2 (H) 10/12/2012 1038   EOSABS 0.2 10/25/2021 1343   EOSABS 0.4 10/12/2012 1038   BASOSABS 0.1 10/25/2021 1343   BASOSABS 0.3 (H) 10/12/2012 1038    DIAGNOSTIC IMAGING:  I have independently reviewed the scans  and discussed with the patient. No results found.   ASSESSMENT:  1.  CLL: -Patient was diagnosed 25+ years ago.  Has never required treatment. -Patient reports ongoing hot flashes and night sweats.  She denies any adenopathy.  No other B symptoms. -On 03/26/2019 patient had a CT scan of her chest which revealed a 17 mm irregular soft tissue density along with the medial aspect of the left lung base. -CT of chest on 04/23/2019 showed interval resolution of previously demonstrated small pleural effusions.  No suspicious pulmonary nodules.     2.  Stage III right breast cancer: -Patient was diagnosed in 1980 /1999. -Treated with neoadjuvant chemotherapy followed by right mastectomy then XRT.   3.  Atrial fibrillation: -Recently referred to atrial fibrillation clinic secondary to  palpitations mainly at night time. -Her bisoprolol was increased from 5 mg to 7.5 mg daily.   PLAN:   1. CLL: - She denies any fevers, night sweats or weight loss in the last 6 months. - She does not report any infections. - No palpable neck, supraclavicular or axillary adenopathy. - Reviewed labs from 10/25/2021.  White count is 36.9 with predominantly lymphocytosis.  Hemoglobin 11.8 and normal platelet count.  LDH and LFTs are normal. - No indication for treatment at this time.  RTC 6 months with repeat labs.     2.  Stage III right breast cancer: - Left breast mammogram on 05/19/2021 was BI-RADS Category 1. - She will have mammogram repeated in December. - We have given prescription for prosthesis and mastectomy bras.     3.   Vitamin D deficiency: - Vitamin D dose was reduced to 1000 units daily in March. - Vitamin D level is 34.6.  Continue vitamin D 1000 units daily.    Orders placed this encounter:  No orders of the defined types were placed in this encounter.    Derek Jack, MD [place] [phone]   I, Thana Ates, am acting as a scribe for Dr. Derek Jack.  I, Derek Jack MD, have reviewed the above documentation for accuracy and completeness, and I agree with the above.

## 2021-11-01 NOTE — Patient Instructions (Addendum)
[place] at [facility] Discharge Instructions  You were seen and examined today by Dr. Delton Coombes.  Dr. Delton Coombes discussed your most recent lab work and everything looks good.  Continue taking Vitamin D 1000 mg daily.   Follow-up as scheduled in 6 months.    Thank you for choosing [place] at [facility] to provide your oncology and hematology care.  To afford each patient quality time with our provider, please arrive at least 15 minutes before your scheduled appointment time.   If you have a lab appointment with the [facility] please come in thru the Main Entrance and check in at the main information desk.  You need to re-schedule your appointment should you arrive 10 or more minutes late.  We strive to give you quality time with our providers, and arriving late affects you and other patients whose appointments are after yours.  Also, if you no show three or more times for appointments you may be dismissed from the clinic at the providers discretion.     Again, thank you for choosing [facility].  Our hope is that these requests will decrease the amount of time that you wait before being seen by our physicians.       _____________________________________________________________  Should you have questions after your visit to [facility], please contact our office at [phone] and follow the prompts.  Our office hours are 8:00 a.m. and 4:30 p.m. Monday - Friday.  Please note that voicemails left after 4:00 p.m. may not be returned until the following business day.  We are closed weekends and major holidays.  You do have access to a nurse 24-7, just call the main number to the clinic [phone] and do not press any options, hold on the line and a nurse will answer the phone.    For prescription refill requests, have your pharmacy contact our office and allow 72 hours.    Due to Covid, you will need to wear  a mask upon entering the hospital. If you do not have a mask, a mask will be given to you at the Main Entrance upon arrival. For doctor visits, patients may have 1 support person age 4 or older with them. For treatment visits, patients can not have anyone with them due to social distancing guidelines and our immunocompromised population.

## 2021-11-15 ENCOUNTER — Other Ambulatory Visit (HOSPITAL_COMMUNITY)
Admission: RE | Admit: 2021-11-15 | Discharge: 2021-11-15 | Disposition: A | Discharge status: Home / Self Care | Payer: Medicare HMO | Attending: Nurse Practitioner | Admitting: Nurse Practitioner

## 2021-11-15 DIAGNOSIS — E6609 Other obesity due to excess calories: Secondary | ICD-10-CM | POA: Diagnosis not present

## 2021-11-15 DIAGNOSIS — I2510 Atherosclerotic heart disease of native coronary artery without angina pectoris: Secondary | ICD-10-CM | POA: Insufficient documentation

## 2021-11-15 DIAGNOSIS — R739 Hyperglycemia, unspecified: Secondary | ICD-10-CM | POA: Insufficient documentation

## 2021-11-15 DIAGNOSIS — Z6830 Body mass index (BMI) 30.0-30.9, adult: Secondary | ICD-10-CM | POA: Diagnosis not present

## 2021-11-15 DIAGNOSIS — R7303 Prediabetes: Secondary | ICD-10-CM | POA: Diagnosis not present

## 2021-11-15 DIAGNOSIS — M25511 Pain in right shoulder: Secondary | ICD-10-CM | POA: Diagnosis not present

## 2021-11-15 DIAGNOSIS — Z7901 Long term (current) use of anticoagulants: Secondary | ICD-10-CM | POA: Diagnosis not present

## 2021-11-15 DIAGNOSIS — C9111 Chronic lymphocytic leukemia of B-cell type in remission: Secondary | ICD-10-CM | POA: Diagnosis not present

## 2021-11-15 LAB — CBC
HCT: 36.3 % (ref 36.0–46.0)
Hemoglobin: 11.9 g/dL — ABNORMAL LOW (ref 12.0–15.0)
MCH: 31.6 pg (ref 26.0–34.0)
MCHC: 32.8 g/dL (ref 30.0–36.0)
MCV: 96.3 fL (ref 80.0–100.0)
Platelets: 202 10*3/uL (ref 150–400)
RBC: 3.77 MIL/uL — ABNORMAL LOW (ref 3.87–5.11)
RDW: 14.5 % (ref 11.5–15.5)
WBC: 33.2 10*3/uL — ABNORMAL HIGH (ref 4.0–10.5)
nRBC: 0 % (ref 0.0–0.2)

## 2021-11-15 LAB — LIPID PANEL
Cholesterol: 107 mg/dL (ref 0–200)
HDL: 43 mg/dL (ref >40)
LDL Cholesterol: 49 mg/dL (ref 0–99)
Total CHOL/HDL Ratio: 2.5 RATIO
Triglycerides: 75 mg/dL (ref <150)
VLDL: 15 mg/dL (ref 0–40)

## 2021-11-15 LAB — COMPREHENSIVE METABOLIC PANEL
ALT: 15 U/L (ref 0–44)
AST: 20 U/L (ref 15–41)
Albumin: 4 g/dL (ref 3.5–5.0)
Alkaline Phosphatase: 44 U/L (ref 38–126)
Anion gap: 8 (ref 5–15)
BUN: 25 mg/dL — ABNORMAL HIGH (ref 8–23)
CO2: 22 mmol/L (ref 22–32)
Calcium: 9.4 mg/dL (ref 8.9–10.3)
Chloride: 110 mmol/L (ref 98–111)
Creatinine, Ser: 0.88 mg/dL (ref 0.44–1.00)
GFR, Estimated: >60 mL/min (ref >60)
Glucose, Bld: 117 mg/dL — ABNORMAL HIGH (ref 70–99)
Potassium: 4.4 mmol/L (ref 3.5–5.1)
Sodium: 140 mmol/L (ref 135–145)
Total Bilirubin: 1 mg/dL (ref 0.3–1.2)
Total Protein: 6.9 g/dL (ref 6.5–8.1)

## 2021-11-15 LAB — VITAMIN D 25 HYDROXY (VIT D DEFICIENCY, FRACTURES): Vit D, 25-Hydroxy: 46.57 ng/mL (ref 30–100)

## 2021-11-15 LAB — TSH: TSH: 2.056 u[IU]/mL (ref 0.350–4.500)

## 2021-11-16 LAB — HEMOGLOBIN A1C
Hgb A1c MFr Bld: 5.7 % — ABNORMAL HIGH (ref 4.8–5.6)
Mean Plasma Glucose: 117 mg/dL

## 2021-12-23 ENCOUNTER — Other Ambulatory Visit: Payer: Self-pay | Admitting: Internal Medicine

## 2021-12-23 DIAGNOSIS — I480 Paroxysmal atrial fibrillation: Secondary | ICD-10-CM

## 2021-12-23 NOTE — Telephone Encounter (Signed)
Prescription refill request for Eliquis received. Indication: PAF Last office visit: 09/29/21  Lizbeth Bark MD Scr: 0.88 on 11/15/21 Age: 86 Weight: 76.2kg  Based on above findings Eliquis '5mg'$  twice daily is the appropriate dose.  Refill approved.

## 2022-01-26 ENCOUNTER — Other Ambulatory Visit: Payer: Self-pay | Admitting: Physician Assistant

## 2022-01-26 ENCOUNTER — Other Ambulatory Visit: Payer: Self-pay | Admitting: Internal Medicine

## 2022-02-18 DIAGNOSIS — R739 Hyperglycemia, unspecified: Secondary | ICD-10-CM | POA: Diagnosis not present

## 2022-02-18 DIAGNOSIS — M25511 Pain in right shoulder: Secondary | ICD-10-CM | POA: Diagnosis not present

## 2022-02-18 DIAGNOSIS — C9111 Chronic lymphocytic leukemia of B-cell type in remission: Secondary | ICD-10-CM | POA: Diagnosis not present

## 2022-02-18 DIAGNOSIS — I2510 Atherosclerotic heart disease of native coronary artery without angina pectoris: Secondary | ICD-10-CM | POA: Diagnosis not present

## 2022-04-26 ENCOUNTER — Inpatient Hospital Stay: Payer: Medicare HMO | Attending: Hematology

## 2022-04-26 DIAGNOSIS — Z853 Personal history of malignant neoplasm of breast: Secondary | ICD-10-CM | POA: Diagnosis not present

## 2022-04-26 DIAGNOSIS — Z7901 Long term (current) use of anticoagulants: Secondary | ICD-10-CM | POA: Diagnosis not present

## 2022-04-26 DIAGNOSIS — C50911 Malignant neoplasm of unspecified site of right female breast: Secondary | ICD-10-CM

## 2022-04-26 DIAGNOSIS — Z87891 Personal history of nicotine dependence: Secondary | ICD-10-CM | POA: Diagnosis not present

## 2022-04-26 DIAGNOSIS — I480 Paroxysmal atrial fibrillation: Secondary | ICD-10-CM | POA: Diagnosis not present

## 2022-04-26 DIAGNOSIS — Z803 Family history of malignant neoplasm of breast: Secondary | ICD-10-CM | POA: Diagnosis not present

## 2022-04-26 DIAGNOSIS — C9110 Chronic lymphocytic leukemia of B-cell type not having achieved remission: Secondary | ICD-10-CM | POA: Diagnosis not present

## 2022-04-26 DIAGNOSIS — E559 Vitamin D deficiency, unspecified: Secondary | ICD-10-CM | POA: Diagnosis not present

## 2022-04-26 DIAGNOSIS — Z809 Family history of malignant neoplasm, unspecified: Secondary | ICD-10-CM | POA: Diagnosis not present

## 2022-04-26 LAB — CBC WITH DIFFERENTIAL/PLATELET
Abs Immature Granulocytes: 0 10*3/uL (ref 0.00–0.07)
Basophils Absolute: 0 10*3/uL (ref 0.0–0.1)
Basophils Relative: 0 %
Eosinophils Absolute: 0 10*3/uL (ref 0.0–0.5)
Eosinophils Relative: 0 %
HCT: 36.8 % (ref 36.0–46.0)
Hemoglobin: 11.8 g/dL — ABNORMAL LOW (ref 12.0–15.0)
Lymphocytes Relative: 87 %
Lymphs Abs: 28.9 10*3/uL — ABNORMAL HIGH (ref 0.7–4.0)
MCH: 30.9 pg (ref 26.0–34.0)
MCHC: 32.1 g/dL (ref 30.0–36.0)
MCV: 96.3 fL (ref 80.0–100.0)
Monocytes Absolute: 1.3 10*3/uL — ABNORMAL HIGH (ref 0.1–1.0)
Monocytes Relative: 4 %
Neutro Abs: 3 10*3/uL (ref 1.7–7.7)
Neutrophils Relative %: 9 %
Platelets: 193 10*3/uL (ref 150–400)
RBC: 3.82 MIL/uL — ABNORMAL LOW (ref 3.87–5.11)
RDW: 13.6 % (ref 11.5–15.5)
WBC: 33.2 10*3/uL — ABNORMAL HIGH (ref 4.0–10.5)
nRBC: 0 % (ref 0.0–0.2)

## 2022-04-26 LAB — COMPREHENSIVE METABOLIC PANEL
ALT: 13 U/L (ref 0–44)
AST: 22 U/L (ref 15–41)
Albumin: 3.9 g/dL (ref 3.5–5.0)
Alkaline Phosphatase: 49 U/L (ref 38–126)
Anion gap: 8 (ref 5–15)
BUN: 18 mg/dL (ref 8–23)
CO2: 23 mmol/L (ref 22–32)
Calcium: 8.8 mg/dL — ABNORMAL LOW (ref 8.9–10.3)
Chloride: 107 mmol/L (ref 98–111)
Creatinine, Ser: 0.97 mg/dL (ref 0.44–1.00)
GFR, Estimated: 58 mL/min — ABNORMAL LOW (ref >60)
Glucose, Bld: 109 mg/dL — ABNORMAL HIGH (ref 70–99)
Potassium: 4.6 mmol/L (ref 3.5–5.1)
Sodium: 138 mmol/L (ref 135–145)
Total Bilirubin: 0.4 mg/dL (ref 0.3–1.2)
Total Protein: 6.6 g/dL (ref 6.5–8.1)

## 2022-04-26 LAB — VITAMIN D 25 HYDROXY (VIT D DEFICIENCY, FRACTURES): Vit D, 25-Hydroxy: 37.75 ng/mL (ref 30–100)

## 2022-04-26 LAB — LACTATE DEHYDROGENASE: LDH: 130 U/L (ref 98–192)

## 2022-05-03 ENCOUNTER — Inpatient Hospital Stay: Payer: Medicare HMO | Admitting: Hematology

## 2022-05-03 ENCOUNTER — Encounter: Payer: Self-pay | Admitting: Hematology

## 2022-05-03 VITALS — BP 108/63 | HR 61 | Temp 98.4°F | Resp 18 | Wt 163.6 lb

## 2022-05-03 DIAGNOSIS — E559 Vitamin D deficiency, unspecified: Secondary | ICD-10-CM

## 2022-05-03 DIAGNOSIS — C50911 Malignant neoplasm of unspecified site of right female breast: Secondary | ICD-10-CM

## 2022-05-03 DIAGNOSIS — Z803 Family history of malignant neoplasm of breast: Secondary | ICD-10-CM | POA: Diagnosis not present

## 2022-05-03 DIAGNOSIS — Z7901 Long term (current) use of anticoagulants: Secondary | ICD-10-CM | POA: Diagnosis not present

## 2022-05-03 DIAGNOSIS — Z809 Family history of malignant neoplasm, unspecified: Secondary | ICD-10-CM | POA: Diagnosis not present

## 2022-05-03 DIAGNOSIS — Z853 Personal history of malignant neoplasm of breast: Secondary | ICD-10-CM | POA: Diagnosis not present

## 2022-05-03 DIAGNOSIS — I480 Paroxysmal atrial fibrillation: Secondary | ICD-10-CM | POA: Diagnosis not present

## 2022-05-03 DIAGNOSIS — Z87891 Personal history of nicotine dependence: Secondary | ICD-10-CM | POA: Diagnosis not present

## 2022-05-03 DIAGNOSIS — C9110 Chronic lymphocytic leukemia of B-cell type not having achieved remission: Secondary | ICD-10-CM

## 2022-05-03 NOTE — Patient Instructions (Addendum)
[place]  Discharge Instructions  You were seen and examined today by Dr. Delton Coombes.  Dr. Delton Coombes discussed your most recent lab work which revealed that everything looks good and stable.  Continue taking Vitamin D.  Follow-up as scheduled in 6 months.    Thank you for choosing [facility] to provide your oncology and hematology care.   To afford each patient quality time with our provider, please arrive at least 15 minutes before your scheduled appointment time. You may need to reschedule your appointment if you arrive late (10 or more minutes). Arriving late affects you and other patients whose appointments are after yours.  Also, if you miss three or more appointments without notifying the office, you may be dismissed from the clinic at the provider's discretion.    Again, thank you for choosing [facility].  Our hope is that these requests will decrease the amount of time that you wait before being seen by our physicians.   If you have a lab appointment with the [facility] please come in thru the Main Entrance and check in at the main information desk.           _____________________________________________________________  Should you have questions after your visit to [facility], please contact our office at [phone] and follow the prompts.  Our office hours are 8:00 a.m. to 4:30 p.m. Monday - Thursday and 8:00 a.m. to 2:30 p.m. Friday.  Please note that voicemails left after 4:00 p.m. may not be returned until the following business day.  We are closed weekends and all major holidays.  You do have access to a nurse 24-7, just call the main number to the clinic [phone] and do not press any options, hold on the line and a nurse will answer the phone.    For prescription refill requests, have your pharmacy contact our office and allow 72 hours.    Masks are optional in the cancer  centers. If you would like for your care team to wear a mask while they are taking care of you, please let them know. You may have one support person who is at least 84 years old accompany you for your appointments.

## 2022-05-03 NOTE — Progress Notes (Signed)
[name], [place] [address]   CLINIC:  Medical Oncology/Hematology  PCP:  Lemmie Evens, MD [place] / [place] [address]  [phone]  REASON FOR VISIT:  Follow-up for CLL  PRIOR THERAPY: none  CURRENT THERAPY: surveillance  INTERVAL HISTORY:  [name], a 86 y.o. female, seen for follow-up of CLL.  She reports energy levels 25%.  Denies any fevers, night sweats or weight loss.  Shortness of breath and palpitations have been stable.  REVIEW OF SYSTEMS:  Review of Systems  Respiratory:  Positive for shortness of breath.   Cardiovascular:  Positive for palpitations.  Gastrointestinal:  Positive for nausea (chrnic on/off).  Genitourinary:  Negative for frequency.   All other systems reviewed and are negative.   PAST MEDICAL/SURGICAL HISTORY:  Past Medical History:  Diagnosis Date   Bowel obstruction ([facility])    Breast cancer ([facility])    Breast cancer, stage 3 ([facility]) 07/21/2011   Bronchitis    CAD (coronary artery disease)    a. cath in 06/2020 showing mild to moderate nonobstructive CAD   CHF (congestive heart failure) ([facility])    a. EF previously 55-60% in 06/2018 b. EF reduced to 40-45% by repeat echo in 03/2019 and at 35-40% by echo in 05/2020   CLL (chronic lymphocytic leukemia) ([facility]) 07/21/2011   Migraines    PAF (paroxysmal atrial fibrillation) ([facility])    Pneumonia    Past Surgical History:  Procedure Laterality Date   ABDOMINAL HYSTERECTOMY     APPENDECTOMY     CHOLECYSTECTOMY     LAPAROTOMY N/A 06/08/2015   Procedure: EXPLORATORY LAPAROTOMY;  Surgeon: Aviva Signs, MD;  Location: [facility];  Service: General;  Laterality: N/A;   LYSIS OF ADHESION N/A 06/08/2015   Procedure: LYSIS OF ADHESION;  Surgeon: Aviva Signs, MD;  Location: [facility];  Service: General;  Laterality: N/A;   MASTECTOMY     right   RIGHT/LEFT HEART CATH AND CORONARY ANGIOGRAPHY N/A 07/09/2020   Procedure: RIGHT/LEFT HEART CATH AND CORONARY  ANGIOGRAPHY;  Surgeon: Nelva Bush, MD;  Location: [facility];  Service: Cardiovascular;  Laterality: N/A;   TOTAL HIP ARTHROPLASTY     right    SOCIAL HISTORY:  Social History   Socioeconomic History   Marital status: Widowed    Spouse name: Not on file   Number of children: Not on file   Years of education: 11   Highest education level: Not on file  Occupational History   Not on file  Tobacco Use   Smoking status: Former    Types: Cigarettes    Quit date: 10/26/1978    Years since quitting: 43.5   Smokeless tobacco: Never  Vaping Use   Vaping Use: Never used  Substance and Sexual Activity   Alcohol use: No   Drug use: No   Sexual activity: Not Currently  Other Topics Concern   Not on file  Social History Narrative   Pt lives by herself   Social Determinants of Health   Financial Resource Strain: Low Risk  (04/09/2020)   Overall Financial Resource Strain (CARDIA)    Difficulty of Paying Living Expenses: Not hard at all  Food Insecurity: No Food Insecurity (04/09/2020)   Hunger Vital Sign    Worried About Running Out of Food in the Last Year: Never true    [place] in the Last Year: Never true  Transportation Needs: No Transportation Needs (04/09/2020)  PRAPARE - Hydrologist (Medical): No    Lack of Transportation (Non-Medical): No  Physical Activity: Inactive (04/09/2020)   Exercise Vital Sign    Days of Exercise per Week: 0 days    Minutes of Exercise per Session: 0 min  Stress: Stress Concern Present (06/27/2018)   [place]    Feeling of Stress : Rather much  Social Connections: Socially Isolated (04/09/2020)   Social Connection and Isolation Panel [NHANES]    Frequency of Communication with Friends and Family: More than three times a week    Frequency of Social Gatherings with Friends and Family: Twice a week    Attends Religious Services:  Never    Marine scientist or Organizations: No    Attends Archivist Meetings: Never    Marital Status: Widowed  Intimate Partner Violence: Not At Risk (04/09/2020)   Humiliation, Afraid, Rape, and Kick questionnaire    Fear of Current or Ex-Partner: No    Emotionally Abused: No    Physically Abused: No    Sexually Abused: No    FAMILY HISTORY:  Family History  Problem Relation Age of Onset   Cancer Brother    Cancer Other     CURRENT MEDICATIONS:  Current Outpatient Medications  Medication Sig Dispense Refill   acetaminophen (TYLENOL) 500 MG tablet Take 1,000 mg by mouth every 6 (six) hours as needed for moderate pain or headache.     albuterol (PROVENTIL HFA;VENTOLIN HFA) 108 (90 BASE) MCG/ACT inhaler Inhale 2 puffs into the lungs every 6 (six) hours as needed for shortness of breath.     albuterol (PROVENTIL) (2.5 MG/3ML) 0.083% nebulizer solution Take 3 mLs (2.5 mg total) by nebulization every 6 (six) hours as needed for wheezing or shortness of breath (when not relieved by inhaler.). 75 mL 12   apixaban (ELIQUIS) 5 MG TABS tablet TAKE 1 TABLET BY MOUTH 2 TIMES DAILY. 180 tablet 1   atorvastatin (LIPITOR) 20 MG tablet TAKE ONE TABLET BY MOUTH ONCE DAILY. 90 tablet 0   bisoprolol (ZEBETA) 5 MG tablet TAKE (1/2) TABLET BY MOUTH ONCE DAILY. 30 tablet 6   Capsaicin-Menthol (SALONPAS GEL EX) Apply topically.     cholecalciferol (VITAMIN D3) 25 MCG (1000 UNIT) tablet Take 1 capsule by mouth daily.     diphenhydrAMINE (BENADRYL) 25 MG tablet Take 25 mg by mouth daily as needed for allergies.     losartan (COZAAR) 25 MG tablet TAKE (1/2) TABLET BY MOUTH DAILY 45 tablet 0   Misc. 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 Dx: C50.911 1 each 0   omeprazole (PRILOSEC) 20 MG capsule Take 20 mg by mouth daily.     ondansetron (ZOFRAN) 4 MG tablet Take 1 tablet (4 mg total) by mouth every 8 (eight) hours as needed for nausea or vomiting. 20  tablet 0   spironolactone (ALDACTONE) 25 MG tablet TAKE (1) TABLET BY MOUTH DAILY. 90 tablet 3   VEOZAH 45 MG TABS Take 1 tablet by mouth daily.     No current facility-administered medications for this visit.    ALLERGIES:  Allergies  Allergen Reactions   Codeine Nausea And Vomiting    headache   Losartan Nausea Only    Nauseated and headaches   Meclizine Other (See Comments)    Made dizziness worse.   Sulfa Antibiotics Nausea And Vomiting    Stomach upset   Zoloft [Sertraline  Hcl] Anxiety    PHYSICAL EXAM:  Performance status (ECOG): 1 - Symptomatic but completely ambulatory  Vitals:   05/03/22 1404  BP: 108/63  Pulse: 61  Resp: 18  Temp: 98.4 F (36.9 C)  SpO2: 97%   Wt Readings from Last 3 Encounters:  05/03/22 163 lb 9.3 oz (74.2 kg)  11/01/21 166 lb 3.2 oz (75.4 kg)  09/29/21 168 lb (76.2 kg)   Physical Exam Vitals reviewed.  Constitutional:      Appearance: Normal appearance.  Cardiovascular:     Rate and Rhythm: Normal rate and regular rhythm.     Pulses: Normal pulses.     Heart sounds: Normal heart sounds.  Pulmonary:     Effort: Pulmonary effort is normal.     Breath sounds: Normal breath sounds.  Lymphadenopathy:     Cervical: No cervical adenopathy.     Right cervical: No superficial cervical adenopathy.    Left cervical: No superficial cervical adenopathy.     Upper Body:     Right upper body: No supraclavicular or axillary adenopathy.     Left upper body: No supraclavicular or axillary adenopathy.  Neurological:     General: No focal deficit present.     Mental Status: She is alert and oriented to person, place, and time.  Psychiatric:        Mood and Affect: Mood normal.        Behavior: Behavior normal.     LABORATORY DATA:  I have reviewed the labs as listed.     Latest Ref Rng & Units 04/26/2022   12:48 PM 11/15/2021   10:43 AM 10/25/2021    1:43 PM  CBC  WBC 4.0 - 10.5 K/uL 33.2  33.2  36.9   Hemoglobin 12.0 - 15.0 g/dL 11.8   11.9  11.8   Hematocrit 36.0 - 46.0 % 36.8  36.3  36.7   Platelets 150 - 400 K/uL 193  202  190       Latest Ref Rng & Units 04/26/2022   12:48 PM 11/15/2021   10:43 AM 10/25/2021    1:43 PM  CMP  Glucose 70 - 99 mg/dL 109  117  171   BUN 8 - 23 mg/dL 18  25  34   Creatinine 0.44 - 1.00 mg/dL 0.97  0.88  0.84   Sodium 135 - 145 mmol/L 138  140  138   Potassium 3.5 - 5.1 mmol/L 4.6  4.4  4.2   Chloride 98 - 111 mmol/L 107  110  109   CO2 22 - 32 mmol/L '23  22  22   '$ Calcium 8.9 - 10.3 mg/dL 8.8  9.4  9.0   Total Protein 6.5 - 8.1 g/dL 6.6  6.9  6.6   Total Bilirubin 0.3 - 1.2 mg/dL 0.4  1.0  0.7   Alkaline Phos 38 - 126 U/L 49  44  39   AST 15 - 41 U/L '22  20  22   '$ ALT 0 - 44 U/L '13  15  15       '$ Component Value Date/Time   RBC 3.82 (L) 04/26/2022 1248   MCV 96.3 04/26/2022 1248   MCV 88.9 10/12/2012 1038   MCH 30.9 04/26/2022 1248   MCHC 32.1 04/26/2022 1248   RDW 13.6 04/26/2022 1248   RDW 14.4 10/12/2012 1038   LYMPHSABS 28.9 (H) 04/26/2022 1248   LYMPHSABS 57.9 (H) 10/12/2012 1038   MONOABS 1.3 (H) 04/26/2022 1248   MONOABS 1.2 (  H) 10/12/2012 1038   EOSABS 0.0 04/26/2022 1248   EOSABS 0.4 10/12/2012 1038   BASOSABS 0.0 04/26/2022 1248   BASOSABS 0.3 (H) 10/12/2012 1038    DIAGNOSTIC IMAGING:  I have independently reviewed the scans and discussed with the patient. No results found.   ASSESSMENT:  1.  CLL: -Patient was diagnosed 25+ years ago.  Has never required treatment. -Patient reports ongoing hot flashes and night sweats.  She denies any adenopathy.  No other B symptoms. -On 03/26/2019 patient had a CT scan of her chest which revealed a 17 mm irregular soft tissue density along with the medial aspect of the left lung base. -CT of chest on 04/23/2019 showed interval resolution of previously demonstrated small pleural effusions.  No suspicious pulmonary nodules.     2.  Stage III right breast cancer: -Patient was diagnosed in 1980 /1999. -Treated with  neoadjuvant chemotherapy followed by right mastectomy then XRT.   3.  Atrial fibrillation: -Recently referred to atrial fibrillation clinic secondary to palpitations mainly at night time. -Her bisoprolol was increased from 5 mg to 7.5 mg daily.   PLAN:   1. CLL: - Denies any fevers, night sweats or weight loss in the last 6 months. - She was started on Veozah for hot flashes which is helping. - No palpable adenopathy in the neck, supraclavicular or axillary region. - Labs from 05/21/2022 shows normal LFTs and creatinine.  LDH was normal.  White count is 33.2, with 87% lymphocytes.  Hemoglobin is 11.8 and platelet count is normal. - No indication for treatment at this time.  RTC 6 months for follow-up with repeat labs.     2.  Stage III right breast cancer: - Left breast mammogram on 05/11/2021 was BI-RADS Category 1. - She will have mammogram repeated in December.     3.   Vitamin D deficiency: - Vitamin D level is 37.  Continue vitamin D 1000 units daily.    Orders placed this encounter:  No orders of the defined types were placed in this encounter.    Derek Jack, MD [place] [phone]

## 2022-05-09 DIAGNOSIS — M25511 Pain in right shoulder: Secondary | ICD-10-CM | POA: Diagnosis not present

## 2022-05-09 DIAGNOSIS — M1712 Unilateral primary osteoarthritis, left knee: Secondary | ICD-10-CM | POA: Diagnosis not present

## 2022-05-09 DIAGNOSIS — I480 Paroxysmal atrial fibrillation: Secondary | ICD-10-CM | POA: Diagnosis not present

## 2022-05-09 DIAGNOSIS — Z7901 Long term (current) use of anticoagulants: Secondary | ICD-10-CM | POA: Diagnosis not present

## 2022-05-20 ENCOUNTER — Ambulatory Visit (HOSPITAL_COMMUNITY): Payer: Medicare HMO

## 2022-05-23 ENCOUNTER — Emergency Department (HOSPITAL_COMMUNITY): Payer: Medicare HMO

## 2022-05-23 ENCOUNTER — Inpatient Hospital Stay (HOSPITAL_COMMUNITY)
Admission: EM | Admit: 2022-05-23 | Discharge: 2022-05-25 | DRG: 194 | Disposition: A | Discharge status: Home / Self Care | Payer: Medicare HMO | Attending: Internal Medicine | Admitting: Internal Medicine

## 2022-05-23 ENCOUNTER — Encounter (HOSPITAL_COMMUNITY): Payer: Self-pay | Admitting: Emergency Medicine

## 2022-05-23 ENCOUNTER — Other Ambulatory Visit: Payer: Self-pay

## 2022-05-23 DIAGNOSIS — Z90710 Acquired absence of both cervix and uterus: Secondary | ICD-10-CM | POA: Diagnosis not present

## 2022-05-23 DIAGNOSIS — I447 Left bundle-branch block, unspecified: Secondary | ICD-10-CM | POA: Diagnosis present

## 2022-05-23 DIAGNOSIS — Z23 Encounter for immunization: Secondary | ICD-10-CM | POA: Diagnosis not present

## 2022-05-23 DIAGNOSIS — Z853 Personal history of malignant neoplasm of breast: Secondary | ICD-10-CM

## 2022-05-23 DIAGNOSIS — Z888 Allergy status to other drugs, medicaments and biological substances status: Secondary | ICD-10-CM

## 2022-05-23 DIAGNOSIS — I5042 Chronic combined systolic (congestive) and diastolic (congestive) heart failure: Secondary | ICD-10-CM | POA: Diagnosis not present

## 2022-05-23 DIAGNOSIS — D72829 Elevated white blood cell count, unspecified: Secondary | ICD-10-CM | POA: Diagnosis not present

## 2022-05-23 DIAGNOSIS — Z96641 Presence of right artificial hip joint: Secondary | ICD-10-CM | POA: Diagnosis not present

## 2022-05-23 DIAGNOSIS — Z1152 Encounter for screening for COVID-19: Secondary | ICD-10-CM | POA: Diagnosis not present

## 2022-05-23 DIAGNOSIS — R404 Transient alteration of awareness: Secondary | ICD-10-CM | POA: Diagnosis not present

## 2022-05-23 DIAGNOSIS — Z9011 Acquired absence of right breast and nipple: Secondary | ICD-10-CM | POA: Diagnosis not present

## 2022-05-23 DIAGNOSIS — R059 Cough, unspecified: Secondary | ICD-10-CM | POA: Diagnosis not present

## 2022-05-23 DIAGNOSIS — J440 Chronic obstructive pulmonary disease with acute lower respiratory infection: Secondary | ICD-10-CM | POA: Diagnosis not present

## 2022-05-23 DIAGNOSIS — E782 Mixed hyperlipidemia: Secondary | ICD-10-CM | POA: Diagnosis present

## 2022-05-23 DIAGNOSIS — I480 Paroxysmal atrial fibrillation: Secondary | ICD-10-CM | POA: Diagnosis not present

## 2022-05-23 DIAGNOSIS — R0602 Shortness of breath: Secondary | ICD-10-CM | POA: Diagnosis not present

## 2022-05-23 DIAGNOSIS — K219 Gastro-esophageal reflux disease without esophagitis: Secondary | ICD-10-CM | POA: Diagnosis not present

## 2022-05-23 DIAGNOSIS — Z885 Allergy status to narcotic agent status: Secondary | ICD-10-CM | POA: Diagnosis not present

## 2022-05-23 DIAGNOSIS — J189 Pneumonia, unspecified organism: Secondary | ICD-10-CM | POA: Diagnosis present

## 2022-05-23 DIAGNOSIS — R062 Wheezing: Secondary | ICD-10-CM | POA: Diagnosis not present

## 2022-05-23 DIAGNOSIS — R079 Chest pain, unspecified: Secondary | ICD-10-CM | POA: Diagnosis not present

## 2022-05-23 DIAGNOSIS — I2510 Atherosclerotic heart disease of native coronary artery without angina pectoris: Secondary | ICD-10-CM | POA: Diagnosis not present

## 2022-05-23 DIAGNOSIS — Z809 Family history of malignant neoplasm, unspecified: Secondary | ICD-10-CM

## 2022-05-23 DIAGNOSIS — C9110 Chronic lymphocytic leukemia of B-cell type not having achieved remission: Secondary | ICD-10-CM | POA: Diagnosis present

## 2022-05-23 DIAGNOSIS — Z87891 Personal history of nicotine dependence: Secondary | ICD-10-CM | POA: Diagnosis not present

## 2022-05-23 DIAGNOSIS — Z882 Allergy status to sulfonamides status: Secondary | ICD-10-CM

## 2022-05-23 DIAGNOSIS — Z7901 Long term (current) use of anticoagulants: Secondary | ICD-10-CM | POA: Diagnosis not present

## 2022-05-23 DIAGNOSIS — J441 Chronic obstructive pulmonary disease with (acute) exacerbation: Secondary | ICD-10-CM | POA: Diagnosis not present

## 2022-05-23 DIAGNOSIS — Z79899 Other long term (current) drug therapy: Secondary | ICD-10-CM

## 2022-05-23 DIAGNOSIS — J449 Chronic obstructive pulmonary disease, unspecified: Secondary | ICD-10-CM | POA: Diagnosis present

## 2022-05-23 DIAGNOSIS — I959 Hypotension, unspecified: Secondary | ICD-10-CM | POA: Diagnosis not present

## 2022-05-23 DIAGNOSIS — Z743 Need for continuous supervision: Secondary | ICD-10-CM | POA: Diagnosis not present

## 2022-05-23 LAB — CBC WITH DIFFERENTIAL/PLATELET
Abs Immature Granulocytes: 0 10*3/uL (ref 0.00–0.07)
Basophils Absolute: 0.3 10*3/uL — ABNORMAL HIGH (ref 0.0–0.1)
Basophils Relative: 1 %
Eosinophils Absolute: 0.5 10*3/uL (ref 0.0–0.5)
Eosinophils Relative: 2 %
HCT: 38.3 % (ref 36.0–46.0)
Hemoglobin: 12.1 g/dL (ref 12.0–15.0)
Lymphocytes Relative: 70 %
Lymphs Abs: 19 10*3/uL — ABNORMAL HIGH (ref 0.7–4.0)
MCH: 30.8 pg (ref 26.0–34.0)
MCHC: 31.6 g/dL (ref 30.0–36.0)
MCV: 97.5 fL (ref 80.0–100.0)
Monocytes Absolute: 0.5 10*3/uL (ref 0.1–1.0)
Monocytes Relative: 2 %
Neutro Abs: 6.8 10*3/uL (ref 1.7–7.7)
Neutrophils Relative %: 25 %
Platelets: 208 10*3/uL (ref 150–400)
RBC: 3.93 MIL/uL (ref 3.87–5.11)
RDW: 13.7 % (ref 11.5–15.5)
WBC Morphology: >10
WBC: 27.1 10*3/uL — ABNORMAL HIGH (ref 4.0–10.5)
nRBC: 0 % (ref 0.0–0.2)

## 2022-05-23 LAB — LACTIC ACID, PLASMA: Lactic Acid, Venous: 1.3 mmol/L (ref 0.5–1.9)

## 2022-05-23 LAB — COMPREHENSIVE METABOLIC PANEL
ALT: 11 U/L (ref 0–44)
AST: 19 U/L (ref 15–41)
Albumin: 4.1 g/dL (ref 3.5–5.0)
Alkaline Phosphatase: 50 U/L (ref 38–126)
Anion gap: 8 (ref 5–15)
BUN: 25 mg/dL — ABNORMAL HIGH (ref 8–23)
CO2: 24 mmol/L (ref 22–32)
Calcium: 8.9 mg/dL (ref 8.9–10.3)
Chloride: 108 mmol/L (ref 98–111)
Creatinine, Ser: 1.07 mg/dL — ABNORMAL HIGH (ref 0.44–1.00)
GFR, Estimated: 51 mL/min — ABNORMAL LOW (ref >60)
Glucose, Bld: 119 mg/dL — ABNORMAL HIGH (ref 70–99)
Potassium: 4.5 mmol/L (ref 3.5–5.1)
Sodium: 140 mmol/L (ref 135–145)
Total Bilirubin: 0.7 mg/dL (ref 0.3–1.2)
Total Protein: 6.9 g/dL (ref 6.5–8.1)

## 2022-05-23 LAB — RESP PANEL BY RT-PCR (RSV, FLU A&B, COVID)  RVPGX2
Influenza A by PCR: NEGATIVE
Influenza B by PCR: NEGATIVE
Resp Syncytial Virus by PCR: NEGATIVE
SARS Coronavirus 2 by RT PCR: NEGATIVE

## 2022-05-23 MED ORDER — PANTOPRAZOLE SODIUM 40 MG PO TBEC
40.0000 mg | DELAYED_RELEASE_TABLET | Freq: Every day | ORAL | Status: DC
  Administered 2022-05-24 – 2022-05-25 (×2): 40 mg via ORAL
  Filled 2022-05-23 (×2): qty 1

## 2022-05-23 MED ORDER — ACETAMINOPHEN 325 MG PO TABS
650.0000 mg | ORAL_TABLET | Freq: Four times a day (QID) | ORAL | Status: DC | PRN
  Administered 2022-05-23 – 2022-05-25 (×5): 650 mg via ORAL
  Filled 2022-05-23 (×5): qty 2

## 2022-05-23 MED ORDER — INFLUENZA VAC A&B SA ADJ QUAD 0.5 ML IM PRSY
0.5000 mL | PREFILLED_SYRINGE | INTRAMUSCULAR | Status: AC
  Administered 2022-05-24: 0.5 mL via INTRAMUSCULAR
  Filled 2022-05-23: qty 0.5

## 2022-05-23 MED ORDER — SODIUM CHLORIDE 0.9 % IV BOLUS
1000.0000 mL | Freq: Once | INTRAVENOUS | Status: AC
  Administered 2022-05-23: 1000 mL via INTRAVENOUS

## 2022-05-23 MED ORDER — METHYLPREDNISOLONE SODIUM SUCC 40 MG IJ SOLR
40.0000 mg | Freq: Two times a day (BID) | INTRAMUSCULAR | Status: DC
  Administered 2022-05-23 – 2022-05-24 (×3): 40 mg via INTRAVENOUS
  Filled 2022-05-23 (×3): qty 1

## 2022-05-23 MED ORDER — SODIUM CHLORIDE 0.9 % IV SOLN
500.0000 mg | Freq: Once | INTRAVENOUS | Status: AC
  Administered 2022-05-23: 500 mg via INTRAVENOUS
  Filled 2022-05-23: qty 5

## 2022-05-23 MED ORDER — APIXABAN 5 MG PO TABS
5.0000 mg | ORAL_TABLET | Freq: Two times a day (BID) | ORAL | Status: DC
  Administered 2022-05-23 – 2022-05-25 (×4): 5 mg via ORAL
  Filled 2022-05-23 (×4): qty 1

## 2022-05-23 MED ORDER — ONDANSETRON HCL 4 MG PO TABS: 4.0000 mg | ORAL_TABLET | Freq: Four times a day (QID) | ORAL | Status: DC | PRN

## 2022-05-23 MED ORDER — ACETAMINOPHEN 650 MG RE SUPP: 650.0000 mg | Freq: Four times a day (QID) | RECTAL | Status: DC | PRN

## 2022-05-23 MED ORDER — SODIUM CHLORIDE 0.9 % IV SOLN
2.0000 g | Freq: Once | INTRAVENOUS | Status: AC
  Administered 2022-05-23: 2 g via INTRAVENOUS
  Filled 2022-05-23: qty 20

## 2022-05-23 MED ORDER — IPRATROPIUM-ALBUTEROL 0.5-2.5 (3) MG/3ML IN SOLN
3.0000 mL | Freq: Four times a day (QID) | RESPIRATORY_TRACT | Status: DC
  Administered 2022-05-24 (×3): 3 mL via RESPIRATORY_TRACT
  Filled 2022-05-23 (×4): qty 3

## 2022-05-23 MED ORDER — DM-GUAIFENESIN ER 30-600 MG PO TB12
1.0000 | ORAL_TABLET | Freq: Two times a day (BID) | ORAL | Status: DC
  Administered 2022-05-23 – 2022-05-25 (×4): 1 via ORAL
  Filled 2022-05-23 (×4): qty 1

## 2022-05-23 MED ORDER — ATORVASTATIN CALCIUM 10 MG PO TABS
20.0000 mg | ORAL_TABLET | Freq: Every day | ORAL | Status: DC
  Administered 2022-05-24 – 2022-05-25 (×2): 20 mg via ORAL
  Filled 2022-05-23 (×2): qty 2

## 2022-05-23 MED ORDER — ONDANSETRON HCL 4 MG/2ML IJ SOLN: 4.0000 mg | Freq: Four times a day (QID) | INTRAMUSCULAR | Status: DC | PRN

## 2022-05-23 MED ORDER — PANTOPRAZOLE SODIUM 40 MG PO TBEC: 40.0000 mg | DELAYED_RELEASE_TABLET | Freq: Every day | ORAL | Status: DC

## 2022-05-23 NOTE — ED Notes (Signed)
Unable to get blood cultures when starting IV. Lab in room.

## 2022-05-23 NOTE — ED Triage Notes (Signed)
Pt c/o cough and sob x 3 days. C/o sore throat. A/o. No resp distress or sob noted.

## 2022-05-23 NOTE — ED Provider Triage Note (Signed)
Emergency Medicine Provider Triage Evaluation Note  [name] , a 87 y.o. female  was evaluated in triage.  Pt complains of a cough and congestion.  Pt has a history of CLL   Review of Systems  Positive: cough Negative: fever  Physical Exam  BP (!) 114/101 (BP Location: Left Arm)   Pulse 68   Temp 98.1 F (36.7 C) (Oral)   Resp 18   SpO2 99%  Gen:   Awake, no distress   Resp:  Normal effort  MSK:   Moves extremities without difficulty  Other:    Medical Decision Making  Medically screening exam initiated at 5:21 PM.  Appropriate orders placed.  [name] was informed that the remainder of the evaluation will be completed by another provider, this initial triage assessment does not replace that evaluation, and the importance of remaining in the ED until their evaluation is complete.     Fransico Meadow, [place] 05/23/22 1722

## 2022-05-23 NOTE — ED Notes (Signed)
Per pt. Pt's right arm is restricted only to BP. Will allow blood draws on right arm

## 2022-05-23 NOTE — ED Notes (Signed)
Updated friend, Stanton Kidney, pt gave permission to call her. Said she will be primary contact and will pick up pt.

## 2022-05-23 NOTE — ED Notes (Signed)
Lab finished in room; will start abx

## 2022-05-23 NOTE — Sepsis Progress Note (Signed)
Elink monitoring for the code sepsis protocol.  

## 2022-05-23 NOTE — H&P (Signed)
History and Physical    Patient: [name] ZMO:[id number] DOB: 1936/01/18 DOA: 05/23/2022 DOS: the patient was seen and examined on 05/23/2022 PCP: Lemmie Evens, MD  Patient coming from: Home  Chief Complaint:  Chief Complaint  Patient presents with   Cough   Shortness of Breath   HPI: [name] is a 87 y.o. female with medical history significant of CLL, paroxysmal atrial fibrillation, HFrEF (LVEF normal on 04/08/2021 was 35 to 40%), COPD.  Right-sided breast cancer, LBBB, CAD who presents emergency department due to the emergency department due to 3-day onset of cough with production of thick white mucus and shortness of breath which worsened this morning due to wheezing and worsening shortness of breath on ambulation.  Patient states that she has been using at home nebulizer and inhaler more frequently since last 3 days the symptoms started.  She denies fever, chills, sick contacts.  Patient lives at home alone and uses cane at baseline.  ED Course:  In the emergency department, BP was 114/101, and other vital signs were within normal range.  Workup in the ED showed normal CBC except for leukocytosis with lymphocyte predominance, BMP was normal except for blood glucose of 119, BUN/creatinine 25/1.07 (baseline creatinine at 0.8-1.0).  Blood culture pending, influenza A, B, SARS coronavirus 2, RSV was negative. Chest x-ray showed increased density in the medial left lower lung fields may be related to tortuous descending thoracic aorta or atelectasis/pneumonia in left lower lobe. Blunting of left lateral CP angle suggests possible small effusion. She was treated with ceftriaxone and azithromycin, IV hydration was provided.  Oncology (Dr. Delton Coombes) was consulted and recommended hospital admission per EDP. Hospitalist was asked to admit patient for further evaluation and management.  Review of Systems: Review of systems as noted in the HPI. All other systems reviewed and are  negative.   Past Medical History:  Diagnosis Date   Bowel obstruction ([place])    Breast cancer ([place])    Breast cancer, stage 3 ([place]) 07/21/2011   Bronchitis    CAD (coronary artery disease)    a. cath in 06/2020 showing mild to moderate nonobstructive CAD   CHF (congestive heart failure) ([place])    a. EF previously 55-60% in 06/2018 b. EF reduced to 40-45% by repeat echo in 03/2019 and at 35-40% by echo in 05/2020   CLL (chronic lymphocytic leukemia) ([place]) 07/21/2011   Migraines    PAF (paroxysmal atrial fibrillation) (HCC)    Pneumonia    Past Surgical History:  Procedure Laterality Date   ABDOMINAL HYSTERECTOMY     APPENDECTOMY     CHOLECYSTECTOMY     LAPAROTOMY N/A 06/08/2015   Procedure: EXPLORATORY LAPAROTOMY;  Surgeon: Aviva Signs, MD;  Location: [facility];  Service: General;  Laterality: N/A;   LYSIS OF ADHESION N/A 06/08/2015   Procedure: LYSIS OF ADHESION;  Surgeon: Aviva Signs, MD;  Location: [facility];  Service: General;  Laterality: N/A;   MASTECTOMY     right   RIGHT/LEFT HEART CATH AND CORONARY ANGIOGRAPHY N/A 07/09/2020   Procedure: RIGHT/LEFT HEART CATH AND CORONARY ANGIOGRAPHY;  Surgeon: Nelva Bush, MD;  Location: [facility];  Service: Cardiovascular;  Laterality: N/A;   TOTAL HIP ARTHROPLASTY     right    Social History:  reports that she quit smoking about 43 years ago. Her smoking use included cigarettes. She has never used smokeless tobacco. She reports that she does not drink alcohol and does not use drugs.   Allergies  Allergen Reactions   Codeine Nausea And Vomiting    headache   Losartan Nausea Only    Nauseated and headaches   Meclizine Other (See Comments)    Made dizziness worse.   Sulfa Antibiotics Nausea And Vomiting    Stomach upset   Zoloft [Sertraline Hcl] Anxiety    Family History  Problem Relation Age of Onset   Cancer Brother    Cancer Other     ***  Prior to Admission medications   Medication Sig Start Date End Date  Taking? Authorizing Provider  acetaminophen (TYLENOL) 500 MG tablet Take 1,000 mg by mouth every 6 (six) hours as needed for moderate pain or headache.    [provider]  albuterol (PROVENTIL HFA;VENTOLIN HFA) 108 (90 BASE) MCG/ACT inhaler Inhale 2 puffs into the lungs every 6 (six) hours as needed for shortness of breath.    [provider]  albuterol (PROVENTIL) (2.5 MG/3ML) 0.083% nebulizer solution Take 3 mLs (2.5 mg total) by nebulization every 6 (six) hours as needed for wheezing or shortness of breath (when not relieved by inhaler.). 06/27/18   Barton Dubois, MD  apixaban (ELIQUIS) 5 MG TABS tablet TAKE 1 TABLET BY MOUTH 2 TIMES DAILY. 12/23/21   Fay Records, MD  atorvastatin (LIPITOR) 20 MG tablet TAKE ONE TABLET BY MOUTH ONCE DAILY. 01/26/22   Barrett, Evelene Croon, PA-C  bisoprolol (ZEBETA) 5 MG tablet TAKE (1/2) TABLET BY MOUTH ONCE DAILY. 10/04/21   Fay Records, MD  Capsaicin-Menthol (SALONPAS GEL EX) Apply topically.    [provider]  cholecalciferol (VITAMIN D3) 25 MCG (1000 UNIT) tablet Take 1 capsule by mouth daily. 04/27/21   Derek Jack, MD  diphenhydrAMINE (BENADRYL) 25 MG tablet Take 25 mg by mouth daily as needed for allergies.    [provider]  losartan (COZAAR) 25 MG tablet TAKE (1/2) TABLET BY MOUTH DAILY 12/23/21   Fay Records, MD  Misc. 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 Dx: Z61.096 11/01/21   Derek Jack, MD  omeprazole (PRILOSEC) 20 MG capsule Take 20 mg by mouth daily.    [provider]  ondansetron (ZOFRAN) 4 MG tablet Take 1 tablet (4 mg total) by mouth every 8 (eight) hours as needed for nausea or vomiting. 05/13/21   Ahmed Prima, Fransisco Hertz, PA-C  spironolactone (ALDACTONE) 25 MG tablet TAKE (1) TABLET BY MOUTH DAILY. 01/26/22   Fay Records, MD  VEOZAH 45 MG TABS Take 1 tablet by mouth daily. 04/29/22   [provider]    Physical Exam: BP 129/84 (BP  Location: Left Arm)   Pulse (!) 54   Temp (!) 97.5 F (36.4 C) (Oral)   Resp 19   Ht '5\' 2"'$  (1.575 m)   Wt 74.2 kg   SpO2 94%   BMI 29.92 kg/m   General: 87 y.o. year-old female well developed well nourished in no acute distress.  Alert and oriented x3. HEENT: NCAT, EOMI Neck: Supple, trachea medial Cardiovascular: Regular rate and rhythm with no rubs or gallops.  No thyromegaly or JVD noted.  No lower extremity edema. 2/4 pulses in all 4 extremities. Respiratory: Patient was coughing during the exam.  Mild expiratory wheezes on auscultation.  No rhonchi.   Abdomen: Soft, nontender nondistended with normal bowel sounds x4 quadrants. Muskuloskeletal: No cyanosis, clubbing or edema noted bilaterally Neuro: CN II-XII intact, strength 5/5 x 4, sensation, reflexes intact Skin: No ulcerative lesions noted or rashes Psychiatry: Judgement and insight  appear normal. Mood is appropriate for condition and setting          Labs on Admission:  Basic Metabolic Panel: Recent Labs  Lab 05/23/22 1731  NA 140  K 4.5  CL 108  CO2 24  GLUCOSE 119*  BUN 25*  CREATININE 1.07*  CALCIUM 8.9   Liver Function Tests: Recent Labs  Lab 05/23/22 1731  AST 19  ALT 11  ALKPHOS 50  BILITOT 0.7  PROT 6.9  ALBUMIN 4.1   No results for input(s): "LIPASE", "AMYLASE" in the last 168 hours. No results for input(s): "AMMONIA" in the last 168 hours. CBC: Recent Labs  Lab 05/23/22 1731  WBC 27.1*  NEUTROABS 6.8  HGB 12.1  HCT 38.3  MCV 97.5  PLT 208   Cardiac Enzymes: No results for input(s): "CKTOTAL", "CKMB", "CKMBINDEX", "TROPONINI" in the last 168 hours.  BNP (last 3 results) No results for input(s): "BNP" in the last 8760 hours.  ProBNP (last 3 results) No results for input(s): "PROBNP" in the last 8760 hours.  CBG: No results for input(s): "GLUCAP" in the last 168 hours.  Radiological Exams on Admission: DG Chest 2 View  Result Date: 05/23/2022 CLINICAL DATA:  Cough, shortness  of breath, wheezing EXAM: CHEST - 2 VIEW COMPARISON:  04/07/2021 FINDINGS: Transverse diameter of heart is increased. Increased density is seen in left lower lung field. This may be due to tortuous descending thoracic aorta. Possibility of infiltrate in left lower lung fields is not excluded. There is blunting of left lateral CP angle. There are no signs of alveolar pulmonary edema. There is no pneumothorax. IMPRESSION: Increased density in the medial left lower lung fields may be related to tortuous descending thoracic aorta or atelectasis/pneumonia in left lower lobe. Blunting of left lateral CP angle suggests possible small effusion. Electronically Signed   By: Elmer Picker M.D.   On: 05/23/2022 16:32    EKG: I independently viewed the EKG done and my findings are as followed: EKG was not done in the ED  Assessment/Plan Present on Admission:  CAP (community acquired pneumonia)  Paroxysmal atrial fibrillation (HCC)  Chronic combined systolic and diastolic CHF (congestive heart failure) (HCC)  CLL (chronic lymphocytic leukemia) ([place])  COPD with acute exacerbation ([place])  Mixed hyperlipidemia  Principal Problem:   CAP (community acquired pneumonia) Active Problems:   Mixed hyperlipidemia   CLL (chronic lymphocytic leukemia) ([place])   COPD with acute exacerbation (HCC)   Paroxysmal atrial fibrillation (HCC)   Chronic combined systolic and diastolic CHF (congestive heart failure) ([place])   Leukocytosis   CAD (coronary artery disease)   GERD without esophagitis   Community-acquired pneumonia POA with superimposed mild exacerbation of COPD Chest x-ray was suggestive of pneumonia Patient was started on ceftriaxone and azithromycin, we shall continue same at this time with plan to de-escalate/discontinue based on blood culture, sputum culture, urine Legionella, strep pneumo and procalcitonin Continue Tylenol as needed Continue Mucinex, DuoNebs, Solu-Medrol, incentive spirometry, flutter  valve  Continue Protonix to prevent steroid-induced ulcer  Leukocytosis possibly secondary to patient's history of CLL Dr. Delton Coombes was consulted and will follow-up on patient in the morning by EDP  PAF (paroxysmal atrial fibrillation) Continue Eliquis for secondary prevention Hold bisoprolol temporarily due to soft BP  Chronic systolic and diastolic CHF Echo done on 01/01/2021 showed LVEF of 35-40%, +WMA, G1DD, mild MR, mild AS Losartan  and spironolactone were temporarily held due to soft BP  CAD Stable  Mixed hyperlipidemia Continue Lipitor  GERD Continue Protonix  DVT prophylaxis: Eliquis  Code Status: Full code  Family Communication: None at bedside  Consults: Oncology per EDP  Severity of Illness: The appropriate patient status for this patient is INPATIENT. Inpatient status is judged to be reasonable and necessary in order to provide the required intensity of service to ensure the patient's safety. The patient's presenting symptoms, physical exam findings, and initial radiographic and laboratory data in the context of their chronic comorbidities is felt to place them at high risk for further clinical deterioration. Furthermore, it is not anticipated that the patient will be medically stable for discharge from the hospital within 2 midnights of admission.   * I certify that at the point of admission it is my clinical judgment that the patient will require inpatient hospital care spanning beyond 2 midnights from the point of admission due to high intensity of service, high risk for further deterioration and high frequency of surveillance required.*  Author: Bernadette Hoit, DO 05/23/2022 10:55 PM  For on call review [email or web address].

## 2022-05-24 DIAGNOSIS — E782 Mixed hyperlipidemia: Secondary | ICD-10-CM | POA: Diagnosis not present

## 2022-05-24 DIAGNOSIS — J189 Pneumonia, unspecified organism: Secondary | ICD-10-CM | POA: Diagnosis not present

## 2022-05-24 DIAGNOSIS — D72829 Elevated white blood cell count, unspecified: Secondary | ICD-10-CM | POA: Diagnosis not present

## 2022-05-24 DIAGNOSIS — I5042 Chronic combined systolic (congestive) and diastolic (congestive) heart failure: Secondary | ICD-10-CM | POA: Diagnosis not present

## 2022-05-24 LAB — COMPREHENSIVE METABOLIC PANEL
ALT: 11 U/L (ref 0–44)
AST: 21 U/L (ref 15–41)
Albumin: 3.6 g/dL (ref 3.5–5.0)
Alkaline Phosphatase: 47 U/L (ref 38–126)
Anion gap: 9 (ref 5–15)
BUN: 24 mg/dL — ABNORMAL HIGH (ref 8–23)
CO2: 20 mmol/L — ABNORMAL LOW (ref 22–32)
Calcium: 8.4 mg/dL — ABNORMAL LOW (ref 8.9–10.3)
Chloride: 111 mmol/L (ref 98–111)
Creatinine, Ser: 1.05 mg/dL — ABNORMAL HIGH (ref 0.44–1.00)
GFR, Estimated: 52 mL/min — ABNORMAL LOW (ref >60)
Glucose, Bld: 160 mg/dL — ABNORMAL HIGH (ref 70–99)
Potassium: 4.5 mmol/L (ref 3.5–5.1)
Sodium: 140 mmol/L (ref 135–145)
Total Bilirubin: 0.5 mg/dL (ref 0.3–1.2)
Total Protein: 6.2 g/dL — ABNORMAL LOW (ref 6.5–8.1)

## 2022-05-24 LAB — MAGNESIUM: Magnesium: 2 mg/dL (ref 1.7–2.4)

## 2022-05-24 LAB — CBC
HCT: 35 % — ABNORMAL LOW (ref 36.0–46.0)
Hemoglobin: 11 g/dL — ABNORMAL LOW (ref 12.0–15.0)
MCH: 30.8 pg (ref 26.0–34.0)
MCHC: 31.4 g/dL (ref 30.0–36.0)
MCV: 98 fL (ref 80.0–100.0)
Platelets: 179 10*3/uL (ref 150–400)
RBC: 3.57 MIL/uL — ABNORMAL LOW (ref 3.87–5.11)
RDW: 13.6 % (ref 11.5–15.5)
WBC: 26.5 10*3/uL — ABNORMAL HIGH (ref 4.0–10.5)
nRBC: 0 % (ref 0.0–0.2)

## 2022-05-24 LAB — EXPECTORATED SPUTUM ASSESSMENT W GRAM STAIN, RFLX TO RESP C

## 2022-05-24 LAB — PHOSPHORUS: Phosphorus: 3.4 mg/dL (ref 2.5–4.6)

## 2022-05-24 LAB — PROCALCITONIN: Procalcitonin: <0.1 ng/mL

## 2022-05-24 LAB — STREP PNEUMONIAE URINARY ANTIGEN: Strep Pneumo Urinary Antigen: NEGATIVE

## 2022-05-24 MED ORDER — SODIUM CHLORIDE 0.9 % IV SOLN
500.0000 mg | INTRAVENOUS | Status: DC
  Administered 2022-05-24: 500 mg via INTRAVENOUS
  Filled 2022-05-24: qty 5

## 2022-05-24 MED ORDER — SODIUM CHLORIDE 0.9 % IV SOLN
2.0000 g | INTRAVENOUS | Status: DC
  Administered 2022-05-24: 2 g via INTRAVENOUS
  Filled 2022-05-24: qty 20

## 2022-05-24 MED ORDER — IPRATROPIUM-ALBUTEROL 0.5-2.5 (3) MG/3ML IN SOLN
3.0000 mL | Freq: Three times a day (TID) | RESPIRATORY_TRACT | Status: DC
  Administered 2022-05-25 (×2): 3 mL via RESPIRATORY_TRACT
  Filled 2022-05-24 (×2): qty 3

## 2022-05-24 MED ORDER — BUDESONIDE 0.5 MG/2ML IN SUSP
0.5000 mg | Freq: Two times a day (BID) | RESPIRATORY_TRACT | Status: DC
  Administered 2022-05-24 – 2022-05-25 (×2): 0.5 mg via RESPIRATORY_TRACT
  Filled 2022-05-24 (×2): qty 2

## 2022-05-24 NOTE — Progress Notes (Signed)
Progress Note   Patient: [name] [id number]:[id number] DOB: Jun 28, 1935 DOA: 05/23/2022     1 DOS: the patient was seen and examined on 05/24/2022   Brief hospital course: As per H&P written by Dr. Josephine Cables on 05/23/2022 [name] is a 87 y.o. female with medical history significant of CLL, paroxysmal atrial fibrillation, HFrEF (LVEF normal on 04/08/2021 was 35 to 40%), COPD.  Right-sided breast cancer, LBBB, CAD who presents emergency department due to the emergency department due to 3-day onset of cough with production of thick white mucus and shortness of breath which worsened this morning due to wheezing and worsening shortness of breath on ambulation.  Patient states that she has been using at home nebulizer and inhaler more frequently since last 3 days the symptoms started.  She denies fever, chills, sick contacts.  Patient lives at home alone and uses cane at baseline.   ED Course:  In the emergency department, BP was 114/101, and other vital signs were within normal range.  Workup in the ED showed normal CBC except for leukocytosis with lymphocyte predominance, BMP was normal except for blood glucose of 119, BUN/creatinine 25/1.07 (baseline creatinine at 0.8-1.0).  Blood culture pending, influenza A, B, SARS coronavirus 2, RSV was negative. Chest x-ray showed increased density in the medial left lower lung fields may be related to tortuous descending thoracic aorta or atelectasis/pneumonia in left lower lobe. Blunting of left lateral CP angle suggests possible small effusion. She was treated with ceftriaxone and azithromycin, IV hydration was provided.  Oncology (Dr. Delton Coombes) was consulted and recommended hospital admission per EDP. Hospitalist was asked to admit patient for further evaluation and management.  Assessment and Plan: 1-community-acquired pneumonia -Chest x-ray demonstrating right middle and lower lobe opacities -Continue the use of mucolytic's, bronchodilators and IV  antibiotics. -Flutter valve requested to assist with secretion and improve pulmonary function.  2-COPD exacerbation -Most likely triggered by acute pneumonia infection -Continue antibiotic -Continue bronchodilators, DuoNebs, Pulmicort, steroids and supportive care -No requiring oxygen supplementation currently. -Follow clinical response.  3-leukocytosis -Most likely in the setting of acute infection along with underlying history of CLL -Continue outpatient follow-up with oncology service.  4-paroxysmal atrial fibrillation -Denying palpitations currently -Continue the use of Eliquis for secondary prevention -Due to soft blood pressure continue holding bisoprolol currently. -Follow blood pressure and vital signs.  5-history of chronic systolic and diastolic CHF -Last ejection fraction 35 to 40%; -Follow daily weights and strict I's and O's -Plan to resume home beta-blocker and diuretic regimen -No crackles or complaints of orthopnea currently.  6-coronary artery disease -Patient denies chest pain -Continue patient follow-up with cardiology service.  7-hyperlipidemia -Continue statin  8-gastroesophageal reflux disease -Continue PPI.   Subjective:  Complaining of intermittent coughing spells unsure when the sensation; no requiring oxygen supplementation.  Currently afebrile.  Physical Exam: Vitals:   05/24/22 0723 05/24/22 0758 05/24/22 1302 05/24/22 1354  BP:  (!) 119/56 105/71   Pulse:  63 61   Resp:      Temp:   98 F (36.7 C)   TempSrc:   Oral   SpO2: 100%  96% 97%  Weight:      Height:       General exam: Alert, awake, oriented x 3; in no acute distress.  Reports feeling better. Respiratory system: Positive expiratory wheezing and positive rhonchi on auscultation; no using accessory muscles. Cardiovascular system:Rate controlled; no rubs, no gallops, no JVD on exam. Gastrointestinal system: Abdomen is nondistended, soft and nontender. No organomegaly  or masses  felt. Normal bowel sounds heard. Central nervous system: Alert and oriented. No focal neurological deficits. Extremities: No cyanosis or clubbing. Skin: No petechiae. Psychiatry: Judgement and insight appear normal. Mood & affect appropriate.    Data Reviewed: Strep pneumo urinary antigen negative -Comprehensive metabolic panel: Sodium 599, potassium 4.5, chloride 111, BUN 24, creatinine 1.05 and normal LFTs -CBC: WBCs 26.5, hemoglobin 11.0 and platelet count 179 K -Magnesium: 2.0 -Phosphorus: 3.4 -Procalcitonin:< 0.10  Family Communication: No family at bedside.  Disposition: Status is: Inpatient Remains inpatient appropriate because: Continue treatment with steroids and IV antibiotics.   Planned Discharge Destination: Home  Time spent: 40 minutes  Author: Barton Dubois, MD 05/24/2022 5:53 PM  For on call review [email or web address].

## 2022-05-24 NOTE — Progress Notes (Signed)
  Transition of Care [facility]) Screening Note   Patient Details  Name: [name] Date of Birth: 12/09/1935   Transition of Care [facility]) CM/SW Contact:    Ihor Gully, LCSW Phone Number: 05/24/2022, 2:18 PM    Transition of Care Department [facility]) has reviewed patient and no TOC needs have been identified at this time. We will continue to monitor patient advancement through interdisciplinary progression rounds. If new patient transition needs arise, please place a TOC consult.

## 2022-05-24 NOTE — Progress Notes (Signed)
Patient admitted to floor from ER. alert and oriented x4. Bed in lowest position, call bell within reach. Patient oriented to room. No complaints at this time.

## 2022-05-25 DIAGNOSIS — I2510 Atherosclerotic heart disease of native coronary artery without angina pectoris: Secondary | ICD-10-CM | POA: Diagnosis not present

## 2022-05-25 DIAGNOSIS — Z23 Encounter for immunization: Secondary | ICD-10-CM | POA: Diagnosis not present

## 2022-05-25 DIAGNOSIS — K219 Gastro-esophageal reflux disease without esophagitis: Secondary | ICD-10-CM | POA: Diagnosis not present

## 2022-05-25 DIAGNOSIS — J189 Pneumonia, unspecified organism: Secondary | ICD-10-CM | POA: Diagnosis not present

## 2022-05-25 DIAGNOSIS — I5042 Chronic combined systolic (congestive) and diastolic (congestive) heart failure: Secondary | ICD-10-CM | POA: Diagnosis not present

## 2022-05-25 LAB — LEGIONELLA PNEUMOPHILA SEROGP 1 UR AG: L. pneumophila Serogp 1 Ur Ag: NEGATIVE

## 2022-05-25 MED ORDER — BUDESONIDE-FORMOTEROL FUMARATE 160-4.5 MCG/ACT IN AERO: 2.0000 | INHALATION_SPRAY | Freq: Two times a day (BID) | RESPIRATORY_TRACT | 12 refills | Status: DC

## 2022-05-25 MED ORDER — AMOXICILLIN-POT CLAVULANATE 875-125 MG PO TABS: 1.0000 | ORAL_TABLET | Freq: Two times a day (BID) | ORAL | 0 refills | Status: AC

## 2022-05-25 MED ORDER — PREDNISONE 20 MG PO TABS
40.0000 mg | ORAL_TABLET | Freq: Every day | ORAL | Status: DC
  Administered 2022-05-25: 40 mg via ORAL
  Filled 2022-05-25: qty 2

## 2022-05-25 MED ORDER — AMOXICILLIN-POT CLAVULANATE 875-125 MG PO TABS
1.0000 | ORAL_TABLET | Freq: Two times a day (BID) | ORAL | Status: DC
  Administered 2022-05-25: 1 via ORAL
  Filled 2022-05-25: qty 1

## 2022-05-25 MED ORDER — DM-GUAIFENESIN ER 30-600 MG PO TB12: 1.0000 | ORAL_TABLET | Freq: Two times a day (BID) | ORAL | 0 refills | Status: DC

## 2022-05-25 MED ORDER — PREDNISONE 20 MG PO TABS: ORAL_TABLET | ORAL | 0 refills | Status: DC

## 2022-05-25 MED ORDER — AZITHROMYCIN 250 MG PO TABS
500.0000 mg | ORAL_TABLET | Freq: Once | ORAL | Status: AC
  Administered 2022-05-25: 500 mg via ORAL
  Filled 2022-05-25: qty 2

## 2022-05-25 NOTE — ED Provider Notes (Signed)
[facility] Provider Note   CSN: [id number] Arrival date & time: 05/23/22  1533     History  Chief Complaint  Patient presents with   Cough   Shortness of Breath    [name] is a 87 y.o. female.  Patient complains of a cough..  Patient also has diarrhea.  Patient has a history of GERD  The history is provided by the patient and medical records. No language interpreter was used.  Cough Cough characteristics:  Productive Sputum characteristics:  Nondescript Severity:  Moderate Onset quality:  Sudden Timing:  Constant Chronicity:  New Smoker: no   Context: not animal exposure   Relieved by:  Nothing Worsened by:  Nothing Associated symptoms: shortness of breath   Associated symptoms: no chest pain, no eye discharge, no headaches and no rash   Shortness of Breath Associated symptoms: cough   Associated symptoms: no abdominal pain, no chest pain, no headaches and no rash        Home Medications Prior to Admission medications   Medication Sig Start Date End Date Taking? Authorizing Provider  acetaminophen (TYLENOL) 500 MG tablet Take 1,000 mg by mouth every 6 (six) hours as needed for moderate pain or headache.   Yes [provider]  albuterol (PROVENTIL HFA;VENTOLIN HFA) 108 (90 BASE) MCG/ACT inhaler Inhale 2 puffs into the lungs every 6 (six) hours as needed for shortness of breath.   Yes [provider]  albuterol (PROVENTIL) (2.5 MG/3ML) 0.083% nebulizer solution Take 3 mLs (2.5 mg total) by nebulization every 6 (six) hours as needed for wheezing or shortness of breath (when not relieved by inhaler.). 06/27/18  Yes Barton Dubois, MD  apixaban (ELIQUIS) 5 MG TABS tablet TAKE 1 TABLET BY MOUTH 2 TIMES DAILY. 12/23/21  Yes Fay Records, MD  atorvastatin (LIPITOR) 20 MG tablet TAKE ONE TABLET BY MOUTH ONCE DAILY. 01/26/22  Yes Barrett, Evelene Croon, PA-C  bisoprolol (ZEBETA) 5 MG tablet TAKE (1/2) TABLET BY MOUTH ONCE DAILY. 10/04/21   Yes Fay Records, MD  cholecalciferol (VITAMIN D3) 25 MCG (1000 UNIT) tablet Take 1 capsule by mouth daily. 04/27/21  Yes Derek Jack, MD  diphenhydrAMINE (BENADRYL) 25 MG tablet Take 25 mg by mouth daily as needed for allergies.   Yes [provider]  losartan (COZAAR) 25 MG tablet TAKE (1/2) TABLET BY MOUTH DAILY 12/23/21  Yes Fay Records, MD  omeprazole (PRILOSEC) 20 MG capsule Take 20 mg by mouth daily.   Yes [provider]  ondansetron (ZOFRAN) 4 MG tablet Take 1 tablet (4 mg total) by mouth every 8 (eight) hours as needed for nausea or vomiting. 05/13/21  Yes Strader, [place], PA-C  spironolactone (ALDACTONE) 25 MG tablet TAKE (1) TABLET BY MOUTH DAILY. 01/26/22  Yes Fay Records, MD  VEOZAH 45 MG TABS Take 1 tablet by mouth daily. 04/29/22  Yes [provider]  [place]. 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  HENT:  Negative for congestion, ear discharge and sinus pressure.   Eyes:  Negative for discharge.  Respiratory:  Positive for cough and shortness of breath.   Cardiovascular:  Negative for chest pain.  Gastrointestinal:  Negative for  abdominal pain and diarrhea.  Genitourinary:  Negative for frequency and hematuria.  Musculoskeletal:  Negative for back pain.  Skin:  Negative for rash.  Neurological:  Negative for seizures and headaches.  Psychiatric/Behavioral:  Negative for hallucinations.     Physical Exam Updated Vital Signs BP (!) 119/47 (BP Location: Left Arm)   Pulse (!) 59   Temp 98.3 F (36.8 C) (Oral)   Resp 18   Ht '5\' 2"'$  (1.575 m)   Wt 73.6 kg   SpO2 98%   BMI 29.67 kg/m  Physical Exam Vitals and nursing note reviewed.  Constitutional:       Appearance: She is well-developed.  HENT:     Head: Normocephalic.     Nose: Nose normal.  Eyes:     General: No scleral icterus.    Conjunctiva/sclera: Conjunctivae normal.  Neck:     Thyroid: No thyromegaly.  Cardiovascular:     Rate and Rhythm: Normal rate and regular rhythm.     Heart sounds: No murmur heard.    No friction rub. No gallop.  Pulmonary:     Breath sounds: No stridor. No wheezing or rales.  Chest:     Chest wall: No tenderness.  Abdominal:     General: There is no distension.     Tenderness: There is no abdominal tenderness. There is no rebound.  Musculoskeletal:        General: Normal range of motion.     Cervical back: Neck supple.  Lymphadenopathy:     Cervical: No cervical adenopathy.  Skin:    Findings: No erythema or rash.  Neurological:     Mental Status: She is alert and oriented to person, place, and time.     Motor: No abnormal muscle tone.     Coordination: Coordination normal.  Psychiatric:        Behavior: Behavior normal.     ED Results / Procedures / Treatments   Labs (all labs ordered are listed, but only abnormal results are displayed) Labs Reviewed  CBC WITH DIFFERENTIAL/PLATELET - Abnormal; Notable for the following components:      Result Value   WBC 27.1 (*)    Lymphs Abs 19.0 (*)    Basophils Absolute 0.3 (*)    All other components within normal limits  COMPREHENSIVE METABOLIC PANEL - Abnormal; Notable for the following components:   Glucose, Bld 119 (*)    BUN 25 (*)    Creatinine, Ser 1.07 (*)    GFR, Estimated 51 (*)    All other components within normal limits  COMPREHENSIVE METABOLIC PANEL - Abnormal; Notable for the following components:   CO2 20 (*)    Glucose, Bld 160 (*)    BUN 24 (*)    Creatinine, Ser 1.05 (*)    Calcium 8.4 (*)    Total Protein 6.2 (*)    GFR, Estimated 52 (*)    All other components within normal limits  CBC - Abnormal; Notable for the following components:   WBC 26.5 (*)    RBC  3.57 (*)    Hemoglobin 11.0 (*)    HCT 35.0 (*)    All other components within normal limits  RESP PANEL BY RT-PCR (RSV, FLU A&B, COVID)  RVPGX2  CULTURE, BLOOD (SINGLE)  EXPECTORATED SPUTUM ASSESSMENT W GRAM STAIN, RFLX TO RESP C  CULTURE, RESPIRATORY W GRAM STAIN  LACTIC ACID, PLASMA  STREP PNEUMONIAE URINARY ANTIGEN  MAGNESIUM  PHOSPHORUS  PROCALCITONIN  LEGIONELLA PNEUMOPHILA SEROGP 1 UR AG  EKG None  Radiology DG Chest 2 View  Result Date: 05/23/2022 CLINICAL DATA:  Cough, shortness of breath, wheezing EXAM: CHEST - 2 VIEW COMPARISON:  04/07/2021 FINDINGS: Transverse diameter of heart is increased. Increased density is seen in left lower lung field. This may be due to tortuous descending thoracic aorta. Possibility of infiltrate in left lower lung fields is not excluded. There is blunting of left lateral CP angle. There are no signs of alveolar pulmonary edema. There is no pneumothorax. IMPRESSION: Increased density in the medial left lower lung fields may be related to tortuous descending thoracic aorta or atelectasis/pneumonia in left lower lobe. Blunting of left lateral CP angle suggests possible small effusion. Electronically Signed   By: Elmer Picker M.D.   On: 05/23/2022 16:32    Procedures Procedures    Medications Ordered in ED Medications  atorvastatin (LIPITOR) tablet 20 mg (20 mg Oral Given 05/25/22 0903)  apixaban (ELIQUIS) tablet 5 mg (5 mg Oral Given 05/25/22 0903)  acetaminophen (TYLENOL) tablet 650 mg (650 mg Oral Given 05/25/22 0549)    Or  acetaminophen (TYLENOL) suppository 650 mg ( Rectal See Alternative 05/25/22 0549)  ondansetron (ZOFRAN) tablet 4 mg (has no administration in time range)    Or  ondansetron (ZOFRAN) injection 4 mg (has no administration in time range)  dextromethorphan-guaiFENesin (MUCINEX DM) 30-600 MG per 12 hr tablet 1 tablet (1 tablet Oral Given 05/25/22 0903)  pantoprazole (PROTONIX) EC tablet 40 mg (40 mg Oral Given 05/25/22 0903)   budesonide (PULMICORT) nebulizer solution 0.5 mg (0.5 mg Nebulization Given 05/25/22 0758)  ipratropium-albuterol (DUONEB) 0.5-2.5 (3) MG/3ML nebulizer solution 3 mL (3 mLs Nebulization Given 05/25/22 0755)  amoxicillin-clavulanate (AUGMENTIN) 875-125 MG per tablet 1 tablet (1 tablet Oral Given 05/25/22 0904)  predniSONE (DELTASONE) tablet 40 mg (40 mg Oral Given 05/25/22 0752)  sodium chloride 0.9 % bolus 1,000 mL (1,000 mLs Intravenous New Bag/Given 05/23/22 1956)  cefTRIAXone (ROCEPHIN) 2 g in sodium chloride 0.9 % 100 mL IVPB (0 g Intravenous Stopped 05/23/22 2110)  azithromycin (ZITHROMAX) 500 mg in sodium chloride 0.9 % 250 mL IVPB (500 mg Intravenous New Bag/Given 05/23/22 2110)  sodium chloride 0.9 % bolus 1,000 mL (1,000 mLs Intravenous New Bag/Given 05/23/22 1956)  influenza vaccine adjuvanted (FLUAD) injection 0.5 mL (0.5 mLs Intramuscular Given 05/24/22 0802)  azithromycin (ZITHROMAX) tablet 500 mg (500 mg Oral Given 05/25/22 5681)    ED Course/ Medical Decision Making/ A&P                           Medical Decision Making Amount and/or Complexity of Data Reviewed Labs: ordered. Radiology: ordered.  Risk Decision regarding hospitalization.  This patient presents to the ED for concern of diarrhea and cough, this involves an extensive number of treatment options, and is a complaint that carries with it a high risk of complications and morbidity.  The differential diagnosis includes pneumonia and viral syndrome   Co morbidities that complicate the patient evaluation  GERD   Additional history obtained:  Additional history obtained from family External records from outside source obtained and reviewed including hospital records   Lab Tests:  I Ordered, and personally interpreted labs.  The pertinent results include: White count 27,000   Imaging Studies ordered:  I ordered imaging studies including chest x-ray I independently visualized and interpreted imaging which showed  infiltrate in the left lower lung I agree with the radiologist interpretation   Cardiac Monitoring: / EKG:  The  patient was maintained on a cardiac monitor.  I personally viewed and interpreted the cardiac monitored which showed an underlying rhythm of: Normal sinus rhythm   Consultations Obtained:  I requested consultation with the hospitalist,  and discussed lab and imaging findings as well as pertinent plan - they recommend: Admit   Problem List / ED Course / Critical interventions / Medication management  Diarrhea, GERD, cough I ordered medication including antibiotics for pneumonia and saline for dehydration Reevaluation of the patient after these medicines showed that the patient improved I have reviewed the patients home medicines and have made adjustments as needed   Social Determinants of Health:  None   Test / Admission - Considered:  None  Patient will be admitted for pneumonia and mild AKI        Final Clinical Impression(s) / ED Diagnoses Final diagnoses:  None    Rx / DC Orders ED Discharge Orders     None         Milton Ferguson, MD 05/25/22 1132

## 2022-05-25 NOTE — Discharge Summary (Signed)
Physician Discharge Summary   Patient: [name] MRN: [id number] DOB: 01/13/1936  Admit date:     05/23/2022  Discharge date: 05/25/22  Discharge Physician: Barton Dubois   PCP: Lemmie Evens, MD   Recommendations at discharge:  Repeat basic metabolic panel to follow electrolytes and renal function Repeat chest x-ray in 6-8 weeks to assure complete resolution of infiltrates Assist patient arranging outpatient follow-up with pulmonologist for further evaluation and management of COPD maintenance therapy.  Discharge Diagnoses: Principal Problem:   CAP (community acquired pneumonia) Active Problems:   Mixed hyperlipidemia   CLL (chronic lymphocytic leukemia) ([place])   COPD with acute exacerbation (HCC)   Paroxysmal atrial fibrillation (HCC)   Chronic combined systolic and diastolic CHF (congestive heart failure) (HCC)   Leukocytosis   CAD (coronary artery disease)   GERD without esophagitis   Hospital Course: As per H&P written by Dr. Josephine Cables on 05/23/2022 [name] is a 87 y.o. female with medical history significant of CLL, paroxysmal atrial fibrillation, HFrEF (LVEF normal on 04/08/2021 was 35 to 40%), COPD.  Right-sided breast cancer, LBBB, CAD who presents emergency department due to the emergency department due to 3-day onset of cough with production of thick white mucus and shortness of breath which worsened this morning due to wheezing and worsening shortness of breath on ambulation.  Patient states that she has been using at home nebulizer and inhaler more frequently since last 3 days the symptoms started.  She denies fever, chills, sick contacts.  Patient lives at home alone and uses cane at baseline.   ED Course:  In the emergency department, BP was 114/101, and other vital signs were within normal range.  Workup in the ED showed normal CBC except for leukocytosis with lymphocyte predominance, BMP was normal except for blood glucose of 119, BUN/creatinine 25/1.07  (baseline creatinine at 0.8-1.0).  Blood culture pending, influenza A, B, SARS coronavirus 2, RSV was negative. Chest x-ray showed increased density in the medial left lower lung fields may be related to tortuous descending thoracic aorta or atelectasis/pneumonia in left lower lobe. Blunting of left lateral CP angle suggests possible small effusion. She was treated with ceftriaxone and azithromycin, IV hydration was provided.  Oncology (Dr. Delton Coombes) was consulted and recommended hospital admission per EDP. Hospitalist was asked to admit patient for further evaluation and management.  Assessment and Plan: 1-community-acquired pneumonia -Chest x-ray demonstrating right middle and lower lobe opacities -Continue the use of mucolytic's, bronchodilators and oral antibiotics to complete therapy. -Continue the use of flutter valve as instructed.   2-COPD exacerbation -Most likely triggered by acute pneumonia infection -Continue antibiotic therapy for underlying bronchiectasis/pneumonia. -Continue bronchodilators, DuoNebs, Pulmicort, steroids tapering and adequate hydration. -No requiring oxygen supplementation currently. -Follow clinical response. -Outpatient follow-up with pulmonology service recommended.   3-leukocytosis -Most likely in the setting of acute infection along with underlying history of CLL -Continue outpatient follow-up with oncology service.   4-paroxysmal atrial fibrillation -Denying palpitations currently -Continue the use of Eliquis for secondary prevention -Due to soft blood pressure continue holding bisoprolol currently. -Follow blood pressure and vital signs.   5-history of chronic systolic and diastolic CHF -Last ejection fraction 35 to 40%; -Follow daily weights and strict I's and O's -Plan to resume home beta-blocker and diuretic regimen -No crackles or complaints of orthopnea currently.   6-coronary artery disease -Patient denies chest pain -Continue patient  follow-up with cardiology service. -Continue treatment with beta-blocker, statin and spironolactone.   7-hyperlipidemia -Continue statin -Heart healthy diet encouraged.  8-gastroesophageal reflux disease -Continue PPI. -Lifestyle changes discussed with patient.     Consultants: none  Procedures performed: See below for x-ray reports. Disposition: Home Diet recommendation: Heart healthy/low-sodium diet  DISCHARGE MEDICATION: Allergies as of 05/25/2022       Reactions   Codeine Nausea And Vomiting   headache   Losartan Nausea Only   Nauseated and headaches   Meclizine Other (See Comments)   Made dizziness worse.   Sulfa Antibiotics Nausea And Vomiting   Stomach upset   Zoloft [sertraline Hcl] Anxiety        Medication List     TAKE these medications    acetaminophen 500 MG tablet Commonly known as: TYLENOL Take 1,000 mg by mouth every 6 (six) hours as needed for moderate pain or headache.   albuterol 108 (90 Base) MCG/ACT inhaler Commonly known as: VENTOLIN HFA Inhale 2 puffs into the lungs every 6 (six) hours as needed for shortness of breath.   albuterol (2.5 MG/3ML) 0.083% nebulizer solution Commonly known as: PROVENTIL Take 3 mLs (2.5 mg total) by nebulization every 6 (six) hours as needed for wheezing or shortness of breath (when not relieved by inhaler.).   amoxicillin-clavulanate 875-125 MG tablet Commonly known as: AUGMENTIN Take 1 tablet by mouth every 12 (twelve) hours for 7 days.   atorvastatin 20 MG tablet Commonly known as: LIPITOR TAKE ONE TABLET BY MOUTH ONCE DAILY.   bisoprolol 5 MG tablet Commonly known as: ZEBETA TAKE (1/2) TABLET BY MOUTH ONCE DAILY.   budesonide-formoterol 160-4.5 MCG/ACT inhaler Commonly known as: Symbicort Inhale 2 puffs into the lungs 2 (two) times daily.   cholecalciferol 25 MCG (1000 UNIT) tablet Commonly known as: VITAMIN D3 Take 1 capsule by mouth daily.   dextromethorphan-guaiFENesin 30-600 MG 12hr  tablet Commonly known as: MUCINEX DM Take 1 tablet by mouth 2 (two) times daily.   diphenhydrAMINE 25 MG tablet Commonly known as: BENADRYL Take 25 mg by mouth daily as needed for allergies.   Eliquis 5 MG Tabs tablet Generic drug: apixaban TAKE 1 TABLET BY MOUTH 2 TIMES DAILY.   losartan 25 MG tablet Commonly known as: COZAAR TAKE (1/2) TABLET BY MOUTH DAILY   Misc. Devices Misc Please provide patient with breast prosthesis and mastectomy bra. Dx: C50.911   omeprazole 20 MG capsule Commonly known as: PRILOSEC Take 20 mg by mouth daily.   ondansetron 4 MG tablet Commonly known as: ZOFRAN Take 1 tablet (4 mg total) by mouth every 8 (eight) hours as needed for nausea or vomiting.   predniSONE 20 MG tablet Commonly known as: DELTASONE Take 2 tablets by mouth daily x 2 das; then 1 tablet by mouth daily x 3 days; then half tablet by mouth daily x 3 days and stop prednisone. Start taking on: May 26, 2022   spironolactone 25 MG tablet Commonly known as: ALDACTONE TAKE (1) TABLET BY MOUTH DAILY.   Veozah 45 MG Tabs Generic drug: Fezolinetant Take 1 tablet by mouth daily.        Follow-up Information     Lemmie Evens, MD. Schedule an appointment as soon as possible for a visit in 10 day(s).   Specialty: Family Medicine Contact information: [place] [address] [phone]                Discharge Exam: Danley Danker Weights   05/23/22 2104 05/24/22 0149  Weight: 74.2 kg 73.6 kg   General exam: Alert, awake, oriented x 3; feeling significantly improved; no chest pain, speaking  in full sentences and not requiring oxygen supplementation.  Patient feeling ready to go home.  No fever Respiratory system: Improved air movement bilaterally; positive scattered rhonchi; no frank crackles and no wheezing on exam. Cardiovascular system: Rate controlled, no rubs, no gallops, no JVD on exam. Gastrointestinal system: Abdomen is nondistended, soft and  nontender. No organomegaly or masses felt. Normal bowel sounds heard. Central nervous system: Alert and oriented. No focal neurological deficits. Extremities: No cyanosis or clubbing. Skin: No petechiae. Psychiatry: Judgement and insight appear normal. Mood & affect appropriate.    Condition at discharge: Stable and improved.  The results of significant diagnostics from this hospitalization (including imaging, microbiology, ancillary and laboratory) are listed below for reference.   Imaging Studies: DG Chest 2 View  Result Date: 05/23/2022 CLINICAL DATA:  Cough, shortness of breath, wheezing EXAM: CHEST - 2 VIEW COMPARISON:  04/07/2021 FINDINGS: Transverse diameter of heart is increased. Increased density is seen in left lower lung field. This may be due to tortuous descending thoracic aorta. Possibility of infiltrate in left lower lung fields is not excluded. There is blunting of left lateral CP angle. There are no signs of alveolar pulmonary edema. There is no pneumothorax. IMPRESSION: Increased density in the medial left lower lung fields may be related to tortuous descending thoracic aorta or atelectasis/pneumonia in left lower lobe. Blunting of left lateral CP angle suggests possible small effusion. Electronically Signed   By: Elmer Picker M.D.   On: 05/23/2022 16:32    Microbiology: Results for orders placed or performed during the hospital encounter of 05/23/22  Resp panel by RT-PCR (RSV, Flu A&B, Covid) Anterior Nasal Swab     Status: None   Collection Time: 05/23/22  4:01 PM   Specimen: Anterior Nasal Swab  Result Value Ref Range Status   SARS Coronavirus 2 by RT PCR NEGATIVE NEGATIVE Final    Comment: (NOTE) SARS-CoV-2 target nucleic acids are NOT DETECTED.  The SARS-CoV-2 RNA is generally detectable in upper respiratory specimens during the acute phase of infection. The lowest concentration of SARS-CoV-2 viral copies this assay can detect is 138 copies/mL. A negative  result does not preclude SARS-Cov-2 infection and should not be used as the sole basis for treatment or other patient management decisions. A negative result may occur with  improper specimen collection/handling, submission of specimen other than nasopharyngeal swab, presence of viral mutation(s) within the areas targeted by this assay, and inadequate number of viral copies(<138 copies/mL). A negative result must be combined with clinical observations, patient history, and epidemiological information. The expected result is Negative.  Fact Sheet for Patients:  EntrepreneurPulse.com.au  Fact Sheet for Healthcare Providers:  IncredibleEmployment.be  This test is no t yet approved or cleared by the [place] FDA and  has been authorized for detection and/or diagnosis of SARS-CoV-2 by FDA under an Emergency Use Authorization (EUA). This EUA will remain  in effect (meaning this test can be used) for the duration of the COVID-19 declaration under Section 564(b)(1) of the Act, 21 U.S.C.section 360bbb-3(b)(1), unless the authorization is terminated  or revoked sooner.       Influenza A by PCR NEGATIVE NEGATIVE Final   Influenza B by PCR NEGATIVE NEGATIVE Final    Comment: (NOTE) The Xpert Xpress SARS-CoV-2/FLU/RSV plus assay is intended as an aid in the diagnosis of influenza from Nasopharyngeal swab specimens and should not be used as a sole basis for treatment. Nasal washings and aspirates are unacceptable for Xpert Xpress SARS-CoV-2/FLU/RSV testing.  Fact Sheet  for Patients: EntrepreneurPulse.com.au  Fact Sheet for Healthcare Providers: IncredibleEmployment.be  This test is not yet approved or cleared by the [place] FDA and has been authorized for detection and/or diagnosis of SARS-CoV-2 by FDA under an Emergency Use Authorization (EUA). This EUA will remain in effect (meaning this test can be used)  for the duration of the COVID-19 declaration under Section 564(b)(1) of the Act, 21 U.S.C. section 360bbb-3(b)(1), unless the authorization is terminated or revoked.     Resp Syncytial Virus by PCR NEGATIVE NEGATIVE Final    Comment: (NOTE) Fact Sheet for Patients: EntrepreneurPulse.com.au  Fact Sheet for Healthcare Providers: IncredibleEmployment.be  This test is not yet approved or cleared by the [place] FDA and has been authorized for detection and/or diagnosis of SARS-CoV-2 by FDA under an Emergency Use Authorization (EUA). This EUA will remain in effect (meaning this test can be used) for the duration of the COVID-19 declaration under Section 564(b)(1) of the Act, 21 U.S.C. section 360bbb-3(b)(1), unless the authorization is terminated or revoked.  Performed at [facility], [address] [place], [place] [address]   Culture, blood (single)     Status: None (Preliminary result)   Collection Time: 05/23/22  8:08 PM   Specimen: BLOOD  Result Value Ref Range Status   Specimen Description   Final    BLOOD Performed at [facility], [address], [place], [place] [address]    Special Requests   Final    NONE right hand  Performed at [facility], [address], [place], [place] [address]    Culture  Setup Time   Final    BLOOD RIGHT HAND BOTTLES DRAWN AEROBIC AND ANAEROBIC Blood Culture adequate volume Performed at [facility], [address] [place], [place] [address]    Culture   Final    NO GROWTH 2 DAYS Performed at [facility], [place] [address] [place], [place] [address]    Report Status PENDING  Incomplete  Expectorated Sputum Assessment w Gram Stain, Rflx to Resp Cult     Status: None   Collection Time: 05/24/22  8:00 AM   Specimen: Sputum  Result Value Ref Range Status   Specimen Description SPUTUM  Final   Special Requests NONE  Final   Sputum evaluation   Final    THIS SPECIMEN IS ACCEPTABLE FOR SPUTUM  CULTURE Performed at [facility], [address] [place], [place] [address]    Report Status 05/24/2022 FINAL  Final  Culture, Respiratory w Gram Stain     Status: None (Preliminary result)   Collection Time: 05/24/22  8:00 AM   Specimen: SPU  Result Value Ref Range Status   Specimen Description   Final    SPUTUM Performed at [facility], [address] [place], [place] [address]    Special Requests   Final    NONE Reflexed from [id number] Performed at [facility], [address], [place], [place] [address]    Gram Stain   Final    NO WBC SEEN RARE GRAM POSITIVE COCCI IN CLUSTERS RARE GRAM POSITIVE COCCI IN CHAINS RARE GRAM NEGATIVE RODS RARE GRAM POSITIVE RODS    Culture   Final    CULTURE REINCUBATED FOR BETTER GROWTH Performed at [facility], [place] [address], [place], [place] [address]    Report Status PENDING  Incomplete    Labs: CBC: Recent Labs  Lab 05/23/22 1731 05/24/22 0444  WBC 27.1* 26.5*  NEUTROABS 6.8  --   HGB  12.1 11.0*  HCT 38.3 35.0*  MCV 97.5 98.0  PLT 208 009   Basic Metabolic Panel: Recent Labs  Lab 05/23/22 1731 05/24/22 0444  NA 140 140  K 4.5 4.5  CL 108 111  CO2 24 20*  GLUCOSE 119* 160*  BUN 25* 24*  CREATININE 1.07* 1.05*  CALCIUM 8.9 8.4*  MG  --  2.0  PHOS  --  3.4   Liver Function Tests: Recent Labs  Lab 05/23/22 1731 05/24/22 0444  AST 19 21  ALT 11 11  ALKPHOS 50 47  BILITOT 0.7 0.5  PROT 6.9 6.2*  ALBUMIN 4.1 3.6   CBG: No results for input(s): "GLUCAP" in the last 168 hours.  Discharge time spent: greater than 30 minutes.  Signed: Barton Dubois, MD [facility] 05/25/2022

## 2022-05-25 NOTE — Progress Notes (Signed)
Pt loss her only IV tonight, multiple attempts tried to insert another IV including ultrasound use failed. This RN, Agricultural consultant and other RNs tried 5-6x tonight and pt starting to refuse another one.  IV antibiotic from 2200h still not done infusing. MD informed and ordered PO form of antibiotic. Pt still has no IV access.

## 2022-05-28 LAB — CULTURE, BLOOD (SINGLE): Culture: NO GROWTH

## 2022-05-29 LAB — CULTURE, RESPIRATORY W GRAM STAIN
Culture: NORMAL
Gram Stain: NONE SEEN

## 2022-05-30 ENCOUNTER — Ambulatory Visit (HOSPITAL_COMMUNITY): Payer: Medicare HMO

## 2022-06-14 ENCOUNTER — Other Ambulatory Visit (HOSPITAL_COMMUNITY): Payer: Self-pay | Admitting: Family Medicine

## 2022-06-14 ENCOUNTER — Ambulatory Visit (HOSPITAL_COMMUNITY)
Admission: RE | Admit: 2022-06-14 | Discharge: 2022-06-14 | Disposition: A | Discharge status: Home / Self Care | Payer: Medicare HMO | Source: Ambulatory Visit | Attending: Family Medicine | Admitting: Family Medicine

## 2022-06-14 DIAGNOSIS — M545 Low back pain, unspecified: Secondary | ICD-10-CM | POA: Diagnosis not present

## 2022-06-14 DIAGNOSIS — Z7901 Long term (current) use of anticoagulants: Secondary | ICD-10-CM | POA: Diagnosis not present

## 2022-06-14 DIAGNOSIS — J189 Pneumonia, unspecified organism: Secondary | ICD-10-CM | POA: Diagnosis not present

## 2022-06-14 DIAGNOSIS — Z710 Person encountering health services to consult on behalf of another person: Secondary | ICD-10-CM | POA: Diagnosis not present

## 2022-06-14 DIAGNOSIS — Z23 Encounter for immunization: Secondary | ICD-10-CM | POA: Diagnosis not present

## 2022-06-16 ENCOUNTER — Ambulatory Visit (HOSPITAL_COMMUNITY)
Admission: RE | Admit: 2022-06-16 | Discharge: 2022-06-16 | Disposition: A | Discharge status: Home / Self Care | Payer: Medicare HMO | Source: Ambulatory Visit | Attending: Hematology | Admitting: Hematology

## 2022-06-16 DIAGNOSIS — Z1231 Encounter for screening mammogram for malignant neoplasm of breast: Secondary | ICD-10-CM | POA: Diagnosis not present

## 2022-06-20 ENCOUNTER — Other Ambulatory Visit: Payer: Self-pay | Admitting: Internal Medicine

## 2022-06-23 ENCOUNTER — Telehealth: Payer: Self-pay | Admitting: Internal Medicine

## 2022-06-23 DIAGNOSIS — I480 Paroxysmal atrial fibrillation: Secondary | ICD-10-CM

## 2022-06-23 MED ORDER — APIXABAN 5 MG PO TABS: 5.0000 mg | ORAL_TABLET | Freq: Two times a day (BID) | ORAL | 1 refills | Status: DC

## 2022-06-23 NOTE — Telephone Encounter (Signed)
*  STAT* If patient is at the pharmacy, call can be transferred to refill team.   1. Which medications need to be refilled? (please list name of each medication and dose if known)   apixaban (ELIQUIS) 5 MG TABS tablet    2. Which pharmacy/location (including street and city if local pharmacy) is medication to be sent to? [place], [address]   3. Do they need a 30 day or 90 day supply? 90  Pt has appt with Dr. Harrington Challenger 02/12.

## 2022-06-23 NOTE — Telephone Encounter (Signed)
Prescription refill request for Eliquis received. Indication: PAF Last office visit: 09/29/21  Veronica Bark MD Scr: 1.05 on 05/24/22 Age: 87 Weight: 76.2kg  Based on above findings Eliquis '5mg'$  twice daily is the appropriate dose.  Refill approved.

## 2022-07-03 NOTE — Progress Notes (Deleted)
Cardiology Office Note    Date:  07/03/2022   ID:  [name], DOB [birth year], MRN [id number]  PCP:  Lemmie Evens, MD  Cardiologist: Dorris Carnes, MD    Patient presents for follow up of PAF and HFrEF   History of Present Illness:    [name] is a 87 y.o. female with past medical history of paroxysmal atrial fibrillation, HFrEF/NICM (EF previously 55-60% in 06/2018, reduced to 40-45% by repeat echo in 03/2019 and at 35-40% by echo in 05/2020), CAD (cath in 06/2020 showing mild to moderate nonobstructive CAD), aortic stenosis, LBBB, CLL and COPD.  The pt has been on Bisoprolol, spironolactone, losartan.  She did not tolerate Entresto or Jardiance    I saw the pt last in Dec 2022   Since then she says that she has been doing good   She says she "feels great"    Breathing good   No CP   NO swelling in legs     No PND   Sleeping OK    Has not had any palpitations   I saw the pt in May 2023     Past Medical History:  Diagnosis Date   Bowel obstruction (HCC)    Breast cancer ([place])    Breast cancer, stage 3 ([place]) 07/21/2011   Bronchitis    CAD (coronary artery disease)    a. cath in 06/2020 showing mild to moderate nonobstructive CAD   CHF (congestive heart failure) ([place])    a. EF previously 55-60% in 06/2018 b. EF reduced to 40-45% by repeat echo in 03/2019 and at 35-40% by echo in 05/2020   CLL (chronic lymphocytic leukemia) ([place]) 07/21/2011   Migraines    PAF (paroxysmal atrial fibrillation) (HCC)    Pneumonia     Past Surgical History:  Procedure Laterality Date   ABDOMINAL HYSTERECTOMY     APPENDECTOMY     CHOLECYSTECTOMY     LAPAROTOMY N/A 06/08/2015   Procedure: EXPLORATORY LAPAROTOMY;  Surgeon: Aviva Signs, MD;  Location: [facility];  Service: General;  Laterality: N/A;   LYSIS OF ADHESION N/A 06/08/2015   Procedure: LYSIS OF ADHESION;  Surgeon: Aviva Signs, MD;  Location: [facility];  Service: General;  Laterality: N/A;   MASTECTOMY     right   RIGHT/LEFT  HEART CATH AND CORONARY ANGIOGRAPHY N/A 07/09/2020   Procedure: RIGHT/LEFT HEART CATH AND CORONARY ANGIOGRAPHY;  Surgeon: Nelva Bush, MD;  Location: [facility];  Service: Cardiovascular;  Laterality: N/A;   TOTAL HIP ARTHROPLASTY     right    Current Medications: Outpatient Medications Prior to Visit  Medication Sig Dispense Refill   acetaminophen (TYLENOL) 500 MG tablet Take 1,000 mg by mouth every 6 (six) hours as needed for moderate pain or headache.     albuterol (PROVENTIL HFA;VENTOLIN HFA) 108 (90 BASE) MCG/ACT inhaler Inhale 2 puffs into the lungs every 6 (six) hours as needed for shortness of breath.     albuterol (PROVENTIL) (2.5 MG/3ML) 0.083% nebulizer solution Take 3 mLs (2.5 mg total) by nebulization every 6 (six) hours as needed for wheezing or shortness of breath (when not relieved by inhaler.). 75 mL 12   apixaban (ELIQUIS) 5 MG TABS tablet Take 1 tablet (5 mg total) by mouth 2 (two) times daily. 180 tablet 1   atorvastatin (LIPITOR) 20 MG tablet TAKE ONE TABLET BY MOUTH ONCE DAILY. 90 tablet 0   bisoprolol (ZEBETA) 5 MG tablet TAKE (1/2) TABLET BY MOUTH ONCE DAILY. [place]  tablet 6   budesonide-formoterol (SYMBICORT) 160-4.5 MCG/ACT inhaler Inhale 2 puffs into the lungs 2 (two) times daily. 1 each 12   cholecalciferol (VITAMIN D3) 25 MCG (1000 UNIT) tablet Take 1 capsule by mouth daily.     dextromethorphan-guaiFENesin (MUCINEX DM) 30-600 MG 12hr tablet Take 1 tablet by mouth 2 (two) times daily. 20 tablet 0   diphenhydrAMINE (BENADRYL) 25 MG tablet Take 25 mg by mouth daily as needed for allergies.     losartan (COZAAR) 25 MG tablet TAKE (1/2) TABLET BY MOUTH DAILY 45 tablet 0   Misc. 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 Dx: C50.911 1 each 0   omeprazole (PRILOSEC) 20 MG capsule Take 20 mg by mouth daily.     ondansetron (ZOFRAN) 4 MG tablet Take 1 tablet (4 mg total) by mouth every 8 (eight) hours as needed for nausea or  vomiting. 20 tablet 0   predniSONE (DELTASONE) 20 MG tablet Take 2 tablets by mouth daily x 2 das; then 1 tablet by mouth daily x 3 days; then half tablet by mouth daily x 3 days and stop prednisone. 10 tablet 0   spironolactone (ALDACTONE) 25 MG tablet TAKE (1) TABLET BY MOUTH DAILY. 90 tablet 3   VEOZAH 45 MG TABS Take 1 tablet by mouth daily.     No facility-administered medications prior to visit.     Allergies:   Codeine, Losartan, Meclizine, Sulfa antibiotics, and Zoloft [sertraline hcl]   Social History   Socioeconomic History   Marital status: Widowed    Spouse name: Not on file   Number of children: Not on file   Years of education: 11   Highest education level: Not on file  Occupational History   Not on file  Tobacco Use   Smoking status: Former    Types: Cigarettes    Quit date: 10/26/1978    Years since quitting: 43.7   Smokeless tobacco: Never  Vaping Use   Vaping Use: Never used  Substance and Sexual Activity   Alcohol use: No   Drug use: No   Sexual activity: Not Currently  Other Topics Concern   Not on file  Social History Narrative   Pt lives by herself   Social Determinants of Health   Financial Resource Strain: Low Risk  (04/09/2020)   Overall Financial Resource Strain (CARDIA)    Difficulty of Paying Living Expenses: Not hard at all  Food Insecurity: No Food Insecurity (05/23/2022)   Hunger Vital Sign    Worried About Running Out of Food in the Last Year: Never true    [place] in the Last Year: Never true  Transportation Needs: No Transportation Needs (05/23/2022)   PRAPARE - Hydrologist (Medical): No    Lack of Transportation (Non-Medical): No  Physical Activity: Inactive (04/09/2020)   Exercise Vital Sign    Days of Exercise per Week: 0 days    Minutes of Exercise per Session: 0 min  Stress: Stress Concern Present (06/27/2018)   [place]     Feeling of Stress : Rather much  Social Connections: Socially Isolated (04/09/2020)   Social Connection and Isolation Panel [NHANES]    Frequency of Communication with Friends and Family: More than three times a week    Frequency of Social Gatherings with Friends and Family: Twice a week    Attends Religious Services: Never    Active Member of  Clubs or Organizations: No    Attends Archivist Meetings: Never    Marital Status: Widowed     Family History:  The patient's family history includes Cancer in her brother and another family member.   Review of Systems:    Please see the history of present illness.     All other systems reviewed and are otherwise negative except as noted above.   Physical Exam:    VS:  There were no vitals taken for this visit.   General: Obese 87 year old woman in no acute distress. Head: Normocephalic, atraumatic. Neck: No carotid bruits. JVD not elevated.  Lungs: Clear to auscultation  heart: Regular rate and rhythm.  2/6 SEM along RUSB.  Abdomen: Obese   Nontender  Msk: Moving all extremities  Extremities:  No pitting edema. . Neuro: Alert and oriented X 3. Moves all extremities spontaneously. No focal deficits noted. Psych:  Responds to questions appropriately with a normal affect. Skin: No rashes or lesions noted  Wt Readings from Last 3 Encounters:  05/24/22 162 lb 3.2 oz (73.6 kg)  05/03/22 163 lb 9.3 oz (74.2 kg)  11/01/21 166 lb 3.2 oz (75.4 kg)     Studies/Labs Reviewed:   EKG:  EKG is not ordered today.   Recent Labs: 11/15/2021: TSH 2.056 05/24/2022: ALT 11; BUN 24; Creatinine, Ser 1.05; Hemoglobin 11.0; Magnesium 2.0; Platelets 179; Potassium 4.5; Sodium 140   Lipid Panel    Component Value Date/Time   CHOL 107 11/15/2021 1044   TRIG 75 11/15/2021 1044   HDL 43 11/15/2021 1044   CHOLHDL 2.5 11/15/2021 1044   VLDL 15 11/15/2021 1044   LDLCALC 49 11/15/2021 1044    Additional studies/ records that were reviewed  today include:   Cardiac Catheterization: 07/09/2020 Conclusions: Mild to moderate, non-obstructive coronary artery disease with 30-40% mid LAD stenosis and mild luminal irregularities involving the LCx and RCA. Severely reduced left ventricular systolic function (LVEF [id number]). Upper normal left heart, right heart, and pulmonary artery pressures. Normal Fick cardiac output/index. Mild aortic stenosis.   Recommendations: Medical therapy and risk factor modification to prevent progression of coronary artery disease. Escalate goal directed medical therapy for treatment of nonischemic cardiomyopathy, as tolerated. Restart apixaban tomorrow morning if no evidence of bleeding/vascular complication.    Echocardiogram: 04/08/21    1. Left ventricular ejection fraction, by estimation, is 35 to 40%. The  left ventricle has moderately decreased function. The left ventricle  demonstrates regional wall motion abnormalities (see scoring  diagram/findings for description). There is  moderate left ventricular hypertrophy. Left ventricular diastolic  parameters are consistent with Grade I diastolic dysfunction (impaired  relaxation). Elevated left atrial pressure.   2. Right ventricular systolic function is normal. The right ventricular  size is normal. There is mildly elevated pulmonary artery systolic  pressure.   3. Left atrial size was moderately dilated.   4. Right atrial size was mild to moderately dilated.   5. The mitral valve is abnormal. Mild mitral valve regurgitation. Mild  mitral stenosis. Moderate mitral annular calcification.   6. The aortic valve is tricuspid. There is moderate calcification of the  aortic valve. There is moderate thickening of the aortic valve. Aortic  valve regurgitation is not visualized. Mild aortic valve stenosis.   FINDINGS   Left Ventricle: The anteroseptum, anterior    1. Left ventricular ejection fraction, by estimation, is 35 to 40%. The  left  ventricle has moderately decreased function. The left ventricle  demonstrates regional wall motion  abnormalities (see scoring  diagram/findings for description). The left  ventricular internal cavity size was mildly dilated. Left ventricular  diastolic parameters are consistent with Grade I diastolic dysfunction  (impaired relaxation).   2. Right ventricular systolic function is normal. The right ventricular  size is normal. Tricuspid regurgitation signal is inadequate for assessing  PA pressure.   3. Mild mitral valve regurgitation.   4. The aortic valve is tricuspid. There is moderate calcification of the  aortic valve. Aortic valve regurgitation is not visualized. Mild aortic  valve stenosis. Aortic valve mean gradient measures 10.5 mmHg.  Dimentionless index 0.46.   5. The inferior vena cava is normal in size with greater than 50%  respiratory variability, suggesting right atrial pressure of 3 mmHg.  Assessment:    No diagnosis found.    Plan:   In order of problems listed above:  1. HFrEF/NICM  - Her EF remains reduced at 35 to 40% Tolerating current meds I would not adjust anything since she is feelng soo good   2. Paroxysmal Atrial Fibrillation - She denies any recent palpitations.   Continue Bisoprolol 5 mg daily and Eliquis   3. CAD - She had mild to moderate nonobstructive CAD by catheterization in 06/2020. NO compllaints of CP   Continue Atorvastatin 20 mg daily and Bisoprolol 5 mg daily. She is not on ASA given the need for anticoagulation  4. Aortic Stenosis -Continue to follow with periodic echoes.  MIld on echo in Nov 2022   5. LIpids   LDL excellent on recent labs   6  CLL - Followed by Oncology.   Follow-up next winter  Medication Adjustments/Labs and Tests Ordered: Current medicines are reviewed at length with the patient today.  Concerns regarding medicines are outlined above.  Medication changes, Labs and Tests ordered today are listed in the Patient  Instructions below. There are no Patient Instructions on file for this visit.   Signed, Dorris Carnes, MD  07/03/2022 9:31 PM    [facility]. [address] [place], [place] [address] Phone: [phone] Fax: [phone]                                                      .Marland Kitchen.....................................................................................................................................................................................................................................................................................................................................................................................................................................................   Cardiology Office Note    Date:  07/03/2022   ID:  [name], DOB 08/20/1935, MRN [id number]  PCP:  Lemmie Evens, MD  Cardiologist: Dorris Carnes, MD    Patient presents for follow up of PAF and HFrEF   History of Present Illness:    [name] is a 87 y.o. female with past medical history of paroxysmal atrial fibrillation, HFrEF/NICM (EF previously 55-60% in 06/2018, reduced to 40-45% by repeat echo in 03/2019 and at 35-40% by echo in 05/2020), CAD (cath in 06/2020 showing mild to moderate nonobstructive CAD), aortic stenosis, LBBB, CLL and COPD.  The pt has been on Bisoprolol, spironolactone, losartan.  She did not tolerate Entresto or Jardiance    She was seen in clinic on 05/13/21  Nauseated  Vomited  Sent tp ED and then sent home  Pt says symptoms resolved after a couple days   No real testing done   Rx for N/V given     Since then she has done good  Today she feels great   Breathing  good   No CP   NO swelling    NO PND   Sleeping OK    Has not had any palpitations   I saw the pt in clinic in Jan 2023      Past Medical History:  Diagnosis Date   Bowel obstruction (HCC)    Breast  cancer ([place])    Breast cancer, stage 3 ([place]) 07/21/2011   Bronchitis    CAD (coronary artery disease)    a. cath in 06/2020 showing mild to moderate nonobstructive CAD   CHF (congestive heart failure) ([place])    a. EF previously 55-60% in 06/2018 b. EF reduced to 40-45% by repeat echo in 03/2019 and at 35-40% by echo in 05/2020   CLL (chronic lymphocytic leukemia) ([place]) 07/21/2011   Migraines    PAF (paroxysmal atrial fibrillation) (HCC)    Pneumonia     Past Surgical History:  Procedure Laterality Date   ABDOMINAL HYSTERECTOMY     APPENDECTOMY     CHOLECYSTECTOMY     LAPAROTOMY N/A 06/08/2015   Procedure: EXPLORATORY LAPAROTOMY;  Surgeon: Aviva Signs, MD;  Location: [facility];  Service: General;  Laterality: N/A;   LYSIS OF ADHESION N/A 06/08/2015   Procedure: LYSIS OF ADHESION;  Surgeon: Aviva Signs, MD;  Location: [facility];  Service: General;  Laterality: N/A;   MASTECTOMY     right   RIGHT/LEFT HEART CATH AND CORONARY ANGIOGRAPHY N/A 07/09/2020   Procedure: RIGHT/LEFT HEART CATH AND CORONARY ANGIOGRAPHY;  Surgeon: Nelva Bush, MD;  Location: [facility];  Service: Cardiovascular;  Laterality: N/A;   TOTAL HIP ARTHROPLASTY     right    Current Medications: Outpatient Medications Prior to Visit  Medication Sig Dispense Refill   acetaminophen (TYLENOL) 500 MG tablet Take 1,000 mg by mouth every 6 (six) hours as needed for moderate pain or headache.     albuterol (PROVENTIL HFA;VENTOLIN HFA) 108 (90 BASE) MCG/ACT inhaler Inhale 2 puffs into the lungs every 6 (six) hours as needed for shortness of breath.     albuterol (PROVENTIL) (2.5 MG/3ML) 0.083% nebulizer solution Take 3 mLs (2.5 mg total) by nebulization every 6 (six) hours as needed for wheezing or shortness of breath (when not relieved by inhaler.). 75 mL 12   apixaban (ELIQUIS) 5 MG TABS tablet Take 1 tablet (5 mg total) by mouth 2 (two) times daily. 180 tablet 1   atorvastatin (LIPITOR) 20 MG tablet TAKE ONE TABLET BY  MOUTH ONCE DAILY. 90 tablet 0   bisoprolol (ZEBETA) 5 MG tablet TAKE (1/2) TABLET BY MOUTH ONCE DAILY. 30 tablet 6   budesonide-formoterol (SYMBICORT) 160-4.5 MCG/ACT inhaler Inhale 2 puffs into the lungs 2 (two) times daily. 1 each 12   cholecalciferol (VITAMIN D3) 25 MCG (1000 UNIT) tablet Take 1 capsule by mouth daily.     dextromethorphan-guaiFENesin (MUCINEX DM) 30-600 MG 12hr tablet Take 1 tablet by mouth 2 (two) times daily. 20 tablet 0   diphenhydrAMINE (BENADRYL) 25 MG tablet Take 25 mg by mouth daily as needed for allergies.     losartan (COZAAR) 25 MG tablet TAKE (1/2) TABLET BY MOUTH DAILY 45 tablet 0   Misc. 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 Dx: C50.911 1 each 0   omeprazole (PRILOSEC) 20 MG capsule Take 20 mg by mouth daily.     ondansetron (ZOFRAN) 4 MG tablet Take 1 tablet (4 mg total) by mouth every 8 (eight) hours as needed for nausea or vomiting. [place]  tablet 0   predniSONE (DELTASONE) 20 MG tablet Take 2 tablets by mouth daily x 2 das; then 1 tablet by mouth daily x 3 days; then half tablet by mouth daily x 3 days and stop prednisone. 10 tablet 0   spironolactone (ALDACTONE) 25 MG tablet TAKE (1) TABLET BY MOUTH DAILY. 90 tablet 3   VEOZAH 45 MG TABS Take 1 tablet by mouth daily.     No facility-administered medications prior to visit.     Allergies:   Codeine, Losartan, Meclizine, Sulfa antibiotics, and Zoloft [sertraline hcl]   Social History   Socioeconomic History   Marital status: Widowed    Spouse name: Not on file   Number of children: Not on file   Years of education: 11   Highest education level: Not on file  Occupational History   Not on file  Tobacco Use   Smoking status: Former    Types: Cigarettes    Quit date: 10/26/1978    Years since quitting: 43.7   Smokeless tobacco: Never  Vaping Use   Vaping Use: Never used  Substance and Sexual Activity   Alcohol use: No   Drug use: No   Sexual activity: Not  Currently  Other Topics Concern   Not on file  Social History Narrative   Pt lives by herself   Social Determinants of Health   Financial Resource Strain: Low Risk  (04/09/2020)   Overall Financial Resource Strain (CARDIA)    Difficulty of Paying Living Expenses: Not hard at all  Food Insecurity: No Food Insecurity (05/23/2022)   Hunger Vital Sign    Worried About Running Out of Food in the Last Year: Never true    [place] in the Last Year: Never true  Transportation Needs: No Transportation Needs (05/23/2022)   PRAPARE - Hydrologist (Medical): No    Lack of Transportation (Non-Medical): No  Physical Activity: Inactive (04/09/2020)   Exercise Vital Sign    Days of Exercise per Week: 0 days    Minutes of Exercise per Session: 0 min  Stress: Stress Concern Present (06/27/2018)   [place]    Feeling of Stress : Rather much  Social Connections: Socially Isolated (04/09/2020)   Social Connection and Isolation Panel [NHANES]    Frequency of Communication with Friends and Family: More than three times a week    Frequency of Social Gatherings with Friends and Family: Twice a week    Attends Religious Services: Never    Marine scientist or Organizations: No    Attends Archivist Meetings: Never    Marital Status: Widowed     Family History:  The patient's family history includes Cancer in her brother and another family member.   Review of Systems:    Please see the history of present illness.     All other systems reviewed and are otherwise negative except as noted above.   Physical Exam:    VS:  There were no vitals taken for this visit.   General: Obese 87 year old woman in no acute distress. Head: Normocephalic, atraumatic. Neck: No carotid bruits. JVD not elevated.  Lungs: Clear to auscultation  heart: Regular rate and rhythm.  2/6 SEM along RUSB.   Abdomen: Obese   Nontender  Msk: Moving all extremities  Extremities:  No pitting edema. . Neuro: Alert and oriented X 3. Moves all extremities spontaneously. No focal deficits noted. Psych:  Responds to questions appropriately with a normal affect. Skin: No rashes or lesions noted  Wt Readings from Last 3 Encounters:  05/24/22 162 lb 3.2 oz (73.6 kg)  05/03/22 163 lb 9.3 oz (74.2 kg)  11/01/21 166 lb 3.2 oz (75.4 kg)     Studies/Labs Reviewed:   EKG:  EKG is not ordered today.   Recent Labs: 11/15/2021: TSH 2.056 05/24/2022: ALT 11; BUN 24; Creatinine, Ser 1.05; Hemoglobin 11.0; Magnesium 2.0; Platelets 179; Potassium 4.5; Sodium 140   Lipid Panel    Component Value Date/Time   CHOL 107 11/15/2021 1044   TRIG 75 11/15/2021 1044   HDL 43 11/15/2021 1044   CHOLHDL 2.5 11/15/2021 1044   VLDL 15 11/15/2021 1044   LDLCALC 49 11/15/2021 1044    Additional studies/ records that were reviewed today include:   Cardiac Catheterization: 07/09/2020 Conclusions: Mild to moderate, non-obstructive coronary artery disease with 30-40% mid LAD stenosis and mild luminal irregularities involving the LCx and RCA. Severely reduced left ventricular systolic function (LVEF [id number]). Upper normal left heart, right heart, and pulmonary artery pressures. Normal Fick cardiac output/index. Mild aortic stenosis.   Recommendations: Medical therapy and risk factor modification to prevent progression of coronary artery disease. Escalate goal directed medical therapy for treatment of nonischemic cardiomyopathy, as tolerated. Restart apixaban tomorrow morning if no evidence of bleeding/vascular complication.    Echocardiogram: 12/21/2020 IMPRESSIONS     1. Left ventricular ejection fraction, by estimation, is 35 to 40%. The  left ventricle has moderately decreased function. The left ventricle  demonstrates regional wall motion abnormalities (see scoring  diagram/findings for description). The left   ventricular internal cavity size was mildly dilated. Left ventricular  diastolic parameters are consistent with Grade I diastolic dysfunction  (impaired relaxation).   2. Right ventricular systolic function is normal. The right ventricular  size is normal. Tricuspid regurgitation signal is inadequate for assessing  PA pressure.   3. Mild mitral valve regurgitation.   4. The aortic valve is tricuspid. There is moderate calcification of the  aortic valve. Aortic valve regurgitation is not visualized. Mild aortic  valve stenosis. Aortic valve mean gradient measures 10.5 mmHg.  Dimentionless index 0.46.   5. The inferior vena cava is normal in size with greater than 50%  respiratory variability, suggesting right atrial pressure of 3 mmHg.   Echo:  04/08/21 eft ventricular ejection fraction, by estimation, is 35 to 40%. The left ventricle has moderately decreased function. The left ventricle demonstrates regional wall motion abnormalities (see scoring diagram/findings for description). There is moderate left ventricular hypertrophy. Left ventricular diastolic parameters are consistent with Grade I diastolic dysfunction (impaired relaxation). Elevated left atrial pressure. 1. Right ventricular systolic function is normal. The right ventricular size is normal. There is mildly elevated pulmonary artery systolic pressure. 2. 3. Left atrial size was moderately dilated. 4. Right atrial size was mild to moderately dilated. The mitral valve is abnormal. Mild mitral valve regurgitation. Mild mitral stenosis. Moderate mitral annular calcification. 5. The aortic valve is tricuspid. There is moderate calcification of the aortic valve. There is moderate thickening of the aortic valve. Aortic valve regurgitation is not visualized. Mild aortic valve stenosis.  Assessment:    No diagnosis found.    Plan:   In order of problems listed above:  1. HFrEF/NICM  - Her EF remains reduced at 35 to  40% by most recent echocardiogram  Doing well  Feels great I would keep on current regimen   She did not tolerate Jardiance  or Entresto in past     2. Paroxysmal Atrial Fibrillation - She denies any recent palpitations.   Continue Bisoprolol 5 mg daily for rate control. Keep on current dose of Eliquis. 3. CAD - She had mild to moderate nonobstructive CAD by recent catheterization in 06/2020. She denies any recent anginal symptoms. Continue Atorvastatin 20 mg daily and Bisoprolol 5 mg daily. She is not on ASA given the need for anticoagulation  4. Aortic Stenosis -Continue to follow with periodic echoes.  MIld on recent echo    5. LIpids   LDL excellent on recent labs   6  CLL - Followed by Oncology.   Follow-up at end of May 2023 Medication Adjustments/Labs and Tests Ordered: Current medicines are reviewed at length with the patient today.  Concerns regarding medicines are outlined above.  Medication changes, Labs and Tests ordered today are listed in the Patient Instructions below. There are no Patient Instructions on file for this visit.   Signed, Dorris Carnes, MD  07/03/2022 9:31 PM    [facility]. [address] [place], [place] [address] Phone: [phone] Fax: [phone]

## 2022-07-04 ENCOUNTER — Ambulatory Visit: Payer: Medicare HMO | Admitting: Internal Medicine

## 2022-07-07 DIAGNOSIS — M25562 Pain in left knee: Secondary | ICD-10-CM | POA: Diagnosis not present

## 2022-07-07 DIAGNOSIS — M1712 Unilateral primary osteoarthritis, left knee: Secondary | ICD-10-CM | POA: Diagnosis not present

## 2022-07-18 DIAGNOSIS — R32 Unspecified urinary incontinence: Secondary | ICD-10-CM | POA: Diagnosis not present

## 2022-07-18 DIAGNOSIS — E785 Hyperlipidemia, unspecified: Secondary | ICD-10-CM | POA: Diagnosis not present

## 2022-07-18 DIAGNOSIS — D6869 Other thrombophilia: Secondary | ICD-10-CM | POA: Diagnosis not present

## 2022-07-18 DIAGNOSIS — M1990 Unspecified osteoarthritis, unspecified site: Secondary | ICD-10-CM | POA: Diagnosis not present

## 2022-07-18 DIAGNOSIS — J449 Chronic obstructive pulmonary disease, unspecified: Secondary | ICD-10-CM | POA: Diagnosis not present

## 2022-07-18 DIAGNOSIS — R269 Unspecified abnormalities of gait and mobility: Secondary | ICD-10-CM | POA: Diagnosis not present

## 2022-07-18 DIAGNOSIS — I4891 Unspecified atrial fibrillation: Secondary | ICD-10-CM | POA: Diagnosis not present

## 2022-07-18 DIAGNOSIS — N951 Menopausal and female climacteric states: Secondary | ICD-10-CM | POA: Diagnosis not present

## 2022-07-18 DIAGNOSIS — R609 Edema, unspecified: Secondary | ICD-10-CM | POA: Diagnosis not present

## 2022-07-18 DIAGNOSIS — I10 Essential (primary) hypertension: Secondary | ICD-10-CM | POA: Diagnosis not present

## 2022-07-18 DIAGNOSIS — K219 Gastro-esophageal reflux disease without esophagitis: Secondary | ICD-10-CM | POA: Diagnosis not present

## 2022-07-18 DIAGNOSIS — I2510 Atherosclerotic heart disease of native coronary artery without angina pectoris: Secondary | ICD-10-CM | POA: Diagnosis not present

## 2022-07-28 ENCOUNTER — Ambulatory Visit: Payer: Medicare HMO | Attending: Internal Medicine | Admitting: Nurse Practitioner

## 2022-07-28 ENCOUNTER — Encounter: Payer: Self-pay | Admitting: Nurse Practitioner

## 2022-07-28 VITALS — BP 110/64 | HR 63 | Ht 61.0 in | Wt 153.0 lb

## 2022-07-28 DIAGNOSIS — E785 Hyperlipidemia, unspecified: Secondary | ICD-10-CM

## 2022-07-28 DIAGNOSIS — I471 Supraventricular tachycardia, unspecified: Secondary | ICD-10-CM

## 2022-07-28 DIAGNOSIS — I480 Paroxysmal atrial fibrillation: Secondary | ICD-10-CM

## 2022-07-28 DIAGNOSIS — I5022 Chronic systolic (congestive) heart failure: Secondary | ICD-10-CM

## 2022-07-28 DIAGNOSIS — I2510 Atherosclerotic heart disease of native coronary artery without angina pectoris: Secondary | ICD-10-CM | POA: Diagnosis not present

## 2022-07-28 MED ORDER — ATORVASTATIN CALCIUM 20 MG PO TABS: 20.0000 mg | ORAL_TABLET | Freq: Every day | ORAL | 3 refills | Status: DC

## 2022-07-28 MED ORDER — LOSARTAN POTASSIUM 25 MG PO TABS: ORAL_TABLET | ORAL | 3 refills | Status: DC

## 2022-07-28 NOTE — Patient Instructions (Signed)
Medication Instructions:  Your physician recommends that you continue on your current medications as directed. Please refer to the Current Medication list given to you today.  *If you need a refill on your cardiac medications before your next appointment, please call your pharmacy*   Lab Work: NONE   If you have labs (blood work) drawn today and your tests are completely normal, you will receive your results only by: [place] (if you have MyChart) OR A paper copy in the mail If you have any lab test that is abnormal or we need to change your treatment, we will call you to review the results.   Testing/Procedures: NONE    Follow-Up: At [facility], you and your health needs are our priority.  As part of our continuing mission to provide you with exceptional heart care, we have created designated Provider Care Teams.  These Care Teams include your primary Cardiologist (physician) and Advanced Practice Providers (APPs -  Physician Assistants and Nurse Practitioners) who all work together to provide you with the care you need, when you need it.  We recommend signing up for the patient portal called "MyChart".  Sign up information is provided on this After Visit Summary.  MyChart is used to connect with patients for Virtual Visits (Telemedicine).  Patients are able to view lab/test results, encounter notes, upcoming appointments, etc.  Non-urgent messages can be sent to your provider as well.   To learn more about what you can do with MyChart, go to [email or web address].    Your next appointment:   6 month(s)  Provider:   Dorris Carnes, MD    Other Instructions Thank you for choosing [facility]!

## 2022-07-28 NOTE — Progress Notes (Signed)
Office Visit    Patient Name: [name] Date of Encounter: 07/28/2022  Primary Care Provider:  Lemmie Evens, MD Primary Cardiologist:  Veronica Carnes, MD  Chief Complaint    87 year old female with history of paroxysmal atrial fibrillation, moderate, nonobstructive CAD, chronic HFrEF, nonischemic cardiomyopathy, PSVT, COPD, left bundle branch Block, and CLL, who presents for follow-up related to atrial fibrillation.  Past Medical History    Past Medical History:  Diagnosis Date   Bowel obstruction ([place])    Breast cancer ([place])    Breast cancer, stage 3 ([place]) 07/21/2011   Bronchitis    CAD (coronary artery disease)    a. 06/2020 Cath: LM nl, LAD 65m LCX min irregs, RCA mild diff dzs. PA 34/15(21), PCWP 15. CO 5.0 L/min. EF 25-35%, glob HK.   Chronic heart failure with preserved ejection fraction (HFpEF) ([facility]    a. 06/2018 Echo: EF 55-60%; b. 03/2019 Echo: EF 40-45%; c. 05/2020 Echo: EF 35-40%; d. 12/2020 Echo: EF 35-40%; e. 03/2021 Echo: EF 35-40%. mod LVH,GrI DD, nl RV fxn, mod dil LA/RA, mild MR/AS.   CLL (chronic lymphocytic leukemia) ([facility] 07/21/2011   Migraines    NICM (nonischemic cardiomyopathy) ([facility])    PAF (paroxysmal atrial fibrillation) ([facility]    a. Dx 06/2018 in setting of COPD flare-->bb/eliquis (CHA2DS2VASc = 6); b. 02/2020 Zio: Predominantly sinus rhythm at an average heart rate of 60 (39-190).  3% atrial fibrillation burden and average of 127 (59-1 and 37), longest lasting 9 hours and 23 minutes.  43 runs of PSVT noted.  3 runs of nonsustained VT noted.   Pneumonia    PSVT (paroxysmal supraventricular tachycardia)    a. 02/2020 Zio: 43 rounds of symptomatic PSVT.  Longest 19 beats; fastest 164 bpm x 14 beats.   Past Surgical History:  Procedure Laterality Date   ABDOMINAL HYSTERECTOMY     APPENDECTOMY     CHOLECYSTECTOMY     LAPAROTOMY N/A 06/08/2015   Procedure: EXPLORATORY LAPAROTOMY;  Surgeon: Veronica Signs MD;  Location: [facility];  Service: General;   Laterality: N/A;   LYSIS OF ADHESION N/A 06/08/2015   Procedure: LYSIS OF ADHESION;  Surgeon: Veronica Signs MD;  Location: [facility];  Service: General;  Laterality: N/A;   MASTECTOMY     right   RIGHT/LEFT HEART CATH AND CORONARY ANGIOGRAPHY N/A 07/09/2020   Procedure: RIGHT/LEFT HEART CATH AND CORONARY ANGIOGRAPHY;  Surgeon: Veronica Bush MD;  [facility];  Service: Cardiovascular;  Laterality: N/A;   TOTAL HIP ARTHROPLASTY     right    Allergies  Allergies  Allergen Reactions   Codeine Nausea And Vomiting    headache   Losartan Nausea Only    Nauseated and headaches   Meclizine Other (See Comments)    Made dizziness worse.   Sulfa Antibiotics Nausea And Vomiting    Stomach upset   Zoloft [Sertraline Hcl] Anxiety    History of Present Illness    87year-old female with history of paroxysmal atrial fibrillation, moderate, nonobstructive CAD, chronic HFrEF, nonischemic cardiomyopathy, PSVT, COPD, left bundle branch block, mild aortic stenosis, and CLL.  [facility] with rapid ventricular response.  Echo showed an EF of 55 to 60% with mild aortic stenosis.  She converted back to sinus rhythm was managed with beta-blocker and Eliquis.  She was readmitted in November 2020 with heart failure and volume excess.  Echo showed new reduction in EF  to 40-45%.  She was medically managed.  Event monitoring in October 2021, showed symptomatic PSVT and PAF (3% burden).  She was referred to A-fib clinic and her beta-blocker dose was titrated while also being placed on short acting diltiazem.  A follow-up echocardiogram in January 2022 showed worsening of EF at 35 to 40% with anterior septal and anterior hypokinesis.  She subsequently underwent stress testing which showed prior inferior and inferoapical infarct with moderate peri-infarct ischemia.  Diltiazem was discontinued.  She underwent diagnostic  catheterization which showed mild, nonobstructive CAD, and she was medically managed.  She did not tolerate Entresto or Jardiance.  Most recent echo in November 2022 continues to show an EF of 35 to 40% with moderate LVH, grade 1 diastolic dysfunction, and mild MR/AAS.  [name] was last seen in cardiology clinic in May 2023, at which time she was euvolemic and feeling well.  In January of this year, she was briefly admitted with community-acquired pneumonia and discharged home on mucolytic's, bronchodilators, and oral antibiotics.  It was noted that blood pressure was soft and her bisoprolol was held during her brief stay but resumed at discharge.  Following discharge, in the setting of significant cough, she developed low back pain.  She was seen by her primary care provider and had an x-ray of her lumbar spine which showed new compression deformity of L4.  Symptoms have steadily been improving over the past 6 weeks and although it still hurts some to get up, she is able to walk without pain now.  From a cardiac standpoint, she has done well.  She denies chest pain, dyspnea, palpitations, PND, orthopnea, dizziness, syncope, edema, or early satiety.  She is in need of refills on her atorvastatin and losartan.  Home Medications    Current Outpatient Medications  Medication Sig Dispense Refill   acetaminophen (TYLENOL) 500 MG tablet Take 1,000 mg by mouth every 6 (six) hours as needed for moderate pain or headache.     albuterol (PROVENTIL HFA;VENTOLIN HFA) 108 (90 BASE) MCG/ACT inhaler Inhale 2 puffs into the lungs every 6 (six) hours as needed for shortness of breath.     albuterol (PROVENTIL) (2.5 MG/3ML) 0.083% nebulizer solution Take 3 mLs (2.5 mg total) by nebulization every 6 (six) hours as needed for wheezing or shortness of breath (when not relieved by inhaler.). 75 mL 12   apixaban (ELIQUIS) 5 MG TABS tablet Take 1 tablet (5 mg total) by mouth 2 (two) times daily. 180 tablet 1    atorvastatin (LIPITOR) 20 MG tablet TAKE ONE TABLET BY MOUTH ONCE DAILY. 90 tablet 0   bisoprolol (ZEBETA) 5 MG tablet TAKE (1/2) TABLET BY MOUTH ONCE DAILY. 30 tablet 6   budesonide-formoterol (SYMBICORT) 160-4.5 MCG/ACT inhaler Inhale 2 puffs into the lungs 2 (two) times daily. 1 each 12   cholecalciferol (VITAMIN D3) 25 MCG (1000 UNIT) tablet Take 1 capsule by mouth daily.     diphenhydrAMINE (BENADRYL) 25 MG tablet Take 25 mg by mouth daily as needed for allergies.     losartan (COZAAR) 25 MG tablet TAKE (1/2) TABLET BY MOUTH DAILY 45 tablet 0   Misc. 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 Dx: C50.911 1 each 0   omeprazole (PRILOSEC) 20 MG capsule Take 20 mg by mouth daily.     ondansetron (ZOFRAN) 4 MG tablet Take 1 tablet (4 mg total) by mouth every 8 (eight) hours as needed for nausea or vomiting. 20 tablet 0  spironolactone (ALDACTONE) 25 MG tablet TAKE (1) TABLET BY MOUTH DAILY. 90 tablet 3   VEOZAH 45 MG TABS Take 1 tablet by mouth daily.     dextromethorphan-guaiFENesin (MUCINEX DM) 30-600 MG 12hr tablet Take 1 tablet by mouth 2 (two) times daily. (Patient not taking: Reported on 07/28/2022) 20 tablet 0   predniSONE (DELTASONE) 20 MG tablet Take 2 tablets by mouth daily x 2 das; then 1 tablet by mouth daily x 3 days; then half tablet by mouth daily x 3 days and stop prednisone. (Patient not taking: Reported on 07/28/2022) 10 tablet 0   No current facility-administered medications for this visit.     Review of Systems    Back pain in the setting of compression fracture, which is improving.  She denies chest pain, dyspnea, palpitations, PND, orthopnea, dizziness, syncope, edema, or early satiety.  All other systems reviewed and are otherwise negative except as noted above.    Physical Exam    VS:  BP 110/64   Pulse 63   Ht '5\' 1"'$  (1.549 m)   Wt 153 lb (69.4 kg)   SpO2 100%   BMI 28.91 kg/m  , BMI Body mass index is 28.91 kg/m.     GEN:  Well nourished, well developed, in no acute distress. HEENT: normal. Neck: Supple, no JVD, carotid bruits, or masses. Cardiac: RRR, 1/6 systolic murmur at the upper sternal borders, no rubs or gallops.  No clubbing, cyanosis, edema.  Radials 2+/PT 2+ and equal bilaterally.  Respiratory:  Respirations regular and unlabored, clear to auscultation bilaterally. GI: Soft, nontender, nondistended, BS + x 4. MS: no deformity or atrophy. Skin: warm and dry, no rash. Neuro:  Strength and sensation are intact. Psych: Normal affect.  Accessory Clinical Findings    ECG personally reviewed by me today -sinus arrhythmia, 63, left bundle branch block- no acute changes.  Lab Results  Component Value Date   WBC 26.5 (H) 05/24/2022   HGB 11.0 (L) 05/24/2022   HCT 35.0 (L) 05/24/2022   MCV 98.0 05/24/2022   PLT 179 05/24/2022   Lab Results  Component Value Date   CREATININE 1.05 (H) 05/24/2022   BUN 24 (H) 05/24/2022   NA 140 05/24/2022   K 4.5 05/24/2022   CL 111 05/24/2022   CO2 20 (L) 05/24/2022   Lab Results  Component Value Date   ALT 11 05/24/2022   AST 21 05/24/2022   ALKPHOS 47 05/24/2022   BILITOT 0.5 05/24/2022   Lab Results  Component Value Date   CHOL 107 11/15/2021   HDL 43 11/15/2021   LDLCALC 49 11/15/2021   TRIG 75 11/15/2021   CHOLHDL 2.5 11/15/2021    Lab Results  Component Value Date   HGBA1C 5.7 (H) 11/15/2021    Assessment & Plan    1.  Nonischemic cardiomyopathy/chronic HFrEF: EF 35 to 40% per most recent echo November 2022.  Diagnostic catheterization in February 2022 showed mild, nonobstructive CAD.  She has been medically managed and is euvolemic on examination today.  She has been tolerating bisoprolol, losartan, and spironolactone well.  She previously did not tolerate either Entresto or Jardiance.  Lab work stable in January.  2.  Paroxysmal atrial fibrillation: No recent palpitations.  In sinus rhythm today.  She remains on bisoprolol and Eliquis  therapy.  Stable lab work in January.  3.  Nonobstructive CAD: Catheterization in February 2022 showed mild, nonobstructive CAD.  She does not experience chest pain or dyspnea.  She remains on beta-blocker  and statin therapy.  No aspirin in the setting of chronic Eliquis.  4.  Hyperlipidemia: LDL of 49 in June 2023 with normal LFTs in January.  She remains on atorvastatin therapy.  5.  PSVT: Previously noted on event monitoring.  Quiescent.  Continue low-dose beta-blocker.  6.  COPD: No active wheezing.  Uses albuterol as prescribed.  7.  L4 compression fracture: This likely occurred in the setting of significant cough and community-acquired pneumonia earlier this year.  Being managed conservatively.  Able to walk without pain now.  8.  Disposition: Follow-up in 6 months or sooner if necessary.   Murray Hodgkins, NP 07/28/2022, 3:09 PM

## 2022-08-15 DIAGNOSIS — Z7901 Long term (current) use of anticoagulants: Secondary | ICD-10-CM | POA: Diagnosis not present

## 2022-08-15 DIAGNOSIS — R739 Hyperglycemia, unspecified: Secondary | ICD-10-CM | POA: Diagnosis not present

## 2022-08-15 DIAGNOSIS — C9110 Chronic lymphocytic leukemia of B-cell type not having achieved remission: Secondary | ICD-10-CM | POA: Diagnosis not present

## 2022-08-15 DIAGNOSIS — I480 Paroxysmal atrial fibrillation: Secondary | ICD-10-CM | POA: Diagnosis not present

## 2022-09-29 ENCOUNTER — Other Ambulatory Visit: Payer: Self-pay | Admitting: Internal Medicine

## 2022-10-24 ENCOUNTER — Other Ambulatory Visit (HOSPITAL_COMMUNITY): Payer: Self-pay | Admitting: Hematology

## 2022-10-24 DIAGNOSIS — Z1231 Encounter for screening mammogram for malignant neoplasm of breast: Secondary | ICD-10-CM

## 2022-11-02 ENCOUNTER — Inpatient Hospital Stay: Payer: Medicare HMO | Attending: Hematology

## 2022-11-02 DIAGNOSIS — Z87891 Personal history of nicotine dependence: Secondary | ICD-10-CM | POA: Insufficient documentation

## 2022-11-02 DIAGNOSIS — C9110 Chronic lymphocytic leukemia of B-cell type not having achieved remission: Secondary | ICD-10-CM

## 2022-11-02 DIAGNOSIS — Z853 Personal history of malignant neoplasm of breast: Secondary | ICD-10-CM | POA: Diagnosis not present

## 2022-11-02 DIAGNOSIS — E559 Vitamin D deficiency, unspecified: Secondary | ICD-10-CM

## 2022-11-02 DIAGNOSIS — Z9011 Acquired absence of right breast and nipple: Secondary | ICD-10-CM | POA: Diagnosis not present

## 2022-11-02 DIAGNOSIS — C50911 Malignant neoplasm of unspecified site of right female breast: Secondary | ICD-10-CM

## 2022-11-02 LAB — CBC WITH DIFFERENTIAL/PLATELET
Abs Immature Granulocytes: 0.05 10*3/uL (ref 0.00–0.07)
Basophils Absolute: 0.2 10*3/uL — ABNORMAL HIGH (ref 0.0–0.1)
Basophils Relative: 1 %
Eosinophils Absolute: 0.2 10*3/uL (ref 0.0–0.5)
Eosinophils Relative: 1 %
HCT: 38 % (ref 36.0–46.0)
Hemoglobin: 12.1 g/dL (ref 12.0–15.0)
Immature Granulocytes: 0 %
Lymphocytes Relative: 82 %
Lymphs Abs: 27.2 10*3/uL — ABNORMAL HIGH (ref 0.7–4.0)
MCH: 31 pg (ref 26.0–34.0)
MCHC: 31.8 g/dL (ref 30.0–36.0)
MCV: 97.4 fL (ref 80.0–100.0)
Monocytes Absolute: 0.9 10*3/uL (ref 0.1–1.0)
Monocytes Relative: 3 %
Neutro Abs: 4.2 10*3/uL (ref 1.7–7.7)
Neutrophils Relative %: 13 %
Platelets: 240 10*3/uL (ref 150–400)
RBC: 3.9 MIL/uL (ref 3.87–5.11)
RDW: 13.6 % (ref 11.5–15.5)
WBC: 32.7 10*3/uL — ABNORMAL HIGH (ref 4.0–10.5)
nRBC: 0 % (ref 0.0–0.2)

## 2022-11-02 LAB — COMPREHENSIVE METABOLIC PANEL
ALT: 12 U/L (ref 0–44)
AST: 20 U/L (ref 15–41)
Albumin: 3.9 g/dL (ref 3.5–5.0)
Alkaline Phosphatase: 44 U/L (ref 38–126)
Anion gap: 12 (ref 5–15)
BUN: 21 mg/dL (ref 8–23)
CO2: 21 mmol/L — ABNORMAL LOW (ref 22–32)
Calcium: 9 mg/dL (ref 8.9–10.3)
Chloride: 107 mmol/L (ref 98–111)
Creatinine, Ser: 0.98 mg/dL (ref 0.44–1.00)
GFR, Estimated: 57 mL/min — ABNORMAL LOW (ref >60)
Glucose, Bld: 140 mg/dL — ABNORMAL HIGH (ref 70–99)
Potassium: 3.8 mmol/L (ref 3.5–5.1)
Sodium: 140 mmol/L (ref 135–145)
Total Bilirubin: 0.9 mg/dL (ref 0.3–1.2)
Total Protein: 6.6 g/dL (ref 6.5–8.1)

## 2022-11-02 LAB — LACTATE DEHYDROGENASE: LDH: 141 U/L (ref 98–192)

## 2022-11-02 LAB — VITAMIN D 25 HYDROXY (VIT D DEFICIENCY, FRACTURES): Vit D, 25-Hydroxy: 22.44 ng/mL — ABNORMAL LOW (ref 30–100)

## 2022-11-07 DIAGNOSIS — Z7901 Long term (current) use of anticoagulants: Secondary | ICD-10-CM | POA: Diagnosis not present

## 2022-11-07 DIAGNOSIS — I480 Paroxysmal atrial fibrillation: Secondary | ICD-10-CM | POA: Diagnosis not present

## 2022-11-07 DIAGNOSIS — M25562 Pain in left knee: Secondary | ICD-10-CM | POA: Diagnosis not present

## 2022-11-07 DIAGNOSIS — C9110 Chronic lymphocytic leukemia of B-cell type not having achieved remission: Secondary | ICD-10-CM | POA: Diagnosis not present

## 2022-11-08 NOTE — Progress Notes (Signed)
[facility] [address] [place] [address]    Clinic Day:  11/09/2022  Referring physician: Gareth Morgan, MD  Patient Care Team: Gareth Morgan, MD as PCP - General (Family Medicine) Pricilla Riffle, MD as PCP - Cardiology (Cardiology)   ASSESSMENT & PLAN:   Assessment: 1.  CLL: -Patient was diagnosed 25+ years ago.  Has never required treatment. -Patient reports ongoing hot flashes and night sweats.  She denies any adenopathy.  No other B symptoms. -On 03/26/2019 patient had a CT scan of her chest which revealed a 17 mm irregular soft tissue density along with the medial aspect of the left lung base. -CT of chest on 04/23/2019 showed interval resolution of previously demonstrated small pleural effusions.  No suspicious pulmonary nodules.     2.  Stage III right breast cancer: -Patient was diagnosed in 1980 /1999. -Treated with neoadjuvant chemotherapy followed by right mastectomy then XRT.    Plan: 1. CLL: - Denies any fevers, night sweats or weight loss in the last 6 months. - Physical exam: No palpable adenopathy in the neck, axilla and no palpable splenomegaly. - Labs: WBC-32.7 and stable.  Hemoglobin and platelet count is normal.  LDH is normal.  LFTs are normal. - No indication for treatment at this time.  RTC 6 months for follow-up.   2.  Stage III right breast cancer: - Left breast mammogram on 06/16/2022: BI-RADS Category 1.   3.  Vitamin D deficiency: - She is taking 1000 units daily.  Vitamin D is 22.  She was told to increase vitamin D to 2000 units on Monday Wednesday and Friday and 1000 unit rest of the week.  Will repeat at next visit.      Orders Placed This Encounter  Procedures   CBC with Differential/Platelet    Standing Status:   Future    Standing Expiration Date:   11/09/2023    Order Specific Question:   Release to patient    Answer:   Immediate   Comprehensive metabolic panel    Standing Status:   Future    Standing  Expiration Date:   11/09/2023    Order Specific Question:   Release to patient    Answer:   Immediate   Lactate dehydrogenase    Standing Status:   Future    Standing Expiration Date:   11/09/2023    Order Specific Question:   Release to patient    Answer:   Immediate   VITAMIN D 25 Hydroxy (Vit-D Deficiency, Fractures)    Standing Status:   Future    Standing Expiration Date:   11/09/2023    Order Specific Question:   Release to patient    Answer:   Immediate      I,Katie Daubenspeck,acting as a scribe for Doreatha Massed, MD.,have documented all relevant documentation on the behalf of Doreatha Massed, MD,as directed by  Doreatha Massed, MD while in the presence of Doreatha Massed, MD.   I, Doreatha Massed MD, have reviewed the above documentation for accuracy and completeness, and I agree with the above.   Doreatha Massed, MD   6/19/20244:51 PM  CHIEF COMPLAINT:   Diagnosis: CLL    Cancer Staging  No matching staging information was found for the patient.   Prior Therapy: none  Current Therapy:  surveillance    HISTORY OF PRESENT ILLNESS:   Oncology History   No history exists.     INTERVAL HISTORY:   [name] is a 87  y.o. female presenting to clinic today for follow up of CLL. She was last seen by me on 05/03/22.  Since her last visit, she underwent annual screening mammogram on 06/16/22. Results were negative.  Today, she states that she is doing well overall. Her appetite level is at 100%. Her energy level is at 25%.  PAST MEDICAL HISTORY:   Past Medical History: Past Medical History:  Diagnosis Date   Bowel obstruction (HCC)    Breast cancer (HCC)    Breast cancer, stage 3 (HCC) 07/21/2011   Bronchitis    CAD (coronary artery disease)    a. 06/2020 Cath: LM nl, LAD 14m, LCX min irregs, RCA mild diff dzs. PA 34/15(21), PCWP 15. CO 5.0 L/min. EF 25-35%, glob HK.   Chronic heart failure with preserved ejection fraction (HFpEF) (HCC)     a. 06/2018 Echo: EF 55-60%; b. 03/2019 Echo: EF 40-45%; c. 05/2020 Echo: EF 35-40%; d. 12/2020 Echo: EF 35-40%; e. 03/2021 Echo: EF 35-40%. mod LVH,GrI DD, nl RV fxn, mod dil LA/RA, mild MR/AS.   CLL (chronic lymphocytic leukemia) (HCC) 07/21/2011   Migraines    NICM (nonischemic cardiomyopathy) (HCC)    PAF (paroxysmal atrial fibrillation) (HCC)    a. Dx 06/2018 in setting of COPD flare-->bb/eliquis (CHA2DS2VASc = 6); b. 02/2020 Zio: Predominantly sinus rhythm at an average heart rate of 60 (39-190).  3% atrial fibrillation burden and average of 127 (59-1 and 37), longest lasting 9 hours and 23 minutes.  43 runs of PSVT noted.  3 runs of nonsustained VT noted.   Pneumonia    PSVT (paroxysmal supraventricular tachycardia)    a. 02/2020 Zio: 43 rounds of symptomatic PSVT.  Longest 19 beats; fastest 164 bpm x 14 beats.    Surgical History: Past Surgical History:  Procedure Laterality Date   ABDOMINAL HYSTERECTOMY     APPENDECTOMY     CHOLECYSTECTOMY     LAPAROTOMY N/A 06/08/2015   Procedure: EXPLORATORY LAPAROTOMY;  Surgeon: Franky Macho, MD;  Location: [facility];  Service: General;  Laterality: N/A;   LYSIS OF ADHESION N/A 06/08/2015   Procedure: LYSIS OF ADHESION;  Surgeon: Franky Macho, MD;  Location: [facility];  Service: General;  Laterality: N/A;   MASTECTOMY     right   RIGHT/LEFT HEART CATH AND CORONARY ANGIOGRAPHY N/A 07/09/2020   Procedure: RIGHT/LEFT HEART CATH AND CORONARY ANGIOGRAPHY;  Surgeon: Yvonne Kendall, MD;  Location: [facility];  Service: Cardiovascular;  Laterality: N/A;   TOTAL HIP ARTHROPLASTY     right    Social History: Social History   Socioeconomic History   Marital status: Widowed    Spouse name: Not on file   Number of children: Not on file   Years of education: 11   Highest education level: Not on file  Occupational History   Not on file  Tobacco Use   Smoking status: Former    Types: Cigarettes    Quit date: 10/26/1978    Years since quitting:  44.0   Smokeless tobacco: Never  Vaping Use   Vaping Use: Never used  Substance and Sexual Activity   Alcohol use: No   Drug use: No   Sexual activity: Not Currently  Other Topics Concern   Not on file  Social History Narrative   Pt lives by herself   Social Determinants of Health   Financial Resource Strain: Low Risk  (04/09/2020)   Overall Financial Resource Strain (CARDIA)    Difficulty of Paying Living Expenses: Not hard at  all  Food Insecurity: No Food Insecurity (05/23/2022)   Hunger Vital Sign    Worried About Running Out of Food in the Last Year: Never true    Ran Out of Food in the Last Year: Never true  Transportation Needs: No Transportation Needs (05/23/2022)   PRAPARE - Administrator, Civil Service (Medical): No    Lack of Transportation (Non-Medical): No  Physical Activity: Inactive (04/09/2020)   Exercise Vital Sign    Days of Exercise per Week: 0 days    Minutes of Exercise per Session: 0 min  Stress: Stress Concern Present (06/27/2018)   [employer] of Occupational Health - Occupational Stress Questionnaire    Feeling of Stress : Rather much  Social Connections: Socially Isolated (04/09/2020)   Social Connection and Isolation Panel [NHANES]    Frequency of Communication with Friends and Family: More than three times a week    Frequency of Social Gatherings with Friends and Family: Twice a week    Attends Religious Services: Never    Database administrator or Organizations: No    Attends Banker Meetings: Never    Marital Status: Widowed  Intimate Partner Violence: Not At Risk (05/23/2022)   Humiliation, Afraid, Rape, and Kick questionnaire    Fear of Current or Ex-Partner: No    Emotionally Abused: No    Physically Abused: No    Sexually Abused: No    Family History: Family History  Problem Relation Age of Onset   Cancer Brother    Cancer Other     Current Medications:  Current Outpatient Medications:    acetaminophen  (TYLENOL) 500 MG tablet, Take 1,000 mg by mouth every 6 (six) hours as needed for moderate pain or headache., Disp: , Rfl:    albuterol (PROVENTIL HFA;VENTOLIN HFA) 108 (90 BASE) MCG/ACT inhaler, Inhale 2 puffs into the lungs every 6 (six) hours as needed for shortness of breath., Disp: , Rfl:    albuterol (PROVENTIL) (2.5 MG/3ML) 0.083% nebulizer solution, Take 3 mLs (2.5 mg total) by nebulization every 6 (six) hours as needed for wheezing or shortness of breath (when not relieved by inhaler.)., Disp: 75 mL, Rfl: 12   apixaban (ELIQUIS) 5 MG TABS tablet, Take 1 tablet (5 mg total) by mouth 2 (two) times daily., Disp: 180 tablet, Rfl: 1   atorvastatin (LIPITOR) 20 MG tablet, Take 1 tablet (20 mg total) by mouth daily., Disp: 90 tablet, Rfl: 3   bisoprolol (ZEBETA) 5 MG tablet, TAKE (1/2) TABLET BY MOUTH ONCE DAILY., Disp: 30 tablet, Rfl: 3   budesonide-formoterol (SYMBICORT) 160-4.5 MCG/ACT inhaler, Inhale 2 puffs into the lungs 2 (two) times daily., Disp: 1 each, Rfl: 12   cholecalciferol (VITAMIN D3) 25 MCG (1000 UNIT) tablet, Take 1 capsule by mouth daily., Disp: , Rfl:    diphenhydrAMINE (BENADRYL) 25 MG tablet, Take 25 mg by mouth daily as needed for allergies., Disp: , Rfl:    losartan (COZAAR) 25 MG tablet, TAKE (1/2) TABLET BY MOUTH DAILY, Disp: 45 tablet, Rfl: 3   omeprazole (PRILOSEC) 20 MG capsule, Take 20 mg by mouth daily., Disp: , Rfl:    ondansetron (ZOFRAN) 4 MG tablet, Take 1 tablet (4 mg total) by mouth every 8 (eight) hours as needed for nausea or vomiting., Disp: 20 tablet, Rfl: 0   spironolactone (ALDACTONE) 25 MG tablet, TAKE (1) TABLET BY MOUTH DAILY., Disp: 90 tablet, Rfl: 3   VEOZAH 45 MG TABS, Take 1 tablet by mouth daily., Disp: ,  Rfl:    Misc. Devices MISC, Please provide patient with breast prosthesis and mastectomy bra. Dx: C50.911, Disp: 1 each, Rfl: 0   Allergies: Allergies  Allergen Reactions   Codeine Nausea And Vomiting    headache   Meclizine Other (See  Comments)    Made dizziness worse.   Sulfa Antibiotics Nausea And Vomiting    Stomach upset   Entresto [Sacubitril-Valsartan]    Jardiance [Empagliflozin]    Zoloft [Sertraline Hcl] Anxiety    REVIEW OF SYSTEMS:   Review of Systems  Constitutional:  Negative for chills, fatigue and fever.  HENT:   Negative for lump/mass, mouth sores, nosebleeds, sore throat and trouble swallowing.   Eyes:  Negative for eye problems.  Respiratory:  Positive for shortness of breath. Negative for cough.   Cardiovascular:  Negative for chest pain, leg swelling and palpitations.  Gastrointestinal:  Positive for diarrhea and nausea. Negative for abdominal pain, constipation and vomiting.  Genitourinary:  Negative for bladder incontinence, difficulty urinating, dysuria, frequency, hematuria and nocturia.   Musculoskeletal:  Negative for arthralgias, back pain, flank pain, myalgias and neck pain.  Skin:  Negative for itching and rash.  Neurological:  Positive for dizziness. Negative for headaches and numbness.  Hematological:  Does not bruise/bleed easily.  Psychiatric/Behavioral:  Negative for depression, sleep disturbance and suicidal ideas. The patient is not nervous/anxious.   All other systems reviewed and are negative.    VITALS:   Blood pressure 102/83, pulse 60, temperature 98.5 F (36.9 C), temperature source Oral, resp. rate 18, height 5\' 1"  (1.549 m), weight 159 lb 9.6 oz (72.4 kg), SpO2 96 %.  Wt Readings from Last 3 Encounters:  11/09/22 159 lb 9.6 oz (72.4 kg)  07/28/22 153 lb (69.4 kg)  05/24/22 162 lb 3.2 oz (73.6 kg)    Body mass index is 30.16 kg/m.  Performance status (ECOG): 1 - Symptomatic but completely ambulatory  PHYSICAL EXAM:   Physical Exam Vitals and nursing note reviewed. Exam conducted with a chaperone present.  Constitutional:      Appearance: Normal appearance.  Cardiovascular:     Rate and Rhythm: Normal rate and regular rhythm.     Pulses: Normal pulses.      Heart sounds: Normal heart sounds.  Pulmonary:     Effort: Pulmonary effort is normal.     Breath sounds: Normal breath sounds.  Abdominal:     Palpations: Abdomen is soft. There is no hepatomegaly, splenomegaly or mass.     Tenderness: There is no abdominal tenderness.  Musculoskeletal:     Right lower leg: No edema.     Left lower leg: No edema.  Lymphadenopathy:     Cervical: No cervical adenopathy.     Right cervical: No superficial, deep or posterior cervical adenopathy.    Left cervical: No superficial, deep or posterior cervical adenopathy.     Upper Body:     Right upper body: No supraclavicular or axillary adenopathy.     Left upper body: No supraclavicular or axillary adenopathy.  Neurological:     General: No focal deficit present.     Mental Status: She is alert and oriented to person, place, and time.  Psychiatric:        Mood and Affect: Mood normal.        Behavior: Behavior normal.     LABS:      Latest Ref Rng & Units 11/02/2022    1:16 PM 05/24/2022    4:44 AM 05/23/2022  5:31 PM  CBC  WBC 4.0 - 10.5 K/uL 32.7  26.5  27.1   Hemoglobin 12.0 - 15.0 g/dL 16.1  09.6  04.5   Hematocrit 36.0 - 46.0 % 38.0  35.0  38.3   Platelets 150 - 400 K/uL 240  179  208       Latest Ref Rng & Units 11/02/2022    1:16 PM 05/24/2022    4:44 AM 05/23/2022    5:31 PM  CMP  Glucose 70 - 99 mg/dL 409  811  914   BUN 8 - 23 mg/dL 21  24  25    Creatinine 0.44 - 1.00 mg/dL 7.82  9.56  2.13   Sodium 135 - 145 mmol/L 140  140  140   Potassium 3.5 - 5.1 mmol/L 3.8  4.5  4.5   Chloride 98 - 111 mmol/L 107  111  108   CO2 22 - 32 mmol/L 21  20  24    Calcium 8.9 - 10.3 mg/dL 9.0  8.4  8.9   Total Protein 6.5 - 8.1 g/dL 6.6  6.2  6.9   Total Bilirubin 0.3 - 1.2 mg/dL 0.9  0.5  0.7   Alkaline Phos 38 - 126 U/L 44  47  50   AST 15 - 41 U/L 20  21  19    ALT 0 - 44 U/L 12  11  11       Lab Results  Component Value Date   CEA 0.8 06/28/2013   /  CEA  Date Value Ref Range Status   06/28/2013 0.8 0.0 - 5.0 ng/mL Final    Comment:    Performed at [facility]   No results found for: "PSA1" No results found for: "CAN199" No results found for: "CAN125"  No results found for: "TOTALPROTELP", "ALBUMINELP", "A1GS", "A2GS", "BETS", "BETA2SER", "GAMS", "MSPIKE", "SPEI" No results found for: "TIBC", "FERRITIN", "IRONPCTSAT" Lab Results  Component Value Date   LDH 141 11/02/2022   LDH 130 04/26/2022   LDH 125 10/25/2021     STUDIES:   No results found.

## 2022-11-09 ENCOUNTER — Inpatient Hospital Stay: Payer: Medicare HMO | Admitting: Hematology

## 2022-11-09 DIAGNOSIS — C9110 Chronic lymphocytic leukemia of B-cell type not having achieved remission: Secondary | ICD-10-CM

## 2022-11-09 DIAGNOSIS — Z9011 Acquired absence of right breast and nipple: Secondary | ICD-10-CM | POA: Diagnosis not present

## 2022-11-09 DIAGNOSIS — C50911 Malignant neoplasm of unspecified site of right female breast: Secondary | ICD-10-CM | POA: Diagnosis not present

## 2022-11-09 DIAGNOSIS — E559 Vitamin D deficiency, unspecified: Secondary | ICD-10-CM | POA: Diagnosis not present

## 2022-11-09 DIAGNOSIS — Z87891 Personal history of nicotine dependence: Secondary | ICD-10-CM | POA: Diagnosis not present

## 2022-11-09 DIAGNOSIS — Z853 Personal history of malignant neoplasm of breast: Secondary | ICD-10-CM | POA: Diagnosis not present

## 2022-11-09 MED ORDER — MISC. DEVICES MISC: 0 refills | Status: DC

## 2022-11-09 NOTE — Patient Instructions (Signed)
[facility]  Discharge Instructions  You were seen and examined today by Dr. Ellin Saba.  Dr. Ellin Saba discussed your most recent lab work and CT scan which revealed that everything looks good except your Vitamin D is low.  Increase Vitamin D to two tablets on Monday, Wednesday and Friday and one tablet on the other days.  Follow-up as scheduled in 6 months.    Thank you for choosing [facility] to provide your oncology and hematology care.   To afford each patient quality time with our provider, please arrive at least 15 minutes before your scheduled appointment time. You may need to reschedule your appointment if you arrive late (10 or more minutes). Arriving late affects you and other patients whose appointments are after yours.  Also, if you miss three or more appointments without notifying the office, you may be dismissed from the clinic at the provider's discretion.    Again, thank you for choosing [facility].  Our hope is that these requests will decrease the amount of time that you wait before being seen by our physicians.   If you have a lab appointment with the Cancer Center - please note that after April 8th, all labs will be drawn in the cancer center.  You do not have to check in or register with the main entrance as you have in the past but will complete your check-in at the cancer center.            _____________________________________________________________  Should you have questions after your visit to [facility], please contact our office at [phone] and follow the prompts.  Our office hours are 8:00 a.m. to 4:30 p.m. Monday - Thursday and 8:00 a.m. to 2:30 p.m. Friday.  Please note that voicemails left after 4:00 p.m. may not be returned until the following business day.  We are closed weekends and all major holidays.  You do have access to a nurse 24-7, just call the main number to  the clinic [phone] and do not press any options, hold on the line and a nurse will answer the phone.    For prescription refill requests, have your pharmacy contact our office and allow 72 hours.    Masks are no longer required in the cancer centers. If you would like for your care team to wear a mask while they are taking care of you, please let them know. You may have one support person who is at least 85 years old accompany you for your appointments.

## 2022-12-02 DIAGNOSIS — M1712 Unilateral primary osteoarthritis, left knee: Secondary | ICD-10-CM | POA: Diagnosis not present

## 2022-12-28 ENCOUNTER — Ambulatory Visit (INDEPENDENT_AMBULATORY_CARE_PROVIDER_SITE_OTHER): Payer: Medicare HMO | Admitting: Internal Medicine

## 2022-12-28 ENCOUNTER — Encounter: Payer: Self-pay | Admitting: Internal Medicine

## 2022-12-28 VITALS — BP 110/59 | HR 57 | Ht 61.0 in | Wt 156.8 lb

## 2022-12-28 DIAGNOSIS — S32040D Wedge compression fracture of fourth lumbar vertebra, subsequent encounter for fracture with routine healing: Secondary | ICD-10-CM | POA: Diagnosis not present

## 2022-12-28 DIAGNOSIS — C9110 Chronic lymphocytic leukemia of B-cell type not having achieved remission: Secondary | ICD-10-CM | POA: Diagnosis not present

## 2022-12-28 DIAGNOSIS — C50911 Malignant neoplasm of unspecified site of right female breast: Secondary | ICD-10-CM | POA: Diagnosis not present

## 2022-12-28 DIAGNOSIS — J4489 Other specified chronic obstructive pulmonary disease: Secondary | ICD-10-CM

## 2022-12-28 DIAGNOSIS — S32040S Wedge compression fracture of fourth lumbar vertebra, sequela: Secondary | ICD-10-CM

## 2022-12-28 DIAGNOSIS — R232 Flushing: Secondary | ICD-10-CM | POA: Diagnosis not present

## 2022-12-28 DIAGNOSIS — I5042 Chronic combined systolic (congestive) and diastolic (congestive) heart failure: Secondary | ICD-10-CM | POA: Diagnosis not present

## 2022-12-28 DIAGNOSIS — I480 Paroxysmal atrial fibrillation: Secondary | ICD-10-CM | POA: Diagnosis not present

## 2022-12-28 DIAGNOSIS — I2510 Atherosclerotic heart disease of native coronary artery without angina pectoris: Secondary | ICD-10-CM

## 2022-12-28 DIAGNOSIS — E782 Mixed hyperlipidemia: Secondary | ICD-10-CM

## 2022-12-28 DIAGNOSIS — M1712 Unilateral primary osteoarthritis, left knee: Secondary | ICD-10-CM

## 2022-12-28 DIAGNOSIS — E559 Vitamin D deficiency, unspecified: Secondary | ICD-10-CM

## 2022-12-28 DIAGNOSIS — K219 Gastro-esophageal reflux disease without esophagitis: Secondary | ICD-10-CM | POA: Diagnosis not present

## 2022-12-28 NOTE — Progress Notes (Signed)
New Patient Office Visit  Subjective    Patient ID: CIERRA JOHAL, female    DOB: [birth year]  Age: 87 y.o. MRN: [id number]  CC:  Chief Complaint  Patient presents with   Establish Care   HPI Brittanni Rulison [facility] presents to establish care.  She is an 87 year old woman with a past medical history significant for COPD, paroxysmal A-fib, CHF, CAD, CLL, history of breast cancer s/p mastectomy (1999), left knee osteoarthritis, GERD, vitamin D deficiency, and HLD.  Previously followed by Dr. Sudie Bailey.  She is also followed by cardiology and oncology.  Ms. Pollnow endorses chronic nausea but is otherwise feeling well today.  She has no acute concerns to discuss aside from desiring to establish care.  She previously worked as a Event organiser for 26 years.  She endorses a remote history of tobacco use (1980s) and denies alcohol and illicit drug use.  Her family medical history is significant for CAD.  Chronic medical conditions and outlined preventative care as discussed today are individually addressed/below.  Outpatient Encounter Medications as of 12/28/2022  Medication Sig   acetaminophen (TYLENOL) 500 MG tablet Take 1,000 mg by mouth every 6 (six) hours as needed for moderate pain or headache.   albuterol (PROVENTIL HFA;VENTOLIN HFA) 108 (90 BASE) MCG/ACT inhaler Inhale 2 puffs into the lungs every 6 (six) hours as needed for shortness of breath.   albuterol (PROVENTIL) (2.5 MG/3ML) 0.083% nebulizer solution Take 3 mLs (2.5 mg total) by nebulization every 6 (six) hours as needed for wheezing or shortness of breath (when not relieved by inhaler.).   apixaban (ELIQUIS) 5 MG TABS tablet Take 1 tablet (5 mg total) by mouth 2 (two) times daily.   atorvastatin (LIPITOR) 20 MG tablet Take 1 tablet (20 mg total) by mouth daily.   bisoprolol (ZEBETA) 5 MG tablet TAKE (1/2) TABLET BY MOUTH ONCE DAILY.   cholecalciferol (VITAMIN D3) 25 MCG (1000 UNIT) tablet Take 1 capsule by mouth daily.    diphenhydrAMINE (BENADRYL) 25 MG tablet Take 25 mg by mouth daily as needed for allergies.   losartan (COZAAR) 25 MG tablet TAKE (1/2) TABLET BY MOUTH DAILY   Misc. 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 Dx: C50.911   omeprazole (PRILOSEC) 20 MG capsule Take 20 mg by mouth daily.   ondansetron (ZOFRAN) 4 MG tablet Take 1 tablet (4 mg total) by mouth every 8 (eight) hours as needed for nausea or vomiting.   spironolactone (ALDACTONE) 25 MG tablet TAKE (1) TABLET BY MOUTH DAILY.   VEOZAH 45 MG TABS Take 1 tablet by mouth daily.   [DISCONTINUED] budesonide-formoterol (SYMBICORT) 160-4.5 MCG/ACT inhaler Inhale 2 puffs into the lungs 2 (two) times daily.   No facility-administered encounter medications on file as of 12/28/2022.    Past Medical History:  Diagnosis Date   Bowel obstruction (HCC)    Breast cancer (HCC)    Breast cancer, stage 3 (HCC) 07/21/2011   Bronchitis    CAD (coronary artery disease)    a. 06/2020 Cath: LM nl, LAD 76m, LCX min irregs, RCA mild diff dzs. PA 34/15(21), PCWP 15. CO 5.0 L/min. EF 25-35%, glob HK.   Chronic heart failure with preserved ejection fraction (HFpEF) (HCC)    a. 06/2018 Echo: EF 55-60%; b. 03/2019 Echo: EF 40-45%; c. 05/2020 Echo: EF 35-40%; d. 12/2020 Echo: EF 35-40%; e. 03/2021 Echo: EF 35-40%. mod LVH,GrI DD, nl RV fxn, mod dil LA/RA, mild MR/AS.   CLL (chronic lymphocytic  leukemia) (HCC) 07/21/2011   Migraines    NICM (nonischemic cardiomyopathy) (HCC)    PAF (paroxysmal atrial fibrillation) (HCC)    a. Dx 06/2018 in setting of COPD flare-->bb/eliquis (CHA2DS2VASc = 6); b. 02/2020 Zio: Predominantly sinus rhythm at an average heart rate of 60 (39-190).  3% atrial fibrillation burden and average of 127 (59-1 and 37), longest lasting 9 hours and 23 minutes.  43 runs of PSVT noted.  3 runs of nonsustained VT noted.   Pneumonia    PSVT (paroxysmal supraventricular tachycardia)    a. 02/2020 Zio: 43 rounds of  symptomatic PSVT.  Longest 19 beats; fastest 164 bpm x 14 beats.    Past Surgical History:  Procedure Laterality Date   ABDOMINAL HYSTERECTOMY     APPENDECTOMY     CATARACT EXTRACTION Bilateral 2023   CHOLECYSTECTOMY     LAPAROTOMY N/A 06/08/2015   Procedure: EXPLORATORY LAPAROTOMY;  Surgeon: Franky Macho, MD;  Location: [facility];  Service: General;  Laterality: N/A;   LYSIS OF ADHESION N/A 06/08/2015   Procedure: LYSIS OF ADHESION;  Surgeon: Franky Macho, MD;  Location: [facility];  Service: General;  Laterality: N/A;   MASTECTOMY     right   RIGHT/LEFT HEART CATH AND CORONARY ANGIOGRAPHY N/A 07/09/2020   Procedure: RIGHT/LEFT HEART CATH AND CORONARY ANGIOGRAPHY;  Surgeon: Yvonne Kendall, MD;  Location: [facility];  Service: Cardiovascular;  Laterality: N/A;   TOTAL HIP ARTHROPLASTY     right    Family History  Problem Relation Age of Onset   Cancer Brother    Cancer Other     Social History   Socioeconomic History   Marital status: Widowed    Spouse name: Not on file   Number of children: Not on file   Years of education: 11   Highest education level: Not on file  Occupational History   Not on file  Tobacco Use   Smoking status: Former    Current packs/day: 0.00    Types: Cigarettes    Quit date: 10/26/1978    Years since quitting: 44.2   Smokeless tobacco: Never  Vaping Use   Vaping status: Never Used  Substance and Sexual Activity   Alcohol use: No   Drug use: No   Sexual activity: Not Currently  Other Topics Concern   Not on file  Social History Narrative   Pt lives by herself   Social Determinants of Health   Financial Resource Strain: Low Risk  (04/09/2020)   Overall Financial Resource Strain (CARDIA)    Difficulty of Paying Living Expenses: Not hard at all  Food Insecurity: No Food Insecurity (05/23/2022)   Hunger Vital Sign    Worried About Running Out of Food in the Last Year: Never true    Ran Out of Food in the Last Year: Never true   Transportation Needs: No Transportation Needs (05/23/2022)   PRAPARE - Administrator, Civil Service (Medical): No    Lack of Transportation (Non-Medical): No  Physical Activity: Inactive (04/09/2020)   Exercise Vital Sign    Days of Exercise per Week: 0 days    Minutes of Exercise per Session: 0 min  Stress: Stress Concern Present (06/27/2018)   [employer] of Occupational Health - Occupational Stress Questionnaire    Feeling of Stress : Rather much  Social Connections: Socially Isolated (04/09/2020)   Social Connection and Isolation Panel [NHANES]    Frequency of Communication with Friends and Family: More than three times a week  Frequency of Social Gatherings with Friends and Family: Twice a week    Attends Religious Services: Never    Database administrator or Organizations: No    Attends Banker Meetings: Never    Marital Status: Widowed  Intimate Partner Violence: Not At Risk (05/23/2022)   Humiliation, Afraid, Rape, and Kick questionnaire    Fear of Current or Ex-Partner: No    Emotionally Abused: No    Physically Abused: No    Sexually Abused: No    Review of Systems  Constitutional:  Negative for chills and fever.  HENT:  Negative for sore throat.   Respiratory:  Negative for cough and shortness of breath.   Cardiovascular:  Negative for chest pain, palpitations and leg swelling.  Gastrointestinal:  Positive for nausea (Chronic). Negative for abdominal pain, blood in stool, constipation, diarrhea and vomiting.  Genitourinary:  Negative for dysuria and hematuria.  Musculoskeletal:  Negative for myalgias.  Skin:  Negative for itching and rash.  Neurological:  Negative for dizziness and headaches.  Psychiatric/Behavioral:  Negative for depression and suicidal ideas.    Objective    BP (!) 110/59   Pulse (!) 57   Ht 5\' 1"  (1.549 m)   Wt 156 lb 12.8 oz (71.1 kg)   SpO2 95%   BMI 29.63 kg/m   Physical Exam Vitals reviewed.   Constitutional:      General: She is not in acute distress.    Appearance: Normal appearance. She is not toxic-appearing.  HENT:     Head: Normocephalic and atraumatic.     Right Ear: External ear normal.     Left Ear: External ear normal.     Nose: Nose normal. No congestion or rhinorrhea.     Mouth/Throat:     Mouth: Mucous membranes are moist.     Pharynx: Oropharynx is clear. No oropharyngeal exudate or posterior oropharyngeal erythema.  Eyes:     General: No scleral icterus.    Extraocular Movements: Extraocular movements intact.     Conjunctiva/sclera: Conjunctivae normal.     Pupils: Pupils are equal, round, and reactive to light.  Cardiovascular:     Rate and Rhythm: Normal rate and regular rhythm.     Pulses: Normal pulses.     Heart sounds: Normal heart sounds. No murmur heard.    No friction rub. No gallop.  Pulmonary:     Effort: Pulmonary effort is normal.     Breath sounds: Normal breath sounds. No wheezing, rhonchi or rales.  Abdominal:     General: Abdomen is flat. Bowel sounds are normal. There is no distension.     Palpations: Abdomen is soft.     Tenderness: There is no abdominal tenderness.  Musculoskeletal:        General: No swelling. Normal range of motion.     Cervical back: Normal range of motion.     Right lower leg: No edema.     Left lower leg: No edema.  Lymphadenopathy:     Cervical: No cervical adenopathy.  Skin:    General: Skin is warm and dry.     Capillary Refill: Capillary refill takes less than 2 seconds.     Coloration: Skin is not jaundiced.  Neurological:     General: No focal deficit present.     Mental Status: She is alert and oriented to person, place, and time.     Gait: Gait abnormal (Ambulates with cane).  Psychiatric:        Mood  and Affect: Mood normal.        Behavior: Behavior normal.   Last CBC Lab Results  Component Value Date   WBC 32.7 (H) 11/02/2022   HGB 12.1 11/02/2022   HCT 38.0 11/02/2022   MCV 97.4  11/02/2022   MCH 31.0 11/02/2022   RDW 13.6 11/02/2022   PLT 240 11/02/2022   Last metabolic panel Lab Results  Component Value Date   GLUCOSE 140 (H) 11/02/2022   NA 140 11/02/2022   K 3.8 11/02/2022   CL 107 11/02/2022   CO2 21 (L) 11/02/2022   BUN 21 11/02/2022   CREATININE 0.98 11/02/2022   GFRNONAA 57 (L) 11/02/2022   CALCIUM 9.0 11/02/2022   PHOS 3.4 05/24/2022   PROT 6.6 11/02/2022   ALBUMIN 3.9 11/02/2022   BILITOT 0.9 11/02/2022   ALKPHOS 44 11/02/2022   AST 20 11/02/2022   ALT 12 11/02/2022   ANIONGAP 12 11/02/2022   Last lipids Lab Results  Component Value Date   CHOL 107 11/15/2021   HDL 43 11/15/2021   LDLCALC 49 11/15/2021   TRIG 75 11/15/2021   CHOLHDL 2.5 11/15/2021   Last hemoglobin A1c Lab Results  Component Value Date   HGBA1C 5.7 (H) 11/15/2021   Last thyroid functions Lab Results  Component Value Date   TSH 2.056 11/15/2021   Last vitamin D Lab Results  Component Value Date   VD25OH 22.44 (L) 11/02/2022   Last vitamin B12 and Folate Lab Results  Component Value Date   VITAMINB12 281 10/07/2020   FOLATE 11.6 10/07/2020   Assessment & Plan:   Problem List Items Addressed This Visit       Paroxysmal atrial fibrillation (HCC) - Primary    Regular rate and rhythm detected on exam today.  Followed by cardiology.  She is currently prescribed Eliquis 5 mg twice daily and bisoprolol 2.5 mg daily. -No medication changes are indicated today -Cardiology follow-up as scheduled for next month.      Chronic combined systolic and diastolic CHF (congestive heart failure) (HCC)    Euvolemic on exam today.  She is currently prescribed bisoprolol, losartan, and spironolactone.  No changes are indicated today.      CAD (coronary artery disease)    Mild, nonobstructive CAD.  Currently prescribed atorvastatin and Eliquis.  Denies recent chest pain.      Hot flashes    Symptoms are controlled with Veozah.      COPD with chronic bronchitis     History of COPD.  Asymptomatic currently.  Pulmonary exam is unremarkable.  Uses albuterol inhaler/nebulizer for rescue therapy.  Reports using 1-2 times per week.      GERD without esophagitis    Symptoms are adequately controlled with omeprazole.       Unilateral primary osteoarthritis, left knee    Followed by orthopedic surgery ( Dr. Lequita Halt). Receiving gel injections.       Closed compression fracture of L4 vertebra (HCC)    Earlier this year.  Pain is adequately controlled with as needed use of Tylenol.      Mixed hyperlipidemia    She is currently prescribed atorvastatin 20 mg daily.  Lipid panel recently updated and at goal per patient.  No changes are indicated today.      CLL (chronic lymphocytic leukemia) (HCC)    History of CLL.  Diagnosed 1990.  Followed by oncology (Dr. Ellin Saba).  Last seen for follow-up in June.  Under surveillance.  No indication for treatment at this time.  Breast cancer, stage 3 (HCC)    History of breast cancer s/p right mastectomy (1999) and radiation/chemotherapy.  Followed by oncology.  She will undergo repeat mammogram in January.      Vitamin D deficiency    She is on daily vitamin D supplementation.  Reports that her vitamin D level was adequate when recently checked.       Return in about 3 months (around 03/30/2023).   Billie Lade, MD

## 2022-12-28 NOTE — Patient Instructions (Signed)
It was a pleasure to see you today.  Thank you for giving [place] the opportunity to be involved in your care.  Below is a brief recap of your visit and next steps.  We will plan to see you again in 3 months.  Summary You have established care today No medication changes have been We will plan for follow up in 3 months    Schedule your Medicare Annual Wellness Visit at checkout.

## 2022-12-28 NOTE — Assessment & Plan Note (Signed)
Regular rate and rhythm detected on exam today.  Followed by cardiology.  She is currently prescribed Eliquis 5 mg twice daily and bisoprolol 2.5 mg daily. -No medication changes are indicated today -Cardiology follow-up as scheduled for next month.

## 2023-01-09 DIAGNOSIS — R232 Flushing: Secondary | ICD-10-CM | POA: Insufficient documentation

## 2023-01-09 DIAGNOSIS — E559 Vitamin D deficiency, unspecified: Secondary | ICD-10-CM | POA: Insufficient documentation

## 2023-01-09 DIAGNOSIS — S32040A Wedge compression fracture of fourth lumbar vertebra, initial encounter for closed fracture: Secondary | ICD-10-CM | POA: Insufficient documentation

## 2023-01-09 NOTE — Assessment & Plan Note (Signed)
History of breast cancer s/p right mastectomy (1999) and radiation/chemotherapy.  Followed by oncology.  She will undergo repeat mammogram in January.

## 2023-01-09 NOTE — Assessment & Plan Note (Signed)
Symptoms are controlled with Veozah.

## 2023-01-09 NOTE — Assessment & Plan Note (Signed)
Symptoms are adequately controlled with omeprazole.

## 2023-01-09 NOTE — Assessment & Plan Note (Signed)
Earlier this year.  Pain is adequately controlled with as needed use of Tylenol.

## 2023-01-09 NOTE — Assessment & Plan Note (Signed)
History of CLL.  Diagnosed 1990.  Followed by oncology (Dr. Ellin Saba).  Last seen for follow-up in June.  Under surveillance.  No indication for treatment at this time.

## 2023-01-09 NOTE — Assessment & Plan Note (Signed)
She is currently prescribed atorvastatin 20 mg daily.  Lipid panel recently updated and at goal per patient.  No changes are indicated today.

## 2023-01-09 NOTE — Assessment & Plan Note (Signed)
Followed by orthopedic surgery ( Dr. Lequita Halt). Receiving gel injections.

## 2023-01-09 NOTE — Assessment & Plan Note (Signed)
Euvolemic on exam today.  She is currently prescribed bisoprolol, losartan, and spironolactone.  No changes are indicated today.

## 2023-01-09 NOTE — Assessment & Plan Note (Signed)
Mild, nonobstructive CAD.  Currently prescribed atorvastatin and Eliquis.  Denies recent chest pain.

## 2023-01-09 NOTE — Assessment & Plan Note (Addendum)
She is on daily vitamin D supplementation.  Reports that her vitamin D level was adequate when recently checked.

## 2023-01-09 NOTE — Assessment & Plan Note (Signed)
History of COPD.  Asymptomatic currently.  Pulmonary exam is unremarkable.  Uses albuterol inhaler/nebulizer for rescue therapy.  Reports using 1-2 times per week.

## 2023-01-16 ENCOUNTER — Other Ambulatory Visit: Payer: Self-pay | Admitting: Internal Medicine

## 2023-01-16 DIAGNOSIS — I480 Paroxysmal atrial fibrillation: Secondary | ICD-10-CM

## 2023-01-16 NOTE — Telephone Encounter (Signed)
Prescription refill request for Eliquis received. Indication: Afib  Last office visit: 07/28/22 [name])  Scr: 0.98 (11/02/22)  Age: 87 Weight: 71.1kg  Appropriate dose. Refill sent.

## 2023-01-19 DIAGNOSIS — M1712 Unilateral primary osteoarthritis, left knee: Secondary | ICD-10-CM | POA: Diagnosis not present

## 2023-01-26 DIAGNOSIS — M1712 Unilateral primary osteoarthritis, left knee: Secondary | ICD-10-CM | POA: Diagnosis not present

## 2023-01-28 ENCOUNTER — Other Ambulatory Visit: Payer: Self-pay | Admitting: Internal Medicine

## 2023-01-31 ENCOUNTER — Telehealth: Payer: Self-pay | Admitting: Internal Medicine

## 2023-01-31 NOTE — Telephone Encounter (Signed)
[employer] called asked for Dr Christel Mormon the provider reach out to providers services at [phone] needs to update his credentials to [facility] for this patient insurance.

## 2023-02-01 ENCOUNTER — Telehealth: Payer: Self-pay | Admitting: Internal Medicine

## 2023-02-01 NOTE — Telephone Encounter (Signed)
error 

## 2023-02-01 NOTE — Telephone Encounter (Signed)
Spoke with [employer] - Representative states that Dr. Durwin Nora is not affiliated with [employer]'s Tax ID #. Will escalate concern to [facility] credentialing department.

## 2023-02-02 DIAGNOSIS — M1712 Unilateral primary osteoarthritis, left knee: Secondary | ICD-10-CM | POA: Diagnosis not present

## 2023-02-05 NOTE — Progress Notes (Unsigned)
atrial pressure.   2. Right ventricular systolic function is normal. The right ventricular  size is normal. There is mildly elevated pulmonary artery systolic   pressure.   3. Left atrial size was moderately dilated.   4. Right atrial size was mild to moderately dilated.   5. The mitral valve is abnormal. Mild mitral valve regurgitation. Mild  mitral stenosis. Moderate mitral annular calcification.   6. The aortic valve is tricuspid. There is moderate calcification of the  aortic valve. There is moderate thickening of the aortic valve. Aortic  valve regurgitation is not visualized. Mild aortic valve stenosis.   FINDINGS   Left Ventricle: The anteroseptum, anterior    1. Left ventricular ejection fraction, by estimation, is 35 to 40%. The  left ventricle has moderately decreased function. The left ventricle  demonstrates regional wall motion abnormalities (see scoring  diagram/findings for description). The left  ventricular internal cavity size was mildly dilated. Left ventricular  diastolic parameters are consistent with Grade I diastolic dysfunction  (impaired relaxation).   2. Right ventricular systolic function is normal. The right ventricular  size is normal. Tricuspid regurgitation signal is inadequate for assessing  PA pressure.   3. Mild mitral valve regurgitation.   4. The aortic valve is tricuspid. There is moderate calcification of the  aortic valve. Aortic valve regurgitation is not visualized. Mild aortic  valve stenosis. Aortic valve mean gradient measures 10.5 mmHg.  Dimentionless index 0.46.   5. The inferior vena cava is normal in size with greater than 50%  respiratory variability, suggesting right atrial pressure of 3 mmHg.   Plan:    1. HFrEF/NICM  - Her EF remains reduced at 35 to 40%  Volume satus is good   Keep on current regimen   Overall looks good  2. Paroxysmal Atrial Fibrillation - She denies any recent palpitations.  I am not convinced "dizzy " spells associated with an arrhythmia   Keep on Bisoprolol 5 mg daily and Eliquis   3. CAD - She had mild to moderate nonobstructive CAD by catheterization in  06/2020.  I am not convinced episode of chest discomfort on Sun represents angina  Follow    4. Aortic Stenosis -Continue to follow with periodic echoes.  MIld on echo in Nov 2022   5. LIpids  LDL 49 in 2023  Follow   6  CLL  Follows in oncology      Follow up in May or June 2025    Signed, Dietrich Pates, MD  02/07/2023 2:01 PM    [facility] [address] [place], [place] [address] Phone: [phone] Fax: [phone]                                                      .Marland Kitchen.....................................................................................................................................................................................................................................................................................................................................................................................................................................................   Cardiology Office Note    Date:  02/07/2023   ID:  [name], DOB Jun 12, 1935, MRN [id number]  PCP:  Billie Lade, MD  Cardiologist: Dietrich Pates, MD    Patient presents for follow up of PAF and HFrEF   History of Present Illness:    [name] is a 87 y.o. female with past medical history of paroxysmal atrial fibrillation, HFrEF/NICM (EF previously 55-60% in 06/2018, reduced to 40-45% by repeat echo in 03/2019 and at 35-40% by echo in  atrial pressure.   2. Right ventricular systolic function is normal. The right ventricular  size is normal. There is mildly elevated pulmonary artery systolic   pressure.   3. Left atrial size was moderately dilated.   4. Right atrial size was mild to moderately dilated.   5. The mitral valve is abnormal. Mild mitral valve regurgitation. Mild  mitral stenosis. Moderate mitral annular calcification.   6. The aortic valve is tricuspid. There is moderate calcification of the  aortic valve. There is moderate thickening of the aortic valve. Aortic  valve regurgitation is not visualized. Mild aortic valve stenosis.   FINDINGS   Left Ventricle: The anteroseptum, anterior    1. Left ventricular ejection fraction, by estimation, is 35 to 40%. The  left ventricle has moderately decreased function. The left ventricle  demonstrates regional wall motion abnormalities (see scoring  diagram/findings for description). The left  ventricular internal cavity size was mildly dilated. Left ventricular  diastolic parameters are consistent with Grade I diastolic dysfunction  (impaired relaxation).   2. Right ventricular systolic function is normal. The right ventricular  size is normal. Tricuspid regurgitation signal is inadequate for assessing  PA pressure.   3. Mild mitral valve regurgitation.   4. The aortic valve is tricuspid. There is moderate calcification of the  aortic valve. Aortic valve regurgitation is not visualized. Mild aortic  valve stenosis. Aortic valve mean gradient measures 10.5 mmHg.  Dimentionless index 0.46.   5. The inferior vena cava is normal in size with greater than 50%  respiratory variability, suggesting right atrial pressure of 3 mmHg.   Plan:    1. HFrEF/NICM  - Her EF remains reduced at 35 to 40%  Volume satus is good   Keep on current regimen   Overall looks good  2. Paroxysmal Atrial Fibrillation - She denies any recent palpitations.  I am not convinced "dizzy " spells associated with an arrhythmia   Keep on Bisoprolol 5 mg daily and Eliquis   3. CAD - She had mild to moderate nonobstructive CAD by catheterization in  06/2020.  I am not convinced episode of chest discomfort on Sun represents angina  Follow    4. Aortic Stenosis -Continue to follow with periodic echoes.  MIld on echo in Nov 2022   5. LIpids  LDL 49 in 2023  Follow   6  CLL  Follows in oncology      Follow up in May or June 2025    Signed, Dietrich Pates, MD  02/07/2023 2:01 PM    [facility] [address] [place], [place] [address] Phone: [phone] Fax: [phone]                                                      .Marland Kitchen.....................................................................................................................................................................................................................................................................................................................................................................................................................................................   Cardiology Office Note    Date:  02/07/2023   ID:  [name], DOB Jun 12, 1935, MRN [id number]  PCP:  Billie Lade, MD  Cardiologist: Dietrich Pates, MD    Patient presents for follow up of PAF and HFrEF   History of Present Illness:    [name] is a 87 y.o. female with past medical history of paroxysmal atrial fibrillation, HFrEF/NICM (EF previously 55-60% in 06/2018, reduced to 40-45% by repeat echo in 03/2019 and at 35-40% by echo in  atrial pressure.   2. Right ventricular systolic function is normal. The right ventricular  size is normal. There is mildly elevated pulmonary artery systolic   pressure.   3. Left atrial size was moderately dilated.   4. Right atrial size was mild to moderately dilated.   5. The mitral valve is abnormal. Mild mitral valve regurgitation. Mild  mitral stenosis. Moderate mitral annular calcification.   6. The aortic valve is tricuspid. There is moderate calcification of the  aortic valve. There is moderate thickening of the aortic valve. Aortic  valve regurgitation is not visualized. Mild aortic valve stenosis.   FINDINGS   Left Ventricle: The anteroseptum, anterior    1. Left ventricular ejection fraction, by estimation, is 35 to 40%. The  left ventricle has moderately decreased function. The left ventricle  demonstrates regional wall motion abnormalities (see scoring  diagram/findings for description). The left  ventricular internal cavity size was mildly dilated. Left ventricular  diastolic parameters are consistent with Grade I diastolic dysfunction  (impaired relaxation).   2. Right ventricular systolic function is normal. The right ventricular  size is normal. Tricuspid regurgitation signal is inadequate for assessing  PA pressure.   3. Mild mitral valve regurgitation.   4. The aortic valve is tricuspid. There is moderate calcification of the  aortic valve. Aortic valve regurgitation is not visualized. Mild aortic  valve stenosis. Aortic valve mean gradient measures 10.5 mmHg.  Dimentionless index 0.46.   5. The inferior vena cava is normal in size with greater than 50%  respiratory variability, suggesting right atrial pressure of 3 mmHg.   Plan:    1. HFrEF/NICM  - Her EF remains reduced at 35 to 40%  Volume satus is good   Keep on current regimen   Overall looks good  2. Paroxysmal Atrial Fibrillation - She denies any recent palpitations.  I am not convinced "dizzy " spells associated with an arrhythmia   Keep on Bisoprolol 5 mg daily and Eliquis   3. CAD - She had mild to moderate nonobstructive CAD by catheterization in  06/2020.  I am not convinced episode of chest discomfort on Sun represents angina  Follow    4. Aortic Stenosis -Continue to follow with periodic echoes.  MIld on echo in Nov 2022   5. LIpids  LDL 49 in 2023  Follow   6  CLL  Follows in oncology      Follow up in May or June 2025    Signed, Dietrich Pates, MD  02/07/2023 2:01 PM    [facility] [address] [place], [place] [address] Phone: [phone] Fax: [phone]                                                      .Marland Kitchen.....................................................................................................................................................................................................................................................................................................................................................................................................................................................   Cardiology Office Note    Date:  02/07/2023   ID:  [name], DOB Jun 12, 1935, MRN [id number]  PCP:  Billie Lade, MD  Cardiologist: Dietrich Pates, MD    Patient presents for follow up of PAF and HFrEF   History of Present Illness:    [name] is a 87 y.o. female with past medical history of paroxysmal atrial fibrillation, HFrEF/NICM (EF previously 55-60% in 06/2018, reduced to 40-45% by repeat echo in 03/2019 and at 35-40% by echo in  atrial pressure.   2. Right ventricular systolic function is normal. The right ventricular  size is normal. There is mildly elevated pulmonary artery systolic   pressure.   3. Left atrial size was moderately dilated.   4. Right atrial size was mild to moderately dilated.   5. The mitral valve is abnormal. Mild mitral valve regurgitation. Mild  mitral stenosis. Moderate mitral annular calcification.   6. The aortic valve is tricuspid. There is moderate calcification of the  aortic valve. There is moderate thickening of the aortic valve. Aortic  valve regurgitation is not visualized. Mild aortic valve stenosis.   FINDINGS   Left Ventricle: The anteroseptum, anterior    1. Left ventricular ejection fraction, by estimation, is 35 to 40%. The  left ventricle has moderately decreased function. The left ventricle  demonstrates regional wall motion abnormalities (see scoring  diagram/findings for description). The left  ventricular internal cavity size was mildly dilated. Left ventricular  diastolic parameters are consistent with Grade I diastolic dysfunction  (impaired relaxation).   2. Right ventricular systolic function is normal. The right ventricular  size is normal. Tricuspid regurgitation signal is inadequate for assessing  PA pressure.   3. Mild mitral valve regurgitation.   4. The aortic valve is tricuspid. There is moderate calcification of the  aortic valve. Aortic valve regurgitation is not visualized. Mild aortic  valve stenosis. Aortic valve mean gradient measures 10.5 mmHg.  Dimentionless index 0.46.   5. The inferior vena cava is normal in size with greater than 50%  respiratory variability, suggesting right atrial pressure of 3 mmHg.   Plan:    1. HFrEF/NICM  - Her EF remains reduced at 35 to 40%  Volume satus is good   Keep on current regimen   Overall looks good  2. Paroxysmal Atrial Fibrillation - She denies any recent palpitations.  I am not convinced "dizzy " spells associated with an arrhythmia   Keep on Bisoprolol 5 mg daily and Eliquis   3. CAD - She had mild to moderate nonobstructive CAD by catheterization in  06/2020.  I am not convinced episode of chest discomfort on Sun represents angina  Follow    4. Aortic Stenosis -Continue to follow with periodic echoes.  MIld on echo in Nov 2022   5. LIpids  LDL 49 in 2023  Follow   6  CLL  Follows in oncology      Follow up in May or June 2025    Signed, Dietrich Pates, MD  02/07/2023 2:01 PM    [facility] [address] [place], [place] [address] Phone: [phone] Fax: [phone]                                                      .Marland Kitchen.....................................................................................................................................................................................................................................................................................................................................................................................................................................................   Cardiology Office Note    Date:  02/07/2023   ID:  [name], DOB Jun 12, 1935, MRN [id number]  PCP:  Billie Lade, MD  Cardiologist: Dietrich Pates, MD    Patient presents for follow up of PAF and HFrEF   History of Present Illness:    [name] is a 87 y.o. female with past medical history of paroxysmal atrial fibrillation, HFrEF/NICM (EF previously 55-60% in 06/2018, reduced to 40-45% by repeat echo in 03/2019 and at 35-40% by echo in  Cardiology Office Note    Date:  02/07/2023   ID:  MOON FULLILOVE, DOB Apr 01, 1936, MRN [id number]  PCP:  Billie Lade, MD  Cardiologist: Dietrich Pates, MD    Patient presents for follow up of PAF and HFrEF   History of Present Illness:    [name] is a 87 y.o. female with past medical history of paroxysmal atrial fibrillation, HFrEF/NICM (EF previously 55-60% in 06/2018, reduced to 40-45% by repeat echo in 03/2019 and at 35-40% by echo in 05/2020), CAD (cath in 06/2020 showing mild to moderate nonobstructive CAD), aortic stenosis, LBBB, CLL and COPD.  The pt has been on Bisoprolol, spironolactone, losartan.  She did not tolerate Entresto or Jardiance    I saw the pt in May 2023   She was seen by Flavia Shipper since    Since seen she says she has some lightheadedness  Not dizzy   Different  Spells will last about 4 to 5 min then go away     No syncope  No palpitations  The pt had a spell of chest pressure a couple nights ago   Sunday   Eased off on own   Not assocaited wit hactivity   Occurred at 9 PM   Lasted about 5 or 10 min   SOB with spell  Patted chest  It went away    No pain or discomfort since    [facility] does a lot of walking   Doing OK with this   No CP   Breathing ok with walking  Staying active Knee does limit some   Rare dizzy spell   Different from dizziness above    Past Medical History:  Diagnosis Date   Bowel obstruction (HCC)    Breast cancer (HCC)    Breast cancer, stage 3 (HCC) 07/21/2011   Bronchitis    CAD (coronary artery disease)    a. 06/2020 Cath: LM nl, LAD 18m, LCX min irregs, RCA mild diff dzs. PA 34/15(21), PCWP 15. CO 5.0 L/min. EF 25-35%, glob HK.   Chronic heart failure with preserved ejection fraction (HFpEF) (HCC)    a. 06/2018 Echo: EF 55-60%; b. 03/2019 Echo: EF 40-45%; c. 05/2020 Echo: EF 35-40%; d. 12/2020 Echo: EF 35-40%; e. 03/2021 Echo: EF 35-40%. mod LVH,GrI DD, nl RV fxn, mod dil LA/RA, mild MR/AS.   CLL (chronic lymphocytic leukemia)  (HCC) 07/21/2011   Migraines    NICM (nonischemic cardiomyopathy) (HCC)    PAF (paroxysmal atrial fibrillation) (HCC)    a. Dx 06/2018 in setting of COPD flare-->bb/eliquis (CHA2DS2VASc = 6); b. 02/2020 Zio: Predominantly sinus rhythm at an average heart rate of 60 (39-190).  3% atrial fibrillation burden and average of 127 (59-1 and 37), longest lasting 9 hours and 23 minutes.  43 runs of PSVT noted.  3 runs of nonsustained VT noted.   Pneumonia    PSVT (paroxysmal supraventricular tachycardia)    a. 02/2020 Zio: 43 rounds of symptomatic PSVT.  Longest 19 beats; fastest 164 bpm x 14 beats.    Past Surgical History:  Procedure Laterality Date   ABDOMINAL HYSTERECTOMY     APPENDECTOMY     CATARACT EXTRACTION Bilateral 2023   CHOLECYSTECTOMY     LAPAROTOMY N/A 06/08/2015   Procedure: EXPLORATORY LAPAROTOMY;  Surgeon: Franky Macho, MD;  Location: [facility];  Service: General;  Laterality: N/A;   LYSIS OF ADHESION N/A 06/08/2015   Procedure: LYSIS OF ADHESION;  Surgeon: Franky Macho, MD;  Location: [facility];  atrial pressure.   2. Right ventricular systolic function is normal. The right ventricular  size is normal. There is mildly elevated pulmonary artery systolic   pressure.   3. Left atrial size was moderately dilated.   4. Right atrial size was mild to moderately dilated.   5. The mitral valve is abnormal. Mild mitral valve regurgitation. Mild  mitral stenosis. Moderate mitral annular calcification.   6. The aortic valve is tricuspid. There is moderate calcification of the  aortic valve. There is moderate thickening of the aortic valve. Aortic  valve regurgitation is not visualized. Mild aortic valve stenosis.   FINDINGS   Left Ventricle: The anteroseptum, anterior    1. Left ventricular ejection fraction, by estimation, is 35 to 40%. The  left ventricle has moderately decreased function. The left ventricle  demonstrates regional wall motion abnormalities (see scoring  diagram/findings for description). The left  ventricular internal cavity size was mildly dilated. Left ventricular  diastolic parameters are consistent with Grade I diastolic dysfunction  (impaired relaxation).   2. Right ventricular systolic function is normal. The right ventricular  size is normal. Tricuspid regurgitation signal is inadequate for assessing  PA pressure.   3. Mild mitral valve regurgitation.   4. The aortic valve is tricuspid. There is moderate calcification of the  aortic valve. Aortic valve regurgitation is not visualized. Mild aortic  valve stenosis. Aortic valve mean gradient measures 10.5 mmHg.  Dimentionless index 0.46.   5. The inferior vena cava is normal in size with greater than 50%  respiratory variability, suggesting right atrial pressure of 3 mmHg.   Plan:    1. HFrEF/NICM  - Her EF remains reduced at 35 to 40%  Volume satus is good   Keep on current regimen   Overall looks good  2. Paroxysmal Atrial Fibrillation - She denies any recent palpitations.  I am not convinced "dizzy " spells associated with an arrhythmia   Keep on Bisoprolol 5 mg daily and Eliquis   3. CAD - She had mild to moderate nonobstructive CAD by catheterization in  06/2020.  I am not convinced episode of chest discomfort on Sun represents angina  Follow    4. Aortic Stenosis -Continue to follow with periodic echoes.  MIld on echo in Nov 2022   5. LIpids  LDL 49 in 2023  Follow   6  CLL  Follows in oncology      Follow up in May or June 2025    Signed, Dietrich Pates, MD  02/07/2023 2:01 PM    [facility] [address] [place], [place] [address] Phone: [phone] Fax: [phone]                                                      .Marland Kitchen.....................................................................................................................................................................................................................................................................................................................................................................................................................................................   Cardiology Office Note    Date:  02/07/2023   ID:  [name], DOB Jun 12, 1935, MRN [id number]  PCP:  Billie Lade, MD  Cardiologist: Dietrich Pates, MD    Patient presents for follow up of PAF and HFrEF   History of Present Illness:    [name] is a 87 y.o. female with past medical history of paroxysmal atrial fibrillation, HFrEF/NICM (EF previously 55-60% in 06/2018, reduced to 40-45% by repeat echo in 03/2019 and at 35-40% by echo in  atrial pressure.   2. Right ventricular systolic function is normal. The right ventricular  size is normal. There is mildly elevated pulmonary artery systolic   pressure.   3. Left atrial size was moderately dilated.   4. Right atrial size was mild to moderately dilated.   5. The mitral valve is abnormal. Mild mitral valve regurgitation. Mild  mitral stenosis. Moderate mitral annular calcification.   6. The aortic valve is tricuspid. There is moderate calcification of the  aortic valve. There is moderate thickening of the aortic valve. Aortic  valve regurgitation is not visualized. Mild aortic valve stenosis.   FINDINGS   Left Ventricle: The anteroseptum, anterior    1. Left ventricular ejection fraction, by estimation, is 35 to 40%. The  left ventricle has moderately decreased function. The left ventricle  demonstrates regional wall motion abnormalities (see scoring  diagram/findings for description). The left  ventricular internal cavity size was mildly dilated. Left ventricular  diastolic parameters are consistent with Grade I diastolic dysfunction  (impaired relaxation).   2. Right ventricular systolic function is normal. The right ventricular  size is normal. Tricuspid regurgitation signal is inadequate for assessing  PA pressure.   3. Mild mitral valve regurgitation.   4. The aortic valve is tricuspid. There is moderate calcification of the  aortic valve. Aortic valve regurgitation is not visualized. Mild aortic  valve stenosis. Aortic valve mean gradient measures 10.5 mmHg.  Dimentionless index 0.46.   5. The inferior vena cava is normal in size with greater than 50%  respiratory variability, suggesting right atrial pressure of 3 mmHg.   Plan:    1. HFrEF/NICM  - Her EF remains reduced at 35 to 40%  Volume satus is good   Keep on current regimen   Overall looks good  2. Paroxysmal Atrial Fibrillation - She denies any recent palpitations.  I am not convinced "dizzy " spells associated with an arrhythmia   Keep on Bisoprolol 5 mg daily and Eliquis   3. CAD - She had mild to moderate nonobstructive CAD by catheterization in  06/2020.  I am not convinced episode of chest discomfort on Sun represents angina  Follow    4. Aortic Stenosis -Continue to follow with periodic echoes.  MIld on echo in Nov 2022   5. LIpids  LDL 49 in 2023  Follow   6  CLL  Follows in oncology      Follow up in May or June 2025    Signed, Dietrich Pates, MD  02/07/2023 2:01 PM    [facility] [address] [place], [place] [address] Phone: [phone] Fax: [phone]                                                      .Marland Kitchen.....................................................................................................................................................................................................................................................................................................................................................................................................................................................   Cardiology Office Note    Date:  02/07/2023   ID:  [name], DOB Jun 12, 1935, MRN [id number]  PCP:  Billie Lade, MD  Cardiologist: Dietrich Pates, MD    Patient presents for follow up of PAF and HFrEF   History of Present Illness:    [name] is a 87 y.o. female with past medical history of paroxysmal atrial fibrillation, HFrEF/NICM (EF previously 55-60% in 06/2018, reduced to 40-45% by repeat echo in 03/2019 and at 35-40% by echo in  Cardiology Office Note    Date:  02/07/2023   ID:  MOON FULLILOVE, DOB Apr 01, 1936, MRN [id number]  PCP:  Billie Lade, MD  Cardiologist: Dietrich Pates, MD    Patient presents for follow up of PAF and HFrEF   History of Present Illness:    [name] is a 87 y.o. female with past medical history of paroxysmal atrial fibrillation, HFrEF/NICM (EF previously 55-60% in 06/2018, reduced to 40-45% by repeat echo in 03/2019 and at 35-40% by echo in 05/2020), CAD (cath in 06/2020 showing mild to moderate nonobstructive CAD), aortic stenosis, LBBB, CLL and COPD.  The pt has been on Bisoprolol, spironolactone, losartan.  She did not tolerate Entresto or Jardiance    I saw the pt in May 2023   She was seen by Flavia Shipper since    Since seen she says she has some lightheadedness  Not dizzy   Different  Spells will last about 4 to 5 min then go away     No syncope  No palpitations  The pt had a spell of chest pressure a couple nights ago   Sunday   Eased off on own   Not assocaited wit hactivity   Occurred at 9 PM   Lasted about 5 or 10 min   SOB with spell  Patted chest  It went away    No pain or discomfort since    [facility] does a lot of walking   Doing OK with this   No CP   Breathing ok with walking  Staying active Knee does limit some   Rare dizzy spell   Different from dizziness above    Past Medical History:  Diagnosis Date   Bowel obstruction (HCC)    Breast cancer (HCC)    Breast cancer, stage 3 (HCC) 07/21/2011   Bronchitis    CAD (coronary artery disease)    a. 06/2020 Cath: LM nl, LAD 18m, LCX min irregs, RCA mild diff dzs. PA 34/15(21), PCWP 15. CO 5.0 L/min. EF 25-35%, glob HK.   Chronic heart failure with preserved ejection fraction (HFpEF) (HCC)    a. 06/2018 Echo: EF 55-60%; b. 03/2019 Echo: EF 40-45%; c. 05/2020 Echo: EF 35-40%; d. 12/2020 Echo: EF 35-40%; e. 03/2021 Echo: EF 35-40%. mod LVH,GrI DD, nl RV fxn, mod dil LA/RA, mild MR/AS.   CLL (chronic lymphocytic leukemia)  (HCC) 07/21/2011   Migraines    NICM (nonischemic cardiomyopathy) (HCC)    PAF (paroxysmal atrial fibrillation) (HCC)    a. Dx 06/2018 in setting of COPD flare-->bb/eliquis (CHA2DS2VASc = 6); b. 02/2020 Zio: Predominantly sinus rhythm at an average heart rate of 60 (39-190).  3% atrial fibrillation burden and average of 127 (59-1 and 37), longest lasting 9 hours and 23 minutes.  43 runs of PSVT noted.  3 runs of nonsustained VT noted.   Pneumonia    PSVT (paroxysmal supraventricular tachycardia)    a. 02/2020 Zio: 43 rounds of symptomatic PSVT.  Longest 19 beats; fastest 164 bpm x 14 beats.    Past Surgical History:  Procedure Laterality Date   ABDOMINAL HYSTERECTOMY     APPENDECTOMY     CATARACT EXTRACTION Bilateral 2023   CHOLECYSTECTOMY     LAPAROTOMY N/A 06/08/2015   Procedure: EXPLORATORY LAPAROTOMY;  Surgeon: Franky Macho, MD;  Location: [facility];  Service: General;  Laterality: N/A;   LYSIS OF ADHESION N/A 06/08/2015   Procedure: LYSIS OF ADHESION;  Surgeon: Franky Macho, MD;  Location: [facility];

## 2023-02-07 ENCOUNTER — Ambulatory Visit: Payer: Medicare HMO | Attending: Internal Medicine | Admitting: Internal Medicine

## 2023-02-07 ENCOUNTER — Other Ambulatory Visit (HOSPITAL_COMMUNITY)
Admission: RE | Admit: 2023-02-07 | Discharge: 2023-02-07 | Disposition: A | Discharge status: Home / Self Care | Payer: Medicare HMO | Source: Ambulatory Visit | Attending: Internal Medicine | Admitting: Internal Medicine

## 2023-02-07 ENCOUNTER — Encounter: Payer: Self-pay | Admitting: Internal Medicine

## 2023-02-07 VITALS — BP 112/70 | HR 59 | Ht 61.0 in | Wt 155.6 lb

## 2023-02-07 DIAGNOSIS — E785 Hyperlipidemia, unspecified: Secondary | ICD-10-CM | POA: Insufficient documentation

## 2023-02-07 LAB — LIPID PANEL
Cholesterol: 124 mg/dL (ref 0–200)
HDL: 61 mg/dL (ref >40)
LDL Cholesterol: 48 mg/dL (ref 0–99)
Total CHOL/HDL Ratio: 2 ratio
Triglycerides: 73 mg/dL (ref <150)
VLDL: 15 mg/dL (ref 0–40)

## 2023-02-07 NOTE — Patient Instructions (Signed)
Medication Instructions:  Your physician recommends that you continue on your current medications as directed. Please refer to the Current Medication list given to you today.  *If you need a refill on your cardiac medications before your next appointment, please call your pharmacy*   Lab Work: Your physician recommends that you return for lab work in: Today   If you have labs (blood work) drawn today and your tests are completely normal, you will receive your results only by: MyChart Message (if you have MyChart) OR A paper copy in the mail If you have any lab test that is abnormal or we need to change your treatment, we will call you to review the results.   Testing/Procedures: NONE    Follow-Up: At [facility], you and your health needs are our priority.  As part of our continuing mission to provide you with exceptional heart care, we have created designated Provider Care Teams.  These Care Teams include your primary Cardiologist (physician) and Advanced Practice Providers (APPs -  Physician Assistants and Nurse Practitioners) who all work together to provide you with the care you need, when you need it.  We recommend signing up for the patient portal called "MyChart".  Sign up information is provided on this After Visit Summary.  MyChart is used to connect with patients for Virtual Visits (Telemedicine).  Patients are able to view lab/test results, encounter notes, upcoming appointments, etc.  Non-urgent messages can be sent to your provider as well.   To learn more about what you can do with MyChart, go to [email or web address].    Your next appointment:    May /June   Provider:   You may see Dietrich Pates, MD or one of the following Advanced Practice Providers on your designated Care Team:   Randall An, PA-C  Jacolyn Reedy, PA-C     Other Instructions Thank you for choosing [facility]!

## 2023-02-10 ENCOUNTER — Telehealth: Payer: Self-pay | Admitting: Internal Medicine

## 2023-02-10 ENCOUNTER — Other Ambulatory Visit: Payer: Self-pay

## 2023-02-10 MED ORDER — VEOZAH 45 MG PO TABS: 1.0000 | ORAL_TABLET | Freq: Every day | ORAL | 0 refills | Status: DC

## 2023-02-10 NOTE — Telephone Encounter (Signed)
VEOZAH 45 MG TABS - would like to have a refill sent into  [facility]  30 pills monthly

## 2023-02-10 NOTE — Telephone Encounter (Signed)
Was told By Monia Pouch that Dr Durwin Nora needed to change something so that they can assign him as her PCP on her card.    They have her listed with [facility]  She wanted to know if they have contacted your office, to see if they were able to help correct.  Thank you,  Judeth Cornfield,  AMB Clinical Support [facility] AWV Program Direct Dial ??[phone]

## 2023-02-10 NOTE — Telephone Encounter (Signed)
Refills sent to pharmacy. 

## 2023-02-17 NOTE — Telephone Encounter (Signed)
I will handle. Thank you!

## 2023-02-21 ENCOUNTER — Ambulatory Visit (INDEPENDENT_AMBULATORY_CARE_PROVIDER_SITE_OTHER): Payer: Medicare HMO

## 2023-02-21 VITALS — Ht 61.0 in | Wt 152.0 lb

## 2023-02-21 DIAGNOSIS — Z0000 Encounter for general adult medical examination without abnormal findings: Secondary | ICD-10-CM | POA: Diagnosis not present

## 2023-02-21 NOTE — Progress Notes (Signed)
Because this visit was a virtual/telehealth visit,  certain criteria was not obtained, such a blood pressure, CBG if applicable, and timed get up and go. Any medications not marked as "taking" were not mentioned during the medication reconciliation part of the visit. Any vitals not documented were not able to be obtained due to this being a telehealth visit or patient was unable to self-report a recent blood pressure reading due to a lack of equipment at home via telehealth. Vitals that have been documented are verbally provided by the patient.   Subjective:   [name] is a 87 y.o. female who presents for Medicare Annual (Subsequent) preventive examination.  Visit Complete: Virtual  I connected with  [name] on 02/21/23 by a audio enabled telemedicine application and verified that I am speaking with the correct person using two identifiers.  Patient Location: Home  Provider Location: Home Office  I discussed the limitations of evaluation and management by telemedicine. The patient expressed understanding and agreed to proceed.  Patient Medicare AWV questionnaire was completed by the patient on na; I have confirmed that all information answered by patient is correct and no changes since this date.  Cardiac Risk Factors include: advanced age (>24men, >74 women);dyslipidemia;hypertension     Objective:    Today's Vitals   02/21/23 1512  Weight: 152 lb (68.9 kg)  Height: 5\' 1"  (1.549 m)   Body mass index is 28.72 kg/m.     02/21/2023    3:12 PM 11/09/2022    3:06 PM 05/23/2022   11:00 PM 05/03/2022    2:07 PM 11/01/2021    2:25 PM 05/13/2021    5:08 PM 04/27/2021    2:19 PM  Advanced Directives  Does Patient Have a Medical Advance Directive? Yes Yes Yes No Yes Yes Yes  Type of Estate agent of [place];Living will Healthcare Power of [place];Living will Living will  Healthcare Power of [place];Living will Healthcare Power of [employer] of [place];Living will  Does patient want to make changes to medical advance directive? No - Patient declined No - Patient declined No - Patient declined  No - Patient declined  No - Patient declined  Copy of Healthcare Power of Attorney in Chart? Yes - validated most recent copy scanned in chart (See row information) Yes - validated most recent copy scanned in chart (See row information)   No - copy requested No - copy requested No - copy requested  Would patient like information on creating a medical advance directive?   No - Patient declined No - Patient declined No - Patient declined      Current Medications (verified) Outpatient Encounter Medications as of 02/21/2023  Medication Sig   acetaminophen (TYLENOL) 500 MG tablet Take 1,000 mg by mouth every 6 (six) hours as needed for moderate pain or headache.   albuterol (PROVENTIL HFA;VENTOLIN HFA) 108 (90 BASE) MCG/ACT inhaler Inhale 2 puffs into the lungs every 6 (six) hours as needed for shortness of breath.   albuterol (PROVENTIL) (2.5 MG/3ML) 0.083% nebulizer solution Take 3 mLs (2.5 mg total) by nebulization every 6 (six) hours as needed for wheezing or shortness of breath (when not relieved by inhaler.).   apixaban (ELIQUIS) 5 MG TABS tablet TAKE 1 TABLET BY MOUTH TWICE A DAY   atorvastatin (LIPITOR) 20 MG tablet Take 1 tablet (20 mg total) by mouth daily.   bisoprolol (ZEBETA) 5 MG tablet TAKE (1/2) TABLET BY MOUTH ONCE DAILY.   cholecalciferol (VITAMIN D3)  25 MCG (1000 UNIT) tablet Take 1 capsule by mouth daily.   diphenhydrAMINE (BENADRYL) 25 MG tablet Take 25 mg by mouth daily as needed for allergies.   losartan (COZAAR) 25 MG tablet TAKE (1/2) TABLET BY MOUTH DAILY   Misc. 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 Dx: C50.911   omeprazole (PRILOSEC) 20 MG capsule Take 20 mg by mouth daily.   ondansetron (ZOFRAN) 4 MG tablet Take 1 tablet (4 mg total) by mouth every 8 (eight) hours  as needed for nausea or vomiting.   spironolactone (ALDACTONE) 25 MG tablet TAKE (1) TABLET BY MOUTH DAILY.   VEOZAH 45 MG TABS Take 1 tablet (45 mg total) by mouth daily.   No facility-administered encounter medications on file as of 02/21/2023.    Allergies (verified) Codeine, Meclizine, Sulfa antibiotics, Entresto [sacubitril-valsartan], Jardiance [empagliflozin], and Zoloft [sertraline hcl]   History: Past Medical History:  Diagnosis Date   Bowel obstruction (HCC)    Breast cancer (HCC)    Breast cancer, stage 3 (HCC) 07/21/2011   Bronchitis    CAD (coronary artery disease)    a. 06/2020 Cath: LM nl, LAD 36m, LCX min irregs, RCA mild diff dzs. PA 34/15(21), PCWP 15. CO 5.0 L/min. EF 25-35%, glob HK.   Chronic heart failure with preserved ejection fraction (HFpEF) (HCC)    a. 06/2018 Echo: EF 55-60%; b. 03/2019 Echo: EF 40-45%; c. 05/2020 Echo: EF 35-40%; d. 12/2020 Echo: EF 35-40%; e. 03/2021 Echo: EF 35-40%. mod LVH,GrI DD, nl RV fxn, mod dil LA/RA, mild MR/AS.   CLL (chronic lymphocytic leukemia) (HCC) 07/21/2011   Migraines    NICM (nonischemic cardiomyopathy) (HCC)    PAF (paroxysmal atrial fibrillation) (HCC)    a. Dx 06/2018 in setting of COPD flare-->bb/eliquis (CHA2DS2VASc = 6); b. 02/2020 Zio: Predominantly sinus rhythm at an average heart rate of 60 (39-190).  3% atrial fibrillation burden and average of 127 (59-1 and 37), longest lasting 9 hours and 23 minutes.  43 runs of PSVT noted.  3 runs of nonsustained VT noted.   Pneumonia    PSVT (paroxysmal supraventricular tachycardia) (HCC)    a. 02/2020 Zio: 43 rounds of symptomatic PSVT.  Longest 19 beats; fastest 164 bpm x 14 beats.   Past Surgical History:  Procedure Laterality Date   ABDOMINAL HYSTERECTOMY     APPENDECTOMY     CATARACT EXTRACTION Bilateral 2023   CHOLECYSTECTOMY     LAPAROTOMY N/A 06/08/2015   Procedure: EXPLORATORY LAPAROTOMY;  Surgeon: Franky Macho, MD;  Location: [facility];  Service: General;  Laterality:  N/A;   LYSIS OF ADHESION N/A 06/08/2015   Procedure: LYSIS OF ADHESION;  Surgeon: Franky Macho, MD;  Location: [facility];  Service: General;  Laterality: N/A;   MASTECTOMY     right   RIGHT/LEFT HEART CATH AND CORONARY ANGIOGRAPHY N/A 07/09/2020   Procedure: RIGHT/LEFT HEART CATH AND CORONARY ANGIOGRAPHY;  Surgeon: Yvonne Kendall, MD;  Location: [facility];  Service: Cardiovascular;  Laterality: N/A;   TOTAL HIP ARTHROPLASTY     right   Family History  Problem Relation Age of Onset   Cancer Brother    Cancer Other    Social History   Socioeconomic History   Marital status: Widowed    Spouse name: Not on file   Number of children: Not on file   Years of education: 11   Highest education level: Not on file  Occupational History   Not on file  Tobacco Use  Smoking status: Former    Current packs/day: 0.00    Types: Cigarettes    Quit date: 10/26/1978    Years since quitting: 44.3   Smokeless tobacco: Never  Vaping Use   Vaping status: Never Used  Substance and Sexual Activity   Alcohol use: No   Drug use: No   Sexual activity: Not Currently  Other Topics Concern   Not on file  Social History Narrative   Pt lives by herself   Social Determinants of Health   Financial Resource Strain: Low Risk  (02/21/2023)   Overall Financial Resource Strain (CARDIA)    Difficulty of Paying Living Expenses: Not hard at all  Food Insecurity: No Food Insecurity (02/21/2023)   Hunger Vital Sign    Worried About Running Out of Food in the Last Year: Never true    Ran Out of Food in the Last Year: Never true  Transportation Needs: No Transportation Needs (02/21/2023)   PRAPARE - Administrator, Civil Service (Medical): No    Lack of Transportation (Non-Medical): No  Physical Activity: Insufficiently Active (02/21/2023)   Exercise Vital Sign    Days of Exercise per Week: 2 days    Minutes of Exercise per Session: 20 min  Stress: No Stress Concern Present (02/21/2023)    [employer] of Occupational Health - Occupational Stress Questionnaire    Feeling of Stress : Not at all  Social Connections: Moderately Isolated (02/21/2023)   Social Connection and Isolation Panel [NHANES]    Frequency of Communication with Friends and Family: More than three times a week    Frequency of Social Gatherings with Friends and Family: More than three times a week    Attends Religious Services: More than 4 times per year    Active Member of [employer] or Organizations: No    Attends Banker Meetings: Never    Marital Status: Widowed    Tobacco Counseling Counseling given: Yes   Clinical Intake:  Pre-visit preparation completed: Yes  Pain : No/denies pain     BMI - recorded: 28.72 Nutritional Status: BMI 25 -29 Overweight Nutritional Risks: None Diabetes: No  How often do you need to have someone help you when you read instructions, pamphlets, or other written materials from your doctor or pharmacy?: 1 - Never  Interpreter Needed?: No  Information entered by :: Abby Amil Bouwman, CMA   Activities of Daily Living    02/21/2023    3:28 PM 05/23/2022   11:00 PM  In your present state of health, do you have any difficulty performing the following activities:  Hearing? 0 1  Vision? 0 0  Difficulty concentrating or making decisions? 0 0  Walking or climbing stairs? 0 1  Dressing or bathing? 0 1  Doing errands, shopping? 0 0  Preparing Food and eating ? N   Using the Toilet? N   In the past six months, have you accidently leaked urine? N   Do you have problems with loss of bowel control? N   Managing your Medications? N   Managing your Finances? N   Housekeeping or managing your Housekeeping? N     Patient Care Team: Billie Lade, MD as PCP - General (Internal Medicine) Pricilla Riffle, MD as PCP - Cardiology (Cardiology)  Indicate any recent Medical Services you may have received from other than [facility] providers in the past year (date may be  approximate).     Assessment:   This is a routine wellness examination  for [employer].  Hearing/Vision screen Hearing Screening - Comments:: Patient c/o of hearing difficulties and referral placed for audiology today Vision Screening - Comments:: Had cataract surgery and no longer needs glasses except for readers.    Goals Addressed             This Visit's Progress    Patient Stated       To get rid of my cane       Depression Screen    02/21/2023    3:17 PM 12/28/2022    1:16 PM 04/09/2020   10:53 AM  PHQ 2/9 Scores  PHQ - 2 Score 0 5 0  PHQ- 9 Score 0 9     Fall Risk    02/21/2023    3:25 PM 12/28/2022    1:16 PM  Fall Risk   Falls in the past year? 0 0  Number falls in past yr: 0 0  Injury with Fall? 0 0  Risk for fall due to : Impaired balance/gait;Impaired mobility;Orthopedic patient No Fall Risks  Follow up Falls prevention discussed Falls evaluation completed    MEDICARE RISK AT HOME: Medicare Risk at Home Any stairs in or around the home?: No If so, are there any without handrails?: No Home free of loose throw rugs in walkways, pet beds, electrical cords, etc?: Yes Adequate lighting in your home to reduce risk of falls?: Yes Use of a cane, walker or w/c?: Yes Grab bars in the bathroom?: Yes Elevated toilet seat or a handicapped toilet?: Yes  TIMED UP AND GO:  Was the test performed?  No    Cognitive Function:        02/21/2023    3:15 PM  6CIT Screen  What Year? 0 points  What month? 0 points  What time? 0 points  Count back from 20 0 points  Months in reverse 0 points  Repeat phrase 0 points  Total Score 0 points    Immunizations Immunization History  Administered Date(s) Administered   Fluad Quad(high Dose 65+) 05/24/2022   Influenza Split 03/26/2012   Influenza Whole 03/02/2007, 02/20/2008   Influenza, High Dose Seasonal PF 02/28/2018, 02/01/2019   Influenza-Unspecified 01/08/2020   Moderna Sars-Covid-2 Vaccination 07/10/2019,  08/07/2019, 03/16/2020   Pneumococcal Conjugate-13 04/03/2018   Pneumococcal Polysaccharide-23 03/02/2007    TDAP status: Due, Education has been provided regarding the importance of this vaccine. Advised may receive this vaccine at local pharmacy or Health Dept. Aware to provide a copy of the vaccination record if obtained from local pharmacy or Health Dept. Verbalized acceptance and understanding.  Flu Vaccine status: Due, Education has been provided regarding the importance of this vaccine. Advised may receive this vaccine at local pharmacy or Health Dept. Aware to provide a copy of the vaccination record if obtained from local pharmacy or Health Dept. Verbalized acceptance and understanding.  Pneumococcal vaccine status: Up to date  Covid-19 vaccine status: Information provided on how to obtain vaccines.   Qualifies for Shingles Vaccine? Yes   Zostavax completed No   Shingrix Completed?: No.    Education has been provided regarding the importance of this vaccine. Patient has been advised to call insurance company to determine out of pocket expense if they have not yet received this vaccine. Advised may also receive vaccine at local pharmacy or Health Dept. Verbalized acceptance and understanding.  Screening Tests Health Maintenance  Topic Date Due   Medicare Annual Wellness (AWV)  Never done   DTaP/Tdap/Td (1 - Tdap) Never done  Zoster Vaccines- Shingrix (1 of 2) Never done   DEXA SCAN  Never done   INFLUENZA VACCINE  12/22/2022   COVID-19 Vaccine (4 - 2023-24 season) 01/22/2023   Pneumonia Vaccine 31+ Years old  Completed   HPV VACCINES  Aged Out    Health Maintenance  Health Maintenance Due  Topic Date Due   Medicare Annual Wellness (AWV)  Never done   DTaP/Tdap/Td (1 - Tdap) Never done   Zoster Vaccines- Shingrix (1 of 2) Never done   DEXA SCAN  Never done   INFLUENZA VACCINE  12/22/2022   COVID-19 Vaccine (4 - 2023-24 season) 01/22/2023    Colorectal cancer  screening: No longer required.   Mammogram status: No longer required due to age.  Bone Density Screening: Not age appropriate for this patient.    Lung Cancer Screening: (Low Dose CT Chest recommended if Age 27-80 years, 20 pack-year currently smoking OR have quit w/in 15years.) does not qualify.   Lung Cancer Screening Referral: na  Additional Screening:  Hepatitis C Screening: does not qualify;   Vision Screening: Recommended annual ophthalmology exams for early detection of glaucoma and other disorders of the eye. Is the patient up to date with their annual eye exam?  No  Who is the provider or what is the name of the office in which the patient attends annual eye exams? No longer sees an eye doctor since having cataract surgery If pt is not established with a provider, would they like to be referred to a provider to establish care? No .   Dental Screening: Recommended annual dental exams for proper oral hygiene  Diabetic Foot Exam: na  Community Resource Referral / Chronic Care Management: CRR required this visit?  No   CCM required this visit?  No     Plan:     I have personally reviewed and noted the following in the patient's chart:   Medical and social history Use of alcohol, tobacco or illicit drugs  Current medications and supplements including opioid prescriptions. Patient is not currently taking opioid prescriptions. Functional ability and status Nutritional status Physical activity Advanced directives List of other physicians Hospitalizations, surgeries, and ER visits in previous 12 months Vitals Screenings to include cognitive, depression, and falls Referrals and appointments  In addition, I have reviewed and discussed with patient certain preventive protocols, quality metrics, and best practice recommendations. A written personalized care plan for preventive services as well as general preventive health recommendations were provided to patient.      Jordan Hawks Shade Rivenbark, CMA   02/21/2023   After Visit Summary: (Mail) Due to this being a telephonic visit, the after visit summary with patients personalized plan was offered to patient via mail   Nurse Notes: see routing comment

## 2023-02-21 NOTE — Patient Instructions (Signed)
[name] , Thank you for taking time to come for your Medicare Wellness Visit. I appreciate your ongoing commitment to your health goals. Please review the following plan we discussed and let me know if I can assist you in the future.   Referrals/Orders/Follow-Ups/Clinician Recommendations:  Next Medicare Annual Wellness Visit: May 29, 2024 at 10am virtual visit  This is a list of the screening recommended for you and due dates:  Health Maintenance  Topic Date Due   DTaP/Tdap/Td vaccine (1 - Tdap) Never done   Zoster (Shingles) Vaccine (1 of 2) Never done   DEXA scan (bone density measurement)  Never done   Flu Shot  12/22/2022   COVID-19 Vaccine (4 - 2023-24 season) 01/22/2023   Medicare Annual Wellness Visit  02/21/2024   Pneumonia Vaccine  Completed   HPV Vaccine  Aged Out    Advanced directives: (In Chart) A copy of your advanced directives are scanned into your chart should your provider ever need it.  Next Medicare Annual Wellness Visit scheduled for next year: Yes  Preventive Care 65 Years and Older, Female Preventive care refers to lifestyle choices and visits with your health care provider that can promote health and wellness. Preventive care visits are also called wellness exams. What can I expect for my preventive care visit? Counseling Your health care provider may ask you questions about your: Medical history, including: Past medical problems. Family medical history. Pregnancy and menstrual history. History of falls. Current health, including: Memory and ability to understand (cognition). Emotional well-being. Home life and relationship well-being. Sexual activity and sexual health. Lifestyle, including: Alcohol, nicotine or tobacco, and drug use. Access to firearms. Diet, exercise, and sleep habits. Work and work Astronomer. Sunscreen use. Safety issues such as seatbelt and bike helmet use. Physical exam Your health care provider will check  your: Height and weight. These may be used to calculate your BMI (body mass index). BMI is a measurement that tells if you are at a healthy weight. Waist circumference. This measures the distance around your waistline. This measurement also tells if you are at a healthy weight and may help predict your risk of certain diseases, such as type 2 diabetes and high blood pressure. Heart rate and blood pressure. Body temperature. Skin for abnormal spots. What immunizations do I need?  Vaccines are usually given at various ages, according to a schedule. Your health care provider will recommend vaccines for you based on your age, medical history, and lifestyle or other factors, such as travel or where you work. What tests do I need? Screening Your health care provider may recommend screening tests for certain conditions. This may include: Lipid and cholesterol levels. Hepatitis C test. Hepatitis B test. HIV (human immunodeficiency virus) test. STI (sexually transmitted infection) testing, if you are at risk. Lung cancer screening. Colorectal cancer screening. Diabetes screening. This is done by checking your blood sugar (glucose) after you have not eaten for a while (fasting). Mammogram. Talk with your health care provider about how often you should have regular mammograms. BRCA-related cancer screening. This may be done if you have a family history of breast, ovarian, tubal, or peritoneal cancers. Bone density scan. This is done to screen for osteoporosis. Talk with your health care provider about your test results, treatment options, and if necessary, the need for more tests. Follow these instructions at home: Eating and drinking  Eat a diet that includes fresh fruits and vegetables, whole grains, lean protein, and low-fat dairy products. Limit your intake  of foods with high amounts of sugar, saturated fats, and salt. Take vitamin and mineral supplements as recommended by your health care  provider. Do not drink alcohol if your health care provider tells you not to drink. If you drink alcohol: Limit how much you have to 0-1 drink a day. Know how much alcohol is in your drink. In the [place] one drink equals one 12 oz bottle of beer (355 mL), one 5 oz glass of wine (148 mL), or one 1 oz glass of hard liquor (44 mL). Lifestyle Brush your teeth every morning and night with fluoride toothpaste. Floss one time each day. Exercise for at least 30 minutes 5 or more days each week. Do not use any products that contain nicotine or tobacco. These products include cigarettes, chewing tobacco, and vaping devices, such as e-cigarettes. If you need help quitting, ask your health care provider. Do not use drugs. If you are sexually active, practice safe sex. Use a condom or other form of protection in order to prevent STIs. Take aspirin only as told by your health care provider. Make sure that you understand how much to take and what form to take. Work with your health care provider to find out whether it is safe and beneficial for you to take aspirin daily. Ask your health care provider if you need to take a cholesterol-lowering medicine (statin). Find healthy ways to manage stress, such as: Meditation, yoga, or listening to music. Journaling. Talking to a trusted person. Spending time with friends and family. Minimize exposure to UV radiation to reduce your risk of skin cancer. Safety Always wear your seat belt while driving or riding in a vehicle. Do not drive: If you have been drinking alcohol. Do not ride with someone who has been drinking. When you are tired or distracted. While texting. If you have been using any mind-altering substances or drugs. Wear a helmet and other protective equipment during sports activities. If you have firearms in your house, make sure you follow all gun safety procedures. What's next? Visit your health care provider once a year for an annual wellness  visit. Ask your health care provider how often you should have your eyes and teeth checked. Stay up to date on all vaccines. This information is not intended to replace advice given to you by your health care provider. Make sure you discuss any questions you have with your health care provider. Document Revised: 11/04/2020 Document Reviewed: 11/04/2020 Elsevier Patient Education  2024 [employer]. Understanding Your Risk for Falls Millions of people have serious injuries from falls each year. It is important to understand your risk of falling. Talk with your health care provider about your risk and what you can do to lower it. If you do have a serious fall, make sure to tell your provider. Falling once raises your risk of falling again. How can falls affect me? Serious injuries from falls are common. These include: Broken bones, such as hip fractures. Head injuries, such as traumatic brain injuries (TBI) or concussions. A fear of falling can cause you to avoid activities and stay at home. This can make your muscles weaker and raise your risk for a fall. What can increase my risk? There are a number of risk factors that increase your risk for falling. The more risk factors you have, the higher your risk of falling. Serious injuries from a fall happen most often to people who are older than 85 years old. Teenagers and young adults ages 13-29 are  also at higher risk. Common risk factors include: Weakness in the lower body. Being generally weak or confused due to long-term (chronic) illness. Dizziness or balance problems. Poor vision. Medicines that cause dizziness or drowsiness. These may include: Medicines for your blood pressure, heart, anxiety, insomnia, or swelling (edema). Pain medicines. Muscle relaxants. Other risk factors include: Drinking alcohol. Having had a fall in the past. Having foot pain or wearing improper footwear. Working at a dangerous job. Having any of the following  in your home: Tripping hazards, such as floor clutter or loose rugs. Poor lighting. Pets. Having dementia or memory loss. What actions can I take to lower my risk of falling?     Physical activity Stay physically fit. Do strength and balance exercises. Consider taking a regular class to build strength and balance. Yoga and tai chi are good options. Vision Have your eyes checked every year and your prescription for glasses or contacts updated as needed. Shoes and walking aids Wear non-skid shoes. Wear shoes that have rubber soles and low heels. Do not wear high heels. Do not walk around the house in socks or slippers. Use a cane or walker as told by your provider. Home safety Attach secure railings on both sides of your stairs. Install grab bars for your bathtub, shower, and toilet. Use a non-skid mat in your bathtub or shower. Attach bath mats securely with double-sided, non-slip rug tape. Use good lighting in all rooms. Keep a flashlight near your bed. Make sure there is a clear path from your bed to the bathroom. Use night-lights. Do not use throw rugs. Make sure all carpeting is taped or tacked down securely. Remove all clutter from walkways and stairways, including extension cords. Repair uneven or broken steps and floors. Avoid walking on icy or slippery surfaces. Walk on the grass instead of on icy or slick sidewalks. Use ice melter to get rid of ice on walkways in the winter. Use a cordless phone. Questions to ask your health care provider Can you help me check my risk for a fall? Do any of my medicines make me more likely to fall? Should I take a vitamin D supplement? What exercises can I do to improve my strength and balance? Should I make an appointment to have my vision checked? Do I need a bone density test to check for weak bones (osteoporosis)? Would it help to use a cane or a walker? Where to find more information Centers for Disease Control and Prevention,  STEAD[employer]: TonerPromos.no Community-Based Fall Prevention Programs: TonerPromos.no [employer] on Aging: BaseRingTones.pl Contact a health care provider if: You fall at home. You are afraid of falling at home. You feel weak, drowsy, or dizzy. This information is not intended to replace advice given to you by your health care provider. Make sure you discuss any questions you have with your health care provider. Document Revised: 01/10/2022 Document Reviewed: 01/10/2022 Elsevier Patient Education  2024 [employer].

## 2023-03-05 ENCOUNTER — Encounter: Payer: Self-pay | Admitting: Emergency Medicine

## 2023-03-05 ENCOUNTER — Ambulatory Visit
Admission: EM | Admit: 2023-03-05 | Discharge: 2023-03-05 | Disposition: A | Discharge status: Home / Self Care | Payer: Medicare HMO | Attending: Family Medicine | Admitting: Family Medicine

## 2023-03-05 DIAGNOSIS — J069 Acute upper respiratory infection, unspecified: Secondary | ICD-10-CM

## 2023-03-05 DIAGNOSIS — C9110 Chronic lymphocytic leukemia of B-cell type not having achieved remission: Secondary | ICD-10-CM | POA: Diagnosis not present

## 2023-03-05 DIAGNOSIS — J441 Chronic obstructive pulmonary disease with (acute) exacerbation: Secondary | ICD-10-CM

## 2023-03-05 DIAGNOSIS — Z1152 Encounter for screening for COVID-19: Secondary | ICD-10-CM | POA: Diagnosis not present

## 2023-03-05 MED ORDER — FLUTICASONE PROPIONATE 50 MCG/ACT NA SUSP: 1.0000 | Freq: Two times a day (BID) | NASAL | 2 refills | Status: DC

## 2023-03-05 MED ORDER — BENZONATATE 100 MG PO CAPS: 100.0000 mg | ORAL_CAPSULE | Freq: Three times a day (TID) | ORAL | 0 refills | Status: DC

## 2023-03-05 NOTE — ED Triage Notes (Signed)
Headache, sore throat and productive cough since Friday.  Has been taking mucinex and tylenol with little relief.

## 2023-03-05 NOTE — ED Provider Notes (Signed)
[facility]    CSN: [id number] Arrival date & time: 03/05/23  1232      History   Chief Complaint No chief complaint on file.   HPI [name] is a 87 y.o. female.   Patient presenting today with 3-day history of headache, sore throat, cough, wheezing.  Denies chest pain, shortness of breath, abdominal pain, nausea vomiting or diarrhea.  History of COPD on albuterol inhaler and neb solution which she has been using with some relief of symptoms.  Also using Mucinex DM with mild relief.    Past Medical History:  Diagnosis Date   Bowel obstruction (HCC)    Breast cancer (HCC)    Breast cancer, stage 3 (HCC) 07/21/2011   Bronchitis    CAD (coronary artery disease)    a. 06/2020 Cath: LM nl, LAD 15m, LCX min irregs, RCA mild diff dzs. PA 34/15(21), PCWP 15. CO 5.0 L/min. EF 25-35%, glob HK.   Chronic heart failure with preserved ejection fraction (HFpEF) (HCC)    a. 06/2018 Echo: EF 55-60%; b. 03/2019 Echo: EF 40-45%; c. 05/2020 Echo: EF 35-40%; d. 12/2020 Echo: EF 35-40%; e. 03/2021 Echo: EF 35-40%. mod LVH,GrI DD, nl RV fxn, mod dil LA/RA, mild MR/AS.   CLL (chronic lymphocytic leukemia) (HCC) 07/21/2011   Migraines    NICM (nonischemic cardiomyopathy) (HCC)    PAF (paroxysmal atrial fibrillation) (HCC)    a. Dx 06/2018 in setting of COPD flare-->bb/eliquis (CHA2DS2VASc = 6); b. 02/2020 Zio: Predominantly sinus rhythm at an average heart rate of 60 (39-190).  3% atrial fibrillation burden and average of 127 (59-1 and 37), longest lasting 9 hours and 23 minutes.  43 runs of PSVT noted.  3 runs of nonsustained VT noted.   Pneumonia    PSVT (paroxysmal supraventricular tachycardia) (HCC)    a. 02/2020 Zio: 43 rounds of symptomatic PSVT.  Longest 19 beats; fastest 164 bpm x 14 beats.    Patient Active Problem List   Diagnosis Date Noted   Hot flashes 01/09/2023   Vitamin D deficiency 01/09/2023   Closed compression fracture of L4 vertebra (HCC) 01/09/2023   CAP  (community acquired pneumonia) 05/23/2022   Leukocytosis 05/23/2022   CAD (coronary artery disease) 05/23/2022   GERD without esophagitis 05/23/2022   Acute respiratory failure with hypoxia (HCC) 04/07/2021   Chronic combined systolic and diastolic CHF (congestive heart failure) (HCC) 04/07/2021   Dyspnea on exertion    Acute on chronic HFrEF (heart failure with reduced ejection fraction) (HCC) 07/09/2020   Abnormal stress test 07/09/2020   Secondary hypercoagulable state (HCC) 03/23/2020   Acute combined systolic and diastolic (congestive) hrt fail (HCC) 03/27/2019   New onset of congestive heart failure (HCC) 03/25/2019   Paroxysmal atrial fibrillation (HCC)    COPD with chronic bronchitis (HCC)    Unilateral primary osteoarthritis, left knee 10/24/2018   COPD with acute exacerbation (HCC) 06/27/2018   Atrial fibrillation with RVR (HCC) 06/26/2018   SBO (small bowel obstruction) (HCC) 06/05/2015   Hyperglycemia 06/05/2015   Mass of arm 11/15/2011   CLL (chronic lymphocytic leukemia) (HCC) 07/21/2011   Breast cancer, stage 3 (HCC) 07/21/2011   ARTHRITIS, RIGHT HIP 09/23/2008   Mixed hyperlipidemia 04/25/2008   OSA (obstructive sleep apnea) 01/16/2008   FATIGUE 09/14/2007   ANXIETY 01/08/2007   Depression 01/08/2007   ALLERGIC RHINITIS 01/08/2007   ARTHRITIS 01/08/2007   HIP PAIN, RIGHT 01/08/2007   LOW BACK PAIN 01/08/2007   OSTEOPENIA 01/08/2007   MIGRAINES, HX OF 01/08/2007  Past Surgical History:  Procedure Laterality Date   ABDOMINAL HYSTERECTOMY     APPENDECTOMY     CATARACT EXTRACTION Bilateral 2023   CHOLECYSTECTOMY     LAPAROTOMY N/A 06/08/2015   Procedure: EXPLORATORY LAPAROTOMY;  Surgeon: Franky Macho, MD;  Location: [facility];  Service: General;  Laterality: N/A;   LYSIS OF ADHESION N/A 06/08/2015   Procedure: LYSIS OF ADHESION;  Surgeon: Franky Macho, MD;  Location: [facility];  Service: General;  Laterality: N/A;   MASTECTOMY     right   RIGHT/LEFT HEART CATH  AND CORONARY ANGIOGRAPHY N/A 07/09/2020   Procedure: RIGHT/LEFT HEART CATH AND CORONARY ANGIOGRAPHY;  Surgeon: Yvonne Kendall, MD;  Location: [facility];  Service: Cardiovascular;  Laterality: N/A;   TOTAL HIP ARTHROPLASTY     right    OB History   No obstetric history on file.      Home Medications    Prior to Admission medications   Medication Sig Start Date End Date Taking? Authorizing Provider  benzonatate (TESSALON) 100 MG capsule Take 1 capsule (100 mg total) by mouth every 8 (eight) hours. 03/05/23  Yes Particia Nearing, PA-C  fluticasone [facility]) 50 MCG/ACT nasal spray Place 1 spray into both nostrils 2 (two) times daily. 03/05/23  Yes Particia Nearing, PA-C  acetaminophen (TYLENOL) 500 MG tablet Take 1,000 mg by mouth every 6 (six) hours as needed for moderate pain or headache.    [provider]  albuterol (PROVENTIL HFA;VENTOLIN HFA) 108 (90 BASE) MCG/ACT inhaler Inhale 2 puffs into the lungs every 6 (six) hours as needed for shortness of breath.    [provider]  albuterol (PROVENTIL) (2.5 MG/3ML) 0.083% nebulizer solution Take 3 mLs (2.5 mg total) by nebulization every 6 (six) hours as needed for wheezing or shortness of breath (when not relieved by inhaler.). 06/27/18   Vassie Loll, MD  apixaban (ELIQUIS) 5 MG TABS tablet TAKE 1 TABLET BY MOUTH TWICE A DAY 01/16/23   Pricilla Riffle, MD  atorvastatin (LIPITOR) 20 MG tablet Take 1 tablet (20 mg total) by mouth daily. 07/28/22   Creig Hines, NP  bisoprolol (ZEBETA) 5 MG tablet TAKE (1/2) TABLET BY MOUTH ONCE DAILY. 09/29/22   Pricilla Riffle, MD  cholecalciferol (VITAMIN D3) 25 MCG (1000 UNIT) tablet Take 1 capsule by mouth daily. 04/27/21   Doreatha Massed, MD  diphenhydrAMINE (BENADRYL) 25 MG tablet Take 25 mg by mouth daily as needed for allergies.    [provider]  losartan (COZAAR) 25 MG tablet TAKE (1/2) TABLET BY MOUTH DAILY 07/28/22   Creig Hines, NP  Misc. 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 Dx: Z61.096 11/09/22   Doreatha Massed, MD  omeprazole (PRILOSEC) 20 MG capsule Take 20 mg by mouth daily.    [provider]  ondansetron (ZOFRAN) 4 MG tablet Take 1 tablet (4 mg total) by mouth every 8 (eight) hours as needed for nausea or vomiting. 05/13/21   [name], Lennart Pall, PA-C  spironolactone (ALDACTONE) 25 MG tablet TAKE (1) TABLET BY MOUTH DAILY. 01/30/23   Pricilla Riffle, MD  VEOZAH 45 MG TABS Take 1 tablet (45 mg total) by mouth daily. 02/10/23   Anabel Halon, MD    Family History Family History  Problem Relation Age of Onset   Cancer Brother    Cancer Other     Social History Social History   Tobacco Use   Smoking status: Former  Current packs/day: 0.00    Types: Cigarettes    Quit date: 10/26/1978    Years since quitting: 44.3   Smokeless tobacco: Never  Vaping Use   Vaping status: Never Used  Substance Use Topics   Alcohol use: No   Drug use: No     Allergies   Codeine, Meclizine, Sulfa antibiotics, Entresto [sacubitril-valsartan], Jardiance [empagliflozin], and Zoloft [sertraline hcl]   Review of Systems Review of Systems Per HPI  Physical Exam Triage Vital Signs ED Triage Vitals  Encounter Vitals Group     BP 03/05/23 1242 110/68     Systolic BP Percentile --      Diastolic BP Percentile --      Pulse Rate 03/05/23 1237 73     Resp 03/05/23 1237 20     Temp 03/05/23 1237 98.3 F (36.8 C)     Temp Source 03/05/23 1237 Oral     SpO2 03/05/23 1237 95 %     Weight --      Height --      Head Circumference --      Peak Flow --      Pain Score 03/05/23 1242 7     Pain Loc --      Pain Education --      Exclude from Growth Chart --    No data found.  Updated Vital Signs BP 110/68 (BP Location: Left Arm)   Pulse 73   Temp 98.3 F (36.8 C) (Oral)   Resp 20   SpO2 95%   Visual Acuity Right Eye Distance:   Left Eye Distance:    Bilateral Distance:    Right Eye Near:   Left Eye Near:    Bilateral Near:     Physical Exam Vitals and nursing note reviewed.  Constitutional:      Appearance: Normal appearance.  HENT:     Head: Atraumatic.     Right Ear: Tympanic membrane and external ear normal.     Left Ear: Tympanic membrane and external ear normal.     Nose: Rhinorrhea present.     Mouth/Throat:     Mouth: Mucous membranes are moist.     Pharynx: Posterior oropharyngeal erythema present.  Eyes:     Extraocular Movements: Extraocular movements intact.     Conjunctiva/sclera: Conjunctivae normal.  Cardiovascular:     Rate and Rhythm: Normal rate and regular rhythm.     Heart sounds: Normal heart sounds.  Pulmonary:     Effort: Pulmonary effort is normal.     Breath sounds: Normal breath sounds. No wheezing or rales.  Musculoskeletal:        General: Normal range of motion.     Cervical back: Normal range of motion and neck supple.  Skin:    General: Skin is warm and dry.  Neurological:     Mental Status: She is alert and oriented to person, place, and time.  Psychiatric:        Mood and Affect: Mood normal.        Thought Content: Thought content normal.      UC Treatments / Results  Labs (all labs ordered are listed, but only abnormal results are displayed) Labs Reviewed  SARS CORONAVIRUS 2 (TAT 6-24 HRS)    EKG   Radiology No results found.  Procedures Procedures (including critical care time)  Medications Ordered in UC Medications - No data to display  Initial Impression / Assessment and Plan / UC Course  I have reviewed the  triage vital signs and the nursing notes.  Pertinent labs & imaging results that were available during my care of the patient were reviewed by me and considered in my medical decision making (see chart for details).     Vitals and exam overall reassuring today, COVID PCR pending.  Good candidate for molnupiravir if positive for COVID.  Continue  albuterol inhaler and neb solution for control of COPD exacerbation from viral respiratory infection.  Tessalon, Flonase, supportive over-the-counter medications and home care additionally.  Return for worsening symptoms.  Final Clinical Impressions(s) / UC Diagnoses   Final diagnoses:  Viral URI with cough  COPD exacerbation (HCC)     Discharge Instructions      COVID results should be back tomorrow and someone will call if this is positive to discuss antiviral medications.  I have sent over something for cough and nasal congestion and you may also use Coricidin HBP, plain Mucinex, saline sinus rinses to help additionally.  Use your inhaler and nebulizer treatments as needed for cough and wheezing.  Tylenol as needed for headache.    ED Prescriptions     Medication Sig Dispense Auth. Provider   benzonatate (TESSALON) 100 MG capsule Take 1 capsule (100 mg total) by mouth every 8 (eight) hours. 21 capsule Particia Nearing, PA-C   fluticasone [facility]) 50 MCG/ACT nasal spray Place 1 spray into both nostrils 2 (two) times daily. 16 g Particia Nearing, [place]      PDMP not reviewed this encounter.   Particia Nearing, [place] 03/05/23 1345

## 2023-03-05 NOTE — Discharge Instructions (Signed)
COVID results should be back tomorrow and someone will call if this is positive to discuss antiviral medications.  I have sent over something for cough and nasal congestion and you may also use Coricidin HBP, plain Mucinex, saline sinus rinses to help additionally.  Use your inhaler and nebulizer treatments as needed for cough and wheezing.  Tylenol as needed for headache.

## 2023-03-06 LAB — SARS CORONAVIRUS 2 (TAT 6-24 HRS): SARS Coronavirus 2: NEGATIVE

## 2023-03-11 ENCOUNTER — Emergency Department (HOSPITAL_COMMUNITY)
Admission: EM | Admit: 2023-03-11 | Discharge: 2023-03-11 | Disposition: A | Discharge status: Home / Self Care | Payer: Medicare HMO | Attending: Emergency Medicine | Admitting: Emergency Medicine

## 2023-03-11 ENCOUNTER — Emergency Department (HOSPITAL_COMMUNITY): Payer: Medicare HMO

## 2023-03-11 ENCOUNTER — Other Ambulatory Visit: Payer: Self-pay

## 2023-03-11 ENCOUNTER — Encounter (HOSPITAL_COMMUNITY): Payer: Self-pay | Admitting: Emergency Medicine

## 2023-03-11 DIAGNOSIS — Z7901 Long term (current) use of anticoagulants: Secondary | ICD-10-CM | POA: Insufficient documentation

## 2023-03-11 DIAGNOSIS — R0989 Other specified symptoms and signs involving the circulatory and respiratory systems: Secondary | ICD-10-CM | POA: Diagnosis not present

## 2023-03-11 DIAGNOSIS — R519 Headache, unspecified: Secondary | ICD-10-CM | POA: Diagnosis present

## 2023-03-11 DIAGNOSIS — Z7951 Long term (current) use of inhaled steroids: Secondary | ICD-10-CM | POA: Insufficient documentation

## 2023-03-11 DIAGNOSIS — Z20822 Contact with and (suspected) exposure to covid-19: Secondary | ICD-10-CM | POA: Insufficient documentation

## 2023-03-11 DIAGNOSIS — R0602 Shortness of breath: Secondary | ICD-10-CM | POA: Diagnosis not present

## 2023-03-11 DIAGNOSIS — R059 Cough, unspecified: Secondary | ICD-10-CM | POA: Diagnosis not present

## 2023-03-11 DIAGNOSIS — I517 Cardiomegaly: Secondary | ICD-10-CM | POA: Diagnosis not present

## 2023-03-11 DIAGNOSIS — J189 Pneumonia, unspecified organism: Secondary | ICD-10-CM | POA: Diagnosis not present

## 2023-03-11 LAB — BASIC METABOLIC PANEL
Anion gap: 12 (ref 5–15)
BUN: 20 mg/dL (ref 8–23)
CO2: 20 mmol/L — ABNORMAL LOW (ref 22–32)
Calcium: 8.7 mg/dL — ABNORMAL LOW (ref 8.9–10.3)
Chloride: 105 mmol/L (ref 98–111)
Creatinine, Ser: 0.99 mg/dL (ref 0.44–1.00)
GFR, Estimated: 56 mL/min — ABNORMAL LOW (ref >60)
Glucose, Bld: 171 mg/dL — ABNORMAL HIGH (ref 70–99)
Potassium: 3.7 mmol/L (ref 3.5–5.1)
Sodium: 137 mmol/L (ref 135–145)

## 2023-03-11 LAB — CBC
HCT: 39 % (ref 36.0–46.0)
Hemoglobin: 12.3 g/dL (ref 12.0–15.0)
MCH: 30.2 pg (ref 26.0–34.0)
MCHC: 31.5 g/dL (ref 30.0–36.0)
MCV: 95.8 fL (ref 80.0–100.0)
Platelets: 368 10*3/uL (ref 150–400)
RBC: 4.07 MIL/uL (ref 3.87–5.11)
RDW: 13.6 % (ref 11.5–15.5)
WBC: 39 10*3/uL — ABNORMAL HIGH (ref 4.0–10.5)
nRBC: 0 % (ref 0.0–0.2)

## 2023-03-11 LAB — SARS CORONAVIRUS 2 BY RT PCR: SARS Coronavirus 2 by RT PCR: NEGATIVE

## 2023-03-11 MED ORDER — AMOXICILLIN-POT CLAVULANATE 875-125 MG PO TABS
1.0000 | ORAL_TABLET | Freq: Once | ORAL | Status: AC
  Administered 2023-03-11: 1 via ORAL
  Filled 2023-03-11: qty 1

## 2023-03-11 MED ORDER — AMOXICILLIN-POT CLAVULANATE 875-125 MG PO TABS: 1.0000 | ORAL_TABLET | Freq: Two times a day (BID) | ORAL | 0 refills | Status: DC

## 2023-03-11 MED ORDER — AZITHROMYCIN 250 MG PO TABS: 250.0000 mg | ORAL_TABLET | Freq: Every day | ORAL | 0 refills | Status: DC

## 2023-03-11 MED ORDER — METOPROLOL TARTRATE 25 MG PO TABS
25.0000 mg | ORAL_TABLET | Freq: Once | ORAL | Status: AC
  Administered 2023-03-11: 25 mg via ORAL
  Filled 2023-03-11: qty 1

## 2023-03-11 NOTE — ED Provider Notes (Signed)
[place] EMERGENCY DEPARTMENT AT [facility] Provider Note   CSN: [id number] Arrival date & time: 03/11/23  1935     History  Chief Complaint  Patient presents with   Nasal Congestion   Headache    [name] is a 87 y.o. female.   Headache  This patient is an 87 year old female, she presents with a complaint of shortness of breath coughing runny nose and severe weakness.  This patient reports that she does have a history of atrial fibrillation which is chronic and for which she takes Eliquis and a beta-blocker in the form of bisoprolol.  She did miss her Eliquis today.  About a week ago she developed a feeling of coughing and shortness of breath and for which she was seen in the urgent care, she had a COVID test which she reportedly was negative, she was given a prescription for benzonatate and Flonase nasal spray and asked to take Coricidin high blood pressure medication to help with her overall symptoms.  Over the week she has had progressive shortness of breath and presents today stating that she is even having difficulty walking across her kitchen because of the increasing shortness of breath.  No swelling of the legs, no fevers or chills, she has been nauseated but not having any vomiting or diarrhea.  She still has productive phlegm.    Home Medications Prior to Admission medications   Medication Sig Start Date End Date Taking? Authorizing Provider  amoxicillin-clavulanate (AUGMENTIN) 875-125 MG tablet Take 1 tablet by mouth every 12 (twelve) hours. 03/11/23  Yes Eber Hong, MD  azithromycin (ZITHROMAX Z-PAK) 250 MG tablet Take 1 tablet (250 mg total) by mouth daily. 500mg  PO day 1, then 250mg  PO days 205 03/11/23  Yes Eber Hong, MD  acetaminophen (TYLENOL) 500 MG tablet Take 1,000 mg by mouth every 6 (six) hours as needed for moderate pain or headache.    [provider]  albuterol (PROVENTIL HFA;VENTOLIN HFA) 108 (90 BASE) MCG/ACT inhaler  Inhale 2 puffs into the lungs every 6 (six) hours as needed for shortness of breath.    [provider]  albuterol (PROVENTIL) (2.5 MG/3ML) 0.083% nebulizer solution Take 3 mLs (2.5 mg total) by nebulization every 6 (six) hours as needed for wheezing or shortness of breath (when not relieved by inhaler.). 06/27/18   Vassie Loll, MD  apixaban (ELIQUIS) 5 MG TABS tablet TAKE 1 TABLET BY MOUTH TWICE A DAY 01/16/23   Pricilla Riffle, MD  atorvastatin (LIPITOR) 20 MG tablet Take 1 tablet (20 mg total) by mouth daily. 07/28/22   Creig Hines, NP  benzonatate (TESSALON) 100 MG capsule Take 1 capsule (100 mg total) by mouth every 8 (eight) hours. 03/05/23   Particia Nearing, PA-C  bisoprolol (ZEBETA) 5 MG tablet TAKE (1/2) TABLET BY MOUTH ONCE DAILY. 09/29/22   Pricilla Riffle, MD  cholecalciferol (VITAMIN D3) 25 MCG (1000 UNIT) tablet Take 1 capsule by mouth daily. 04/27/21   Doreatha Massed, MD  diphenhydrAMINE (BENADRYL) 25 MG tablet Take 25 mg by mouth daily as needed for allergies.    [provider]  fluticasone (FLONASE) 50 MCG/ACT nasal spray Place 1 spray into both nostrils 2 (two) times daily. 03/05/23   Particia Nearing, PA-C  losartan (COZAAR) 25 MG tablet TAKE (1/2) TABLET BY MOUTH DAILY 07/28/22   Creig Hines, NP  Misc. 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 Dx: W09.811 11/09/22   Doreatha Massed,  MD  omeprazole (PRILOSEC) 20 MG capsule Take 20 mg by mouth daily.    [provider]  ondansetron (ZOFRAN) 4 MG tablet Take 1 tablet (4 mg total) by mouth every 8 (eight) hours as needed for nausea or vomiting. 05/13/21   [name], Lennart Pall, PA-C  spironolactone (ALDACTONE) 25 MG tablet TAKE (1) TABLET BY MOUTH DAILY. 01/30/23   Pricilla Riffle, MD  VEOZAH 45 MG TABS Take 1 tablet (45 mg total) by mouth daily. 02/10/23   Anabel Halon, MD      Allergies    Codeine, Meclizine, Sulfa antibiotics,  Entresto [sacubitril-valsartan], Jardiance [empagliflozin], and Zoloft [sertraline hcl]    Review of Systems   Review of Systems  Neurological:  Positive for headaches.  All other systems reviewed and are negative.   Physical Exam Updated Vital Signs BP 100/73 (BP Location: Left Arm)   Pulse (!) 116   Temp 98.3 F (36.8 C) (Oral)   Resp (!) 24   Ht 1.549 m (5\' 1" )   Wt 68.9 kg   SpO2 98%   BMI 28.72 kg/m  Physical Exam Vitals and nursing note reviewed.  Constitutional:      General: She is not in acute distress.    Appearance: She is well-developed.  HENT:     Head: Normocephalic and atraumatic.     Mouth/Throat:     Mouth: Mucous membranes are dry.     Pharynx: No oropharyngeal exudate.  Eyes:     General: No scleral icterus.       Right eye: No discharge.        Left eye: No discharge.     Conjunctiva/sclera: Conjunctivae normal.     Pupils: Pupils are equal, round, and reactive to light.  Neck:     Thyroid: No thyromegaly.     Vascular: No JVD.  Cardiovascular:     Rate and Rhythm: Tachycardia present. Rhythm irregular.     Heart sounds: Normal heart sounds. No murmur heard.    No friction rub. No gallop.  Pulmonary:     Effort: Pulmonary effort is normal. No respiratory distress.     Breath sounds: Rales present. No wheezing.     Comments: Subtle rales at the bases bilaterally, speaks in full sentences, no increased work of breathing Abdominal:     General: Bowel sounds are normal. There is no distension.     Palpations: Abdomen is soft. There is no mass.     Tenderness: There is no abdominal tenderness.  Musculoskeletal:        General: No tenderness. Normal range of motion.     Cervical back: Normal range of motion and neck supple.     Right lower leg: No edema.     Left lower leg: No edema.  Lymphadenopathy:     Cervical: No cervical adenopathy.  Skin:    General: Skin is warm and dry.     Findings: No erythema or rash.  Neurological:     Mental  Status: She is alert.     Coordination: Coordination normal.  Psychiatric:        Behavior: Behavior normal.     ED Results / Procedures / Treatments   Labs (all labs ordered are listed, but only abnormal results are displayed) Labs Reviewed  CBC - Abnormal; Notable for the following components:      Result Value   WBC 39.0 (*)    All other components within normal limits  BASIC METABOLIC PANEL - Abnormal;  Notable for the following components:   CO2 20 (*)    Glucose, Bld 171 (*)    Calcium 8.7 (*)    GFR, Estimated 56 (*)    All other components within normal limits  SARS CORONAVIRUS 2 BY RT PCR    EKG EKG Interpretation Date/Time:  Saturday March 11 2023 19:53:16 EDT Ventricular Rate:  131 PR Interval:    QRS Duration:  124 QT Interval:  350 QTC Calculation: 516 R Axis:   -47  Text Interpretation: Atrial fibrillation with rapid ventricular response Left axis deviation Minimal voltage criteria for LVH, may be normal variant ( Cornell product ) Anterolateral infarct , age undetermined Abnormal ECG When compared with ECG of 13-May-2021 17:01, PREVIOUS ECG IS PRESENT since last tracing no significant change ST depression seen on prior Confirmed by Eber Hong ([id number]) on 03/11/2023 8:00:40 PM  Radiology No results found.  Procedures Procedures    Medications Ordered in ED Medications  amoxicillin-clavulanate (AUGMENTIN) 875-125 MG per tablet 1 tablet (has no administration in time range)  metoprolol tartrate (LOPRESSOR) tablet 25 mg (25 mg Oral Given 03/11/23 2034)    ED Course/ Medical Decision Making/ A&P                                 Medical Decision Making Amount and/or Complexity of Data Reviewed Labs: ordered. Radiology: ordered.  Risk Prescription drug management.   This patient is ill with what appears to be atrial fibrillation and she is in rapid ventricular rate with rates between 120 and 140.  She has some subtle rales which is suggestive of  either a viral or bacterial process and for which she will need an x-ray.  Labs will be ordered, the patient will be given some beta-blocker to help slow her heart down but she is borderline hypotensive with a blood pressure of 100 systolic at this time of evaluation.  She is afebrile and she is not hypoxic on room air.  She is agreeable to the workup  On repeat exam the patient's heart rate has come down to about 95, she is still in atrial fibrillation but is in atrial fibrillation chronically.  Her oxygen is 98%, she is in no distress, she was informed that I think that her x-ray shows a slight infiltrate at the base that could be consistent with pneumonia.  She has a well-established history of CLL so I am not surprised to see an elevated blood count.  I have asked her to follow-up with her doctor in 3 days, she is comfortable going home with antibiotics, she is stable at this time  She speaks in full sentences without tachypnea or respiratory distress, her heart rate is down below 100, she is in A-fib but is chronically according to her report        Final Clinical Impression(s) / ED Diagnoses Final diagnoses:  Community acquired pneumonia, unspecified laterality    Rx / DC Orders ED Discharge Orders          Ordered    azithromycin (ZITHROMAX Z-PAK) 250 MG tablet  Daily        03/11/23 2250    amoxicillin-clavulanate (AUGMENTIN) 875-125 MG tablet  Every 12 hours        03/11/23 2250              Eber Hong, MD 03/11/23 2252

## 2023-03-11 NOTE — ED Triage Notes (Signed)
Pt reports a continence of her headache, sore throat and productive cough despite taking the mediation the urgent care prescribed a week ago. She denies any fevers but last took tylenol two hours ago.  Hx of afib, heart rate is 129 and is compliant w/ meds.

## 2023-03-11 NOTE — Discharge Instructions (Signed)
Please take Augmentin, 1 tablet twice a day for the next 7 days, this is used to treat infections, it treats a variety of infections including animal bites, bacterial infections, and some gastrointestinal infections.   It is related to amoxicillin so do not take this medication if you are allergic to amoxicillin or penicillin.  effects of medications such as antibiotics include diarrhea which may occur as well as potentially inactivating birth control so if you are using a birth control pill please use an alternative form of birth control for the next 2 weeks.  There is occasions where this antibiotic does not work so if you are not improving within 48 hours you will need to be reevaluated immediately by your doctor or in the emergency department if your symptoms are worsening  Zithromax daily for 5 days  Thank you for allowing [place] to treat you in the emergency department today.  After reviewing your examination and potential testing that was done it appears that you are safe to go home.  I would like for you to follow-up with your doctor within the next several days, have them obtain your records and follow-up with them to review all potential tests and results from your visit.  If you should develop severe or worsening symptoms return to the emergency department immediately

## 2023-03-16 DIAGNOSIS — M1712 Unilateral primary osteoarthritis, left knee: Secondary | ICD-10-CM | POA: Diagnosis not present

## 2023-03-16 NOTE — Progress Notes (Signed)
Established Patient Office Visit   Subjective  Patient ID: [name], female    DOB: 03/30/1936  Age: 87 y.o. MRN: [id number]  Chief Complaint  Patient presents with   Follow-up    Released from ED on 10/19. Diagnosed w/ Pneumonia. Nasal congestion, headaches have cleared. Fell Sunday and is having pain on rt. side and back.,      She  has a past medical history of Bowel obstruction (HCC), Breast cancer (HCC), Breast cancer, stage 3 (HCC) (07/21/2011), Bronchitis, CAD (coronary artery disease), Chronic heart failure with preserved ejection fraction (HFpEF) (HCC), CLL (chronic lymphocytic leukemia) (HCC) (07/21/2011), Migraines, NICM (nonischemic cardiomyopathy) (HCC), PAF (paroxysmal atrial fibrillation) (HCC), Pneumonia, and PSVT (paroxysmal supraventricular tachycardia) (HCC).  The patient experienced a fall on Wednesday after losing her balance while standing in the bathroom, landing on a shower chair from an unknown height. The primary points of impact were her right hip and right side chest, though there was no blood loss. She reports pain localized to her right ribs and right hip, with some bruising and a healing hematoma. Her pain severity is noted as 7/10 but is described as mild overall. The pain is aggravated by pressure on the injury site and by extension movements. She also reports associated headaches. Pertinent negatives include no hematuria, loss of consciousness, numbness, or visual changes. The patient has tried acetaminophen, which has provided moderate symptom relief.     Review of Systems  Respiratory:  Negative for cough, hemoptysis, sputum production, shortness of breath and wheezing.   Cardiovascular:  Negative for chest pain.  Genitourinary:  Negative for dysuria.  Musculoskeletal:  Positive for joint pain and myalgias.  Neurological:  Positive for headaches.      Objective:     BP 98/62   Pulse (!) 52   Ht 5\' 1"  (1.549 m)   Wt 153 lb 1.3 oz (69.4  kg)   SpO2 95%   BMI 28.92 kg/m  BP Readings from Last 3 Encounters:  03/17/23 98/62  03/11/23 107/78  03/05/23 110/68      Physical Exam Vitals reviewed.  Constitutional:      General: She is not in acute distress.    Appearance: Normal appearance. She is not ill-appearing, toxic-appearing or diaphoretic.  HENT:     Head: Normocephalic.  Eyes:     General:        Right eye: No discharge.        Left eye: No discharge.     Conjunctiva/sclera: Conjunctivae normal.  Cardiovascular:     Rate and Rhythm: Normal rate.     Pulses: Normal pulses.     Heart sounds: Normal heart sounds.  Pulmonary:     Effort: Pulmonary effort is normal. No respiratory distress.     Breath sounds: Normal breath sounds.  Abdominal:     General: Bowel sounds are normal.     Palpations: Abdomen is soft.     Tenderness: There is no abdominal tenderness. There is no right CVA tenderness, left CVA tenderness or guarding.  Musculoskeletal:     Cervical back: Normal range of motion.  Skin:    General: Skin is warm and dry.     Capillary Refill: Capillary refill takes less than 2 seconds.  Neurological:     Mental Status: She is alert and oriented to person, place, and time.  Psychiatric:        Mood and Affect: Mood normal.        Behavior: Behavior normal.  No results found for any visits on 03/17/23.  The ASCVD Risk score (Arnett DK, et al., 2019) failed to calculate for the following reasons:   The 2019 ASCVD risk score is only valid for ages 18 to 40    Assessment & Plan:  Rib pain on right side -     methylPREDNISolone Acetate  Fall at home, initial encounter Assessment & Plan:  Healing hematoma over the right hip and rib area, presenting as a fading area of discoloration with residual bruising. The hematoma shows signs of gradual resolution, with reduced tenderness and no signs of infection or further swelling. Skin over the affected area appears intact, with no active bleeding or  drainage.  Depo-Medrol IM 40 mg injection given   Advise patient to follow up for worsening symptoms      Return if symptoms worsen or fail to improve.   Cruzita Lederer Newman Nip, FNP

## 2023-03-16 NOTE — Patient Instructions (Addendum)

## 2023-03-17 ENCOUNTER — Encounter: Payer: Self-pay | Admitting: Family Medicine

## 2023-03-17 ENCOUNTER — Ambulatory Visit (INDEPENDENT_AMBULATORY_CARE_PROVIDER_SITE_OTHER): Payer: Medicare HMO | Admitting: Family Medicine

## 2023-03-17 VITALS — BP 98/62 | HR 52 | Ht 61.0 in | Wt 153.1 lb

## 2023-03-17 DIAGNOSIS — W19XXXA Unspecified fall, initial encounter: Secondary | ICD-10-CM | POA: Insufficient documentation

## 2023-03-17 DIAGNOSIS — R0781 Pleurodynia: Secondary | ICD-10-CM | POA: Diagnosis not present

## 2023-03-17 DIAGNOSIS — Y92009 Unspecified place in unspecified non-institutional (private) residence as the place of occurrence of the external cause: Secondary | ICD-10-CM

## 2023-03-17 MED ORDER — METHYLPREDNISOLONE ACETATE 80 MG/ML IJ SUSP
40.0000 mg | Freq: Once | INTRAMUSCULAR | Status: AC
  Administered 2023-03-17: 40 mg via INTRAMUSCULAR

## 2023-03-17 MED ORDER — METHYLPREDNISOLONE ACETATE 40 MG/ML IJ SUSP: 40.0000 mg | Freq: Once | INTRAMUSCULAR | Status: DC

## 2023-03-17 NOTE — Assessment & Plan Note (Signed)
Healing hematoma over the right hip and rib area, presenting as a fading area of discoloration with residual bruising. The hematoma shows signs of gradual resolution, with reduced tenderness and no signs of infection or further swelling. Skin over the affected area appears intact, with no active bleeding or drainage.  Depo-Medrol IM 40 mg injection given   Advise patient to follow up for worsening symptoms

## 2023-03-31 ENCOUNTER — Ambulatory Visit (INDEPENDENT_AMBULATORY_CARE_PROVIDER_SITE_OTHER): Payer: Medicare HMO | Admitting: Internal Medicine

## 2023-03-31 ENCOUNTER — Encounter: Payer: Self-pay | Admitting: Internal Medicine

## 2023-03-31 VITALS — BP 100/63 | HR 63 | Resp 16 | Ht 61.0 in | Wt 154.2 lb

## 2023-03-31 DIAGNOSIS — Z23 Encounter for immunization: Secondary | ICD-10-CM | POA: Insufficient documentation

## 2023-03-31 DIAGNOSIS — R0781 Pleurodynia: Secondary | ICD-10-CM | POA: Diagnosis not present

## 2023-03-31 NOTE — Assessment & Plan Note (Signed)
Influenza vaccine administered today.

## 2023-03-31 NOTE — Assessment & Plan Note (Signed)
She fell while getting in her shower 3 weeks ago, hitting her right side on the shower door.  She has experienced pain in her right ribs since that time.  Evaluated for an acute visit on 10/25.  She has been managing pain with as needed use of Tylenol.  Ecchymoses is present over the right ribs.  She reports that pain is gradually improving.  Recommend continued use of Tylenol as needed for pain relief.

## 2023-03-31 NOTE — Patient Instructions (Signed)
It was a pleasure to see you today.  Thank you for giving [place] the opportunity to be involved in your care.  Below is a brief recap of your visit and next steps.  We will plan to see you again in 3 months.  Summary No medication changes today Continue as needed use of tylenol for pain relief Follow up in 3 months

## 2023-03-31 NOTE — Progress Notes (Signed)
Established Patient Office Visit  Subjective   Patient ID: [name], female    DOB: [birth year]  Age: 87 y.o. MRN: [id number]  Chief Complaint  Patient presents with   [name] last Wednesday into her walk in shower but the shower chair kept her from hitting the wall. The chair hit her in her right rib area and the pain is awful. Has been taking tylenol for the pain    [name] returns to care today for routine follow-up.  She was last evaluated by me on 8/7 as a new patient presenting to establish care.  No medication changes were made at that time and 40-month follow-up was arranged.  In the interim, she has been evaluated by cardiology and orthopedic surgery for follow-up.  ED presentations on 10/13 and 10/19 endorsing URI symptoms and headache.  She was evaluated at [facility] for an acute visit on 10/25 in the setting of a fall with right hip and rib pain.  There have otherwise been no acute interval events.  [name] reports feeling fairly well today.  She continues to endorse right sided rib pain but feels that pain is gradually improving.  Range of motion in her right shoulder is improving as well.  She does not have any acute concerns to discuss today.  Past Medical History:  Diagnosis Date   Bowel obstruction (HCC)    Breast cancer (HCC)    Breast cancer, stage 3 (HCC) 07/21/2011   Bronchitis    CAD (coronary artery disease)    a. 06/2020 Cath: LM nl, LAD 54m, LCX min irregs, RCA mild diff dzs. PA 34/15(21), PCWP 15. CO 5.0 L/min. EF 25-35%, glob HK.   Chronic heart failure with preserved ejection fraction (HFpEF) (HCC)    a. 06/2018 Echo: EF 55-60%; b. 03/2019 Echo: EF 40-45%; c. 05/2020 Echo: EF 35-40%; d. 12/2020 Echo: EF 35-40%; e. 03/2021 Echo: EF 35-40%. mod LVH,GrI DD, nl RV fxn, mod dil LA/RA, mild MR/AS.   CLL (chronic lymphocytic leukemia) (HCC) 07/21/2011   Migraines    NICM (nonischemic cardiomyopathy) (HCC)    PAF (paroxysmal atrial fibrillation) (HCC)     a. Dx 06/2018 in setting of COPD flare-->bb/eliquis (CHA2DS2VASc = 6); b. 02/2020 Zio: Predominantly sinus rhythm at an average heart rate of 60 (39-190).  3% atrial fibrillation burden and average of 127 (59-1 and 37), longest lasting 9 hours and 23 minutes.  43 runs of PSVT noted.  3 runs of nonsustained VT noted.   Pneumonia    PSVT (paroxysmal supraventricular tachycardia) (HCC)    a. 02/2020 Zio: 43 rounds of symptomatic PSVT.  Longest 19 beats; fastest 164 bpm x 14 beats.   Past Surgical History:  Procedure Laterality Date   ABDOMINAL HYSTERECTOMY     APPENDECTOMY     CATARACT EXTRACTION Bilateral 2023   CHOLECYSTECTOMY     LAPAROTOMY N/A 06/08/2015   Procedure: EXPLORATORY LAPAROTOMY;  Surgeon: Franky Macho, MD;  Location: [facility];  Service: General;  Laterality: N/A;   LYSIS OF ADHESION N/A 06/08/2015   Procedure: LYSIS OF ADHESION;  Surgeon: Franky Macho, MD;  Location: [facility];  Service: General;  Laterality: N/A;   MASTECTOMY     right   RIGHT/LEFT HEART CATH AND CORONARY ANGIOGRAPHY N/A 07/09/2020   Procedure: RIGHT/LEFT HEART CATH AND CORONARY ANGIOGRAPHY;  Surgeon: Yvonne Kendall, MD;  Location: [facility];  Service: Cardiovascular;  Laterality: N/A;   TOTAL HIP ARTHROPLASTY     right  Social History   Tobacco Use   Smoking status: Former    Current packs/day: 0.00    Types: Cigarettes    Quit date: 10/26/1978    Years since quitting: 44.4   Smokeless tobacco: Never  Vaping Use   Vaping status: Never Used  Substance Use Topics   Alcohol use: No   Drug use: No   Family History  Problem Relation Age of Onset   Cancer Brother    Cancer Other    Allergies  Allergen Reactions   Codeine Nausea And Vomiting    headache   Meclizine Other (See Comments)    Made dizziness worse.   Sulfa Antibiotics Nausea And Vomiting    Stomach upset   Entresto [Sacubitril-Valsartan]    Jardiance [Empagliflozin]    Zoloft [Sertraline Hcl] Anxiety   Review of Systems   Musculoskeletal:        R rib pain  All other systems reviewed and are negative.    Objective:     BP 100/63   Pulse 63   Resp 16   Ht 5\' 1"  (1.549 m)   Wt 154 lb 3.2 oz (69.9 kg)   SpO2 92%   BMI 29.14 kg/m  BP Readings from Last 3 Encounters:  03/31/23 100/63  03/17/23 98/62  03/11/23 107/78   Physical Exam Vitals reviewed.  Constitutional:      General: She is not in acute distress.    Appearance: Normal appearance. She is not toxic-appearing.  HENT:     Head: Normocephalic and atraumatic.     Right Ear: External ear normal.     Left Ear: External ear normal.     Nose: Nose normal. No congestion or rhinorrhea.     Mouth/Throat:     Mouth: Mucous membranes are moist.     Pharynx: Oropharynx is clear. No oropharyngeal exudate or posterior oropharyngeal erythema.  Eyes:     General: No scleral icterus.    Extraocular Movements: Extraocular movements intact.     Conjunctiva/sclera: Conjunctivae normal.     Pupils: Pupils are equal, round, and reactive to light.  Cardiovascular:     Rate and Rhythm: Normal rate and regular rhythm.     Pulses: Normal pulses.     Heart sounds: Normal heart sounds. No murmur heard.    No friction rub. No gallop.  Pulmonary:     Effort: Pulmonary effort is normal.     Breath sounds: Normal breath sounds. No wheezing, rhonchi or rales.  Abdominal:     General: Abdomen is flat. Bowel sounds are normal. There is no distension.     Palpations: Abdomen is soft.     Tenderness: There is no abdominal tenderness.  Musculoskeletal:        General: Signs of injury (Bruising present over the right ribs) present. No swelling. Normal range of motion.     Cervical back: Normal range of motion.     Right lower leg: No edema.     Left lower leg: No edema.  Lymphadenopathy:     Cervical: No cervical adenopathy.  Skin:    General: Skin is warm and dry.     Capillary Refill: Capillary refill takes less than 2 seconds.     Coloration: Skin is not  jaundiced.  Neurological:     General: No focal deficit present.     Mental Status: She is alert and oriented to person, place, and time.     Gait: Gait abnormal (Ambulates with cane).  Psychiatric:  Mood and Affect: Mood normal.        Behavior: Behavior normal.   Last CBC Lab Results  Component Value Date   WBC 39.0 (H) 03/11/2023   HGB 12.3 03/11/2023   HCT 39.0 03/11/2023   MCV 95.8 03/11/2023   MCH 30.2 03/11/2023   RDW 13.6 03/11/2023   PLT 368 03/11/2023   Last metabolic panel Lab Results  Component Value Date   GLUCOSE 171 (H) 03/11/2023   NA 137 03/11/2023   K 3.7 03/11/2023   CL 105 03/11/2023   CO2 20 (L) 03/11/2023   BUN 20 03/11/2023   CREATININE 0.99 03/11/2023   GFRNONAA 56 (L) 03/11/2023   CALCIUM 8.7 (L) 03/11/2023   PHOS 3.4 05/24/2022   PROT 6.6 11/02/2022   ALBUMIN 3.9 11/02/2022   BILITOT 0.9 11/02/2022   ALKPHOS 44 11/02/2022   AST 20 11/02/2022   ALT 12 11/02/2022   ANIONGAP 12 03/11/2023   Last lipids Lab Results  Component Value Date   CHOL 124 02/07/2023   HDL 61 02/07/2023   LDLCALC 48 02/07/2023   TRIG 73 02/07/2023   CHOLHDL 2.0 02/07/2023   Last hemoglobin A1c Lab Results  Component Value Date   HGBA1C 5.7 (H) 11/15/2021   Last thyroid functions Lab Results  Component Value Date   TSH 2.056 11/15/2021   Last vitamin D Lab Results  Component Value Date   VD25OH 22.44 (L) 11/02/2022   Last vitamin B12 and Folate Lab Results  Component Value Date   VITAMINB12 281 10/07/2020   FOLATE 11.6 10/07/2020     Assessment & Plan:   Problem List Items Addressed This Visit       Rib pain on right side - Primary    She fell while getting in her shower 3 weeks ago, hitting her right side on the shower door.  She has experienced pain in her right ribs since that time.  Evaluated for an acute visit on 10/25.  She has been managing pain with as needed use of Tylenol.  Ecchymoses is present over the right ribs.  She reports  that pain is gradually improving.  Recommend continued use of Tylenol as needed for pain relief.      Need for influenza vaccination    Influenza vaccine administered today       Return in about 3 months (around 07/01/2023).    Billie Lade, MD

## 2023-04-07 ENCOUNTER — Other Ambulatory Visit: Payer: Self-pay | Admitting: Internal Medicine

## 2023-04-28 ENCOUNTER — Other Ambulatory Visit: Payer: Self-pay

## 2023-04-28 MED ORDER — OMEPRAZOLE 20 MG PO CPDR: 20.0000 mg | DELAYED_RELEASE_CAPSULE | Freq: Every day | ORAL | 0 refills | Status: DC

## 2023-05-04 ENCOUNTER — Inpatient Hospital Stay: Payer: Medicare HMO | Attending: Hematology

## 2023-05-04 DIAGNOSIS — R61 Generalized hyperhidrosis: Secondary | ICD-10-CM | POA: Diagnosis not present

## 2023-05-04 DIAGNOSIS — E559 Vitamin D deficiency, unspecified: Secondary | ICD-10-CM | POA: Diagnosis not present

## 2023-05-04 DIAGNOSIS — C9110 Chronic lymphocytic leukemia of B-cell type not having achieved remission: Secondary | ICD-10-CM | POA: Diagnosis not present

## 2023-05-04 DIAGNOSIS — N951 Menopausal and female climacteric states: Secondary | ICD-10-CM | POA: Diagnosis not present

## 2023-05-04 DIAGNOSIS — Z853 Personal history of malignant neoplasm of breast: Secondary | ICD-10-CM | POA: Insufficient documentation

## 2023-05-04 DIAGNOSIS — C50911 Malignant neoplasm of unspecified site of right female breast: Secondary | ICD-10-CM

## 2023-05-04 LAB — VITAMIN D 25 HYDROXY (VIT D DEFICIENCY, FRACTURES): Vit D, 25-Hydroxy: 36.19 ng/mL (ref 30–100)

## 2023-05-04 LAB — CBC WITH DIFFERENTIAL/PLATELET
Abs Immature Granulocytes: 0 10*3/uL (ref 0.00–0.07)
Basophils Absolute: 0.6 10*3/uL — ABNORMAL HIGH (ref 0.0–0.1)
Basophils Relative: 2 %
Eosinophils Absolute: 0 10*3/uL (ref 0.0–0.5)
Eosinophils Relative: 0 %
HCT: 32.7 % — ABNORMAL LOW (ref 36.0–46.0)
Hemoglobin: 10.3 g/dL — ABNORMAL LOW (ref 12.0–15.0)
Lymphocytes Relative: 76 %
Lymphs Abs: 22 10*3/uL — ABNORMAL HIGH (ref 0.7–4.0)
MCH: 30.4 pg (ref 26.0–34.0)
MCHC: 31.5 g/dL (ref 30.0–36.0)
MCV: 96.5 fL (ref 80.0–100.0)
Monocytes Absolute: 0.3 10*3/uL (ref 0.1–1.0)
Monocytes Relative: 1 %
Neutro Abs: 6.1 10*3/uL (ref 1.7–7.7)
Neutrophils Relative %: 21 %
Platelets: 257 10*3/uL (ref 150–400)
RBC: 3.39 MIL/uL — ABNORMAL LOW (ref 3.87–5.11)
RDW: 14.7 % (ref 11.5–15.5)
WBC: 28.9 10*3/uL — ABNORMAL HIGH (ref 4.0–10.5)
nRBC: 0 % (ref 0.0–0.2)

## 2023-05-04 LAB — COMPREHENSIVE METABOLIC PANEL
ALT: 11 U/L (ref 0–44)
AST: 21 U/L (ref 15–41)
Albumin: 4.2 g/dL (ref 3.5–5.0)
Alkaline Phosphatase: 49 U/L (ref 38–126)
Anion gap: 12 (ref 5–15)
BUN: 25 mg/dL — ABNORMAL HIGH (ref 8–23)
CO2: 19 mmol/L — ABNORMAL LOW (ref 22–32)
Calcium: 9.1 mg/dL (ref 8.9–10.3)
Chloride: 107 mmol/L (ref 98–111)
Creatinine, Ser: 1.25 mg/dL — ABNORMAL HIGH (ref 0.44–1.00)
GFR, Estimated: 42 mL/min — ABNORMAL LOW (ref >60)
Glucose, Bld: 132 mg/dL — ABNORMAL HIGH (ref 70–99)
Potassium: 4.1 mmol/L (ref 3.5–5.1)
Sodium: 138 mmol/L (ref 135–145)
Total Bilirubin: 0.8 mg/dL (ref <1.2)
Total Protein: 6.7 g/dL (ref 6.5–8.1)

## 2023-05-04 LAB — LACTATE DEHYDROGENASE: LDH: 147 U/L (ref 98–192)

## 2023-05-10 ENCOUNTER — Telehealth: Payer: Self-pay

## 2023-05-10 ENCOUNTER — Other Ambulatory Visit: Payer: Self-pay

## 2023-05-10 MED ORDER — VEOZAH 45 MG PO TABS: 1.0000 | ORAL_TABLET | Freq: Every day | ORAL | 0 refills | Status: DC

## 2023-05-10 NOTE — Telephone Encounter (Signed)
Refills sent to pharmacy. 

## 2023-05-10 NOTE — Telephone Encounter (Signed)
Copied from CRM [id number]. Topic: Clinical - Medication Refill >> May 10, 2023 11:20 AM Shelah Lewandowsky wrote: Most Recent Primary Care Visit:  Provider: Christel Mormon E  Department: [facility] C[facility]  Visit Type: OFFICE VISIT  Date: 03/31/2023  Medication: VEOZAH 45 MG TABS  Has the patient contacted their pharmacy? Yes   She is out of refills (Agent: If no, request that the patient contact the pharmacy for the refill. If patient does not wish to contact the pharmacy document the reason why and proceed with request.) (Agent: If yes, when and what did the pharmacy advise?)  Is this the correct pharmacy for this prescription? Yes If no, delete pharmacy and type the correct one.  This is the patient's preferred pharmacy:  [facility] - [place], [place] - [address] [place] [address] Phone: [phone] Fax: [phone]   Has the prescription been filled recently? Yes  Is the patient out of the medication? Yes  Has the patient been seen for an appointment in the last year OR does the patient have an upcoming appointment? Yes  Can we respond through MyChart? No  Agent: Please be advised that Rx refills may take up to 3 business days. We ask that you follow-up with your pharmacy.

## 2023-05-11 ENCOUNTER — Other Ambulatory Visit: Payer: Medicare HMO

## 2023-05-11 ENCOUNTER — Inpatient Hospital Stay: Payer: Medicare HMO

## 2023-05-11 ENCOUNTER — Encounter: Payer: Self-pay | Admitting: Hematology

## 2023-05-11 ENCOUNTER — Inpatient Hospital Stay: Payer: Medicare HMO | Admitting: Hematology

## 2023-05-11 VITALS — BP 100/54 | HR 63 | Temp 97.7°F | Resp 18 | Wt 152.0 lb

## 2023-05-11 DIAGNOSIS — C9110 Chronic lymphocytic leukemia of B-cell type not having achieved remission: Secondary | ICD-10-CM | POA: Diagnosis not present

## 2023-05-11 DIAGNOSIS — R61 Generalized hyperhidrosis: Secondary | ICD-10-CM | POA: Diagnosis not present

## 2023-05-11 DIAGNOSIS — Z853 Personal history of malignant neoplasm of breast: Secondary | ICD-10-CM | POA: Diagnosis not present

## 2023-05-11 DIAGNOSIS — D649 Anemia, unspecified: Secondary | ICD-10-CM

## 2023-05-11 DIAGNOSIS — N951 Menopausal and female climacteric states: Secondary | ICD-10-CM | POA: Diagnosis not present

## 2023-05-11 DIAGNOSIS — E559 Vitamin D deficiency, unspecified: Secondary | ICD-10-CM | POA: Diagnosis not present

## 2023-05-11 LAB — IRON AND TIBC
Iron: 45 ug/dL (ref 28–170)
Saturation Ratios: 11 % (ref 10.4–31.8)
TIBC: 424 ug/dL (ref 250–450)
UIBC: 379 ug/dL

## 2023-05-11 LAB — FERRITIN: Ferritin: 13 ng/mL (ref 11–307)

## 2023-05-11 NOTE — Patient Instructions (Addendum)
[facility] at [facility] Discharge Instructions   You were seen and examined today by Dr. Ellin Saba.  He reviewed the results of your lab work which was mostly normal/stable. Your hemoglobin has dropped to 10.2. Dr. Kirtland Bouchard recommends you take an iron tablet (over the counter, 325 mg) daily. Your vitamin D is normal. Continue taking the vitamin D as you have been.   We will check your iron levels today. If they are low we will recommend you take iron tablets as above.   We will see you back in 6 months. We will repeat lab work prior to this visit.   Return as scheduled.      Thank you for choosing [facility] at [facility] to provide your oncology and hematology care.  To afford each patient quality time with our provider, please arrive at least 15 minutes before your scheduled appointment time.   If you have a lab appointment with the Cancer Center please come in thru the Main Entrance and check in at the main information desk.  You need to re-schedule your appointment should you arrive 10 or more minutes late.  We strive to give you quality time with our providers, and arriving late affects you and other patients whose appointments are after yours.  Also, if you no show three or more times for appointments you may be dismissed from the clinic at the providers discretion.     Again, thank you for choosing [facility].  Our hope is that these requests will decrease the amount of time that you wait before being seen by our physicians.       _____________________________________________________________  Should you have questions after your visit to [facility], please contact our office at [phone] and follow the prompts.  Our office hours are 8:00 a.m. and 4:30 p.m. Monday - Friday.  Please note that voicemails left after 4:00 p.m. may not be returned until the following business day.  We are closed weekends and major  holidays.  You do have access to a nurse 24-7, just call the main number to the clinic [phone] and do not press any options, hold on the line and a nurse will answer the phone.    For prescription refill requests, have your pharmacy contact our office and allow 72 hours.    Due to Covid, you will need to wear a mask upon entering the hospital. If you do not have a mask, a mask will be given to you at the Main Entrance upon arrival. For doctor visits, patients may have 1 support person age 34 or older with them. For treatment visits, patients can not have anyone with them due to social distancing guidelines and our immunocompromised population.

## 2023-05-11 NOTE — Progress Notes (Signed)
[facility] [address] [place] [address]    Clinic Day:  05/11/2023  Referring physician: Gareth Morgan, MD  Patient Care Team: Billie Lade, MD as PCP - General (Internal Medicine) Pricilla Riffle, MD as PCP - Cardiology (Cardiology) Ollen Gross, MD as Consulting Physician (Orthopedic Surgery)   ASSESSMENT & PLAN:   Assessment: 1.  CLL: -Patient was diagnosed 25+ years ago.  Has never required treatment. -Patient reports ongoing hot flashes and night sweats.  She denies any adenopathy.  No other B symptoms. -On 03/26/2019 patient had a CT scan of her chest which revealed a 17 mm irregular soft tissue density along with the medial aspect of the left lung base. -CT of chest on 04/23/2019 showed interval resolution of previously demonstrated small pleural effusions.  No suspicious pulmonary nodules.     2.  Stage III right breast cancer: -Patient was diagnosed in 1980 /1999. -Treated with neoadjuvant chemotherapy followed by right mastectomy then XRT.    Plan: 1. CLL: - She denies any fevers, night sweats or weight loss in the last 6 months. - Physical exam: No palpable adenopathy in the neck and axillary region. - Hot flashes improved on Veozah. - Reviewed labs from 05/04/2023: Normal LDH.  White count is 28, hemoglobin 10.3.  There is a 2 g drop of hemoglobin.  I have checked iron panel today which showed ferritin 13 and percent saturation 11. - She will start iron tablet daily with stool softener.  If she cannot tolerate will give parenteral iron therapy. - RTC 6 months for follow-up with repeat labs.   2.  Stage III right breast cancer: - Left breast mammogram on 06/16/2022: BI-RADS Category 1.  She will have mammogram in January.   3.  Vitamin D deficiency: - Continue vitamin D 2000 units Monday Wednesday and Friday and 1000 units rest of the week. - Vitamin D level is normal at 36.      Orders Placed This Encounter  Procedures    Iron and TIBC ([facility] ONLY)    Standing Status:   Future    Number of Occurrences:   1    Expected Date:   05/11/2023    Expiration Date:   05/10/2024   Ferritin    Standing Status:   Future    Number of Occurrences:   1    Expected Date:   05/11/2023    Expiration Date:   05/10/2024      Mikeal Hawthorne R Teague,acting as a scribe for Doreatha Massed, MD.,have documented all relevant documentation on the behalf of Doreatha Massed, MD,as directed by  Doreatha Massed, MD while in the presence of Doreatha Massed, MD.  I, Doreatha Massed MD, have reviewed the above documentation for accuracy and completeness, and I agree with the above.    Doreatha Massed, MD   12/19/20245:20 PM  CHIEF COMPLAINT:   Diagnosis: CLL    Cancer Staging  No matching staging information was found for the patient.    Prior Therapy: none  Current Therapy:  surveillance    HISTORY OF PRESENT ILLNESS:   Oncology History   No history exists.     INTERVAL HISTORY:   [name] is a 87 y.o. female presenting to clinic today for follow up of CLL. She was last seen by me on 11/09/22.  Since her last visit, she was seen by urgent care on 03/05/23 for a viral URI with cough and the ED on 03/11/23 for community acquired  pneumonia. She was given azithromycin 250 mg OD. She has a left MM scheduled for 06/19/23.   Today, she states that she is doing well overall. Her appetite level is at 100%. Her energy level is at 50%. She is accompanied by her friend. She reports her energy waxes and wanes. She has no history of taking iron pills. She states since taking Veozah 45 mg OD, she has not had any hot flashes or night sweats. She is taking Vitamin D as prescribed. She denies any infections, fevers, or night sweats in the last 6 months. She denies any BRBPR or melena.   PAST MEDICAL HISTORY:   Past Medical History: Past Medical History:  Diagnosis Date   Bowel obstruction (HCC)     Breast cancer (HCC)    Breast cancer, stage 3 (HCC) 07/21/2011   Bronchitis    CAD (coronary artery disease)    a. 06/2020 Cath: LM nl, LAD 23m, LCX min irregs, RCA mild diff dzs. PA 34/15(21), PCWP 15. CO 5.0 L/min. EF 25-35%, glob HK.   Chronic heart failure with preserved ejection fraction (HFpEF) (HCC)    a. 06/2018 Echo: EF 55-60%; b. 03/2019 Echo: EF 40-45%; c. 05/2020 Echo: EF 35-40%; d. 12/2020 Echo: EF 35-40%; e. 03/2021 Echo: EF 35-40%. mod LVH,GrI DD, nl RV fxn, mod dil LA/RA, mild MR/AS.   CLL (chronic lymphocytic leukemia) (HCC) 07/21/2011   Migraines    NICM (nonischemic cardiomyopathy) (HCC)    PAF (paroxysmal atrial fibrillation) (HCC)    a. Dx 06/2018 in setting of COPD flare-->bb/eliquis (CHA2DS2VASc = 6); b. 02/2020 Zio: Predominantly sinus rhythm at an average heart rate of 60 (39-190).  3% atrial fibrillation burden and average of 127 (59-1 and 37), longest lasting 9 hours and 23 minutes.  43 runs of PSVT noted.  3 runs of nonsustained VT noted.   Pneumonia    PSVT (paroxysmal supraventricular tachycardia) (HCC)    a. 02/2020 Zio: 43 rounds of symptomatic PSVT.  Longest 19 beats; fastest 164 bpm x 14 beats.    Surgical History: Past Surgical History:  Procedure Laterality Date   ABDOMINAL HYSTERECTOMY     APPENDECTOMY     CATARACT EXTRACTION Bilateral 2023   CHOLECYSTECTOMY     LAPAROTOMY N/A 06/08/2015   Procedure: EXPLORATORY LAPAROTOMY;  Surgeon: Franky Macho, MD;  Location: [facility];  Service: General;  Laterality: N/A;   LYSIS OF ADHESION N/A 06/08/2015   Procedure: LYSIS OF ADHESION;  Surgeon: Franky Macho, MD;  Location: [facility];  Service: General;  Laterality: N/A;   MASTECTOMY     right   RIGHT/LEFT HEART CATH AND CORONARY ANGIOGRAPHY N/A 07/09/2020   Procedure: RIGHT/LEFT HEART CATH AND CORONARY ANGIOGRAPHY;  Surgeon: Yvonne Kendall, MD;  Location: [facility];  Service: Cardiovascular;  Laterality: N/A;   TOTAL HIP ARTHROPLASTY     right     Social History: Social History   Socioeconomic History   Marital status: Widowed    Spouse name: Not on file   Number of children: Not on file   Years of education: 11   Highest education level: Not on file  Occupational History   Not on file  Tobacco Use   Smoking status: Former    Current packs/day: 0.00    Types: Cigarettes    Quit date: 10/26/1978    Years since quitting: 44.5   Smokeless tobacco: Never  Vaping Use   Vaping status: Never Used  Substance and Sexual Activity   Alcohol use: No  Drug use: No   Sexual activity: Not Currently  Other Topics Concern   Not on file  Social History Narrative   Pt lives by herself   Social Drivers of Health   Financial Resource Strain: Low Risk  (02/21/2023)   Overall Financial Resource Strain (CARDIA)    Difficulty of Paying Living Expenses: Not hard at all  Food Insecurity: No Food Insecurity (02/21/2023)   Hunger Vital Sign    Worried About Running Out of Food in the Last Year: Never true    Ran Out of Food in the Last Year: Never true  Transportation Needs: No Transportation Needs (02/21/2023)   PRAPARE - Administrator, Civil Service (Medical): No    Lack of Transportation (Non-Medical): No  Physical Activity: Insufficiently Active (02/21/2023)   Exercise Vital Sign    Days of Exercise per Week: 2 days    Minutes of Exercise per Session: 20 min  Stress: No Stress Concern Present (02/21/2023)   [employer] of Occupational Health - Occupational Stress Questionnaire    Feeling of Stress : Not at all  Social Connections: Moderately Isolated (02/21/2023)   Social Connection and Isolation Panel [NHANES]    Frequency of Communication with Friends and Family: More than three times a week    Frequency of Social Gatherings with Friends and Family: More than three times a week    Attends Religious Services: More than 4 times per year    Active Member of [employer] or Organizations: No    Attends Tax inspector Meetings: Never    Marital Status: Widowed  Intimate Partner Violence: Not At Risk (02/21/2023)   Humiliation, Afraid, Rape, and Kick questionnaire    Fear of Current or Ex-Partner: No    Emotionally Abused: No    Physically Abused: No    Sexually Abused: No    Family History: Family History  Problem Relation Age of Onset   Cancer Brother    Cancer Other     Current Medications:  Current Outpatient Medications:    acetaminophen (TYLENOL) 500 MG tablet, Take 1,000 mg by mouth every 6 (six) hours as needed for moderate pain or headache., Disp: , Rfl:    albuterol (PROVENTIL HFA;VENTOLIN HFA) 108 (90 BASE) MCG/ACT inhaler, Inhale 2 puffs into the lungs every 6 (six) hours as needed for shortness of breath., Disp: , Rfl:    albuterol (PROVENTIL) (2.5 MG/3ML) 0.083% nebulizer solution, Take 3 mLs (2.5 mg total) by nebulization every 6 (six) hours as needed for wheezing or shortness of breath (when not relieved by inhaler.)., Disp: 75 mL, Rfl: 12   apixaban (ELIQUIS) 5 MG TABS tablet, TAKE 1 TABLET BY MOUTH TWICE A DAY, Disp: 180 tablet, Rfl: 1   atorvastatin (LIPITOR) 20 MG tablet, Take 1 tablet (20 mg total) by mouth daily., Disp: 90 tablet, Rfl: 3   bisoprolol (ZEBETA) 5 MG tablet, TAKE (1/2) TABLET BY MOUTH ONCE DAILY., Disp: 30 tablet, Rfl: 3   cholecalciferol (VITAMIN D3) 25 MCG (1000 UNIT) tablet, Take 1 capsule by mouth daily., Disp: , Rfl:    diphenhydrAMINE (BENADRYL) 25 MG tablet, Take 25 mg by mouth daily as needed for allergies., Disp: , Rfl:    losartan (COZAAR) 25 MG tablet, TAKE (1/2) TABLET BY MOUTH DAILY, Disp: 45 tablet, Rfl: 3   Misc. Devices MISC, Please provide patient with breast prosthesis and mastectomy bra. Dx: C50.911, Disp: 1 each, Rfl: 0   ondansetron (ZOFRAN) 4 MG tablet, Take 1 tablet (4 mg  total) by mouth every 8 (eight) hours as needed for nausea or vomiting., Disp: 20 tablet, Rfl: 0   spironolactone (ALDACTONE) 25 MG tablet, TAKE (1) TABLET BY  MOUTH DAILY., Disp: 90 tablet, Rfl: 2   VEOZAH 45 MG TABS, Take 1 tablet (45 mg total) by mouth daily., Disp: 30 tablet, Rfl: 0   omeprazole (PRILOSEC) 20 MG capsule, Take 1 capsule (20 mg total) by mouth daily., Disp: 90 capsule, Rfl: 0  Current Facility-Administered Medications:    methylPREDNISolone acetate (DEPO-MEDROL) injection 40 mg, 40 mg, Intramuscular, Once,    Allergies: Allergies  Allergen Reactions   Codeine Nausea And Vomiting    headache   Meclizine Other (See Comments)    Made dizziness worse.   Sulfa Antibiotics Nausea And Vomiting    Stomach upset   Entresto [Sacubitril-Valsartan]    Jardiance [Empagliflozin]    Zoloft [Sertraline Hcl] Anxiety    REVIEW OF SYSTEMS:   Review of Systems  Constitutional:  Negative for chills, fatigue and fever.  HENT:   Negative for lump/mass, mouth sores, nosebleeds, sore throat and trouble swallowing.   Eyes:  Negative for eye problems.  Respiratory:  Positive for shortness of breath (with exertion). Negative for cough.   Cardiovascular:  Positive for leg swelling. Negative for chest pain and palpitations.  Gastrointestinal:  Positive for nausea. Negative for abdominal pain, constipation, diarrhea and vomiting.  Genitourinary:  Negative for bladder incontinence, difficulty urinating, dysuria, frequency, hematuria and nocturia.   Musculoskeletal:  Positive for back pain (in lower back, 5/10 severity). Negative for arthralgias, flank pain, myalgias and neck pain.  Skin:  Negative for itching and rash.  Neurological:  Negative for dizziness, headaches and numbness.  Hematological:  Does not bruise/bleed easily.       +ecchymosis  Psychiatric/Behavioral:  Negative for depression, sleep disturbance and suicidal ideas. The patient is not nervous/anxious.   All other systems reviewed and are negative.    VITALS:   Blood pressure (!) 100/54, pulse 63, temperature 97.7 F (36.5 C), temperature source Oral, resp. rate 18, weight 152  lb (68.9 kg), SpO2 96%.  Wt Readings from Last 3 Encounters:  05/11/23 152 lb (68.9 kg)  03/31/23 154 lb 3.2 oz (69.9 kg)  03/17/23 153 lb 1.3 oz (69.4 kg)    Body mass index is 28.72 kg/m.  Performance status (ECOG): 1 - Symptomatic but completely ambulatory  PHYSICAL EXAM:   Physical Exam Vitals and nursing note reviewed. Exam conducted with a chaperone present.  Constitutional:      Appearance: Normal appearance.  Cardiovascular:     Rate and Rhythm: Normal rate and regular rhythm.     Pulses: Normal pulses.     Heart sounds: Normal heart sounds.  Pulmonary:     Effort: Pulmonary effort is normal.     Breath sounds: Normal breath sounds.  Abdominal:     Palpations: Abdomen is soft. There is no hepatomegaly, splenomegaly or mass.     Tenderness: There is no abdominal tenderness.  Musculoskeletal:     Right lower leg: No edema.     Left lower leg: No edema.  Lymphadenopathy:     Cervical: No cervical adenopathy.     Right cervical: No superficial, deep or posterior cervical adenopathy.    Left cervical: No superficial, deep or posterior cervical adenopathy.     Upper Body:     Right upper body: No supraclavicular or axillary adenopathy.     Left upper body: No supraclavicular or axillary adenopathy.  Neurological:  General: No focal deficit present.     Mental Status: She is alert and oriented to person, place, and time.  Psychiatric:        Mood and Affect: Mood normal.        Behavior: Behavior normal.     LABS:      Latest Ref Rng & Units 05/04/2023   12:51 PM 03/11/2023    8:22 PM 11/02/2022    1:16 PM  CBC  WBC 4.0 - 10.5 K/uL 28.9  39.0  32.7   Hemoglobin 12.0 - 15.0 g/dL 16.1  09.6  04.5   Hematocrit 36.0 - 46.0 % 32.7  39.0  38.0   Platelets 150 - 400 K/uL 257  368  240       Latest Ref Rng & Units 05/04/2023   12:51 PM 03/11/2023    8:22 PM 11/02/2022    1:16 PM  CMP  Glucose 70 - 99 mg/dL 409  811  914   BUN 8 - 23 mg/dL 25  20  21     Creatinine 0.44 - 1.00 mg/dL 7.82  9.56  2.13   Sodium 135 - 145 mmol/L 138  137  140   Potassium 3.5 - 5.1 mmol/L 4.1  3.7  3.8   Chloride 98 - 111 mmol/L 107  105  107   CO2 22 - 32 mmol/L 19  20  21    Calcium 8.9 - 10.3 mg/dL 9.1  8.7  9.0   Total Protein 6.5 - 8.1 g/dL 6.7   6.6   Total Bilirubin <1.2 mg/dL 0.8   0.9   Alkaline Phos 38 - 126 U/L 49   44   AST 15 - 41 U/L 21   20   ALT 0 - 44 U/L 11   12      Lab Results  Component Value Date   CEA 0.8 06/28/2013   /  CEA  Date Value Ref Range Status  06/28/2013 0.8 0.0 - 5.0 ng/mL Final    Comment:    Performed at [facility]   No results found for: "PSA1" No results found for: "CAN199" No results found for: "CAN125"  No results found for: "TOTALPROTELP", "ALBUMINELP", "A1GS", "A2GS", "BETS", "BETA2SER", "GAMS", "MSPIKE", "SPEI" Lab Results  Component Value Date   TIBC 424 05/11/2023   FERRITIN 13 05/11/2023   IRONPCTSAT 11 05/11/2023   Lab Results  Component Value Date   LDH 147 05/04/2023   LDH 141 11/02/2022   LDH 130 04/26/2022     STUDIES:   No results found.

## 2023-05-15 ENCOUNTER — Telehealth: Payer: Self-pay | Admitting: *Deleted

## 2023-05-15 NOTE — Telephone Encounter (Signed)
Per Dr. Ellin Saba, made aware to take iron 325 mg daily along with stool softener.  Verbalized understanding.

## 2023-05-15 NOTE — Telephone Encounter (Signed)
Per Dr. Ellin Saba, patient advised to start iron tablet 325 mg daily with stool softener. LM on VM and requested a callback.

## 2023-06-06 ENCOUNTER — Other Ambulatory Visit: Payer: Self-pay | Admitting: Internal Medicine

## 2023-06-09 ENCOUNTER — Ambulatory Visit: Payer: Medicare HMO | Attending: Student | Admitting: Student

## 2023-06-09 ENCOUNTER — Encounter: Payer: Self-pay | Admitting: Student

## 2023-06-09 VITALS — Ht 61.0 in | Wt 151.0 lb

## 2023-06-09 DIAGNOSIS — R42 Dizziness and giddiness: Secondary | ICD-10-CM

## 2023-06-09 DIAGNOSIS — I2510 Atherosclerotic heart disease of native coronary artery without angina pectoris: Secondary | ICD-10-CM | POA: Diagnosis not present

## 2023-06-09 DIAGNOSIS — I480 Paroxysmal atrial fibrillation: Secondary | ICD-10-CM | POA: Diagnosis not present

## 2023-06-09 DIAGNOSIS — I350 Nonrheumatic aortic (valve) stenosis: Secondary | ICD-10-CM

## 2023-06-09 DIAGNOSIS — C9110 Chronic lymphocytic leukemia of B-cell type not having achieved remission: Secondary | ICD-10-CM | POA: Diagnosis not present

## 2023-06-09 DIAGNOSIS — I5022 Chronic systolic (congestive) heart failure: Secondary | ICD-10-CM | POA: Diagnosis not present

## 2023-06-09 MED ORDER — SPIRONOLACTONE 25 MG PO TABS: 12.5000 mg | ORAL_TABLET | Freq: Every day | ORAL | 3 refills | Status: DC

## 2023-06-09 NOTE — Progress Notes (Unsigned)
Cardiology Office Note    Date:  06/10/2023  ID:  [name], DOB 1935/06/26, MRN [id number] Cardiologist: Dietrich Pates, MD    History of Present Illness:    [name] is a 88 y.o. female with past medical history of paroxysmal atrial fibrillation, chronic HFrEF/NICM (EF previously 55-60% in 06/2018, reduced to 40-45% by repeat echo in 03/2019, 35-40% by echo in 05/2020 and 12/2020), CAD (cath in 06/2020 showing mild to moderate nonobstructive CAD), aortic stenosis, LBBB, CLL, history of breast cancer and COPD who presents to the office today for evaluation of dizziness and shortness of breath.  She was examined by Dr. Tenny Craw in 01/2023 and reported having episodes of lightheadedness at that time and spells would last for a few minutes and then resolve. Her weight was stable at 155 lbs and BP was at 112/70. No changes were made to her cardiac medications and she was continued on Eliquis 5 mg twice daily, Atorvastatin 20 mg daily, Bisoprolol 2.5 mg daily, Losartan 12.5 mg daily and Spironolactone 25 mg daily. She had previously been intolerant to Entresto and SGLT2 inhibitors.  In talking with the patient and one of her close friends today, she reports having worsening dizziness and lightheadedness over the past few months. Denies any actual syncopal episodes. The dizziness and lightheadedness can occur while sitting or with positional changes. No recent chest pain or persistent palpitations. No specific orthopnea, PND or pitting edema.  She reports good compliance with current medical therapy. Consumes approximately 30-40 ounces of fluid daily.   Studies Reviewed:   EKG: EKG is ordered today and demonstrates:   EKG Interpretation Date/Time:  Friday June 09 2023 15:33:03 EST Ventricular Rate:  58 PR Interval:  188 QRS Duration:  136 QT Interval:  482 QTC Calculation: 473 R Axis:   -12  Text Interpretation: Sinus bradycardia Left bundle branch block When compared with ECG of  11-Mar-2023 19:53, Sinus rhythm has replaced Atrial fibrillation Confirmed by Randall An ([id number]) on 06/09/2023 3:37:34 PM       R/LHC: 06/2020 Conclusions: Mild to moderate, non-obstructive coronary artery disease with 30-40% mid LAD stenosis and mild luminal irregularities involving the LCx and RCA. Severely reduced left ventricular systolic function (LVEF 25-35%). Upper normal left heart, right heart, and pulmonary artery pressures. Normal Fick cardiac output/index. Mild aortic stenosis.   Recommendations: Medical therapy and risk factor modification to prevent progression of coronary artery disease. Escalate goal directed medical therapy for treatment of nonischemic cardiomyopathy, as tolerated. Restart apixaban tomorrow morning if no evidence of bleeding/vascular complication.    Echocardiogram: 03/2021 IMPRESSIONS     1. Left ventricular ejection fraction, by estimation, is 35 to 40%. The  left ventricle has moderately decreased function. The left ventricle  demonstrates regional wall motion abnormalities (see scoring  diagram/findings for description). There is  moderate left ventricular hypertrophy. Left ventricular diastolic  parameters are consistent with Grade I diastolic dysfunction (impaired  relaxation). Elevated left atrial pressure.   2. Right ventricular systolic function is normal. The right ventricular  size is normal. There is mildly elevated pulmonary artery systolic  pressure.   3. Left atrial size was moderately dilated.   4. Right atrial size was mild to moderately dilated.   5. The mitral valve is abnormal. Mild mitral valve regurgitation. Mild  mitral stenosis. Moderate mitral annular calcification.   6. The aortic valve is tricuspid. There is moderate calcification of the  aortic valve. There is moderate thickening of the aortic valve. Aortic  valve regurgitation is not visualized. Mild aortic valve stenosis.    Risk Assessment/Calculations:     CHA2DS2-VASc Score = 5   This indicates a 7.2% annual risk of stroke. The patient's score is based upon: CHF History: 1 HTN History: 0 Diabetes History: 0 Stroke History: 0 Vascular Disease History: 1 Age Score: 2 Gender Score: 1    Physical Exam:   VS:  Ht 5\' 1"  (1.549 m)   Wt 151 lb (68.5 kg)   SpO2 99%   BMI 28.53 kg/m    Wt Readings from Last 3 Encounters:  06/09/23 151 lb (68.5 kg)  05/11/23 152 lb (68.9 kg)  03/31/23 154 lb 3.2 oz (69.9 kg)     GEN: Pleasant, elderly female appearing in no acute distress NECK: No JVD; No carotid bruits CARDIAC: RRR, 2/6 SEM along RUSB.  RESPIRATORY:  Clear to auscultation without rales, wheezing or rhonchi  ABDOMEN: Appears non-distended. No obvious abdominal masses. EXTREMITIES: No clubbing or cyanosis. No pitting edema.  Distal pedal pulses are 2+ bilaterally.   Assessment and Plan:   1. Chronic HFrEF/NICM - Her EF was at 35-40% by most recent echocardiogram in 03/2021. Her respiratory status has been stable but she has been experiencing worsening dizziness and lightheadedness as discussed above and BP dropped to 79/58 with standing today.  - She is currently on Bisoprolol 2.5 mg daily, Losartan 12.5 mg daily and Spironolactone 25 mg daily. Will try reducing Spironolactone to 12.5mg  daily to see if this helps with her symptoms. Will arrange for a nurse visit in 2 weeks for repeat BP check and orthostatics. She had previously been intolerant to Entresto and SGLT2 inhibitors which has limited GDMT. Would anticipate a repeat echocardiogram within the next year for reassessment of her EF and AS.   2. Dizziness - Orthostatics were checked today and did not technically meet criteria but SBP did drop from 93 to 79. Given her episodic dizziness and lightheadedness, I did recommend reducing Spironolactone as discussed above. Also encouraged her to increase her fluid consumption and use compression stockings if able to apply these.   3.  CAD/HLD - She had mild to moderate nonobstructive disease by cardiac catheterization 06/2020. Denies any recent chest pain. Continue Atorvastatin 20mg  daily (LDL was at 48 when checked in 01/2023) and Bisoprolol 2.5mg  daily. She is no longer on ASA given the need for anticoagulation.    4. Paroxysmal Atrial Fibrillation - Reports occasional palpitations but no persistent symptoms. Remains on Bisoprolol 2.5mg  daily. Would not further titrate given her hypotension as discussed above.  - No reports of active bleeding. Remains on Eliquis 5mg  BID for anticoagulation which is the appropriate dose at this time as her only indication for reduced dosing is age.   5. Aortic Stenosis - AS was mild by echocardiogram in 2022. Would anticipate repeat imaging within the next year.   6. CLL - Followed by Oncology. She has never required treatment by review of notes from 04/2023.  Signed, Ellsworth Lennox, PA-C

## 2023-06-09 NOTE — Patient Instructions (Signed)
Medication Instructions:  Your physician has recommended you make the following change in your medication:   Decrease Spironolactone to 12.5 mg Daily  Wear Compression stockings daily   *If you need a refill on your cardiac medications before your next appointment, please call your pharmacy*   Lab Work: NONE   If you have labs (blood work) drawn today and your tests are completely normal, you will receive your results only by: MyChart Message (if you have MyChart) OR A paper copy in the mail If you have any lab test that is abnormal or we need to change your treatment, we will call you to review the results.   Testing/Procedures: NONE    Follow-Up: At [facility], you and your health needs are our priority.  As part of our continuing mission to provide you with exceptional heart care, we have created designated Provider Care Teams.  These Care Teams include your primary Cardiologist (physician) and Advanced Practice Providers (APPs -  Physician Assistants and Nurse Practitioners) who all work together to provide you with the care you need, when you need it.  We recommend signing up for the patient portal called "MyChart".  Sign up information is provided on this After Visit Summary.  MyChart is used to connect with patients for Virtual Visits (Telemedicine).  Patients are able to view lab/test results, encounter notes, upcoming appointments, etc.  Non-urgent messages can be sent to your provider as well.   To learn more about what you can do with MyChart, go to [email or web address].    Your next appointment:   3 -4 month(s)  Provider:   You may see Dietrich Pates, MD or one of the following Advanced Practice Providers on your designated Care Team:   Randall An, PA-C  Jacolyn Reedy, PA-C     Other Instructions Thank you for choosing [facility]!

## 2023-06-10 ENCOUNTER — Encounter: Payer: Self-pay | Admitting: Student

## 2023-06-19 ENCOUNTER — Ambulatory Visit (HOSPITAL_COMMUNITY)
Admission: RE | Admit: 2023-06-19 | Discharge: 2023-06-19 | Disposition: A | Discharge status: Home / Self Care | Payer: Medicare HMO | Source: Ambulatory Visit | Attending: Hematology | Admitting: Hematology

## 2023-06-19 DIAGNOSIS — Z1231 Encounter for screening mammogram for malignant neoplasm of breast: Secondary | ICD-10-CM | POA: Diagnosis not present

## 2023-07-07 ENCOUNTER — Ambulatory Visit: Payer: Self-pay

## 2023-07-07 ENCOUNTER — Ambulatory Visit: Payer: Medicare HMO

## 2023-07-07 ENCOUNTER — Encounter: Payer: Self-pay | Admitting: Internal Medicine

## 2023-07-07 ENCOUNTER — Telehealth: Payer: Self-pay | Admitting: *Deleted

## 2023-07-07 ENCOUNTER — Telehealth: Payer: Self-pay | Admitting: Internal Medicine

## 2023-07-07 ENCOUNTER — Ambulatory Visit (INDEPENDENT_AMBULATORY_CARE_PROVIDER_SITE_OTHER): Payer: Medicare HMO | Admitting: Internal Medicine

## 2023-07-07 VITALS — BP 121/81 | HR 63 | Ht 61.0 in | Wt 150.8 lb

## 2023-07-07 DIAGNOSIS — I5042 Chronic combined systolic (congestive) and diastolic (congestive) heart failure: Secondary | ICD-10-CM

## 2023-07-07 DIAGNOSIS — J4489 Other specified chronic obstructive pulmonary disease: Secondary | ICD-10-CM | POA: Diagnosis not present

## 2023-07-07 DIAGNOSIS — R42 Dizziness and giddiness: Secondary | ICD-10-CM

## 2023-07-07 DIAGNOSIS — E782 Mixed hyperlipidemia: Secondary | ICD-10-CM

## 2023-07-07 DIAGNOSIS — E611 Iron deficiency: Secondary | ICD-10-CM | POA: Insufficient documentation

## 2023-07-07 DIAGNOSIS — R232 Flushing: Secondary | ICD-10-CM | POA: Diagnosis not present

## 2023-07-07 DIAGNOSIS — I480 Paroxysmal atrial fibrillation: Secondary | ICD-10-CM | POA: Diagnosis not present

## 2023-07-07 DIAGNOSIS — C9110 Chronic lymphocytic leukemia of B-cell type not having achieved remission: Secondary | ICD-10-CM | POA: Diagnosis not present

## 2023-07-07 NOTE — Telephone Encounter (Signed)
See previous encounter. Patient states she saw Dr. Durwin Nora and they checked her BP and they are sending the report to the office. Nurse visit has been cancelled--FYI.

## 2023-07-07 NOTE — Assessment & Plan Note (Signed)
Recently started oral iron supplementation in the setting of borderline iron deficiency per oncology recommendation.

## 2023-07-07 NOTE — Assessment & Plan Note (Signed)
Remains euvolemic on exam.  On appropriate GDMT.  Recently seen by cardiology for follow-up.

## 2023-07-07 NOTE — Patient Instructions (Signed)
It was a pleasure to see you today.  Thank you for giving [place] the opportunity to be involved in your care.  Below is a brief recap of your visit and next steps.  We will plan to see you again in 3 months.  Summary No medication changes today We will check orthostatic vitals and send to cardiology office Follow up in 3 months

## 2023-07-07 NOTE — Telephone Encounter (Signed)
Pt had bp checked at PCP's office during visit today. BP was 121/81.  Nurse Visit for today was cancelled.

## 2023-07-07 NOTE — Assessment & Plan Note (Signed)
Veozah remains effective for symptom relief.

## 2023-07-07 NOTE — Assessment & Plan Note (Addendum)
Asymptomatic currently.  Pulmonary exam is unremarkable.  Requires albuterol seldomly.

## 2023-07-07 NOTE — Assessment & Plan Note (Signed)
Recently seen by oncology for follow-up.  Cell counts stable.  Oncology follow-up scheduled for June.

## 2023-07-07 NOTE — Assessment & Plan Note (Signed)
Regular rate and rhythm detected on exam today.  She remains on Eliquis 5 mg twice daily and bisoprolol.  No medication changes are indicated today.  Recently seen by cardiology for follow-up.

## 2023-07-07 NOTE — Progress Notes (Signed)
Established Patient Office Visit  Subjective   Patient ID: [name], female    DOB: 04/05/1936  Age: 88 y.o. MRN: [id number]  Chief Complaint  Patient presents with   Care Management    Follow up    [name] returns to care today for routine follow-up.  She was last evaluated by me in November 2024.  No medication changes were made at that time and 67-month follow-up was arranged.  In the interim she has been evaluated by oncology and cardiology.  There have otherwise been no acute interval events. [name] reports feeling well today.  Previously endorsed symptoms of dizziness have resolved with reducing spironolactone.  She does not have any acute concerns to discuss today.  Past Medical History:  Diagnosis Date   Bowel obstruction (HCC)    Breast cancer (HCC)    Breast cancer, stage 3 (HCC) 07/21/2011   Bronchitis    CAD (coronary artery disease)    a. 06/2020 Cath: LM nl, LAD 53m, LCX min irregs, RCA mild diff dzs. PA 34/15(21), PCWP 15. CO 5.0 L/min. EF 25-35%, glob HK.   Chronic heart failure with preserved ejection fraction (HFpEF) (HCC)    a. 06/2018 Echo: EF 55-60%; b. 03/2019 Echo: EF 40-45%; c. 05/2020 Echo: EF 35-40%; d. 12/2020 Echo: EF 35-40%; e. 03/2021 Echo: EF 35-40%. mod LVH,GrI DD, nl RV fxn, mod dil LA/RA, mild MR/AS.   CLL (chronic lymphocytic leukemia) (HCC) 07/21/2011   Migraines    NICM (nonischemic cardiomyopathy) (HCC)    PAF (paroxysmal atrial fibrillation) (HCC)    a. Dx 06/2018 in setting of COPD flare-->bb/eliquis (CHA2DS2VASc = 6); b. 02/2020 Zio: Predominantly sinus rhythm at an average heart rate of 60 (39-190).  3% atrial fibrillation burden and average of 127 (59-1 and 37), longest lasting 9 hours and 23 minutes.  43 runs of PSVT noted.  3 runs of nonsustained VT noted.   Pneumonia    PSVT (paroxysmal supraventricular tachycardia) (HCC)    a. 02/2020 Zio: 43 rounds of symptomatic PSVT.  Longest 19 beats; fastest 164 bpm x 14 beats.    Past Surgical History:  Procedure Laterality Date   ABDOMINAL HYSTERECTOMY     APPENDECTOMY     CATARACT EXTRACTION Bilateral 2023   CHOLECYSTECTOMY     LAPAROTOMY N/A 06/08/2015   Procedure: EXPLORATORY LAPAROTOMY;  Surgeon: Franky Macho, MD;  Location: [facility];  Service: General;  Laterality: N/A;   LYSIS OF ADHESION N/A 06/08/2015   Procedure: LYSIS OF ADHESION;  Surgeon: Franky Macho, MD;  Location: [facility];  Service: General;  Laterality: N/A;   MASTECTOMY     right   RIGHT/LEFT HEART CATH AND CORONARY ANGIOGRAPHY N/A 07/09/2020   Procedure: RIGHT/LEFT HEART CATH AND CORONARY ANGIOGRAPHY;  Surgeon: Yvonne Kendall, MD;  Location: [facility];  Service: Cardiovascular;  Laterality: N/A;   TOTAL HIP ARTHROPLASTY     right   Social History   Tobacco Use   Smoking status: Former    Current packs/day: 0.00    Types: Cigarettes    Quit date: 10/26/1978    Years since quitting: 44.7   Smokeless tobacco: Never  Vaping Use   Vaping status: Never Used  Substance Use Topics   Alcohol use: No   Drug use: No   Family History  Problem Relation Age of Onset   Cancer Brother    Cancer Other    Allergies  Allergen Reactions   Codeine Nausea And Vomiting    headache  Meclizine Other (See Comments)    Made dizziness worse.   Sulfa Antibiotics Nausea And Vomiting    Stomach upset   Entresto [Sacubitril-Valsartan]    Jardiance [Empagliflozin]    Zoloft [Sertraline Hcl] Anxiety   Review of Systems  Constitutional:  Negative for chills and fever.  HENT:  Negative for sore throat.   Respiratory:  Negative for cough and shortness of breath.   Cardiovascular:  Negative for chest pain, palpitations and leg swelling.  Gastrointestinal:  Negative for abdominal pain, blood in stool, constipation, diarrhea, nausea and vomiting.  Genitourinary:  Negative for dysuria and hematuria.  Musculoskeletal:  Negative for myalgias.  Skin:  Negative for itching and rash.  Neurological:   Negative for dizziness and headaches.  Psychiatric/Behavioral:  Negative for depression and suicidal ideas.      Objective:     BP 121/81 (BP Location: Left Arm, Patient Position: Sitting, Cuff Size: Normal)   Pulse 63   Ht 5\' 1"  (1.549 m)   Wt 150 lb 12.8 oz (68.4 kg)   SpO2 96%   BMI 28.49 kg/m  BP Readings from Last 3 Encounters:  07/07/23 121/81  05/11/23 (!) 100/54  03/31/23 100/63   Physical Exam Vitals reviewed.  Constitutional:      General: She is not in acute distress.    Appearance: Normal appearance. She is not toxic-appearing.  HENT:     Head: Normocephalic and atraumatic.     Right Ear: External ear normal.     Left Ear: External ear normal.     Nose: Nose normal. No congestion or rhinorrhea.     Mouth/Throat:     Mouth: Mucous membranes are moist.     Pharynx: Oropharynx is clear. No oropharyngeal exudate or posterior oropharyngeal erythema.  Eyes:     General: No scleral icterus.    Extraocular Movements: Extraocular movements intact.     Conjunctiva/sclera: Conjunctivae normal.     Pupils: Pupils are equal, round, and reactive to light.  Cardiovascular:     Rate and Rhythm: Normal rate and regular rhythm.     Pulses: Normal pulses.     Heart sounds: Normal heart sounds. No murmur heard.    No friction rub. No gallop.  Pulmonary:     Effort: Pulmonary effort is normal.     Breath sounds: Normal breath sounds. No wheezing, rhonchi or rales.  Abdominal:     General: Abdomen is flat. Bowel sounds are normal. There is no distension.     Palpations: Abdomen is soft.     Tenderness: There is no abdominal tenderness.  Musculoskeletal:        General: No swelling. Normal range of motion.     Cervical back: Normal range of motion.     Right lower leg: No edema.     Left lower leg: No edema.  Lymphadenopathy:     Cervical: No cervical adenopathy.  Skin:    General: Skin is warm and dry.     Capillary Refill: Capillary refill takes less than 2 seconds.      Coloration: Skin is not jaundiced.  Neurological:     General: No focal deficit present.     Mental Status: She is alert and oriented to person, place, and time.     Gait: Gait abnormal (Ambulates with cane).  Psychiatric:        Mood and Affect: Mood normal.        Behavior: Behavior normal.   Last CBC Lab Results  Component Value  Date   WBC 28.9 (H) 05/04/2023   HGB 10.3 (L) 05/04/2023   HCT 32.7 (L) 05/04/2023   MCV 96.5 05/04/2023   MCH 30.4 05/04/2023   RDW 14.7 05/04/2023   PLT 257 05/04/2023   Last metabolic panel Lab Results  Component Value Date   GLUCOSE 132 (H) 05/04/2023   NA 138 05/04/2023   K 4.1 05/04/2023   CL 107 05/04/2023   CO2 19 (L) 05/04/2023   BUN 25 (H) 05/04/2023   CREATININE 1.25 (H) 05/04/2023   GFRNONAA 42 (L) 05/04/2023   CALCIUM 9.1 05/04/2023   PHOS 3.4 05/24/2022   PROT 6.7 05/04/2023   ALBUMIN 4.2 05/04/2023   BILITOT 0.8 05/04/2023   ALKPHOS 49 05/04/2023   AST 21 05/04/2023   ALT 11 05/04/2023   ANIONGAP 12 05/04/2023   Last lipids Lab Results  Component Value Date   CHOL 124 02/07/2023   HDL 61 02/07/2023   LDLCALC 48 02/07/2023   TRIG 73 02/07/2023   CHOLHDL 2.0 02/07/2023   Last hemoglobin A1c Lab Results  Component Value Date   HGBA1C 5.7 (H) 11/15/2021   Last thyroid functions Lab Results  Component Value Date   TSH 2.056 11/15/2021   Last vitamin D Lab Results  Component Value Date   VD25OH 36.19 05/04/2023   Last vitamin B12 and Folate Lab Results  Component Value Date   VITAMINB12 281 10/07/2020   FOLATE 11.6 10/07/2020     Assessment & Plan:   Problem List Items Addressed This Visit       Paroxysmal atrial fibrillation (HCC) - Primary   Regular rate and rhythm detected on exam today.  She remains on Eliquis 5 mg twice daily and bisoprolol.  No medication changes are indicated today.  Recently seen by cardiology for follow-up.      Chronic combined systolic and diastolic CHF (congestive  heart failure) (HCC)   Remains euvolemic on exam.  On appropriate GDMT.  Recently seen by cardiology for follow-up.      Hot flashes   Veozah remains effective for symptom relief.      COPD with chronic bronchitis (HCC)   Asymptomatic currently.  Pulmonary exam is unremarkable.  Requires albuterol seldomly.      Mixed hyperlipidemia   Currently prescribed atorvastatin 20 mg daily.  Lipid panel updated in September 2024 reflects adequate control.      CLL (chronic lymphocytic leukemia) (HCC)   Recently seen by oncology for follow-up.  Cell counts stable.  Oncology follow-up scheduled for June.      Iron deficiency   Recently started oral iron supplementation in the setting of borderline iron deficiency per oncology recommendation.      Dizziness   She has previously endorsed symptoms of dizziness that have been attributed to orthostasis.  Spironolactone recently reduced to 12.5 mg daily and symptoms have improved. Will check orthostatic vital signs today and forward results to cardiology.       Return in about 3 months (around 10/04/2023).    Billie Lade, MD

## 2023-07-07 NOTE — Telephone Encounter (Signed)
Noted. Previous message routed to provider.

## 2023-07-07 NOTE — Assessment & Plan Note (Signed)
Currently prescribed atorvastatin 20 mg daily.  Lipid panel updated in September 2024 reflects adequate control.

## 2023-07-07 NOTE — Assessment & Plan Note (Signed)
She has previously endorsed symptoms of dizziness that have been attributed to orthostasis.  Spironolactone recently reduced to 12.5 mg daily and symptoms have improved. Will check orthostatic vital signs today and forward results to cardiology.

## 2023-07-07 NOTE — Telephone Encounter (Signed)
Contacted to see if she could come this morning for nurse visit do orthostatic blood pressures. Reports she didn't know she had a nurse visit scheduled today but did know she was scheduled to see her PCP today at 1:40 pm. Request if she could have her PCP office check orthostatics while there. Advised she would have to make that request. Advised she does have an appointment here today at 3:45 pm. Says she will call Corrie Dandy, who provides transportation, then will contact office if she is able to come for nurse visit today at 3:45 pm.

## 2023-07-10 ENCOUNTER — Other Ambulatory Visit: Payer: Self-pay | Admitting: Internal Medicine

## 2023-07-14 ENCOUNTER — Other Ambulatory Visit: Payer: Self-pay | Admitting: Internal Medicine

## 2023-07-14 DIAGNOSIS — I480 Paroxysmal atrial fibrillation: Secondary | ICD-10-CM

## 2023-07-14 NOTE — Telephone Encounter (Signed)
Prescription refill request for Eliquis received. Indication: Afib  Last office visit: 06/09/23 [name])  Scr: 1.25 (05/04/23)  Age: 88 Weight: 68.4kg  Appropriate dose. Refill sent.

## 2023-07-21 ENCOUNTER — Other Ambulatory Visit: Payer: Self-pay | Admitting: Nurse Practitioner

## 2023-07-21 NOTE — Telephone Encounter (Signed)
 This is a [place] pt.

## 2023-08-25 ENCOUNTER — Other Ambulatory Visit: Payer: Self-pay | Admitting: Nurse Practitioner

## 2023-09-07 ENCOUNTER — Other Ambulatory Visit: Payer: Self-pay | Admitting: Internal Medicine

## 2023-10-01 NOTE — Progress Notes (Deleted)
 [name]

## 2023-10-03 ENCOUNTER — Ambulatory Visit: Payer: Medicare HMO | Admitting: Internal Medicine

## 2023-10-09 ENCOUNTER — Ambulatory Visit (INDEPENDENT_AMBULATORY_CARE_PROVIDER_SITE_OTHER): Payer: Medicare HMO | Admitting: Internal Medicine

## 2023-10-09 ENCOUNTER — Telehealth: Payer: Self-pay | Admitting: Pharmacy Technician

## 2023-10-09 ENCOUNTER — Other Ambulatory Visit (HOSPITAL_COMMUNITY): Payer: Self-pay

## 2023-10-09 ENCOUNTER — Encounter: Payer: Self-pay | Admitting: Internal Medicine

## 2023-10-09 VITALS — BP 111/75 | HR 64 | Ht 61.0 in | Wt 149.8 lb

## 2023-10-09 DIAGNOSIS — H9191 Unspecified hearing loss, right ear: Secondary | ICD-10-CM

## 2023-10-09 DIAGNOSIS — R232 Flushing: Secondary | ICD-10-CM

## 2023-10-09 DIAGNOSIS — I480 Paroxysmal atrial fibrillation: Secondary | ICD-10-CM | POA: Diagnosis not present

## 2023-10-09 DIAGNOSIS — E8941 Symptomatic postprocedural ovarian failure: Secondary | ICD-10-CM | POA: Diagnosis not present

## 2023-10-09 DIAGNOSIS — E782 Mixed hyperlipidemia: Secondary | ICD-10-CM | POA: Diagnosis not present

## 2023-10-09 DIAGNOSIS — I5042 Chronic combined systolic (congestive) and diastolic (congestive) heart failure: Secondary | ICD-10-CM

## 2023-10-09 DIAGNOSIS — J4489 Other specified chronic obstructive pulmonary disease: Secondary | ICD-10-CM

## 2023-10-09 MED ORDER — VEOZAH 45 MG PO TABS: 1.0000 | ORAL_TABLET | Freq: Every day | ORAL | 0 refills | Status: DC

## 2023-10-09 NOTE — Progress Notes (Signed)
 Established Patient Office Visit  Subjective   Patient ID: [name], female    DOB: 1936-02-28  Age: 88 y.o. MRN: [id number]  Chief Complaint  Patient presents with   Care Management    Three month follow up    Referral    Referral for hearing loss in right ear   [name] returns to care today for routine follow-up.  She was last evaluated by me on 2/14.  No medication changes were made at that time and 51-month follow-up was arranged.  There have been no acute interval events.  Today she reports feeling fairly well.  She endorses hearing loss of the right ear for the past year and would like to undergo a hearing assessment.  She does not have any additional concerns to discuss.  Past Medical History:  Diagnosis Date   Bowel obstruction (HCC)    Breast cancer (HCC)    Breast cancer, stage 3 (HCC) 07/21/2011   Bronchitis    CAD (coronary artery disease)    a. 06/2020 Cath: LM nl, LAD 28m, LCX min irregs, RCA mild diff dzs. PA 34/15(21), PCWP 15. CO 5.0 L/min. EF 25-35%, glob HK.   Chronic heart failure with preserved ejection fraction (HFpEF) (HCC)    a. 06/2018 Echo: EF 55-60%; b. 03/2019 Echo: EF 40-45%; c. 05/2020 Echo: EF 35-40%; d. 12/2020 Echo: EF 35-40%; e. 03/2021 Echo: EF 35-40%. mod LVH,GrI DD, nl RV fxn, mod dil LA/RA, mild MR/AS.   CLL (chronic lymphocytic leukemia) (HCC) 07/21/2011   Migraines    NICM (nonischemic cardiomyopathy) (HCC)    PAF (paroxysmal atrial fibrillation) (HCC)    a. Dx 06/2018 in setting of COPD flare-->bb/eliquis  (CHA2DS2VASc = 6); b. 02/2020 Zio: Predominantly sinus rhythm at an average heart rate of 60 (39-190).  3% atrial fibrillation burden and average of 127 (59-1 and 37), longest lasting 9 hours and 23 minutes.  43 runs of PSVT noted.  3 runs of nonsustained VT noted.   Pneumonia    PSVT (paroxysmal supraventricular tachycardia) (HCC)    a. 02/2020 Zio: 43 rounds of symptomatic PSVT.  Longest 19 beats; fastest 164 bpm x 14 beats.    Past Surgical History:  Procedure Laterality Date   ABDOMINAL HYSTERECTOMY     APPENDECTOMY     CATARACT EXTRACTION Bilateral 2023   CHOLECYSTECTOMY     LAPAROTOMY N/A 06/08/2015   Procedure: EXPLORATORY LAPAROTOMY;  Surgeon: Alanda Allegra, MD;  Location: [facility];  Service: General;  Laterality: N/A;   LYSIS OF ADHESION N/A 06/08/2015   Procedure: LYSIS OF ADHESION;  Surgeon: Alanda Allegra, MD;  Location: [facility];  Service: General;  Laterality: N/A;   MASTECTOMY     right   RIGHT/LEFT HEART CATH AND CORONARY ANGIOGRAPHY N/A 07/09/2020   Procedure: RIGHT/LEFT HEART CATH AND CORONARY ANGIOGRAPHY;  Surgeon: Sammy Crisp, MD;  Location: [facility];  Service: Cardiovascular;  Laterality: N/A;   TOTAL HIP ARTHROPLASTY     right   Social History   Tobacco Use   Smoking status: Former    Current packs/day: 0.00    Types: Cigarettes    Quit date: 10/26/1978    Years since quitting: 45.0   Smokeless tobacco: Never  Vaping Use   Vaping status: Never Used  Substance Use Topics   Alcohol use: No   Drug use: No   Family History  Problem Relation Age of Onset   Cancer Brother    Cancer Other    Allergies  Allergen  Reactions   Albuterol  Other (See Comments)    Pt stated after she used the albuterol  nebulizer solution on 10-14-2023 she had a very bad headache that didn't go away for 4 days even with taking Tylenol  to try to help   Codeine Nausea And Vomiting    Headache    Entresto [Sacubitril-Valsartan] Other (See Comments)    Unknown    Jardiance  [Empagliflozin ] Other (See Comments)    Unknown    Meclizine Other (See Comments)    Made dizziness worse.   Sulfa Antibiotics Nausea And Vomiting    Stomach upset   Zoloft [Sertraline Hcl] Anxiety   Review of Systems  Constitutional:  Negative for chills and fever.  HENT:  Positive for hearing loss (Right ear). Negative for sore throat.   Respiratory:  Negative for cough and shortness of breath.   Cardiovascular:   Negative for chest pain, palpitations and leg swelling.  Gastrointestinal:  Negative for abdominal pain, blood in stool, constipation, diarrhea, nausea and vomiting.  Genitourinary:  Negative for dysuria and hematuria.  Musculoskeletal:  Negative for myalgias.  Skin:  Negative for itching and rash.  Neurological:  Negative for dizziness and headaches.  Psychiatric/Behavioral:  Negative for depression and suicidal ideas.      Objective:     BP 111/75   Pulse 64   Ht 5\' 1"  (1.549 m)   Wt 149 lb 12.8 oz (67.9 kg)   SpO2 92%   BMI 28.30 kg/m  BP Readings from Last 3 Encounters:  10/23/23 (!) 108/54  10/09/23 111/75  07/07/23 121/81   Physical Exam Vitals reviewed.  Constitutional:      General: She is not in acute distress.    Appearance: Normal appearance. She is not toxic-appearing.  HENT:     Head: Normocephalic and atraumatic.     Right Ear: Tympanic membrane, ear canal and external ear normal.     Left Ear: Tympanic membrane, ear canal and external ear normal.     Nose: Nose normal. No congestion or rhinorrhea.     Mouth/Throat:     Mouth: Mucous membranes are moist.     Pharynx: Oropharynx is clear. No oropharyngeal exudate or posterior oropharyngeal erythema.  Eyes:     General: No scleral icterus.    Extraocular Movements: Extraocular movements intact.     Conjunctiva/sclera: Conjunctivae normal.     Pupils: Pupils are equal, round, and reactive to light.  Cardiovascular:     Rate and Rhythm: Normal rate and regular rhythm.     Pulses: Normal pulses.     Heart sounds: Normal heart sounds. No murmur heard.    No friction rub. No gallop.  Pulmonary:     Effort: Pulmonary effort is normal.     Breath sounds: Normal breath sounds. No wheezing, rhonchi or rales.  Abdominal:     General: Abdomen is flat. Bowel sounds are normal. There is no distension.     Palpations: Abdomen is soft.     Tenderness: There is no abdominal tenderness.  Musculoskeletal:         General: No swelling. Normal range of motion.     Cervical back: Normal range of motion.     Right lower leg: No edema.     Left lower leg: No edema.  Lymphadenopathy:     Cervical: No cervical adenopathy.  Skin:    General: Skin is warm and dry.     Capillary Refill: Capillary refill takes less than 2 seconds.  Coloration: Skin is not jaundiced.  Neurological:     General: No focal deficit present.     Mental Status: She is alert and oriented to person, place, and time.     Gait: Gait abnormal (Ambulates with cane).  Psychiatric:        Mood and Affect: Mood normal.        Behavior: Behavior normal.   Last CBC Lab Results  Component Value Date   WBC 14.2 (H) 10/23/2023   HGB 9.5 (L) 10/23/2023   HCT 29.8 (L) 10/23/2023   MCV 95.8 10/23/2023   MCH 30.5 10/23/2023   RDW 15.8 (H) 10/23/2023   PLT 235 10/23/2023   Last metabolic panel Lab Results  Component Value Date   GLUCOSE 115 (H) 10/23/2023   NA 141 10/23/2023   K 4.3 10/23/2023   CL 107 10/23/2023   CO2 24 10/23/2023   BUN 24 (H) 10/23/2023   CREATININE 0.99 10/23/2023   GFRNONAA 56 (L) 10/23/2023   CALCIUM  9.0 10/23/2023   PHOS 3.7 10/22/2023   PROT 5.9 (L) 10/22/2023   ALBUMIN 3.8 10/22/2023   BILITOT 0.9 10/22/2023   ALKPHOS 45 10/22/2023   AST 21 10/22/2023   ALT 12 10/22/2023   ANIONGAP 10 10/23/2023   Last lipids Lab Results  Component Value Date   CHOL 124 02/07/2023   HDL 61 02/07/2023   LDLCALC 48 02/07/2023   TRIG 73 02/07/2023   CHOLHDL 2.0 02/07/2023   Last hemoglobin A1c Lab Results  Component Value Date   HGBA1C 5.7 (H) 11/15/2021   Last thyroid  functions Lab Results  Component Value Date   TSH 2.056 11/15/2021   Last vitamin D  Lab Results  Component Value Date   VD25OH 36.19 05/04/2023   Last vitamin B12 and Folate Lab Results  Component Value Date   VITAMINB12 281 10/07/2020   FOLATE 11.6 10/07/2020     Assessment & Plan:   Problem List Items Addressed This  Visit       Paroxysmal atrial fibrillation (HCC)   Regular rate and rhythm on exam again today.  She remains on Eliquis  and bisoprolol .  Cardiology follow-up is scheduled for July.      Chronic combined systolic and diastolic CHF (congestive heart failure) (HCC)   She remains euvolemic on exam today.  Cardiology follow-up is scheduled for July.      Hot flashes   She continues to experience hot flashes regularly.  Veozah  has previously been effective for symptom relief, however she is currently off Veozah  due to a lack of insurance coverage.  She has instead been using black cohosh. -Resume Veozah .  New prescription sent today.      COPD with chronic bronchitis (HCC)   Stable.  Pulmonary exam unremarkable.  She continues to use albuterol  infrequently.      Hearing loss of right ear   Her acute concern today is hearing loss of the right ear x 1 year.  Audiology referral placed today for further evaluation.      Mixed hyperlipidemia   Lipid panel updated in September 2024 reflects adequate control with atorvastatin  20 mg daily.      Return in about 3 months (around 01/09/2024).   Tobi Fortes, MD

## 2023-10-09 NOTE — Patient Instructions (Signed)
 It was a pleasure to see you today.  Thank you for giving us  the opportunity to be involved in your care.  Below is a brief recap of your visit and next steps.  We will plan to see you again in 3 months.  Summary Resume Veozah  Audiology referral for hearing assessment Follow up in 3 months

## 2023-10-09 NOTE — Telephone Encounter (Signed)
 Pharmacy Patient Advocate Encounter  Received notification from [facility] that Prior Authorization for Veozah  45MG  tablets has been DENIED.       PA #/Case ID/Reference #: Key: [id number]  Called [facility] and was advised that this med was last filled on 06/08/2023 and they received a PA denial on 07/11/2023. Pt has not been taking.

## 2023-10-10 ENCOUNTER — Telehealth: Payer: Self-pay | Admitting: Pharmacist

## 2023-10-10 NOTE — Telephone Encounter (Signed)
 Appeal has been submitted for Veozah . Will advise when response is received, please be advised that most companies may take 30 days to make a decision. Appeal letter and supporting information submitted via fax to [phone] on 10/10/2023 @11 :14 am.  Thank you, Dene Fines, PharmD Clinical Pharmacist  [place]  Direct Dial: [phone]

## 2023-10-17 ENCOUNTER — Other Ambulatory Visit (HOSPITAL_COMMUNITY): Payer: Self-pay

## 2023-10-17 NOTE — Telephone Encounter (Signed)
 The insurance has approved Veozah  through 05/22/2024:    Thank you, Dene Fines, PharmD Clinical Pharmacist  [place]  Direct Dial: [phone]

## 2023-10-21 ENCOUNTER — Other Ambulatory Visit: Payer: Self-pay

## 2023-10-21 ENCOUNTER — Emergency Department (HOSPITAL_COMMUNITY)

## 2023-10-21 ENCOUNTER — Inpatient Hospital Stay (HOSPITAL_COMMUNITY)
Admission: EM | Admit: 2023-10-21 | Discharge: 2023-10-23 | DRG: 292 | Disposition: A | Discharge status: Home / Self Care | Attending: Internal Medicine | Admitting: Internal Medicine

## 2023-10-21 ENCOUNTER — Encounter (HOSPITAL_COMMUNITY): Payer: Self-pay | Admitting: Emergency Medicine

## 2023-10-21 DIAGNOSIS — L03116 Cellulitis of left lower limb: Secondary | ICD-10-CM

## 2023-10-21 DIAGNOSIS — I2510 Atherosclerotic heart disease of native coronary artery without angina pectoris: Secondary | ICD-10-CM

## 2023-10-21 DIAGNOSIS — I728 Aneurysm of other specified arteries: Secondary | ICD-10-CM | POA: Diagnosis not present

## 2023-10-21 DIAGNOSIS — W57XXXA Bitten or stung by nonvenomous insect and other nonvenomous arthropods, initial encounter: Secondary | ICD-10-CM | POA: Diagnosis present

## 2023-10-21 DIAGNOSIS — R0600 Dyspnea, unspecified: Secondary | ICD-10-CM | POA: Diagnosis not present

## 2023-10-21 DIAGNOSIS — Z7901 Long term (current) use of anticoagulants: Secondary | ICD-10-CM

## 2023-10-21 DIAGNOSIS — I083 Combined rheumatic disorders of mitral, aortic and tricuspid valves: Secondary | ICD-10-CM | POA: Diagnosis not present

## 2023-10-21 DIAGNOSIS — I509 Heart failure, unspecified: Secondary | ICD-10-CM | POA: Diagnosis not present

## 2023-10-21 DIAGNOSIS — Z96641 Presence of right artificial hip joint: Secondary | ICD-10-CM | POA: Diagnosis not present

## 2023-10-21 DIAGNOSIS — N2889 Other specified disorders of kidney and ureter: Secondary | ICD-10-CM | POA: Diagnosis not present

## 2023-10-21 DIAGNOSIS — J449 Chronic obstructive pulmonary disease, unspecified: Secondary | ICD-10-CM | POA: Diagnosis not present

## 2023-10-21 DIAGNOSIS — T502X5A Adverse effect of carbonic-anhydrase inhibitors, benzothiadiazides and other diuretics, initial encounter: Secondary | ICD-10-CM | POA: Diagnosis not present

## 2023-10-21 DIAGNOSIS — I4819 Other persistent atrial fibrillation: Secondary | ICD-10-CM

## 2023-10-21 DIAGNOSIS — I447 Left bundle-branch block, unspecified: Secondary | ICD-10-CM | POA: Diagnosis not present

## 2023-10-21 DIAGNOSIS — E876 Hypokalemia: Secondary | ICD-10-CM | POA: Diagnosis not present

## 2023-10-21 DIAGNOSIS — G43909 Migraine, unspecified, not intractable, without status migrainosus: Secondary | ICD-10-CM | POA: Diagnosis not present

## 2023-10-21 DIAGNOSIS — E782 Mixed hyperlipidemia: Secondary | ICD-10-CM

## 2023-10-21 DIAGNOSIS — I517 Cardiomegaly: Secondary | ICD-10-CM | POA: Diagnosis not present

## 2023-10-21 DIAGNOSIS — I428 Other cardiomyopathies: Secondary | ICD-10-CM | POA: Diagnosis present

## 2023-10-21 DIAGNOSIS — I5043 Acute on chronic combined systolic (congestive) and diastolic (congestive) heart failure: Principal | ICD-10-CM

## 2023-10-21 DIAGNOSIS — M25562 Pain in left knee: Secondary | ICD-10-CM | POA: Diagnosis not present

## 2023-10-21 DIAGNOSIS — I110 Hypertensive heart disease with heart failure: Secondary | ICD-10-CM | POA: Diagnosis not present

## 2023-10-21 DIAGNOSIS — R0602 Shortness of breath: Secondary | ICD-10-CM | POA: Diagnosis not present

## 2023-10-21 DIAGNOSIS — Z79899 Other long term (current) drug therapy: Secondary | ICD-10-CM | POA: Diagnosis not present

## 2023-10-21 DIAGNOSIS — Z882 Allergy status to sulfonamides status: Secondary | ICD-10-CM | POA: Diagnosis not present

## 2023-10-21 DIAGNOSIS — E041 Nontoxic single thyroid nodule: Secondary | ICD-10-CM | POA: Diagnosis not present

## 2023-10-21 DIAGNOSIS — Z856 Personal history of leukemia: Secondary | ICD-10-CM

## 2023-10-21 DIAGNOSIS — K219 Gastro-esophageal reflux disease without esophagitis: Secondary | ICD-10-CM

## 2023-10-21 DIAGNOSIS — Z9882 Breast implant status: Secondary | ICD-10-CM

## 2023-10-21 DIAGNOSIS — Z888 Allergy status to other drugs, medicaments and biological substances status: Secondary | ICD-10-CM

## 2023-10-21 DIAGNOSIS — Z885 Allergy status to narcotic agent status: Secondary | ICD-10-CM | POA: Diagnosis not present

## 2023-10-21 DIAGNOSIS — I5030 Unspecified diastolic (congestive) heart failure: Secondary | ICD-10-CM | POA: Diagnosis not present

## 2023-10-21 DIAGNOSIS — Z87891 Personal history of nicotine dependence: Secondary | ICD-10-CM | POA: Diagnosis not present

## 2023-10-21 DIAGNOSIS — Z90710 Acquired absence of both cervix and uterus: Secondary | ICD-10-CM

## 2023-10-21 DIAGNOSIS — Z853 Personal history of malignant neoplasm of breast: Secondary | ICD-10-CM | POA: Diagnosis not present

## 2023-10-21 LAB — CBC WITH DIFFERENTIAL/PLATELET
Abs Immature Granulocytes: 0.02 10*3/uL (ref 0.00–0.07)
Basophils Absolute: 0.1 10*3/uL (ref 0.0–0.1)
Basophils Relative: 1 %
Eosinophils Absolute: 0.1 10*3/uL (ref 0.0–0.5)
Eosinophils Relative: 0 %
HCT: 28.2 % — ABNORMAL LOW (ref 36.0–46.0)
Hemoglobin: 9.3 g/dL — ABNORMAL LOW (ref 12.0–15.0)
Immature Granulocytes: 0 %
Lymphocytes Relative: 59 %
Lymphs Abs: 6.9 10*3/uL — ABNORMAL HIGH (ref 0.7–4.0)
MCH: 32.2 pg (ref 26.0–34.0)
MCHC: 33 g/dL (ref 30.0–36.0)
MCV: 97.6 fL (ref 80.0–100.0)
Monocytes Absolute: 0.6 10*3/uL (ref 0.1–1.0)
Monocytes Relative: 5 %
Neutro Abs: 4.1 10*3/uL (ref 1.7–7.7)
Neutrophils Relative %: 35 %
Platelets: 186 10*3/uL (ref 150–400)
RBC: 2.89 MIL/uL — ABNORMAL LOW (ref 3.87–5.11)
RDW: 15.8 % — ABNORMAL HIGH (ref 11.5–15.5)
WBC: 12.1 10*3/uL — ABNORMAL HIGH (ref 4.0–10.5)
nRBC: 0 % (ref 0.0–0.2)

## 2023-10-21 LAB — COMPREHENSIVE METABOLIC PANEL WITH GFR
ALT: 10 U/L (ref 0–44)
AST: 19 U/L (ref 15–41)
Albumin: 3.6 g/dL (ref 3.5–5.0)
Alkaline Phosphatase: 44 U/L (ref 38–126)
Anion gap: 11 (ref 5–15)
BUN: 20 mg/dL (ref 8–23)
CO2: 22 mmol/L (ref 22–32)
Calcium: 9.3 mg/dL (ref 8.9–10.3)
Chloride: 109 mmol/L (ref 98–111)
Creatinine, Ser: 0.7 mg/dL (ref 0.44–1.00)
GFR, Estimated: >60 mL/min (ref >60)
Glucose, Bld: 108 mg/dL — ABNORMAL HIGH (ref 70–99)
Potassium: 3.6 mmol/L (ref 3.5–5.1)
Sodium: 142 mmol/L (ref 135–145)
Total Bilirubin: 0.9 mg/dL (ref 0.0–1.2)
Total Protein: 5.9 g/dL — ABNORMAL LOW (ref 6.5–8.1)

## 2023-10-21 LAB — BRAIN NATRIURETIC PEPTIDE: B Natriuretic Peptide: 755 pg/mL — ABNORMAL HIGH (ref 0.0–100.0)

## 2023-10-21 LAB — TROPONIN I (HIGH SENSITIVITY)
Troponin I (High Sensitivity): 6 ng/L (ref <18)
Troponin I (High Sensitivity): 6 ng/L (ref <18)

## 2023-10-21 MED ORDER — IPRATROPIUM-ALBUTEROL 0.5-2.5 (3) MG/3ML IN SOLN
3.0000 mL | Freq: Once | RESPIRATORY_TRACT | Status: AC
  Administered 2023-10-21: 3 mL via RESPIRATORY_TRACT
  Filled 2023-10-21: qty 3

## 2023-10-21 MED ORDER — IOHEXOL 350 MG/ML SOLN
75.0000 mL | Freq: Once | INTRAVENOUS | Status: AC | PRN
  Administered 2023-10-21: 75 mL via INTRAVENOUS

## 2023-10-21 MED ORDER — FUROSEMIDE 10 MG/ML IJ SOLN
20.0000 mg | Freq: Once | INTRAMUSCULAR | Status: AC
  Administered 2023-10-21: 20 mg via INTRAVENOUS
  Filled 2023-10-21: qty 2

## 2023-10-21 NOTE — ED Triage Notes (Addendum)
 Pt to ER reports SHOB that started approximately 3 hours ago while walking from the store to the parking lot. Pt states used albuterol  MDI without relief.  States [facility] worsens with exertion. Denies chest pain, nausea or other associated symptoms. Pt talking in complete uninterrupted sentences.

## 2023-10-21 NOTE — ED Provider Notes (Signed)
 [facility] MEDICAL SURGICAL UNIT Provider Note   CSN: [id number] Arrival date & time: 10/21/23  1823     History  Chief Complaint  Patient presents with   Shortness of Breath    [name] is a 88 y.o. female.  Patient is an 88 year old female who presents emergency department with a chief complaint of progressive shortness of breath over the past week that has become worse today.  She notes that the shortness of breath is worse with exertion.  She does have associated orthopnea.  Patient denies any increased edema in her lower extremities.  She is not currently on any diuretics at this time.  She does note that she has a history of COPD.  She does have a history of CHF as well.  Patient notes that she did use her breathing treatment at home with no improvement in her symptoms.  She denies any associated dizziness, lightheadedness or syncope.  Of note on presentation patient did have a tick present on the anterior aspect of the left lower extremity.   Shortness of Breath      Home Medications Prior to Admission medications   Medication Sig Start Date End Date Taking? Authorizing Provider  acetaminophen  (TYLENOL ) 500 MG tablet Take 1,000 mg by mouth every 6 (six) hours as needed for moderate pain or headache.    [provider]  albuterol  (PROVENTIL  HFA;VENTOLIN  HFA) 108 (90 BASE) MCG/ACT inhaler Inhale 2 puffs into the lungs every 6 (six) hours as needed for shortness of breath.    [provider]  albuterol  (PROVENTIL ) (2.5 MG/3ML) 0.083% nebulizer solution Take 3 mLs (2.5 mg total) by nebulization every 6 (six) hours as needed for wheezing or shortness of breath (when not relieved by inhaler.). 06/27/18   Justina Oman, MD  atorvastatin  (LIPITOR) 20 MG tablet TAKE ONE TABLET BY MOUTH ONCE DAILY. 07/21/23   Florette Hurry, NP  bisoprolol  (ZEBETA ) 5 MG tablet TAKE (1/2) TABLET BY MOUTH ONCE DAILY. 09/07/23   Elmyra Haggard, MD  cholecalciferol   (VITAMIN D3) 25 MCG (1000 UNIT) tablet Take 1 capsule by mouth daily. 04/27/21   Katragadda, Sreedhar, MD  diphenhydrAMINE (BENADRYL) 25 MG tablet Take 25 mg by mouth daily as needed for allergies.    [provider]  ELIQUIS  5 MG TABS tablet TAKE 1 TABLET BY MOUTH TWICE A DAY 07/14/23   Ross, Paula V, MD  ferrous sulfate 325 (65 FE) MG EC tablet Take 325 mg by mouth 3 (three) times daily with meals.    [provider]  losartan  (COZAAR ) 25 MG tablet TAKE (1/2) TABLET BY MOUTH DAILY 08/25/23   Elmyra Haggard, MD  Misc. 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 04/28/23   Tobi Fortes, MD  ondansetron  (ZOFRAN ) 4 MG tablet Take 1 tablet (4 mg total) by mouth every 8 (eight) hours as needed for nausea or vomiting. 05/13/21   Finis Hugger, Dimple Francis, PA-C  spironolactone  (ALDACTONE ) 25 MG tablet Take 0.5 tablets (12.5 mg total) by mouth daily. 06/09/23 09/07/23  Strader, Dimple Francis, PA-C  VEOZAH  45 MG TABS Take 1 tablet (45 mg total) by mouth daily. 10/09/23   Tobi Fortes, MD      Allergies    Codeine, Meclizine, Sulfa antibiotics, Entresto [sacubitril-valsartan], Jardiance  [empagliflozin ], and Zoloft [sertraline hcl]    Review of  Systems   Review of Systems  Respiratory:  Positive for shortness of breath.   All other systems reviewed and are negative.   Physical Exam Updated Vital Signs BP 125/72   Pulse 66   Temp 97.9 F (36.6 C) (Oral)   Resp 20   Ht 5\' 1"  (1.549 m)   Wt 68 kg   SpO2 95%   BMI 28.33 kg/m  Physical Exam Vitals and nursing note reviewed.  Constitutional:      Appearance: Normal appearance.  HENT:     Head: Normocephalic and atraumatic.     Nose: Nose normal.     Mouth/Throat:     Mouth: Mucous membranes are moist.  Eyes:     Extraocular Movements: Extraocular movements intact.      Conjunctiva/sclera: Conjunctivae normal.     Pupils: Pupils are equal, round, and reactive to light.  Cardiovascular:     Rate and Rhythm: Normal rate and regular rhythm.     Pulses: Normal pulses.     Heart sounds: Normal heart sounds.  Pulmonary:     Effort: Pulmonary effort is normal. No tachypnea.     Breath sounds: Rales present. No decreased breath sounds, wheezing or rhonchi.  Chest:     Chest wall: No tenderness.  Abdominal:     General: Abdomen is flat. Bowel sounds are normal.     Palpations: Abdomen is soft. There is no mass.     Tenderness: There is no abdominal tenderness.  Musculoskeletal:        General: Normal range of motion.     Cervical back: Normal range of motion and neck supple.     Right lower leg: Edema present.     Left lower leg: Edema present.     Comments: Tick present on anterior aspect of left lower extremity  Skin:    General: Skin is warm and dry.  Neurological:     General: No focal deficit present.     Mental Status: She is alert and oriented to person, place, and time. Mental status is at baseline.  Psychiatric:        Mood and Affect: Mood normal.        Behavior: Behavior normal.        Thought Content: Thought content normal.        Judgment: Judgment normal.     ED Results / Procedures / Treatments   Labs (all labs ordered are listed, but only abnormal results are displayed) Labs Reviewed  COMPREHENSIVE METABOLIC PANEL WITH GFR - Abnormal; Notable for the following components:      Result Value   Glucose, Bld 108 (*)    Total Protein 5.9 (*)    All other components within normal limits  BRAIN NATRIURETIC PEPTIDE - Abnormal; Notable for the following components:   B Natriuretic Peptide 755.0 (*)    All other components within normal limits  CBC WITH DIFFERENTIAL/PLATELET - Abnormal; Notable for the following components:   WBC 12.1 (*)    RBC 2.89 (*)    Hemoglobin 9.3 (*)    HCT 28.2 (*)    RDW 15.8 (*)    Lymphs Abs 6.9 (*)     All other components within normal limits  TROPONIN I (HIGH SENSITIVITY)  TROPONIN I (HIGH SENSITIVITY)    EKG None  Radiology CT Angio Chest PE W/Cm &/Or Wo Cm Result Date: 10/21/2023 CLINICAL DATA:  Pulmonary embolus suspected with high probability. Shortness of breath starting 3 hours ago. Worse with  exertion. EXAM: CT ANGIOGRAPHY CHEST WITH CONTRAST TECHNIQUE: Multidetector CT imaging of the chest was performed using the standard protocol during bolus administration of intravenous contrast. Multiplanar CT image reconstructions and MIPs were obtained to evaluate the vascular anatomy. RADIATION DOSE REDUCTION: This exam was performed according to the departmental dose-optimization program which includes automated exposure control, adjustment of the mA and/or kV according to patient size and/or use of iterative reconstruction technique. CONTRAST:  75mL OMNIPAQUE  IOHEXOL  350 MG/ML SOLN COMPARISON:  Chest radiograph 10/21/2023.  CT chest 04/07/2021 FINDINGS: Cardiovascular: Pending the adequate study with good opacification of the central and segmental pulmonary arteries. Moderate motion artifact. No focal filling defects are demonstrated. No evidence of significant pulmonary embolus. Cardiac enlargement. No pericardial effusions. Normal caliber thoracic aorta. No aortic dissection. Calcification of the aorta and coronary arteries. 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 Mediastinum/Nodes: Esophagus is decompressed. No significant lymphadenopathy. Left thyroid  gland nodule measuring 2.6 cm diameter. This is enlarging since prior study. Elective follow-up ultrasound recommended. Lungs/Pleura: Motion artifact. Interstitial changes in the lungs with interlobular septal thickening, progressing since prior study. This is most likely represent edema. No airspace disease or consolidation. No pleural effusion or pneumothorax. Upper Abdomen: Punctate sized  stones in the kidneys. No hydronephrosis. Surgical absence of the gallbladder. No acute abnormalities. Musculoskeletal: Degenerative changes in the spine. No acute bony abnormalities. Review of the MIP images confirms the above findings. IMPRESSION: 1. No evidence of significant pulmonary embolus. 2. Diffuse interstitial pattern to the lungs progressing since prior study, likely edema. 3. Cardiac enlargement. 4. Aortic atherosclerosis. 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 No change. 5. 2.6 cm left thyroid  nodule appears to be increasing since prior study. Elective follow-up ultrasound is recommended. 6. Nonobstructing intrarenal stones are incidentally noted. Electronically Signed   By: Boyce Byes M.D.   On: 10/21/2023 21:08   DG Chest 2 View Result Date: 10/21/2023 CLINICAL DATA:  Dyspnea. EXAM: CHEST - 2 VIEW COMPARISON:  Chest x-ray 03/11/2023. CT abdomen and pelvis 06/05/2015. FINDINGS: The heart is enlarged, unchanged. The lungs are clear. There is no pleural effusion or pneumothorax. No acute fractures are seen. Cholecystectomy clips are present. Right renal calcifications are again seen measuring up to 17 mm. IMPRESSION: 1. No active cardiopulmonary disease. 2. Cardiomegaly. 3. Stable right renal calcifications measuring up to 17 mm. Electronically Signed   By: Tyron Gallon M.D.   On: 10/21/2023 19:17    Procedures Procedures    Medications Ordered in ED Medications  furosemide  (LASIX ) injection 20 mg (has no administration in time range)  ipratropium-albuterol  (DUONEB) 0.5-2.5 (3) MG/3ML nebulizer solution 3 mL (3 mLs Nebulization Given 10/21/23 2016)  iohexol  (OMNIPAQUE ) 350 MG/ML injection 75 mL (75 mLs Intravenous Contrast Given 10/21/23 2050)    ED Course/ Medical Decision Making/ A&P                                 Medical Decision Making Amount and/or Complexity of Data Reviewed Labs: ordered. Radiology: ordered.  Risk Prescription drug  management. Decision regarding hospitalization.   This patient presents to the ED for concern of shortness of breath, this involves an extensive number of treatment options, and is a complaint that carries with it a high risk of complications and morbidity.  The differential diagnosis includes acute CHF, pulmonary embolus, COPD exacerbation, pneumonia, pneumothorax, hemothorax, sepsis   Co morbidities that complicate  the patient evaluation  CHF, COPD   Additional history obtained:  Additional history obtained from medical records External records from outside source obtained and reviewed including medical records   Lab Tests:  I Ordered, and personally interpreted labs.  The pertinent results include: Mild leukocytosis, anemia, normal electrolytes, normal kidney function liver function, elevated BNP, normal troponin   Imaging Studies ordered:  I ordered imaging studies including chest x-ray, CTA of chest I independently visualized and interpreted imaging which showed pulmonary edema and cardiomegaly I agree with the radiologist interpretation   Cardiac Monitoring: / EKG:  The patient was maintained on a cardiac monitor.  I personally viewed and interpreted the cardiac monitored which showed an underlying rhythm of: Normal sinus rhythm, left bundle branch block, no ST/T wave changes, no ischemic changes, no STEMI   Consultations Obtained:  I requested consultation with the hospitalist,  and discussed lab and imaging findings as well as pertinent plan - they recommend: Admission   Problem List / ED Course / Critical interventions / Medication management  Patient is doing well at this time and does remain stable.  Discussed with patient we will plan for admission to hospital service given her apparent pulmonary edema.  Will give dose of Lasix  in the emergency department.  Patient is not requiring any oxygen  supplementation at this point.  Patient had no indication of pulmonary  embolus noted on CTA of the chest.  Of note I did remove a tick from the anterior aspect of her left lower extremity which was engorged.  She did have some mild surrounding erythema with no indication for cellulitis.  Have discussed patient case with Dr. Adefeso with the hospitalist service who has accepted for admission at this time. I ordered medication including Lasix , DuoNeb for dyspnea, acute CHF Reevaluation of the patient after these medicines showed that the patient improved I have reviewed the patients home medicines and have made adjustments as needed   Social Determinants of Health:  None   Test / Admission - Considered:  Admission        Final Clinical Impression(s) / ED Diagnoses Final diagnoses:  Acute congestive heart failure, unspecified heart failure type [facility])    Rx / DC Orders ED Discharge Orders     None         Emmalene Hare 10/21/23 2254    Cheyenne Cotta, MD 10/22/23 1148

## 2023-10-21 NOTE — H&P (Incomplete)
 History and Physical    Patient: [name]:[id number] DOB: 03/11/1936 DOA: 10/21/2023 DOS: the patient was seen and examined on 10/22/2023 PCP: Tobi Fortes, MD  Patient coming from: Home  Chief Complaint:  Chief Complaint  Patient presents with   Shortness of Breath   HPI: [name] is a 88 y.o. female with medical history significant of chronic HFrEF, CAD, hyperlipidemia, paroxysmal atrial fibrillation, COPD, CLL who presents to the emergency department due to worsening shortness of breath that has been going for about a week.  This worsened today.  She complained of shortness of breath on exertion and complained of difficulty in being able to lay flat in bed.  She has been using home breathing treatment at home without improvement.  She then decided to go to the ED for further evaluation and management.  ED Course:  In the emergency department, she was hemodynamically stable.  Workup in ED showed WBC 12.1, hemoglobin 9.3, hematocrit 20.2, MCV 97.6, platelets 186.  BMP was normal except for blood glucose of 108, troponin times was flat at 6.  BNP 755. CT angiography chest with contrast showed no evidence of significant pulmonary embolus.Diffuse interstitial pattern to the lungs progressing since prior study, likely edema. Chest x-ray showed no active cardiopulmonary disease and aneurysm surgery event x 1 was given, breathing treatment was provided.  TRH was asked to admit patient.  Review of Systems: Review of systems as noted in the HPI. All other systems reviewed and are negative.   Past Medical History:  Diagnosis Date   Bowel obstruction (HCC)    Breast cancer (HCC)    Breast cancer, stage 3 (HCC) 07/21/2011   Bronchitis    CAD (coronary artery disease)    a. 06/2020 Cath: LM nl, LAD 27m, LCX min irregs, RCA mild diff dzs. PA 34/15(21), PCWP 15. CO 5.0 L/min. EF 25-35%, glob HK.   Chronic heart failure with preserved ejection fraction (HFpEF) (HCC)    a.  06/2018 Echo: EF 55-60%; b. 03/2019 Echo: EF 40-45%; c. 05/2020 Echo: EF 35-40%; d. 12/2020 Echo: EF 35-40%; e. 03/2021 Echo: EF 35-40%. mod LVH,GrI DD, nl RV fxn, mod dil LA/RA, mild MR/AS.   CLL (chronic lymphocytic leukemia) (HCC) 07/21/2011   Migraines    NICM (nonischemic cardiomyopathy) (HCC)    PAF (paroxysmal atrial fibrillation) (HCC)    a. Dx 06/2018 in setting of COPD flare-->bb/eliquis  (CHA2DS2VASc = 6); b. 02/2020 Zio: Predominantly sinus rhythm at an average heart rate of 60 (39-190).  3% atrial fibrillation burden and average of 127 (59-1 and 37), longest lasting 9 hours and 23 minutes.  43 runs of PSVT noted.  3 runs of nonsustained VT noted.   Pneumonia    PSVT (paroxysmal supraventricular tachycardia) (HCC)    a. 02/2020 Zio: 43 rounds of symptomatic PSVT.  Longest 19 beats; fastest 164 bpm x 14 beats.   Past Surgical History:  Procedure Laterality Date   ABDOMINAL HYSTERECTOMY     APPENDECTOMY     CATARACT EXTRACTION Bilateral 2023   CHOLECYSTECTOMY     LAPAROTOMY N/A 06/08/2015   Procedure: EXPLORATORY LAPAROTOMY;  Surgeon: Alanda Allegra, MD;  Location: [facility];  Service: General;  Laterality: N/A;   LYSIS OF ADHESION N/A 06/08/2015   Procedure: LYSIS OF ADHESION;  Surgeon: Alanda Allegra, MD;  Location: [facility];  Service: General;  Laterality: N/A;   MASTECTOMY     right   RIGHT/LEFT HEART CATH AND CORONARY ANGIOGRAPHY N/A 07/09/2020   Procedure: RIGHT/LEFT  HEART CATH AND CORONARY ANGIOGRAPHY;  Surgeon: Sammy Crisp, MD;  Location: [facility];  Service: Cardiovascular;  Laterality: N/A;   TOTAL HIP ARTHROPLASTY     right    Social History:  reports that she quit smoking about 45 years ago. Her smoking use included cigarettes. She has never used smokeless tobacco. She reports that she does not drink alcohol and does not use drugs.   Allergies  Allergen Reactions   Codeine Nausea And Vomiting    headache   Meclizine Other (See Comments)    Made dizziness  worse.   Sulfa Antibiotics Nausea And Vomiting    Stomach upset   Entresto [Sacubitril-Valsartan]    Jardiance  [Empagliflozin ]    Zoloft [Sertraline Hcl] Anxiety    Family History  Problem Relation Age of Onset   Cancer Brother    Cancer Other      Prior to Admission medications   Medication Sig Start Date End Date Taking? Authorizing Provider  acetaminophen  (TYLENOL ) 500 MG tablet Take 1,000 mg by mouth every 6 (six) hours as needed for moderate pain or headache.    [provider]  albuterol  (PROVENTIL  HFA;VENTOLIN  HFA) 108 (90 BASE) MCG/ACT inhaler Inhale 2 puffs into the lungs every 6 (six) hours as needed for shortness of breath.    [provider]  albuterol  (PROVENTIL ) (2.5 MG/3ML) 0.083% nebulizer solution Take 3 mLs (2.5 mg total) by nebulization every 6 (six) hours as needed for wheezing or shortness of breath (when not relieved by inhaler.). 06/27/18   Justina Oman, MD  atorvastatin  (LIPITOR) 20 MG tablet TAKE ONE TABLET BY MOUTH ONCE DAILY. 07/21/23   Florette Hurry, NP  bisoprolol  (ZEBETA ) 5 MG tablet TAKE (1/2) TABLET BY MOUTH ONCE DAILY. 09/07/23   Elmyra Haggard, MD  cholecalciferol  (VITAMIN D3) 25 MCG (1000 UNIT) tablet Take 1 capsule by mouth daily. 04/27/21   Katragadda, Sreedhar, MD  diphenhydrAMINE (BENADRYL) 25 MG tablet Take 25 mg by mouth daily as needed for allergies.    [provider]  ELIQUIS  5 MG TABS tablet TAKE 1 TABLET BY MOUTH TWICE A DAY 07/14/23   Ross, Paula V, MD  ferrous sulfate 325 (65 FE) MG EC tablet Take 325 mg by mouth 3 (three) times daily with meals.    [provider]  losartan  (COZAAR ) 25 MG tablet TAKE (1/2) TABLET BY MOUTH DAILY 08/25/23   Elmyra Haggard, MD  Misc. 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 04/28/23   Tobi Fortes, MD   ondansetron  (ZOFRAN ) 4 MG tablet Take 1 tablet (4 mg total) by mouth every 8 (eight) hours as needed for nausea or vomiting. 05/13/21   Finis Hugger, Dimple Francis, PA-C  spironolactone  (ALDACTONE ) 25 MG tablet Take 0.5 tablets (12.5 mg total) by mouth daily. 06/09/23 09/07/23  Strader, Dimple Francis, PA-C  VEOZAH  45 MG TABS Take 1 tablet (45 mg total) by mouth daily. 10/09/23   Tobi Fortes, MD    Physical Exam: BP 129/73 (BP Location: Left Arm)   Pulse 64   Temp 98.4 F (36.9 C) (Oral)   Resp 17   Ht 5\' 1"  (1.549 m)   Wt 68 kg   SpO2 99%   BMI 28.33 kg/m   General: 88 y.o. year-old female well developed well nourished in no acute distress.  Alert and  oriented x3. HEENT: NCAT, EOMI Neck: Supple, trachea medial Cardiovascular: Regular rate and rhythm with no rubs or gallops.  No thyromegaly or JVD noted.  No lower extremity edema. 2/4 pulses in all 4 extremities. Respiratory: Clear to auscultation with no wheezes or rales. Good inspiratory effort. Abdomen: Soft, nontender nondistended with normal bowel sounds x4 quadrants. Muskuloskeletal: No cyanosis, clubbing or edema noted bilaterally Neuro: CN II-XII intact, strength 5/5 x 4, sensation, reflexes intact Skin: No ulcerative lesions noted or rashes Psychiatry: Judgement and insight appear normal. Mood is appropriate for condition and setting          Labs on Admission:  Basic Metabolic Panel: Recent Labs  Lab 10/21/23 1901  NA 142  K 3.6  CL 109  CO2 22  GLUCOSE 108*  BUN 20  CREATININE 0.70  CALCIUM  9.3   Liver Function Tests: Recent Labs  Lab 10/21/23 1901  AST 19  ALT 10  ALKPHOS 44  BILITOT 0.9  PROT 5.9*  ALBUMIN 3.6   No results for input(s): "LIPASE", "AMYLASE" in the last 168 hours. No results for input(s): "AMMONIA" in the last 168 hours. CBC: Recent Labs  Lab 10/21/23 1901  WBC 12.1*  NEUTROABS 4.1  HGB 9.3*  HCT 28.2*  MCV 97.6  PLT 186   Cardiac Enzymes: No results for input(s): "CKTOTAL",  "CKMB", "CKMBINDEX", "TROPONINI" in the last 168 hours.  BNP (last 3 results) Recent Labs    10/21/23 1901  BNP 755.0*    ProBNP (last 3 results) No results for input(s): "PROBNP" in the last 8760 hours.  CBG: No results for input(s): "GLUCAP" in the last 168 hours.  Radiological Exams on Admission: CT Angio Chest PE W/Cm &/Or Wo Cm Result Date: 10/21/2023 CLINICAL DATA:  Pulmonary embolus suspected with high probability. Shortness of breath starting 3 hours ago. Worse with exertion. EXAM: CT ANGIOGRAPHY CHEST WITH CONTRAST TECHNIQUE: Multidetector CT imaging of the chest was performed using the standard protocol during bolus administration of intravenous contrast. Multiplanar CT image reconstructions and MIPs were obtained to evaluate the vascular anatomy. RADIATION DOSE REDUCTION: This exam was performed according to the departmental dose-optimization program which includes automated exposure control, adjustment of the mA and/or kV according to patient size and/or use of iterative reconstruction technique. CONTRAST:  75mL OMNIPAQUE  IOHEXOL  350 MG/ML SOLN COMPARISON:  Chest radiograph 10/21/2023.  CT chest 04/07/2021 FINDINGS: Cardiovascular: Pending the adequate study with good opacification of the central and segmental pulmonary arteries. Moderate motion artifact. No focal filling defects are demonstrated. No evidence of significant pulmonary embolus. Cardiac enlargement. No pericardial effusions. Normal caliber thoracic aorta. No aortic dissection. Calcification of the aorta and coronary arteries. 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 Mediastinum/Nodes: Esophagus is decompressed. No significant lymphadenopathy. Left thyroid  gland nodule measuring 2.6 cm diameter. This is enlarging since prior study. Elective follow-up ultrasound recommended. Lungs/Pleura: Motion artifact. Interstitial changes in the lungs with interlobular septal  thickening, progressing since prior study. This is most likely represent edema. No airspace disease or consolidation. No pleural effusion or pneumothorax. Upper Abdomen: Punctate sized stones in the kidneys. No hydronephrosis. Surgical absence of the gallbladder. No acute abnormalities. Musculoskeletal: Degenerative changes in the spine. No acute bony abnormalities. Review of the MIP images confirms the above findings. IMPRESSION: 1. No evidence of significant pulmonary embolus. 2. Diffuse interstitial pattern to the lungs progressing since prior study, likely edema. 3. Cardiac enlargement. 4. Aortic atherosclerosis. 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 No change. 5. 2.6 cm left thyroid  nodule appears to be increasing since prior study. Elective follow-up ultrasound is recommended. 6. Nonobstructing intrarenal stones are incidentally noted. Electronically Signed   By: Boyce Byes M.D.   On: 10/21/2023 21:08   DG Chest 2 View Result Date: 10/21/2023 CLINICAL DATA:  Dyspnea. EXAM: CHEST - 2 VIEW COMPARISON:  Chest x-ray 03/11/2023. CT abdomen and pelvis 06/05/2015. FINDINGS: The heart is enlarged, unchanged. The lungs are clear. There is no pleural effusion or pneumothorax. No acute fractures are seen. Cholecystectomy clips are present. Right renal calcifications are again seen measuring up to 17 mm. IMPRESSION: 1. No active cardiopulmonary disease. 2. Cardiomegaly. 3. Stable right renal calcifications measuring up to 17 mm. Electronically Signed   By: Tyron Gallon M.D.   On: 10/21/2023 19:17    EKG: I independently viewed the EKG done and my findings are as followed: Atrial fibrillation with rate controlled  Assessment/Plan Present on Admission:  Acute on chronic combined systolic and diastolic CHF (congestive heart failure) (HCC)  Mixed hyperlipidemia  COPD (chronic obstructive pulmonary disease) (HCC)  CAD (coronary artery disease)  GERD without  esophagitis  Principal Problem:   Acute on chronic combined systolic and diastolic CHF (congestive heart failure) (HCC) Active Problems:   Mixed hyperlipidemia   COPD (chronic obstructive pulmonary disease) (HCC)   CAD (coronary artery disease)   GERD without esophagitis   Persistent atrial fibrillation (HCC)  Acute on chronic systolic and diastolic CHF Continue total input/output, daily weights and fluid restriction Continue IV Lasix  20 mg twice daily Continue heart healthy diet  Echocardiogram done in 03/2021 showed LVEF of 35 to 40%. + RWMA.  Moderate LVH G1 DD.  Echocardiogram will be done in the morning   Left leg cellulitis Patient presented with left lower leg tick bite which was removed in the ED. The area was marked, but this has started to spread and was itchy since admission Patient was started on Augmentin  Continue topical Benadryl as needed  COPD (not in acute exacerbation) Continue Ventolin  HFA as needed  Persistent atrial fibrillation Continue bisoprolol , Eliquis   CAD Continue bisoprolol , Lipitor, Eliquis   Mixed hyperlipidemia Continue Lipitor  GERD Continue Protonix    DVT prophylaxis: Eliquis   Code Status: Full code  Family Communication: Friend at bedside (all questions answered to satisfaction)  Consults: None  Severity of Illness: The appropriate patient status for this patient is INPATIENT. Inpatient status is judged to be reasonable and necessary in order to provide the required intensity of service to ensure the patient's safety. The patient's presenting symptoms, physical exam findings, and initial radiographic and laboratory data in the context of their chronic comorbidities is felt to place them at high risk for further clinical deterioration. Furthermore, it is not anticipated that the patient will be medically stable for discharge from the hospital within 2 midnights of admission.   * I certify that at the point of admission it is my  clinical judgment that the patient will require inpatient hospital care spanning beyond 2 midnights from the point of admission due to high intensity of service, high risk for further deterioration and high frequency of surveillance required.*  Author: Alisah Grandberry, DO 10/22/2023 3:33 AM  For on call review [email or web address].

## 2023-10-21 NOTE — ED Notes (Signed)
 Family, pt's sister, updated as to patient's status.

## 2023-10-22 ENCOUNTER — Inpatient Hospital Stay (HOSPITAL_COMMUNITY)

## 2023-10-22 DIAGNOSIS — I5043 Acute on chronic combined systolic (congestive) and diastolic (congestive) heart failure: Secondary | ICD-10-CM

## 2023-10-22 DIAGNOSIS — L03116 Cellulitis of left lower limb: Secondary | ICD-10-CM | POA: Insufficient documentation

## 2023-10-22 DIAGNOSIS — I083 Combined rheumatic disorders of mitral, aortic and tricuspid valves: Secondary | ICD-10-CM

## 2023-10-22 DIAGNOSIS — I4819 Other persistent atrial fibrillation: Secondary | ICD-10-CM | POA: Insufficient documentation

## 2023-10-22 DIAGNOSIS — I5030 Unspecified diastolic (congestive) heart failure: Secondary | ICD-10-CM

## 2023-10-22 LAB — COMPREHENSIVE METABOLIC PANEL WITH GFR
ALT: 12 U/L (ref 0–44)
AST: 21 U/L (ref 15–41)
Albumin: 3.8 g/dL (ref 3.5–5.0)
Alkaline Phosphatase: 45 U/L (ref 38–126)
Anion gap: 11 (ref 5–15)
BUN: 18 mg/dL (ref 8–23)
CO2: 24 mmol/L (ref 22–32)
Calcium: 9.1 mg/dL (ref 8.9–10.3)
Chloride: 106 mmol/L (ref 98–111)
Creatinine, Ser: 0.75 mg/dL (ref 0.44–1.00)
GFR, Estimated: >60 mL/min (ref >60)
Glucose, Bld: 111 mg/dL — ABNORMAL HIGH (ref 70–99)
Potassium: 3.1 mmol/L — ABNORMAL LOW (ref 3.5–5.1)
Sodium: 141 mmol/L (ref 135–145)
Total Bilirubin: 0.9 mg/dL (ref 0.0–1.2)
Total Protein: 5.9 g/dL — ABNORMAL LOW (ref 6.5–8.1)

## 2023-10-22 LAB — ECHOCARDIOGRAM COMPLETE
AR max vel: 1.91 cm2
AV Area VTI: 1.93 cm2
AV Area mean vel: 1.82 cm2
AV Mean grad: 8.2 mmHg
AV Peak grad: 15.1 mmHg
Ao pk vel: 1.94 m/s
Area-P 1/2: 2.24 cm2
Height: 61 in
MV M vel: 5.08 m/s
MV Peak grad: 103.2 mmHg
Radius: 0.4 cm
S' Lateral: 3.5 cm
Weight: 2398.6 [oz_av]

## 2023-10-22 LAB — CBC
HCT: 30.8 % — ABNORMAL LOW (ref 36.0–46.0)
Hemoglobin: 10.2 g/dL — ABNORMAL LOW (ref 12.0–15.0)
MCH: 32.3 pg (ref 26.0–34.0)
MCHC: 33.1 g/dL (ref 30.0–36.0)
MCV: 97.5 fL (ref 80.0–100.0)
Platelets: 235 10*3/uL (ref 150–400)
RBC: 3.16 MIL/uL — ABNORMAL LOW (ref 3.87–5.11)
RDW: 15.7 % — ABNORMAL HIGH (ref 11.5–15.5)
WBC: 17.1 10*3/uL — ABNORMAL HIGH (ref 4.0–10.5)
nRBC: 0 % (ref 0.0–0.2)

## 2023-10-22 LAB — PHOSPHORUS: Phosphorus: 3.7 mg/dL (ref 2.5–4.6)

## 2023-10-22 LAB — MAGNESIUM: Magnesium: 2 mg/dL (ref 1.7–2.4)

## 2023-10-22 MED ORDER — APIXABAN 5 MG PO TABS
5.0000 mg | ORAL_TABLET | Freq: Two times a day (BID) | ORAL | Status: DC
  Administered 2023-10-22 – 2023-10-23 (×3): 5 mg via ORAL
  Filled 2023-10-22 (×3): qty 1

## 2023-10-22 MED ORDER — BISOPROLOL FUMARATE 5 MG PO TABS
5.0000 mg | ORAL_TABLET | Freq: Every day | ORAL | Status: DC
  Administered 2023-10-22 – 2023-10-23 (×2): 5 mg via ORAL
  Filled 2023-10-22 (×2): qty 1

## 2023-10-22 MED ORDER — ACETAMINOPHEN 325 MG PO TABS
650.0000 mg | ORAL_TABLET | Freq: Four times a day (QID) | ORAL | Status: DC | PRN
  Administered 2023-10-22 (×2): 650 mg via ORAL
  Filled 2023-10-22 (×2): qty 2

## 2023-10-22 MED ORDER — AMOXICILLIN-POT CLAVULANATE 875-125 MG PO TABS
1.0000 | ORAL_TABLET | Freq: Two times a day (BID) | ORAL | Status: DC
  Filled 2023-10-22: qty 1

## 2023-10-22 MED ORDER — LOSARTAN POTASSIUM 50 MG PO TABS
25.0000 mg | ORAL_TABLET | Freq: Every day | ORAL | Status: DC
  Administered 2023-10-22: 25 mg via ORAL
  Filled 2023-10-22: qty 1

## 2023-10-22 MED ORDER — ACETAMINOPHEN 650 MG RE SUPP: 650.0000 mg | Freq: Four times a day (QID) | RECTAL | Status: DC | PRN

## 2023-10-22 MED ORDER — ALBUTEROL SULFATE (2.5 MG/3ML) 0.083% IN NEBU: 2.0000 mL | INHALATION_SOLUTION | Freq: Four times a day (QID) | RESPIRATORY_TRACT | Status: DC | PRN

## 2023-10-22 MED ORDER — ONDANSETRON HCL 4 MG PO TABS
4.0000 mg | ORAL_TABLET | Freq: Four times a day (QID) | ORAL | Status: DC | PRN
  Administered 2023-10-23: 4 mg via ORAL
  Filled 2023-10-22: qty 1

## 2023-10-22 MED ORDER — ONDANSETRON HCL 4 MG/2ML IJ SOLN
4.0000 mg | Freq: Four times a day (QID) | INTRAMUSCULAR | Status: DC | PRN
  Administered 2023-10-22: 4 mg via INTRAVENOUS
  Filled 2023-10-22: qty 2

## 2023-10-22 MED ORDER — DOXYCYCLINE HYCLATE 100 MG PO TABS
100.0000 mg | ORAL_TABLET | Freq: Two times a day (BID) | ORAL | Status: DC
  Administered 2023-10-22 – 2023-10-23 (×3): 100 mg via ORAL
  Filled 2023-10-22 (×3): qty 1

## 2023-10-22 MED ORDER — FERROUS SULFATE 325 (65 FE) MG PO TABS
325.0000 mg | ORAL_TABLET | Freq: Three times a day (TID) | ORAL | Status: DC
  Administered 2023-10-22 – 2023-10-23 (×3): 325 mg via ORAL
  Filled 2023-10-22 (×6): qty 1

## 2023-10-22 MED ORDER — SPIRONOLACTONE 12.5 MG HALF TABLET
12.5000 mg | ORAL_TABLET | Freq: Every day | ORAL | Status: DC
  Administered 2023-10-22 – 2023-10-23 (×2): 12.5 mg via ORAL
  Filled 2023-10-22 (×2): qty 1

## 2023-10-22 MED ORDER — ENOXAPARIN SODIUM 40 MG/0.4ML IJ SOSY: 40.0000 mg | PREFILLED_SYRINGE | INTRAMUSCULAR | Status: DC

## 2023-10-22 MED ORDER — DIPHENHYDRAMINE-ZINC ACETATE 2-0.1 % EX CREA: TOPICAL_CREAM | Freq: Three times a day (TID) | CUTANEOUS | Status: DC | PRN

## 2023-10-22 MED ORDER — POTASSIUM CHLORIDE CRYS ER 20 MEQ PO TBCR
40.0000 meq | EXTENDED_RELEASE_TABLET | Freq: Two times a day (BID) | ORAL | Status: AC
  Administered 2023-10-22 (×2): 40 meq via ORAL
  Filled 2023-10-22 (×2): qty 2

## 2023-10-22 MED ORDER — FUROSEMIDE 10 MG/ML IJ SOLN
40.0000 mg | Freq: Two times a day (BID) | INTRAMUSCULAR | Status: DC
  Administered 2023-10-22: 40 mg via INTRAVENOUS
  Filled 2023-10-22: qty 4

## 2023-10-22 MED ORDER — PANTOPRAZOLE SODIUM 40 MG PO TBEC
40.0000 mg | DELAYED_RELEASE_TABLET | Freq: Every day | ORAL | Status: DC
  Administered 2023-10-22 – 2023-10-23 (×2): 40 mg via ORAL
  Filled 2023-10-22 (×2): qty 1

## 2023-10-22 MED ORDER — ATORVASTATIN CALCIUM 20 MG PO TABS
20.0000 mg | ORAL_TABLET | Freq: Every day | ORAL | Status: DC
  Administered 2023-10-22 – 2023-10-23 (×2): 20 mg via ORAL
  Filled 2023-10-22 (×2): qty 1

## 2023-10-22 MED ORDER — FUROSEMIDE 10 MG/ML IJ SOLN
20.0000 mg | Freq: Two times a day (BID) | INTRAMUSCULAR | Status: DC
  Administered 2023-10-22: 20 mg via INTRAVENOUS
  Filled 2023-10-22: qty 2

## 2023-10-22 NOTE — Progress Notes (Signed)
 Patient complaint of burning and itching to her left shin. Pt stated she had a tick removed in the ED. This are was circled when patient arrived to the floor. Redness has spread slightly outside of the circle. MD made aware.

## 2023-10-22 NOTE — Progress Notes (Signed)
   10/22/23 1210  TOC Brief Assessment  Insurance and Status Reviewed  Patient has primary care physician Yes (DIXON, PHILLIP E)  Home environment has been reviewed Home  Prior level of function: Independent  Prior/Current Home Services No current home services  Social Drivers of Health Review SDOH reviewed no interventions necessary  Readmission risk has been reviewed Yes  Transition of care needs transition of care needs identified, TOC will continue to follow (PT/OT eval pending- watch for recommendations.)

## 2023-10-22 NOTE — Plan of Care (Signed)

## 2023-10-22 NOTE — Progress Notes (Signed)
 PROGRESS NOTE    [name]  GNF:[id number] DOB: 1935-09-18 DOA: 10/21/2023 PCP: Tobi Fortes, MD   Brief Narrative:    [name] is a 88 y.o. female with medical history significant of chronic HFrEF, CAD, hyperlipidemia, paroxysmal atrial fibrillation, COPD, CLL who presents to the emergency department due to worsening shortness of breath that has been going for about a week.  Patient has been admitted for acute on chronic systolic and diastolic CHF exacerbation and is noted to also have left leg cellulitis with tachycardia that was removed in the ED on admission.  Assessment & Plan:   Principal Problem:   Acute on chronic combined systolic and diastolic CHF (congestive heart failure) (HCC) Active Problems:   Mixed hyperlipidemia   COPD (chronic obstructive pulmonary disease) (HCC)   CAD (coronary artery disease)   GERD without esophagitis   Persistent atrial fibrillation (HCC)   Left leg cellulitis  Assessment and Plan:   Acute on chronic systolic and diastolic CHF Continue total input/output, daily weights and fluid restriction Continue IV Lasix , now to 40 mg twice daily to increase urine output Continue heart healthy diet      Echocardiogram done in 03/2021 showed LVEF of 35 to 40%. + RWMA.  Moderate LVH G1 DD.  Echocardiogram performed with results pending -PT/OT evaluation   Left leg cellulitis Patient presented with left lower leg tick bite which was removed in the ED. The area was marked, but this has started to spread and was itchy since admission Patient was started on Augmentin , this will be changed to doxycycline Continue topical Benadryl as needed  Hypokalemia -In the setting of diuresis, replete and reevaluate in a.m.   COPD (not in acute exacerbation) Continue Ventolin  HFA as needed   Persistent atrial fibrillation Continue bisoprolol , Eliquis    CAD Continue bisoprolol , Lipitor, Eliquis    Mixed hyperlipidemia Continue Lipitor    GERD Continue Protonix     DVT prophylaxis:apixaban  Code Status: Full Family Communication: None at bedside Disposition Plan:  Status is: Inpatient Remains inpatient appropriate because: Need for IV medications.   Consultants:  None  Procedures:  None  Antimicrobials:  Anti-infectives (From admission, onward)    Start     Dose/Rate Route Frequency Ordered Stop   10/22/23 1015  doxycycline (VIBRA-TABS) tablet 100 mg        100 mg Oral Every 12 hours 10/22/23 0921     10/22/23 1000  amoxicillin -clavulanate (AUGMENTIN ) 875-125 MG per tablet 1 tablet  Status:  Discontinued        1 tablet Oral Every 12 hours 10/22/23 0600 10/22/23 8469       Subjective: Patient seen and evaluated today with no new acute complaints or concerns. No acute concerns or events noted overnight.  Objective: Vitals:   10/22/23 0057 10/22/23 0404 10/22/23 0500 10/22/23 0633  BP:  128/65  (!) 92/57  Pulse:  (!) 51  (!) 56  Resp:  17  16  Temp:  98.4 F (36.9 C)  98.3 F (36.8 C)  TempSrc:  Oral  Oral  SpO2: 99% 96%  96%  Weight:   68 kg   Height:        Intake/Output Summary (Last 24 hours) at 10/22/2023 1216 Last data filed at 10/22/2023 0229 Gross per 24 hour  Intake 240 ml  Output 750 ml  Net -510 ml   Filed Weights   10/21/23 1838 10/21/23 2339 10/22/23 0500  Weight: 68 kg 68 kg 68 kg    Examination:  General exam: Appears calm and comfortable  Respiratory system: Clear to auscultation. Respiratory effort normal. Cardiovascular system: S1 & S2 heard, RRR.  Gastrointestinal system: Abdomen is soft Central nervous system: Alert and awake Extremities: No edema Skin: No significant lesions noted Psychiatry: Flat affect.    Data Reviewed: I have personally reviewed following labs and imaging studies  CBC: Recent Labs  Lab 10/21/23 1901 10/22/23 0339  WBC 12.1* 17.1*  NEUTROABS 4.1  --   HGB 9.3* 10.2*  HCT 28.2* 30.8*  MCV 97.6 97.5  PLT 186 235   Basic Metabolic  Panel: Recent Labs  Lab 10/21/23 1901 10/22/23 0339  NA 142 141  K 3.6 3.1*  CL 109 106  CO2 22 24  GLUCOSE 108* 111*  BUN 20 18  CREATININE 0.70 0.75  CALCIUM  9.3 9.1  MG  --  2.0  PHOS  --  3.7   GFR: Estimated Creatinine Clearance: 44.5 mL/min (by C-G formula based on SCr of 0.75 mg/dL). Liver Function Tests: Recent Labs  Lab 10/21/23 1901 10/22/23 0339  AST 19 21  ALT 10 12  ALKPHOS 44 45  BILITOT 0.9 0.9  PROT 5.9* 5.9*  ALBUMIN 3.6 3.8   No results for input(s): "LIPASE", "AMYLASE" in the last 168 hours. No results for input(s): "AMMONIA" in the last 168 hours. Coagulation Profile: No results for input(s): "INR", "PROTIME" in the last 168 hours. Cardiac Enzymes: No results for input(s): "CKTOTAL", "CKMB", "CKMBINDEX", "TROPONINI" in the last 168 hours. BNP (last 3 results) No results for input(s): "PROBNP" in the last 8760 hours. HbA1C: No results for input(s): "HGBA1C" in the last 72 hours. CBG: No results for input(s): "GLUCAP" in the last 168 hours. Lipid Profile: No results for input(s): "CHOL", "HDL", "LDLCALC", "TRIG", "CHOLHDL", "LDLDIRECT" in the last 72 hours. Thyroid  Function Tests: No results for input(s): "TSH", "T4TOTAL", "FREET4", "T3FREE", "THYROIDAB" in the last 72 hours. Anemia Panel: No results for input(s): "VITAMINB12", "FOLATE", "FERRITIN", "TIBC", "IRON", "RETICCTPCT" in the last 72 hours. Sepsis Labs: No results for input(s): "PROCALCITON", "LATICACIDVEN" in the last 168 hours.  No results found for this or any previous visit (from the past 240 hours).       Radiology Studies: CT Angio Chest PE W/Cm &/Or Wo Cm Result Date: 10/21/2023 CLINICAL DATA:  Pulmonary embolus suspected with high probability. Shortness of breath starting 3 hours ago. Worse with exertion. EXAM: CT ANGIOGRAPHY CHEST WITH CONTRAST TECHNIQUE: Multidetector CT imaging of the chest was performed using the standard protocol during bolus administration of  intravenous contrast. Multiplanar CT image reconstructions and MIPs were obtained to evaluate the vascular anatomy. RADIATION DOSE REDUCTION: This exam was performed according to the departmental dose-optimization program which includes automated exposure control, adjustment of the mA and/or kV according to patient size and/or use of iterative reconstruction technique. CONTRAST:  75mL OMNIPAQUE  IOHEXOL  350 MG/ML SOLN COMPARISON:  Chest radiograph 10/21/2023.  CT chest 04/07/2021 FINDINGS: Cardiovascular: Pending the adequate study with good opacification of the central and segmental pulmonary arteries. Moderate motion artifact. No focal filling defects are demonstrated. No evidence of significant pulmonary embolus. Cardiac enlargement. No pericardial effusions. Normal caliber thoracic aorta. No aortic dissection. Calcification of the aorta and coronary arteries. 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 Mediastinum/Nodes: Esophagus is decompressed. No significant lymphadenopathy. Left thyroid  gland nodule measuring 2.6 cm diameter. This is enlarging since prior study. Elective follow-up ultrasound recommended. Lungs/Pleura: Motion artifact. Interstitial changes in the lungs with interlobular septal thickening, progressing  since prior study. This is most likely represent edema. No airspace disease or consolidation. No pleural effusion or pneumothorax. Upper Abdomen: Punctate sized stones in the kidneys. No hydronephrosis. Surgical absence of the gallbladder. No acute abnormalities. Musculoskeletal: Degenerative changes in the spine. No acute bony abnormalities. Review of the MIP images confirms the above findings. IMPRESSION: 1. No evidence of significant pulmonary embolus. 2. Diffuse interstitial pattern to the lungs progressing since prior study, likely edema. 3. Cardiac enlargement. 4. Aortic atherosclerosis. 1.8 cm diameter aneurysm of the proximal right  subclavian artery. No change. 5. 2.6 cm left thyroid  nodule appears to be increasing since prior study. Elective follow-up ultrasound is recommended. 6. Nonobstructing intrarenal stones are incidentally noted. Electronically Signed   By: Boyce Byes M.D.   On: 10/21/2023 21:08   DG Chest 2 View Result Date: 10/21/2023 CLINICAL DATA:  Dyspnea. EXAM: CHEST - 2 VIEW COMPARISON:  Chest x-ray 03/11/2023. CT abdomen and pelvis 06/05/2015. FINDINGS: The heart is enlarged, unchanged. The lungs are clear. There is no pleural effusion or pneumothorax. No acute fractures are seen. Cholecystectomy clips are present. Right renal calcifications are again seen measuring up to 17 mm. IMPRESSION: 1. No active cardiopulmonary disease. 2. Cardiomegaly. 3. Stable right renal calcifications measuring up to 17 mm. Electronically Signed   By: Tyron Gallon M.D.   On: 10/21/2023 19:17        Scheduled Meds:  apixaban   5 mg Oral BID   atorvastatin   20 mg Oral Daily   bisoprolol   5 mg Oral Daily   doxycycline  100 mg Oral Q12H   furosemide   20 mg Intravenous Q12H   losartan   25 mg Oral Daily   pantoprazole   40 mg Oral Daily   potassium chloride   40 mEq Oral BID     LOS: 1 day    Time spent: 55 minutes    Lakeasha Petion Loran Rock, DO [facility]  If 7PM-7AM, please contact night-coverage [email or web address] 10/22/2023, 12:16 PM

## 2023-10-22 NOTE — Progress Notes (Signed)
 Pt has voided over since 0700 today. Pt ambulated in hallway with standby assist, tolerated well, walked > 535ft, SaO2 remained > 95% on room air. Pt has tolerated food/fluids without complaint. No SOB. Continues with reddened area to left shin, pt states not itching as bad but using cream as needed. Reminded to call for needs, states understanding. Call bell within reach, bed alarm on for safety.

## 2023-10-22 NOTE — Plan of Care (Signed)

## 2023-10-23 DIAGNOSIS — I5043 Acute on chronic combined systolic (congestive) and diastolic (congestive) heart failure: Secondary | ICD-10-CM | POA: Diagnosis not present

## 2023-10-23 LAB — CBC
HCT: 29.8 % — ABNORMAL LOW (ref 36.0–46.0)
Hemoglobin: 9.5 g/dL — ABNORMAL LOW (ref 12.0–15.0)
MCH: 30.5 pg (ref 26.0–34.0)
MCHC: 31.9 g/dL (ref 30.0–36.0)
MCV: 95.8 fL (ref 80.0–100.0)
Platelets: 235 10*3/uL (ref 150–400)
RBC: 3.11 MIL/uL — ABNORMAL LOW (ref 3.87–5.11)
RDW: 15.8 % — ABNORMAL HIGH (ref 11.5–15.5)
WBC: 14.2 10*3/uL — ABNORMAL HIGH (ref 4.0–10.5)
nRBC: 0 % (ref 0.0–0.2)

## 2023-10-23 LAB — BASIC METABOLIC PANEL WITH GFR
Anion gap: 10 (ref 5–15)
BUN: 24 mg/dL — ABNORMAL HIGH (ref 8–23)
CO2: 24 mmol/L (ref 22–32)
Calcium: 9 mg/dL (ref 8.9–10.3)
Chloride: 107 mmol/L (ref 98–111)
Creatinine, Ser: 0.99 mg/dL (ref 0.44–1.00)
GFR, Estimated: 56 mL/min — ABNORMAL LOW (ref >60)
Glucose, Bld: 115 mg/dL — ABNORMAL HIGH (ref 70–99)
Potassium: 4.3 mmol/L (ref 3.5–5.1)
Sodium: 141 mmol/L (ref 135–145)

## 2023-10-23 LAB — MAGNESIUM: Magnesium: 1.9 mg/dL (ref 1.7–2.4)

## 2023-10-23 MED ORDER — SPIRONOLACTONE 25 MG PO TABS: 12.5000 mg | ORAL_TABLET | Freq: Every day | ORAL | 3 refills | Status: AC

## 2023-10-23 MED ORDER — DOXYCYCLINE HYCLATE 100 MG PO TABS: 100.0000 mg | ORAL_TABLET | Freq: Two times a day (BID) | ORAL | 0 refills | Status: AC

## 2023-10-23 MED ORDER — FUROSEMIDE 20 MG PO TABS
20.0000 mg | ORAL_TABLET | Freq: Every day | ORAL | 2 refills | Status: DC | PRN
Start: 2023-10-23 — End: 2024-02-21

## 2023-10-23 NOTE — Progress Notes (Signed)
 PT Cancellation Note  Patient Details Name: MARCELINA MCLAURIN MRN: [id number] DOB: 18-Jul-1935   Cancelled Treatment:    Reason Eval/Treat Not Completed: PT screened, no needs identified, will sign off   11:00 AM, 10/23/23 Walton Guppy, MPT Physical Therapist with [facility] [phone] office [phone] mobile phone

## 2023-10-23 NOTE — Progress Notes (Signed)
 Mobility Specialist Progress Note:    10/23/23 0955  Mobility  Activity Ambulated with assistance in hallway  Level of Assistance Standby assist, set-up cues, supervision of patient - no hands on  Assistive Device Cane  Distance Ambulated (ft) 100 ft  Range of Motion/Exercises Active;All extremities  Activity Response Tolerated well  Mobility Referral Yes  Mobility visit 1 Mobility  Mobility Specialist Start Time (ACUTE ONLY) 0933  Mobility Specialist Stop Time (ACUTE ONLY) 0955  Mobility Specialist Time Calculation (min) (ACUTE ONLY) 22 min   Pt received in bed in bed, agreeable to mobility. Required SBA to stand and ambulate using cane and 1 person hand-held assist. Tolerated well, audible SOB. SpO2 94% on RA after ambulation. Left pt sitting EOB, all needs met.   Glinda Lapping Mobility Specialist Please contact via Special educational needs teacher or  Rehab office at [phone]

## 2023-10-23 NOTE — Discharge Summary (Signed)
 Physician Discharge Summary  [name] HKV:[id number] DOB: [birth year] DOA: 10/21/2023  PCP: Veronica Fortes, Wallace  Admit date: 10/21/2023  Discharge date: 10/23/2023  Admitted From:Home  Disposition:  Home  Recommendations for Outpatient Follow-up:  Follow up with PCP in 1-2 weeks Follow-up with cardiology [name] as scheduled 7/17 Continue home medications as prior Lasix  20 mg daily as needed for edema or shortness of breath Complete course of doxycycline as prescribed for tick bite with cellulitis  Home Health: None  Equipment/Devices: None  Discharge Condition:Stable  CODE STATUS: Full  Diet recommendation: Heart Healthy  Brief/Interim Summary: [name] is a 88 y.o. female with medical history significant of chronic HFrEF, CAD, hyperlipidemia, paroxysmal atrial fibrillation, COPD, CLL who presents to the emergency department due to worsening shortness of breath that has been going for about a week.  Patient has been admitted for acute on chronic systolic and diastolic CHF exacerbation and is noted to also have left leg cellulitis with tick that was removed in the ED on admission.  She was placed on diuresis and no longer has shortness of breath or any other symptoms and appears euvolemic.  2D echocardiogram reveals LVEF 50-55% with grade 1 diastolic dysfunction and she will now remain on Lasix  as needed with continuation of her other usual home medications.  She is scheduled to see [name] 7/17.  Cellulitis appears stable and improving and she will remain on doxycycline as prescribed.  No other acute events or concerns noted.  Discharge Diagnoses:  Principal Problem:   Acute on chronic combined systolic and diastolic CHF (congestive heart failure) (HCC) Active Problems:   Mixed hyperlipidemia   COPD (chronic obstructive pulmonary disease) (HCC)   CAD (coronary artery disease)   GERD without esophagitis   Persistent atrial fibrillation (HCC)   Left leg  cellulitis  Principal discharge diagnosis: Acute on chronic diastolic CHF exacerbation with left leg cellulitis in the setting of tick bite.  Discharge Instructions  Discharge Instructions     Diet - low sodium heart healthy   Complete by: As directed    Increase activity slowly   Complete by: As directed       Allergies as of 10/23/2023       Reactions   Albuterol  Other (See Comments)   Pt stated after she used the albuterol  nebulizer solution on 10-14-2023 she had a very bad headache that didn't go away for 4 days even with taking Tylenol  to try to help   Codeine Nausea And Vomiting   Headache    Entresto [sacubitril-valsartan] Other (See Comments)   Unknown    Jardiance  [empagliflozin ] Other (See Comments)   Unknown    Meclizine Other (See Comments)   Made dizziness worse.   Sulfa Antibiotics Nausea And Vomiting   Stomach upset   Zoloft [sertraline Hcl] Anxiety        Medication List     TAKE these medications    acetaminophen  500 MG tablet Commonly known as: TYLENOL  Take 1,000 mg by mouth every 6 (six) hours as needed for moderate pain or headache.   albuterol  108 (90 Base) MCG/ACT inhaler Commonly known as: VENTOLIN  HFA Inhale 2 puffs into the lungs every 6 (six) hours as needed for shortness of breath.   albuterol  (2.5 MG/3ML) 0.083% nebulizer solution Commonly known as: PROVENTIL  Take 3 mLs (2.5 mg total) by nebulization every 6 (six) hours as needed for wheezing or shortness of breath (when not relieved by inhaler.).   atorvastatin  20 MG  tablet Commonly known as: LIPITOR TAKE ONE TABLET BY MOUTH ONCE DAILY.   bisoprolol  5 MG tablet Commonly known as: ZEBETA  TAKE (1/2) TABLET BY MOUTH ONCE DAILY. What changed: See the new instructions.   BLACK COHOSH PO Take 1 capsule by mouth daily.   cholecalciferol  25 MCG (1000 UNIT) tablet Commonly known as: VITAMIN D3 Take 1,000-2,000 Units by mouth daily. Alternating between 1-2 tabs every other day (Monday  - 2 tabs, Tuesday - 1 tab, Wednesday - 2 tabs, Thursday - 1 tab, Friday - 2 tabs) On Saturday and "Sunday it's 1 tab daily   diphenhydrAMINE 25 MG tablet Commonly known as: BENADRYL Take 25 mg by mouth daily as needed for allergies.   doxycycline 100 MG tablet Commonly known as: VIBRA-TABS Take 1 tablet (100 mg total) by mouth every 12 (twelve) hours for 5 days.   Eliquis 5 MG Tabs tablet Generic drug: apixaban TAKE 1 TABLET BY MOUTH TWICE A DAY   ferrous sulfate 325 (65 FE) MG EC tablet Take 325 mg by mouth 3 (three) times daily with meals.   furosemide 20 MG tablet Commonly known as: Lasix Take 1 tablet (20 mg total) by mouth daily as needed for fluid or edema (shortness of breath).   losartan 25 MG tablet Commonly known as: COZAAR TAKE (1/2) TABLET BY MOUTH DAILY What changed: See the new instructions.   Misc. Devices Misc Please provide patient with breast prosthesis and mastectomy bra. Dx: C50.911   omeprazole 20 MG capsule Commonly known as: PRILOSEC Take 1 capsule (20 mg total) by mouth daily.   ondansetron 4 MG tablet Commonly known as: ZOFRAN Take 1 tablet (4 mg total) by mouth every 8 (eight) hours as needed for nausea or vomiting. What changed: when to take this   spironolactone 25 MG tablet Commonly known as: ALDACTONE Take 0.5 tablets (12.5 mg total) by mouth daily.   Veozah 45 MG Tabs Generic drug: Fezolinetant Take 1 tablet (45 mg total) by mouth daily.        Follow-up Information     [name]. Schedule an appointment as soon as possible for a visit in 1 week(s).   Specialty: Internal Medicine Contact information: [address] [place] [address] [phone]         [name]. Go on 12/07/2023.   Specialty: Cardiology Contact information: [address] [place] [address] [phone]                Allergies  Allergen Reactions   Albuterol Other (See Comments)    Pt stated after she  used the albuterol nebulizer solution on 10-14-2023 she had a very bad headache that didn't go away for 4 days even with taking Tylenol to try to help   Codeine Nausea And Vomiting    Headache    Entresto [Sacubitril-Valsartan] Other (See Comments)    Unknown    Jardiance [Empagliflozin] Other (See Comments)    Unknown    Meclizine Other (See Comments)    Made dizziness worse.   Sulfa Antibiotics Nausea And Vomiting    Stomach upset   Zoloft [Sertraline Hcl] Anxiety    Consultations: None   Procedures/Studies: ECHOCARDIOGRAM COMPLETE Result Date: 10/22/2023    ECHOCARDIOGRAM REPORT   Patient Name:   Arwa L Askari Date of Exam: 10/22/2023 Medical Rec #:  [id number]         Height:       61" .0 in Accession #:  [id number]        Weight:       149.9 lb Date of Birth:  1935/08/01         BSA:          1.671 m Patient Age:    88 years          BP:           125/72 mmHg Patient Gender: F                 HR:           55 bpm. Exam Location:  Outpatient Procedure: 2D Echo, 3D Echo, Color Doppler and Cardiac Doppler (Both Spectral            and Color Flow Doppler were utilized during procedure). Indications:    CHF  History:        Patient has prior history of Echocardiogram examinations, most                 recent 04/08/2021. COPD, Arrythmias:Atrial Fibrillation; Risk                 Factors:Dyslipidemia and Former Smoker. HFrEF; Breast cancer.  Sonographer:    Gelene Kelly RDCS Referring Phys: [id number] OLADAPO ADEFESO IMPRESSIONS  1. Left ventricular ejection fraction, by estimation, is 50 to 55%. The left ventricle has low normal function. Left ventricular endocardial border not optimally defined to evaluate regional wall motion. Left ventricular diastolic parameters are consistent with Grade I diastolic dysfunction (impaired relaxation). The average left ventricular global longitudinal strain is -20.6 %. The global longitudinal strain is normal.  2. Right ventricular systolic function is normal.  The right ventricular size is normal.  3. Left atrial size was severely dilated.  4. The mitral valve is abnormal. Mild mitral valve regurgitation. Mild mitral stenosis. The mean mitral valve gradient is 3.0 mmHg. Moderate mitral annular calcification.  5. The aortic valve is tricuspid. There is mild calcification of the aortic valve. There is mild thickening of the aortic valve. Aortic valve regurgitation is not visualized. No aortic stenosis is present.  6. The inferior vena cava is normal in size with greater than 50% respiratory variability, suggesting right atrial pressure of 3 mmHg. FINDINGS  Left Ventricle: Left ventricular ejection fraction, by estimation, is 50 to 55%. The left ventricle has low normal function. Left ventricular endocardial border not optimally defined to evaluate regional wall motion. The average left ventricular global longitudinal strain is -20.6 %. Strain was performed and the global longitudinal strain is normal. The left ventricular internal cavity size was normal in size. There is no left ventricular hypertrophy. Left ventricular diastolic parameters are consistent with Grade I diastolic dysfunction (impaired relaxation). Normal left ventricular filling pressure. Right Ventricle: The right ventricular size is normal. Right vetricular wall thickness was not well visualized. Right ventricular systolic function is normal. Left Atrium: Left atrial size was severely dilated. Right Atrium: Right atrial size was normal in size. Pericardium: There is no evidence of pericardial effusion. Mitral Valve: The mitral valve is abnormal. There is moderate thickening of the mitral valve leaflet(s). There is moderate calcification of the mitral valve leaflet(s). Moderate mitral annular calcification. Mild mitral valve regurgitation. Mild mitral valve stenosis. The mean mitral valve gradient is 3.0 mmHg. Tricuspid Valve: The tricuspid valve is normal in structure. Tricuspid valve regurgitation is mild  . No evidence of tricuspid stenosis. Aortic Valve: The aortic valve is tricuspid. There is mild calcification of the aortic valve.  There is mild thickening of the aortic valve. There is mild aortic valve annular calcification. Aortic valve regurgitation is not visualized. No aortic stenosis  is present. Aortic valve mean gradient measures 8.2 mmHg. Aortic valve peak gradient measures 15.1 mmHg. Aortic valve area, by VTI measures 1.93 cm. Pulmonic Valve: The pulmonic valve was not well visualized. Pulmonic valve regurgitation is trivial. No evidence of pulmonic stenosis. Aorta: The aortic root and ascending aorta are structurally normal, with no evidence of dilitation. Venous: The inferior vena cava is normal in size with greater than 50% respiratory variability, suggesting right atrial pressure of 3 mmHg. IAS/Shunts: No atrial level shunt detected by color flow Doppler. Additional Comments: 3D was performed not requiring image post processing on an independent workstation and was normal.  LEFT VENTRICLE PLAX 2D LVIDd:         5.60 cm   Diastology LVIDs:         3.50 cm   LV e' medial:    4.90 cm/s LV PW:         0.90 cm   LV E/e' medial:  14.8 LV IVS:        0.90 cm   LV e' lateral:   6.64 cm/s LVOT diam:     2.10 cm   LV E/e' lateral: 10.9 LV SV:         83 LV SV Index:   50        2D Longitudinal Strain LVOT Area:     3.46 cm  2D Strain GLS Avg:     -20.6 %                           3D Volume EF:                          3D EF:        56 %                          LV EDV:       188 ml                          LV ESV:       82 ml                          LV SV:        106 ml RIGHT VENTRICLE RV Basal diam:  2.80 cm RV Mid diam:    1.60 cm RV S prime:     11.50 cm/s TAPSE (M-mode): 2.7 cm LEFT ATRIUM              Index        RIGHT ATRIUM           Index LA diam:        2.70 cm  1.62 cm/m   RA Area:     11.80 cm LA Vol (A2C):   118.0 ml 70.62 ml/m  RA Volume:   25.30 ml  15.14 ml/m LA Vol (A4C):   114.0 ml  68.22 ml/m LA Biplane Vol: 124.0 ml 74.21 ml/m  AORTIC VALVE AV Area (Vmax):    1.91 cm AV Area (Vmean):   1.82 cm AV Area (VTI):     1.93 cm AV Vmax:  194.10 cm/s AV Vmean:          133.089 cm/s AV VTI:            0.431 m AV Peak Grad:      15.1 mmHg AV Mean Grad:      8.2 mmHg LVOT Vmax:         107.00 cm/s LVOT Vmean:        69.800 cm/s LVOT VTI:          0.240 m LVOT/AV VTI ratio: 0.56  AORTA Ao Root diam: 3.20 cm Ao Asc diam:  3.10 cm MITRAL VALVE                  TRICUSPID VALVE MV Area (PHT): 2.24 cm       TR Peak grad:   30.0 mmHg MV Mean grad:  3.0 mmHg       TR Vmax:        274.00 cm/s MV Decel Time: 338 msec MR Peak grad:    103.2 mmHg   SHUNTS MR Mean grad:    61.0 mmHg    Systemic VTI:  0.24 m MR Vmax:         508.00 cm/s  Systemic Diam: 2.10 cm MR Vmean:        364.0 cm/s MR PISA:         1.01 cm MR PISA Eff ROA: 6 mm MR PISA Radius:  0.40 cm MV E velocity: 72.60 cm/s MV A velocity: 138.00 cm/s MV E/A ratio:  0.53 Armida Lander Wallace Electronically signed by Armida Lander Wallace Signature Date/Time: 10/22/2023/1:04:32 PM    Final    CT Angio Chest PE W/Cm &/Or Wo Cm Result Date: 10/21/2023 CLINICAL DATA:  Pulmonary embolus suspected with high probability. Shortness of breath starting 3 hours ago. Worse with exertion. EXAM: CT ANGIOGRAPHY CHEST WITH CONTRAST TECHNIQUE: Multidetector CT imaging of the chest was performed using the standard protocol during bolus administration of intravenous contrast. Multiplanar CT image reconstructions and MIPs were obtained to evaluate the vascular anatomy. RADIATION DOSE REDUCTION: This exam was performed according to the departmental dose-optimization program which includes automated exposure control, adjustment of the mA and/or kV according to patient size and/or use of iterative reconstruction technique. CONTRAST:  75mL OMNIPAQUE  IOHEXOL  350 MG/ML SOLN COMPARISON:  Chest radiograph 10/21/2023.  CT chest 04/07/2021 FINDINGS: Cardiovascular: Pending  the adequate study with good opacification of the central and segmental pulmonary arteries. Moderate motion artifact. No focal filling defects are demonstrated. No evidence of significant pulmonary embolus. Cardiac enlargement. No pericardial effusions. Normal caliber thoracic aorta. No aortic dissection. Calcification of the aorta and coronary arteries. 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 Mediastinum/Nodes: Esophagus is decompressed. No significant lymphadenopathy. Left thyroid  gland nodule measuring 2.6 cm diameter. This is enlarging since prior study. Elective follow-up ultrasound recommended. Lungs/Pleura: Motion artifact. Interstitial changes in the lungs with interlobular septal thickening, progressing since prior study. This is most likely represent edema. No airspace disease or consolidation. No pleural effusion or pneumothorax. Upper Abdomen: Punctate sized stones in the kidneys. No hydronephrosis. Surgical absence of the gallbladder. No acute abnormalities. Musculoskeletal: Degenerative changes in the spine. No acute bony abnormalities. Review of the MIP images confirms the above findings. IMPRESSION: 1. No evidence of significant pulmonary embolus. 2. Diffuse interstitial pattern to the lungs progressing since prior study, likely edema. 3. Cardiac enlargement. 4. Aortic atherosclerosis. 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 No change.  5. 2.6 cm left thyroid  nodule appears to be increasing since prior study. Elective follow-up ultrasound is recommended. 6. Nonobstructing intrarenal stones are incidentally noted. Electronically Signed   By: Boyce Byes M.D.   On: 10/21/2023 21:08   DG Chest 2 View Result Date: 10/21/2023 CLINICAL DATA:  Dyspnea. EXAM: CHEST - 2 VIEW COMPARISON:  Chest x-ray 03/11/2023. CT abdomen and pelvis 06/05/2015. FINDINGS: The heart is enlarged, unchanged. The lungs are clear. There is no  pleural effusion or pneumothorax. No acute fractures are seen. Cholecystectomy clips are present. Right renal calcifications are again seen measuring up to 17 mm. IMPRESSION: 1. No active cardiopulmonary disease. 2. Cardiomegaly. 3. Stable right renal calcifications measuring up to 17 mm. Electronically Signed   By: Tyron Gallon M.D.   On: 10/21/2023 19:17     Discharge Exam: Vitals:   10/22/23 2003 10/23/23 0455  BP: 113/72 (!) 108/54  Pulse: (!) 58 (!) 56  Resp: 16 16  Temp: 98.1 F (36.7 C) 98.2 F (36.8 C)  SpO2: 96% 95%   Vitals:   10/22/23 1229 10/22/23 2003 10/23/23 0455 10/23/23 0500  BP: 118/66 113/72 (!) 108/54   Pulse: (!) 56 (!) 58 (!) 56   Resp: 17 16 16    Temp: 97.8 F (36.6 C) 98.1 F (36.7 C) 98.2 F (36.8 C)   TempSrc: Oral Oral Oral   SpO2: 95% 96% 95%   Weight:    64 kg  Height:        General: Pt is alert, awake, not in acute distress Cardiovascular: RRR, S1/S2 +, no rubs, no gallops Respiratory: CTA bilaterally, no wheezing, no rhonchi Abdominal: Soft, NT, ND, bowel sounds + Extremities: no edema, no cyanosis    The results of significant diagnostics from this hospitalization (including imaging, microbiology, ancillary and laboratory) are listed below for reference.     Microbiology: No results found for this or any previous visit (from the past 240 hours).   Labs: BNP (last 3 results) Recent Labs    10/21/23 1901  BNP 755.0*   Basic Metabolic Panel: Recent Labs  Lab 10/21/23 1901 10/22/23 0339 10/23/23 0549  NA 142 141 141  K 3.6 3.1* 4.3  CL 109 106 107  CO2 22 24 24   GLUCOSE 108* 111* 115*  BUN 20 18 24*  CREATININE 0.70 0.75 0.99  CALCIUM  9.3 9.1 9.0  MG  --  2.0 1.9  PHOS  --  3.7  --    Liver Function Tests: Recent Labs  Lab 10/21/23 1901 10/22/23 0339  AST 19 21  ALT 10 12  ALKPHOS 44 45  BILITOT 0.9 0.9  PROT 5.9* 5.9*  ALBUMIN 3.6 3.8   No results for input(s): "LIPASE", "AMYLASE" in the last 168 hours. No  results for input(s): "AMMONIA" in the last 168 hours. CBC: Recent Labs  Lab 10/21/23 1901 10/22/23 0339 10/23/23 0549  WBC 12.1* 17.1* 14.2*  NEUTROABS 4.1  --   --   HGB 9.3* 10.2* 9.5*  HCT 28.2* 30.8* 29.8*  MCV 97.6 97.5 95.8  PLT 186 235 235   Cardiac Enzymes: No results for input(s): "CKTOTAL", "CKMB", "CKMBINDEX", "TROPONINI" in the last 168 hours. BNP: Invalid input(s): "POCBNP" CBG: No results for input(s): "GLUCAP" in the last 168 hours. D-Dimer No results for input(s): "DDIMER" in the last 72 hours. Hgb A1c No results for input(s): "HGBA1C" in the last 72 hours. Lipid Profile No results for input(s): "CHOL", "HDL", "LDLCALC", "TRIG", "CHOLHDL", "LDLDIRECT" in the last 72 hours.  Thyroid  function studies No results for input(s): "TSH", "T4TOTAL", "T3FREE", "THYROIDAB" in the last 72 hours.  Invalid input(s): "FREET3" Anemia work up No results for input(s): "VITAMINB12", "FOLATE", "FERRITIN", "TIBC", "IRON", "RETICCTPCT" in the last 72 hours. Urinalysis    Component Value Date/Time   COLORURINE YELLOW 06/05/2015 1303   APPEARANCEUR CLEAR 06/05/2015 1303   LABSPEC >1.030 (H) 06/05/2015 1303   PHURINE 5.5 06/05/2015 1303   GLUCOSEU NEGATIVE 06/05/2015 1303   HGBUR MODERATE (A) 06/05/2015 1303   BILIRUBINUR NEGATIVE 06/05/2015 1303   KETONESUR TRACE (A) 06/05/2015 1303   PROTEINUR TRACE (A) 06/05/2015 1303   UROBILINOGEN 0.2 10/17/2008 1128   NITRITE NEGATIVE 06/05/2015 1303   LEUKOCYTESUR NEGATIVE 06/05/2015 1303   Sepsis Labs Recent Labs  Lab 10/21/23 1901 10/22/23 0339 10/23/23 0549  WBC 12.1* 17.1* 14.2*   Microbiology No results found for this or any previous visit (from the past 240 hours).   Time coordinating discharge: 35 minutes  SIGNED:   Cornelius Dill, DO [facility] 10/23/2023, 9:48 AM  If 7PM-7AM, please contact night-coverage [email or web address]

## 2023-10-24 ENCOUNTER — Telehealth: Payer: Self-pay

## 2023-10-24 NOTE — Transitions of Care (Post Inpatient/ED Visit) (Signed)
 10/24/2023  Name: [name] MRN: [id number] DOB: [birth year]  Today's TOC FU Call Status: Today's TOC FU Call Status:: Successful TOC FU Call Completed TOC FU Call Complete Date: 10/24/23 Patient's Name and Date of Birth confirmed.  Transition Care Management Follow-up Telephone Call Date of Discharge: 10/23/23 Discharge [facility]: [facility] Type of Discharge: Inpatient Admission Primary Inpatient Discharge Diagnosis:: Acute on chronic combined systolic and diastolic CHF How have you been since you were released from the hospital?: Better (Reports that she feels good today.) Any questions or concerns?: No  Items Reviewed: Did you receive and understand the discharge instructions provided?: Yes Medications obtained,verified, and reconciled?: Yes (Medications Reviewed) Any new allergies since your discharge?: No Dietary orders reviewed?: Yes Type of Diet Ordered:: Low salt, heart healthy diet Do you have support at home?: Yes People in Home [RPT]: friend(s) Name of Support/Comfort Primary Source: Mary  Medications Reviewed Today: Medications Reviewed Today     Reviewed by Vanetta Generous, RN (Registered Nurse) on 10/24/23 at 1212  Med List Status: <None>   Medication Order Taking? Sig Documenting Provider Last Dose Status Informant  acetaminophen  (TYLENOL ) 500 MG tablet [id number] Yes Take 1,000 mg by mouth every 6 (six) hours as needed for moderate pain or headache. [provider] Taking Active Self  albuterol  (PROVENTIL  HFA;VENTOLIN  HFA) 108 (90 BASE) MCG/ACT inhaler [id number] Yes Inhale 2 puffs into the lungs every 6 (six) hours as needed for shortness of breath. [provider] Taking Active Self  albuterol  (PROVENTIL ) (2.5 MG/3ML) 0.083% nebulizer solution [id number] No Take 3 mLs (2.5 mg total) by nebulization every 6 (six) hours as needed for wheezing or shortness of breath (when not relieved by inhaler.).  Patient not taking: Reported on 10/24/2023    Justina Oman, MD Not Taking Active Self           Med Note Author Board, Elbridge Greek Oct 22, 2023  1:26 PM) Pt stated after she used this medication twice on 10-14-2023 she had a very bad headache that lasted 4 days even with taking Tylenol  to try to help ease it so pt stated she hasn't used it since then  atorvastatin  (LIPITOR) 20 MG tablet [id number] Yes TAKE ONE TABLET BY MOUTH ONCE DAILY. Florette Hurry, NP Taking Active Self  bisoprolol  (ZEBETA ) 5 MG tablet [id number] Yes TAKE (1/2) TABLET BY MOUTH ONCE DAILY.  Patient taking differently: Take 2.5 mg by mouth daily.   Elmyra Haggard, MD Taking Active Self  BLACK COHOSH PO [id number] Yes Take 1 capsule by mouth daily. [provider] Taking Active Self  cholecalciferol  (VITAMIN D3) 25 MCG (1000 UNIT) tablet [id number] Yes Take 1,000-2,000 Units by mouth daily. Alternating between 1-2 tabs every other day (Monday - 2 tabs, Tuesday - 1 tab, Wednesday - 2 tabs, Thursday - 1 tab, Friday - 2 tabs) On Saturday and Sunday it's 1 tab daily Paulett Boros, MD Taking Active Self  diphenhydrAMINE (BENADRYL) 25 MG tablet [id number] Yes Take 25 mg by mouth daily as needed for allergies. [provider] Taking Active Self  doxycycline (VIBRA-TABS) 100 MG tablet [id number] Yes Take 1 tablet (100 mg total) by mouth every 12 (twelve) hours for 5 days. Doreene Gammon D, DO Taking Active   ELIQUIS  5 MG TABS tablet [id number] Yes TAKE 1 TABLET BY MOUTH TWICE A DAY Elmyra Haggard, MD Taking Active Self  ferrous sulfate 325 (65 FE) MG EC tablet [id number] Yes Take 325 mg by mouth 3 (three)  times daily with meals. [provider] Taking Active Self  furosemide  (LASIX ) 20 MG tablet [id number] Yes Take 1 tablet (20 mg total) by mouth daily as needed for fluid or edema (shortness of breath). Mason Sole, Pratik D, DO Taking Active   losartan  (COZAAR ) 25 MG tablet [id number] Yes TAKE (1/2) TABLET BY MOUTH DAILY  Patient taking differently: Take  12.5 mg by mouth daily. TAKE (1/2) TABLET BY MOUTH DAILY   Elmyra Haggard, MD Taking Active Self  Misc. Devices MISC [id number] Yes Please provide patient with breast prosthesis and mastectomy bra. Dx: L24.401 Paulett Boros, MD Taking Active Self  omeprazole  (PRILOSEC) 20 MG capsule [id number] Yes Take 1 capsule (20 mg total) by mouth daily. Tobi Fortes, MD Taking Active Self  ondansetron  (ZOFRAN ) 4 MG tablet [id number] Yes Take 1 tablet (4 mg total) by mouth every 8 (eight) hours as needed for nausea or vomiting.  Patient taking differently: Take 4 mg by mouth 3 (three) times daily as needed for nausea or vomiting.   Dorma Gash, PA-C Taking Active Self  spironolactone  (ALDACTONE ) 25 MG tablet [id number] Yes Take 0.5 tablets (12.5 mg total) by mouth daily. Mason Sole, Pratik D, DO Taking Active   VEOZAH  45 MG TABS [id number] No Take 1 tablet (45 mg total) by mouth daily.  Patient not taking: Reported on 10/22/2023   Tobi Fortes, MD Not Taking Active Self           Med Note Author Board, Elbridge Greek Oct 22, 2023  1:25 PM) Pt stated this medication she has not been able to take recently d/t Rx needing a PA (prior authorization), pt stated she REALLY needs this medication because she had one of her ovaries removed and gets very bad night sweats from hot flashes.            Home Care and Equipment/Supplies: Were Home Health Services Ordered?: No Any new equipment or medical supplies ordered?: No  Functional Questionnaire: Do you need assistance with bathing/showering or dressing?: No Do you need assistance with meal preparation?: No Do you need assistance with eating?: No Do you have difficulty maintaining continence: No Do you need assistance with getting out of bed/getting out of a chair/moving?: No Do you have difficulty managing or taking your medications?: No  Follow up appointments reviewed: PCP Follow-up appointment confirmed?: Yes Date of PCP follow-up appointment?:  11/03/23 Follow-up Provider: New doctor replacing Dr. Kermit Ped [facility] Follow-up appointment confirmed?: Yes Date of Specialist follow-up appointment?: 11/16/23 Follow-Up Specialty Provider[facility] Do you need transportation to your follow-up appointment?: No Do you understand care options if your condition(s) worsen?: Yes-patient verbalized understanding  SDOH Interventions Today    Flowsheet Row Most Recent Value  SDOH Interventions   Food Insecurity Interventions Intervention Not Indicated  Housing Interventions Intervention Not Indicated  Transportation Interventions Intervention Not Indicated  Utilities Interventions Intervention Not Indicated      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   Reviewed with patient the importance of daily weights. She is currently not weighing daily.  Reviewed with patient the importance of finishing her antibiotics.  Encouraged patient to call PCP for any new problems or concerns.  Patient currently denies any concerns today.  Reviewed and offered 30 day TOC program and patient declined.  Orpha Blade, RN, BSN, CEN [employer]- Transition of Care Team.  [facility] [phone]

## 2023-10-25 ENCOUNTER — Encounter: Payer: Self-pay | Admitting: Internal Medicine

## 2023-10-25 DIAGNOSIS — H9191 Unspecified hearing loss, right ear: Secondary | ICD-10-CM | POA: Insufficient documentation

## 2023-10-25 NOTE — Assessment & Plan Note (Addendum)
 Regular rate and rhythm on exam again today.  She remains on Eliquis  and bisoprolol .  Cardiology follow-up is scheduled for July.

## 2023-10-25 NOTE — Assessment & Plan Note (Addendum)
 She remains euvolemic on exam today.  Cardiology follow-up is scheduled for July.

## 2023-10-25 NOTE — Assessment & Plan Note (Signed)
 She continues to experience hot flashes regularly.  Veozah  has previously been effective for symptom relief, however she is currently off Veozah  due to a lack of insurance coverage.  She has instead been using black cohosh. -Resume Veozah .  New prescription sent today.

## 2023-10-25 NOTE — Assessment & Plan Note (Signed)
 Stable.  Pulmonary exam unremarkable.  She continues to use albuterol  infrequently.

## 2023-10-25 NOTE — Assessment & Plan Note (Signed)
 Lipid panel updated in September 2024 reflects adequate control with atorvastatin 20 mg daily.

## 2023-10-25 NOTE — Assessment & Plan Note (Signed)
 Her acute concern today is hearing loss of the right ear x 1 year.  Audiology referral placed today for further evaluation.

## 2023-11-03 ENCOUNTER — Ambulatory Visit (INDEPENDENT_AMBULATORY_CARE_PROVIDER_SITE_OTHER): Payer: Self-pay

## 2023-11-03 VITALS — BP 103/68 | HR 66 | Ht 61.0 in | Wt 142.1 lb

## 2023-11-03 DIAGNOSIS — H6121 Impacted cerumen, right ear: Secondary | ICD-10-CM

## 2023-11-03 DIAGNOSIS — I509 Heart failure, unspecified: Secondary | ICD-10-CM

## 2023-11-03 NOTE — Progress Notes (Unsigned)
   Established Patient Office Visit  Subjective   Patient ID: JADELYNN BOYLAN, female    DOB: 03-26-1936  Age: 88 y.o. MRN: [id number]  Chief Complaint  Patient presents with   Medical Management of Chronic Issues    Pt here for states  Hospital follow up due to fluid on her lungs    HPI  Follow up Hospitalization  10/21/23-10/23/23  Brief/Interim Summary: CHRYSTLE MURILLO is a 88 y.o. female with medical history significant of chronic HFrEF, CAD, hyperlipidemia, paroxysmal atrial fibrillation, COPD, CLL who presents to the emergency department due to worsening shortness of breath that has been going for about a week.  Patient has been admitted for acute on chronic systolic and diastolic CHF exacerbation and is noted to also have left leg cellulitis with tick that was removed in the ED on admission.  She was placed on diuresis and no longer has shortness of breath or any other symptoms and appears euvolemic.  2D echocardiogram reveals LVEF 50-55% with grade 1 diastolic dysfunction and she will now remain on Lasix  as needed with continuation of her other usual home medications.  She is scheduled to see Dr. Avanell Bob 7/17.  Cellulitis appears stable and improving and she will remain on doxycycline  as prescribed.  No other acute events or concerns noted.   Discharge Diagnoses:  Principal Problem:   Acute on chronic combined systolic and diastolic CHF (congestive heart failure) (HCC) Active Problems:   Mixed hyperlipidemia   COPD (chronic obstructive pulmonary disease) (HCC)   CAD (coronary artery disease)   GERD without esophagitis   Persistent atrial fibrillation (HCC)   Left leg cellulitis   Principal discharge diagnosis: Acute on chronic diastolic CHF exacerbation with left leg cellulitis in the setting of tick bite. ----------------------------------------------------------------------------------------- -  {History (Optional):23778}  ROS    Objective:     BP 103/68   Pulse 66    Ht 5' 1 (1.549 m)   Wt 142 lb 1.3 oz (64.4 kg)   SpO2 94%   BMI 26.85 kg/m  BP Readings from Last 3 Encounters:  11/03/23 103/68  10/23/23 (!) 108/54  10/09/23 111/75   Wt Readings from Last 3 Encounters:  11/03/23 142 lb 1.3 oz (64.4 kg)  10/23/23 141 lb 1.5 oz (64 kg)  10/09/23 149 lb 12.8 oz (67.9 kg)      Physical Exam   No results found for any visits on 11/03/23.  {Labs (Optional):23779}  The ASCVD Risk score (Arnett DK, et al., 2019) failed to calculate for the following reasons:   The 2019 ASCVD risk score is only valid for ages 23 to 53    Assessment & Plan:   Problem List Items Addressed This Visit       Nervous and Auditory   Hearing loss of right ear - Primary    No follow-ups on file.    Alison Irvine, FNP

## 2023-11-04 ENCOUNTER — Other Ambulatory Visit: Payer: Self-pay | Admitting: Internal Medicine

## 2023-11-04 LAB — BMP8+EGFR
BUN/Creatinine Ratio: 28 (ref 12–28)
BUN: 38 mg/dL — ABNORMAL HIGH (ref 8–27)
CO2: 20 mmol/L (ref 20–29)
Calcium: 9.2 mg/dL (ref 8.7–10.3)
Chloride: 102 mmol/L (ref 96–106)
Creatinine, Ser: 1.35 mg/dL — ABNORMAL HIGH (ref 0.57–1.00)
Glucose: 100 mg/dL — ABNORMAL HIGH (ref 70–99)
Potassium: 4 mmol/L (ref 3.5–5.2)
Sodium: 141 mmol/L (ref 134–144)
eGFR: 38 mL/min/{1.73_m2} — ABNORMAL LOW (ref >59)

## 2023-11-06 ENCOUNTER — Ambulatory Visit: Payer: Self-pay

## 2023-11-06 NOTE — Assessment & Plan Note (Signed)
 Recent hospitalization records reviewed.  Patient reports some improvement of symptoms, however still complains of shortness of breath and fatigue. Recheck BMP as recommended. She has follow-up appointment with cardiology next month.

## 2023-11-08 ENCOUNTER — Other Ambulatory Visit: Payer: Self-pay

## 2023-11-08 DIAGNOSIS — C9110 Chronic lymphocytic leukemia of B-cell type not having achieved remission: Secondary | ICD-10-CM

## 2023-11-08 DIAGNOSIS — D649 Anemia, unspecified: Secondary | ICD-10-CM

## 2023-11-09 ENCOUNTER — Other Ambulatory Visit: Payer: Medicare HMO

## 2023-11-09 ENCOUNTER — Other Ambulatory Visit (HOSPITAL_COMMUNITY): Payer: Self-pay | Admitting: Hematology

## 2023-11-09 ENCOUNTER — Inpatient Hospital Stay: Payer: Medicare HMO | Attending: Hematology

## 2023-11-09 DIAGNOSIS — E559 Vitamin D deficiency, unspecified: Secondary | ICD-10-CM | POA: Insufficient documentation

## 2023-11-09 DIAGNOSIS — D649 Anemia, unspecified: Secondary | ICD-10-CM

## 2023-11-09 DIAGNOSIS — Z923 Personal history of irradiation: Secondary | ICD-10-CM | POA: Insufficient documentation

## 2023-11-09 DIAGNOSIS — N951 Menopausal and female climacteric states: Secondary | ICD-10-CM | POA: Insufficient documentation

## 2023-11-09 DIAGNOSIS — Z87891 Personal history of nicotine dependence: Secondary | ICD-10-CM | POA: Insufficient documentation

## 2023-11-09 DIAGNOSIS — Z9221 Personal history of antineoplastic chemotherapy: Secondary | ICD-10-CM | POA: Insufficient documentation

## 2023-11-09 DIAGNOSIS — C9110 Chronic lymphocytic leukemia of B-cell type not having achieved remission: Secondary | ICD-10-CM | POA: Insufficient documentation

## 2023-11-09 DIAGNOSIS — Z853 Personal history of malignant neoplasm of breast: Secondary | ICD-10-CM | POA: Insufficient documentation

## 2023-11-09 DIAGNOSIS — Z1231 Encounter for screening mammogram for malignant neoplasm of breast: Secondary | ICD-10-CM

## 2023-11-09 DIAGNOSIS — Z9011 Acquired absence of right breast and nipple: Secondary | ICD-10-CM | POA: Diagnosis not present

## 2023-11-09 LAB — CBC WITH DIFFERENTIAL/PLATELET
Abs Immature Granulocytes: 0 10*3/uL (ref 0.00–0.07)
Basophils Absolute: 0 10*3/uL (ref 0.0–0.1)
Basophils Relative: 0 %
Eosinophils Absolute: 0.6 10*3/uL — ABNORMAL HIGH (ref 0.0–0.5)
Eosinophils Relative: 3 %
HCT: 30.6 % — ABNORMAL LOW (ref 36.0–46.0)
Hemoglobin: 9.4 g/dL — ABNORMAL LOW (ref 12.0–15.0)
Lymphocytes Relative: 71 %
Lymphs Abs: 15.3 10*3/uL — ABNORMAL HIGH (ref 0.7–4.0)
MCH: 31.1 pg (ref 26.0–34.0)
MCHC: 30.7 g/dL (ref 30.0–36.0)
MCV: 101.3 fL — ABNORMAL HIGH (ref 80.0–100.0)
Monocytes Absolute: 1.1 10*3/uL — ABNORMAL HIGH (ref 0.1–1.0)
Monocytes Relative: 5 %
Neutro Abs: 4.5 10*3/uL (ref 1.7–7.7)
Neutrophils Relative %: 21 %
Platelets: 213 10*3/uL (ref 150–400)
RBC: 3.02 MIL/uL — ABNORMAL LOW (ref 3.87–5.11)
RDW: 14.8 % (ref 11.5–15.5)
WBC: 21.5 10*3/uL — ABNORMAL HIGH (ref 4.0–10.5)
nRBC: 0 % (ref 0.0–0.2)

## 2023-11-09 LAB — COMPREHENSIVE METABOLIC PANEL WITH GFR
ALT: 12 U/L (ref 0–44)
AST: 23 U/L (ref 15–41)
Albumin: 3.9 g/dL (ref 3.5–5.0)
Alkaline Phosphatase: 46 U/L (ref 38–126)
Anion gap: 13 (ref 5–15)
BUN: 28 mg/dL — ABNORMAL HIGH (ref 8–23)
CO2: 19 mmol/L — ABNORMAL LOW (ref 22–32)
Calcium: 9 mg/dL (ref 8.9–10.3)
Chloride: 106 mmol/L (ref 98–111)
Creatinine, Ser: 1.33 mg/dL — ABNORMAL HIGH (ref 0.44–1.00)
GFR, Estimated: 39 mL/min — ABNORMAL LOW (ref >60)
Glucose, Bld: 143 mg/dL — ABNORMAL HIGH (ref 70–99)
Potassium: 4.3 mmol/L (ref 3.5–5.1)
Sodium: 138 mmol/L (ref 135–145)
Total Bilirubin: 0.8 mg/dL (ref 0.0–1.2)
Total Protein: 6.1 g/dL — ABNORMAL LOW (ref 6.5–8.1)

## 2023-11-09 LAB — IRON AND TIBC
Iron: 307 ug/dL — ABNORMAL HIGH (ref 28–170)
Saturation Ratios: 82 % — ABNORMAL HIGH (ref 10.4–31.8)
TIBC: 376 ug/dL (ref 250–450)
UIBC: 69 ug/dL

## 2023-11-09 LAB — VITAMIN D 25 HYDROXY (VIT D DEFICIENCY, FRACTURES): Vit D, 25-Hydroxy: 32.25 ng/mL (ref 30–100)

## 2023-11-09 LAB — FERRITIN: Ferritin: 54 ng/mL (ref 11–307)

## 2023-11-09 LAB — LACTATE DEHYDROGENASE: LDH: 136 U/L (ref 98–192)

## 2023-11-15 NOTE — Progress Notes (Unsigned)
 [facility] [address], [place] [address]    Clinic Day:  11/16/2023  Referring physician: Melvenia Manus BRAVO, MD  Patient Care Team: Bevely Doffing, FNP as PCP - General (Family Medicine) Okey Vina GAILS, MD as PCP - Cardiology (Cardiology) Melodi Lerner, MD as Consulting Physician (Orthopedic Surgery)   ASSESSMENT & PLAN:   Assessment: 1.  CLL: -Patient was diagnosed 25+ years ago.  Has never required treatment. -Patient reports ongoing hot flashes and night sweats.  She denies any adenopathy.  No other B symptoms. -On 03/26/2019 patient had a CT scan of her chest which revealed a 17 mm irregular soft tissue density along with the medial aspect of the left lung base. -CT of chest on 04/23/2019 showed interval resolution of previously demonstrated small pleural effusions.  No suspicious pulmonary nodules.     2.  Stage III right breast cancer: -Patient was diagnosed in 1980 /1999. -Treated with neoadjuvant chemotherapy followed by right mastectomy then XRT.    Plan: 1. CLL: - She denies any fevers, night sweats or weight loss in the last 6 months. - Physical exam: No palpable adenopathy in the neck and axillary region. - Hot flashes improved on Veozah . - Reviewed labs from 11/09/23 : Normal LDH.  White count is 21.5, hemoglobin 9.4 (10.2).  There is a 1 g drop of hemoglobin since December and there was a 2 g drop at his visit in December.  Iron levels in December showed a ferritin of 13 with iron saturation of 11%.  Repeat labs from 11/03/2023 show an improvement of her iron levels but persistent drop in her hemoglobin.  She is currently taking 3 iron tablets daily along with a stool softener. No GI issues.  I would like to recheck her labs in 3 months to add B12 and folate levels at that time.  Recommend she decrease her iron tabs to once per day. Continue her stool softener.  She denies any bleeding.   -Return in 3 months with labs only and in 6 months with  labs and office visit.    2.  Stage III right breast cancer: - Left breast mammogram on 06/16/2022: BI-RADS Category 1.  She will have mammogram in January.   3.  Vitamin D  deficiency: - Continue vitamin D  2000 units Monday Wednesday and Friday and 1000 units rest of the week. - Vitamin D  level is normal at 32.25.     PLAN SUMMARY: >> Lab only in 3 months to follow-up on anemia. >> Return to clinic in 6 months with labs a few days before and office visit. >> Mammogram in January already scheduled. >> Reduce iron tablets down to 1/day.  Continue stool softener.     Orders Placed This Encounter  Procedures   Comprehensive metabolic panel    Standing Status:   Future    Expected Date:   02/16/2024    Expiration Date:   11/15/2024    Release to patient:   Immediate   CBC with Differential/Platelet    Standing Status:   Future    Expected Date:   02/16/2024    Expiration Date:   11/15/2024    Release to patient:   Immediate   Vitamin B12    Standing Status:   Future    Expected Date:   02/16/2024    Expiration Date:   11/15/2024   Methylmalonic acid, serum    Standing Status:   Future    Expected Date:   02/16/2024  Expiration Date:   11/15/2024   Copper, serum    Standing Status:   Future    Expected Date:   02/16/2024    Expiration Date:   11/15/2024   Ferritin    Standing Status:   Future    Expected Date:   02/16/2024    Expiration Date:   11/15/2024    Delon FORBES Hope, NP   6/26/20259:39 AM  CHIEF COMPLAINT:   Diagnosis: CLL    Cancer Staging  No matching staging information was found for the patient.    Prior Therapy: none  Current Therapy:  surveillance    HISTORY OF PRESENT ILLNESS:   Oncology History   No history exists.     INTERVAL HISTORY:   [name] is a 88 y.o. female presenting to clinic today for follow up of CLL. She was last last seen by Dr. Rogers on 05/10/2024.  Since her last visit she was hospitalized from 10/21/2023 through 10/23/2023  for CHF.  She was diuresed with improvement of her shortness of breath.  She developed left leg cellulitis due to a tick bite.  She was placed on doxycycline .  Reports she is feeling much better since her hospitalization.  Reports she has been dealing with decreased hearing in her right ear.  She has a hearing test scheduled for Monday.  Notes some associated dizziness.  No falls.  Reports she is currently taking iron supplements 3/day along with a stool softener.  Denies constipation or GI upset.  Today, overall she feels like she is doing well.  Notes her appetite is at 100% energy levels are 75%.  She denies any bleeding that she has noticed in her stools.  She continues Veozah  45 mg daily to help manage her hot flashes which continues to help.  She is taking vitamin D  supplements.  She completed her doxycycline  last week for cellulitis.  She has not noticed any new lumps or bumps.  She will be due for a repeat mammogram in January which is already been scheduled.  PAST MEDICAL HISTORY:   Past Medical History: Past Medical History:  Diagnosis Date   Bowel obstruction (HCC)    Breast cancer (HCC)    Breast cancer, stage 3 (HCC) 07/21/2011   Bronchitis    CAD (coronary artery disease)    a. 06/2020 Cath: LM nl, LAD 60m, LCX min irregs, RCA mild diff dzs. PA 34/15(21), PCWP 15. CO 5.0 L/min. EF 25-35%, glob HK.   Chronic heart failure with preserved ejection fraction (HFpEF) (HCC)    a. 06/2018 Echo: EF 55-60%; b. 03/2019 Echo: EF 40-45%; c. 05/2020 Echo: EF 35-40%; d. 12/2020 Echo: EF 35-40%; e. 03/2021 Echo: EF 35-40%. mod LVH,GrI DD, nl RV fxn, mod dil LA/RA, mild MR/AS.   CLL (chronic lymphocytic leukemia) (HCC) 07/21/2011   Migraines    NICM (nonischemic cardiomyopathy) (HCC)    PAF (paroxysmal atrial fibrillation) (HCC)    a. Dx 06/2018 in setting of COPD flare-->bb/eliquis  (CHA2DS2VASc = 6); b. 02/2020 Zio: Predominantly sinus rhythm at an average heart rate of 60 (39-190).  3% atrial  fibrillation burden and average of 127 (59-1 and 37), longest lasting 9 hours and 23 minutes.  43 runs of PSVT noted.  3 runs of nonsustained VT noted.   Pneumonia    PSVT (paroxysmal supraventricular tachycardia) (HCC)    a. 02/2020 Zio: 43 rounds of symptomatic PSVT.  Longest 19 beats; fastest 164 bpm x 14 beats.    Surgical History: Past Surgical History:  Procedure  Laterality Date   ABDOMINAL HYSTERECTOMY     APPENDECTOMY     CATARACT EXTRACTION Bilateral 2023   CHOLECYSTECTOMY     LAPAROTOMY N/A 06/08/2015   Procedure: EXPLORATORY LAPAROTOMY;  Surgeon: Oneil Budge, MD;  Location: [facility];  Service: General;  Laterality: N/A;   LYSIS OF ADHESION N/A 06/08/2015   Procedure: LYSIS OF ADHESION;  Surgeon: Oneil Budge, MD;  Location: [facility];  Service: General;  Laterality: N/A;   MASTECTOMY     right   RIGHT/LEFT HEART CATH AND CORONARY ANGIOGRAPHY N/A 07/09/2020   Procedure: RIGHT/LEFT HEART CATH AND CORONARY ANGIOGRAPHY;  Surgeon: Mady Bruckner, MD;  Location: [facility];  Service: Cardiovascular;  Laterality: N/A;   TOTAL HIP ARTHROPLASTY     right    Social History: Social History   Socioeconomic History   Marital status: Widowed    Spouse name: Not on file   Number of children: Not on file   Years of education: 11   Highest education level: Not on file  Occupational History   Not on file  Tobacco Use   Smoking status: Former    Current packs/day: 0.00    Types: Cigarettes    Quit date: 10/26/1978    Years since quitting: 45.0   Smokeless tobacco: Never  Vaping Use   Vaping status: Never Used  Substance and Sexual Activity   Alcohol use: No   Drug use: No   Sexual activity: Not Currently  Other Topics Concern   Not on file  Social History Narrative   Pt lives by herself   Social Drivers of Health   Financial Resource Strain: Low Risk  (02/21/2023)   Overall Financial Resource Strain (CARDIA)    Difficulty of Paying Living Expenses: Not hard at all   Food Insecurity: No Food Insecurity (10/24/2023)   Hunger Vital Sign    Worried About Running Out of Food in the Last Year: Never true    Ran Out of Food in the Last Year: Never true  Transportation Needs: No Transportation Needs (10/24/2023)   PRAPARE - Administrator, Civil Service (Medical): No    Lack of Transportation (Non-Medical): No  Physical Activity: Insufficiently Active (02/21/2023)   Exercise Vital Sign    Days of Exercise per Week: 2 days    Minutes of Exercise per Session: 20 min  Stress: No Stress Concern Present (02/21/2023)   [employer] of Occupational Health - Occupational Stress Questionnaire    Feeling of Stress : Not at all  Social Connections: Moderately Isolated (10/21/2023)   Social Connection and Isolation Panel    Frequency of Communication with Friends and Family: More than three times a week    Frequency of Social Gatherings with Friends and Family: More than three times a week    Attends Religious Services: More than 4 times per year    Active Member of [employer] or Organizations: No    Attends Banker Meetings: Never    Marital Status: Widowed  Intimate Partner Violence: Not At Risk (10/24/2023)   Humiliation, Afraid, Rape, and Kick questionnaire    Fear of Current or Ex-Partner: No    Emotionally Abused: No    Physically Abused: No    Sexually Abused: No    Family History: Family History  Problem Relation Age of Onset   Cancer Brother    Cancer Other     Current Medications:  Current Outpatient Medications:    acetaminophen  (TYLENOL ) 500 MG tablet, Take  1,000 mg by mouth every 6 (six) hours as needed for moderate pain or headache., Disp: , Rfl:    albuterol  (PROVENTIL  HFA;VENTOLIN  HFA) 108 (90 BASE) MCG/ACT inhaler, Inhale 2 puffs into the lungs every 6 (six) hours as needed for shortness of breath., Disp: , Rfl:    atorvastatin  (LIPITOR) 20 MG tablet, TAKE ONE TABLET BY MOUTH ONCE DAILY., Disp: 90 tablet, Rfl: 3    bisoprolol  (ZEBETA ) 5 MG tablet, TAKE (1/2) TABLET BY MOUTH ONCE DAILY. (Patient taking differently: Take 2.5 mg by mouth daily.), Disp: 45 tablet, Rfl: 2   BLACK COHOSH PO, Take 1 capsule by mouth daily., Disp: , Rfl:    cholecalciferol  (VITAMIN D3) 25 MCG (1000 UNIT) tablet, Take 1,000-2,000 Units by mouth daily. Alternating between 1-2 tabs every other day (Monday - 2 tabs, Tuesday - 1 tab, Wednesday - 2 tabs, Thursday - 1 tab, Friday - 2 tabs) On Saturday and Sunday it's 1 tab daily, Disp: , Rfl:    diphenhydrAMINE  (BENADRYL ) 25 MG tablet, Take 25 mg by mouth daily as needed for allergies., Disp: , Rfl:    ELIQUIS  5 MG TABS tablet, TAKE 1 TABLET BY MOUTH TWICE A DAY, Disp: 180 tablet, Rfl: 1   ferrous sulfate  325 (65 FE) MG EC tablet, Take 325 mg by mouth 3 (three) times daily with meals., Disp: , Rfl:    furosemide  (LASIX ) 20 MG tablet, Take 1 tablet (20 mg total) by mouth daily as needed for fluid or edema (shortness of breath)., Disp: 30 tablet, Rfl: 2   losartan  (COZAAR ) 25 MG tablet, TAKE (1/2) TABLET BY MOUTH DAILY (Patient taking differently: Take 12.5 mg by mouth daily. TAKE (1/2) TABLET BY MOUTH DAILY), Disp: 45 tablet, Rfl: 3   Misc. Devices MISC, Please provide patient with breast prosthesis and mastectomy bra. Dx: C50.911, Disp: 1 each, Rfl: 0   omeprazole  (PRILOSEC) 20 MG capsule, Take 1 capsule (20 mg total) by mouth daily., Disp: 90 capsule, Rfl: 0   ondansetron  (ZOFRAN ) 4 MG tablet, Take 1 tablet (4 mg total) by mouth every 8 (eight) hours as needed for nausea or vomiting. (Patient taking differently: Take 4 mg by mouth 3 (three) times daily as needed for nausea or vomiting.), Disp: 20 tablet, Rfl: 0   spironolactone  (ALDACTONE ) 25 MG tablet, Take 0.5 tablets (12.5 mg total) by mouth daily., Disp: 45 tablet, Rfl: 3   VEOZAH  45 MG TABS, Take 1 tablet (45 mg total) by mouth daily., Disp: 30 tablet, Rfl: 0   Allergies: Allergies  Allergen Reactions   Albuterol  Other (See Comments)     Pt stated after she used the albuterol  nebulizer solution on 10-14-2023 she had a very bad headache that didn't go away for 4 days even with taking Tylenol  to try to help   Codeine Nausea And Vomiting    Headache    Entresto [Sacubitril-Valsartan] Other (See Comments)    Unknown    Jardiance  [Empagliflozin ] Other (See Comments)    Unknown    Meclizine Other (See Comments)    Made dizziness worse.   Sulfa Antibiotics Nausea And Vomiting    Stomach upset   Zoloft [Sertraline Hcl] Anxiety    REVIEW OF SYSTEMS:   Review of Systems  Cardiovascular:  Positive for chest pain.  Gastrointestinal:  Positive for nausea and vomiting.  Neurological:  Positive for dizziness and headaches.  Psychiatric/Behavioral:  Positive for sleep disturbance.      VITALS:   Blood pressure 101/66, pulse (!) 58, temperature (!) 97.4  F (36.3 C), temperature source Oral, resp. rate 16, weight 143 lb 12.8 oz (65.2 kg), SpO2 99%.  Wt Readings from Last 3 Encounters:  11/16/23 143 lb 12.8 oz (65.2 kg)  11/03/23 142 lb 1.3 oz (64.4 kg)  10/23/23 141 lb 1.5 oz (64 kg)    Body mass index is 27.17 kg/m.  Performance status (ECOG): 1 - Symptomatic but completely ambulatory  PHYSICAL EXAM:   Physical Exam Constitutional:      Appearance: Normal appearance.   Cardiovascular:     Rate and Rhythm: Normal rate and regular rhythm.  Pulmonary:     Effort: Pulmonary effort is normal.     Breath sounds: Normal breath sounds.  Abdominal:     General: Bowel sounds are normal.     Palpations: Abdomen is soft.   Musculoskeletal:        General: No swelling. Normal range of motion.  Lymphadenopathy:     Cervical: No cervical adenopathy.     Upper Body:     Right upper body: No axillary adenopathy.     Left upper body: No axillary adenopathy.   Neurological:     Mental Status: She is alert and oriented to person, place, and time. Mental status is at baseline.     LABS:      Latest Ref Rng & Units  11/09/2023    2:12 PM 10/23/2023    5:49 AM 10/22/2023    3:39 AM  CBC  WBC 4.0 - 10.5 K/uL 21.5  14.2  17.1   Hemoglobin 12.0 - 15.0 g/dL 9.4  9.5  89.7   Hematocrit 36.0 - 46.0 % 30.6  29.8  30.8   Platelets 150 - 400 K/uL 213  235  235       Latest Ref Rng & Units 11/09/2023    2:12 PM 11/03/2023   11:54 AM 10/23/2023    5:49 AM  CMP  Glucose 70 - 99 mg/dL 856  899  884   BUN 8 - 23 mg/dL 28  38  24   Creatinine 0.44 - 1.00 mg/dL 8.66  8.64  9.00   Sodium 135 - 145 mmol/L 138  141  141   Potassium 3.5 - 5.1 mmol/L 4.3  4.0  4.3   Chloride 98 - 111 mmol/L 106  102  107   CO2 22 - 32 mmol/L 19  20  24    Calcium  8.9 - 10.3 mg/dL 9.0  9.2  9.0   Total Protein 6.5 - 8.1 g/dL 6.1     Total Bilirubin 0.0 - 1.2 mg/dL 0.8     Alkaline Phos 38 - 126 U/L 46     AST 15 - 41 U/L 23     ALT 0 - 44 U/L 12        Lab Results  Component Value Date   CEA 0.8 06/28/2013   /  CEA  Date Value Ref Range Status  06/28/2013 0.8 0.0 - 5.0 ng/mL Final    Comment:    Performed at [facility]   No results found for: PSA1 No results found for: CAN199 No results found for: CAN125  No results found for: STEPHANY RINGS, A1GS, A2GS, BETS, BETA2SER, GAMS, MSPIKE, SPEI Lab Results  Component Value Date   TIBC 376 11/09/2023   TIBC 424 05/11/2023   FERRITIN 54 11/09/2023   FERRITIN 13 05/11/2023   IRONPCTSAT 82 (H) 11/09/2023   IRONPCTSAT 11 05/11/2023   Lab Results  Component Value Date  LDH 136 11/09/2023   LDH 147 05/04/2023   LDH 141 11/02/2022     STUDIES:   ECHOCARDIOGRAM COMPLETE Result Date: 10/22/2023    ECHOCARDIOGRAM REPORT   Patient Name:   [name] Date of Exam: 10/22/2023 Medical Rec #:  [id number]         Height:       61.0 in Accession #:    [id number]        Weight:       149.9 lb Date of Birth:  08/19/1935         BSA:          1.671 m Patient Age:    88 years          BP:           125/72 mmHg Patient Gender: F                  HR:           55 bpm. Exam Location:  Outpatient Procedure: 2D Echo, 3D Echo, Color Doppler and Cardiac Doppler (Both Spectral            and Color Flow Doppler were utilized during procedure). Indications:    CHF  History:        Patient has prior history of Echocardiogram examinations, most                 recent 04/08/2021. COPD, Arrythmias:Atrial Fibrillation; Risk                 Factors:Dyslipidemia and Former Smoker. HFrEF; Breast cancer.  Sonographer:    Orvil Holmes RDCS Referring Phys: [id number] OLADAPO ADEFESO IMPRESSIONS  1. Left ventricular ejection fraction, by estimation, is 50 to 55%. The left ventricle has low normal function. Left ventricular endocardial border not optimally defined to evaluate regional wall motion. Left ventricular diastolic parameters are consistent with Grade I diastolic dysfunction (impaired relaxation). The average left ventricular global longitudinal strain is -20.6 %. The global longitudinal strain is normal.  2. Right ventricular systolic function is normal. The right ventricular size is normal.  3. Left atrial size was severely dilated.  4. The mitral valve is abnormal. Mild mitral valve regurgitation. Mild mitral stenosis. The mean mitral valve gradient is 3.0 mmHg. Moderate mitral annular calcification.  5. The aortic valve is tricuspid. There is mild calcification of the aortic valve. There is mild thickening of the aortic valve. Aortic valve regurgitation is not visualized. No aortic stenosis is present.  6. The inferior vena cava is normal in size with greater than 50% respiratory variability, suggesting right atrial pressure of 3 mmHg. FINDINGS  Left Ventricle: Left ventricular ejection fraction, by estimation, is 50 to 55%. The left ventricle has low normal function. Left ventricular endocardial border not optimally defined to evaluate regional wall motion. The average left ventricular global longitudinal strain is -20.6 %. Strain was performed and the global  longitudinal strain is normal. The left ventricular internal cavity size was normal in size. There is no left ventricular hypertrophy. Left ventricular diastolic parameters are consistent with Grade I diastolic dysfunction (impaired relaxation). Normal left ventricular filling pressure. Right Ventricle: The right ventricular size is normal. Right vetricular wall thickness was not well visualized. Right ventricular systolic function is normal. Left Atrium: Left atrial size was severely dilated. Right Atrium: Right atrial size was normal in size. Pericardium: There is no evidence of pericardial effusion. Mitral Valve: The mitral valve is abnormal. There is moderate  thickening of the mitral valve leaflet(s). There is moderate calcification of the mitral valve leaflet(s). Moderate mitral annular calcification. Mild mitral valve regurgitation. Mild mitral valve stenosis. The mean mitral valve gradient is 3.0 mmHg. Tricuspid Valve: The tricuspid valve is normal in structure. Tricuspid valve regurgitation is mild . No evidence of tricuspid stenosis. Aortic Valve: The aortic valve is tricuspid. There is mild calcification of the aortic valve. There is mild thickening of the aortic valve. There is mild aortic valve annular calcification. Aortic valve regurgitation is not visualized. No aortic stenosis  is present. Aortic valve mean gradient measures 8.2 mmHg. Aortic valve peak gradient measures 15.1 mmHg. Aortic valve area, by VTI measures 1.93 cm. Pulmonic Valve: The pulmonic valve was not well visualized. Pulmonic valve regurgitation is trivial. No evidence of pulmonic stenosis. Aorta: The aortic root and ascending aorta are structurally normal, with no evidence of dilitation. Venous: The inferior vena cava is normal in size with greater than 50% respiratory variability, suggesting right atrial pressure of 3 mmHg. IAS/Shunts: No atrial level shunt detected by color flow Doppler. Additional Comments: 3D was performed not  requiring image post processing on an independent workstation and was normal.  LEFT VENTRICLE PLAX 2D LVIDd:         5.60 cm   Diastology LVIDs:         3.50 cm   LV e' medial:    4.90 cm/s LV PW:         0.90 cm   LV E/e' medial:  14.8 LV IVS:        0.90 cm   LV e' lateral:   6.64 cm/s LVOT diam:     2.10 cm   LV E/e' lateral: 10.9 LV SV:         83 LV SV Index:   50        2D Longitudinal Strain LVOT Area:     3.46 cm  2D Strain GLS Avg:     -20.6 %                           3D Volume EF:                          3D EF:        56 %                          LV EDV:       188 ml                          LV ESV:       82 ml                          LV SV:        106 ml RIGHT VENTRICLE RV Basal diam:  2.80 cm RV Mid diam:    1.60 cm RV S prime:     11.50 cm/s TAPSE (M-mode): 2.7 cm LEFT ATRIUM              Index        RIGHT ATRIUM           Index LA diam:        2.70 cm  1.62 cm/m   RA Area:  11.80 cm LA Vol (A2C):   118.0 ml 70.62 ml/m  RA Volume:   25.30 ml  15.14 ml/m LA Vol (A4C):   114.0 ml 68.22 ml/m LA Biplane Vol: 124.0 ml 74.21 ml/m  AORTIC VALVE AV Area (Vmax):    1.91 cm AV Area (Vmean):   1.82 cm AV Area (VTI):     1.93 cm AV Vmax:           194.10 cm/s AV Vmean:          133.089 cm/s AV VTI:            0.431 m AV Peak Grad:      15.1 mmHg AV Mean Grad:      8.2 mmHg LVOT Vmax:         107.00 cm/s LVOT Vmean:        69.800 cm/s LVOT VTI:          0.240 m LVOT/AV VTI ratio: 0.56  AORTA Ao Root diam: 3.20 cm Ao Asc diam:  3.10 cm MITRAL VALVE                  TRICUSPID VALVE MV Area (PHT): 2.24 cm       TR Peak grad:   30.0 mmHg MV Mean grad:  3.0 mmHg       TR Vmax:        274.00 cm/s MV Decel Time: 338 msec MR Peak grad:    103.2 mmHg   SHUNTS MR Mean grad:    61.0 mmHg    Systemic VTI:  0.24 m MR Vmax:         508.00 cm/s  Systemic Diam: 2.10 cm MR Vmean:        364.0 cm/s MR PISA:         1.01 cm MR PISA Eff ROA: 6 mm MR PISA Radius:  0.40 cm MV E velocity: 72.60 cm/s MV A velocity:  138.00 cm/s MV E/A ratio:  0.53 Dorn Ross MD Electronically signed by Dorn Ross MD Signature Date/Time: 10/22/2023/1:04:32 PM    Final    CT Angio Chest PE W/Cm &/Or Wo Cm Result Date: 10/21/2023 CLINICAL DATA:  Pulmonary embolus suspected with high probability. Shortness of breath starting 3 hours ago. Worse with exertion. EXAM: CT ANGIOGRAPHY CHEST WITH CONTRAST TECHNIQUE: Multidetector CT imaging of the chest was performed using the standard protocol during bolus administration of intravenous contrast. Multiplanar CT image reconstructions and MIPs were obtained to evaluate the vascular anatomy. RADIATION DOSE REDUCTION: This exam was performed according to the departmental dose-optimization program which includes automated exposure control, adjustment of the mA and/or kV according to patient size and/or use of iterative reconstruction technique. CONTRAST:  75mL OMNIPAQUE  IOHEXOL  350 MG/ML SOLN COMPARISON:  Chest radiograph 10/21/2023.  CT chest 04/07/2021 FINDINGS: Cardiovascular: Pending the adequate study with good opacification of the central and segmental pulmonary arteries. Moderate motion artifact. No focal filling defects are demonstrated. No evidence of significant pulmonary embolus. Cardiac enlargement. No pericardial effusions. Normal caliber thoracic aorta. No aortic dissection. Calcification of the aorta and coronary arteries. 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 Mediastinum/Nodes: Esophagus is decompressed. No significant lymphadenopathy. Left thyroid  gland nodule measuring 2.6 cm diameter. This is enlarging since prior study. Elective follow-up ultrasound recommended. Lungs/Pleura: Motion artifact. Interstitial changes in the lungs with interlobular septal thickening, progressing since prior study. This is most likely represent edema. No airspace disease or consolidation. No pleural effusion or pneumothorax.  Upper Abdomen:  Punctate sized stones in the kidneys. No hydronephrosis. Surgical absence of the gallbladder. No acute abnormalities. Musculoskeletal: Degenerative changes in the spine. No acute bony abnormalities. Review of the MIP images confirms the above findings. IMPRESSION: 1. No evidence of significant pulmonary embolus. 2. Diffuse interstitial pattern to the lungs progressing since prior study, likely edema. 3. Cardiac enlargement. 4. Aortic atherosclerosis. 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 No change. 5. 2.6 cm left thyroid  nodule appears to be increasing since prior study. Elective follow-up ultrasound is recommended. 6. Nonobstructing intrarenal stones are incidentally noted. Electronically Signed   By: Elsie Gravely M.D.   On: 10/21/2023 21:08   DG Chest 2 View Result Date: 10/21/2023 CLINICAL DATA:  Dyspnea. EXAM: CHEST - 2 VIEW COMPARISON:  Chest x-ray 03/11/2023. CT abdomen and pelvis 06/05/2015. FINDINGS: The heart is enlarged, unchanged. The lungs are clear. There is no pleural effusion or pneumothorax. No acute fractures are seen. Cholecystectomy clips are present. Right renal calcifications are again seen measuring up to 17 mm. IMPRESSION: 1. No active cardiopulmonary disease. 2. Cardiomegaly. 3. Stable right renal calcifications measuring up to 17 mm. Electronically Signed   By: Greig Pique M.D.   On: 10/21/2023 19:17

## 2023-11-16 ENCOUNTER — Inpatient Hospital Stay: Payer: Medicare HMO | Admitting: Oncology

## 2023-11-16 ENCOUNTER — Ambulatory Visit: Payer: Medicare HMO | Admitting: Hematology

## 2023-11-16 VITALS — BP 101/66 | HR 58 | Temp 97.4°F | Resp 16 | Wt 143.8 lb

## 2023-11-16 DIAGNOSIS — N951 Menopausal and female climacteric states: Secondary | ICD-10-CM | POA: Diagnosis not present

## 2023-11-16 DIAGNOSIS — C50911 Malignant neoplasm of unspecified site of right female breast: Secondary | ICD-10-CM

## 2023-11-16 DIAGNOSIS — Z87891 Personal history of nicotine dependence: Secondary | ICD-10-CM | POA: Diagnosis not present

## 2023-11-16 DIAGNOSIS — C9110 Chronic lymphocytic leukemia of B-cell type not having achieved remission: Secondary | ICD-10-CM | POA: Diagnosis not present

## 2023-11-16 DIAGNOSIS — D649 Anemia, unspecified: Secondary | ICD-10-CM | POA: Diagnosis not present

## 2023-11-16 DIAGNOSIS — Z9011 Acquired absence of right breast and nipple: Secondary | ICD-10-CM | POA: Diagnosis not present

## 2023-11-16 DIAGNOSIS — E559 Vitamin D deficiency, unspecified: Secondary | ICD-10-CM | POA: Diagnosis not present

## 2023-11-16 DIAGNOSIS — Z853 Personal history of malignant neoplasm of breast: Secondary | ICD-10-CM | POA: Diagnosis not present

## 2023-11-16 DIAGNOSIS — Z9221 Personal history of antineoplastic chemotherapy: Secondary | ICD-10-CM | POA: Diagnosis not present

## 2023-11-16 DIAGNOSIS — Z923 Personal history of irradiation: Secondary | ICD-10-CM | POA: Diagnosis not present

## 2023-11-16 NOTE — Patient Instructions (Signed)
 CBC    Component Value Date/Time   WBC 21.5 (H) 11/09/2023 1412   RBC 3.02 (L) 11/09/2023 1412   HGB 9.4 (L) 11/09/2023 1412   HGB 13.5 10/12/2012 1038   HCT 30.6 (L) 11/09/2023 1412   HCT 42.7 10/12/2012 1038   PLT 213 11/09/2023 1412   PLT 241 10/12/2012 1038   MCV 101.3 (H) 11/09/2023 1412   MCV 88.9 10/12/2012 1038   MCH 31.1 11/09/2023 1412   MCHC 30.7 11/09/2023 1412   RDW 14.8 11/09/2023 1412   RDW 14.4 10/12/2012 1038   LYMPHSABS 15.3 (H) 11/09/2023 1412   LYMPHSABS 57.9 (H) 10/12/2012 1038   MONOABS 1.1 (H) 11/09/2023 1412   MONOABS 1.2 (H) 10/12/2012 1038   EOSABS 0.6 (H) 11/09/2023 1412   EOSABS 0.4 10/12/2012 1038   BASOSABS 0.0 11/09/2023 1412   BASOSABS 0.3 (H) 10/12/2012 1038   Anemia Reduce iron tabs to 1 /day.   Drink fluids.   Eat more protein.   We will check labs in 3 months to check your iron, B12 and folate levels.   We will have an office visit in 6 months.   Delon Hope, NP 11/16/2023 9:12 AM

## 2023-11-20 ENCOUNTER — Ambulatory Visit: Attending: Internal Medicine | Admitting: Audiologist

## 2023-11-20 DIAGNOSIS — H6121 Impacted cerumen, right ear: Secondary | ICD-10-CM | POA: Insufficient documentation

## 2023-11-20 NOTE — Procedures (Signed)
 Impacted cerumen right ear. Needs ENT not audiology. Can have hearing test with ENT office once cerumen removed.

## 2023-12-06 NOTE — Progress Notes (Unsigned)
 Cardiology Office Note    Date:  12/07/2023   ID:  [name], DOB August 22, 1935, MRN [id number]  PCP:  Bevely Doffing, FNP  Cardiologist: Vina Gull, Wallace    Patient presents for follow up of PAF and HFrEF   History of Present Illness:    [name] is a 88 y.o. female with past medical history of paroxysmal atrial fibrillation, HFrEF/NICM (EF previously 55-60% in 06/2018, reduced to 40-45% by repeat echo in 03/2019 and at 35-40% by echo in 05/2020), CAD (cath in 06/2020 showing mild to moderate nonobstructive CAD), aortic stenosis, LBBB, CLL and COPD.  The pt has been on Bisoprolol , spironolactone , losartan .  She did not tolerate Entresto or Jardiance      I saw the pt in clinic in Sept 2024  She was seen by [name] in Jan 2025  The pt was admitted to [facility] in May 2025 for CHF exacerbation  SOB had been developing the week prior   ALs had LLE cellulitis with tick bite   She was diuresed    Echo done showed LVEF 50 to 55%   Recomm lasix  as needed   Since seen she say her breathing is good   No LE edema    (Gives 2 thumbs up on how feeling )      Past Medical History:  Diagnosis Date   Bowel obstruction (HCC)    Breast cancer (HCC)    Breast cancer, stage 3 (HCC) 07/21/2011   Bronchitis    CAD (coronary artery disease)    a. 06/2020 Cath: LM nl, LAD 25m, LCX min irregs, RCA mild diff dzs. PA 34/15(21), PCWP 15. CO 5.0 L/min. EF 25-35%, glob HK.   Chronic heart failure with preserved ejection fraction (HFpEF) (HCC)    a. 06/2018 Echo: EF 55-60%; Veronica. 03/2019 Echo: EF 40-45%; c. 05/2020 Echo: EF 35-40%; d. 12/2020 Echo: EF 35-40%; e. 03/2021 Echo: EF 35-40%. mod LVH,GrI DD, nl RV fxn, mod dil LA/RA, mild MR/AS.   CLL (chronic lymphocytic leukemia) (HCC) 07/21/2011   Migraines    NICM (nonischemic cardiomyopathy) (HCC)    PAF (paroxysmal atrial fibrillation) (HCC)    a. Dx 06/2018 in setting of COPD flare-->bb/eliquis  (CHA2DS2VASc = 6); Veronica. 02/2020 Zio: Predominantly sinus rhythm  at an average heart rate of 60 (39-190).  3% atrial fibrillation burden and average of 127 (59-1 and 37), longest lasting 9 hours and 23 minutes.  43 runs of PSVT noted.  3 runs of nonsustained VT noted.   Pneumonia    PSVT (paroxysmal supraventricular tachycardia) (HCC)    a. 02/2020 Zio: 43 rounds of symptomatic PSVT.  Longest 19 beats; fastest 164 bpm x 14 beats.    Past Surgical History:  Procedure Laterality Date   ABDOMINAL HYSTERECTOMY     APPENDECTOMY     CATARACT EXTRACTION Bilateral 2023   CHOLECYSTECTOMY     LAPAROTOMY N/A 06/08/2015   Procedure: EXPLORATORY LAPAROTOMY;  Surgeon: [name];  Location: [facility];  Service: General;  Laterality: N/A;   LYSIS OF ADHESION N/A 06/08/2015   Procedure: LYSIS OF ADHESION;  Surgeon: [name];  Location: [facility];  Service: General;  Laterality: N/A;   MASTECTOMY     right   RIGHT/LEFT HEART CATH AND CORONARY ANGIOGRAPHY N/A 07/09/2020   Procedure: RIGHT/LEFT HEART CATH AND CORONARY ANGIOGRAPHY;  Surgeon: [name];  Location: [facility];  Service: Cardiovascular;  Laterality: N/A;   TOTAL HIP ARTHROPLASTY     right  Current Medications: Outpatient Medications Prior to Visit  Medication Sig Dispense Refill   acetaminophen  (TYLENOL ) 500 MG tablet Take 1,000 mg by mouth every 6 (six) hours as needed for moderate pain or headache.     atorvastatin  (LIPITOR) 20 MG tablet TAKE ONE TABLET BY MOUTH ONCE DAILY. 90 tablet 3   bisoprolol  (ZEBETA ) 5 MG tablet TAKE (1/2) TABLET BY MOUTH ONCE DAILY. (Patient taking differently: Take 2.5 mg by mouth daily.) 45 tablet 2   BLACK COHOSH PO Take 1 capsule by mouth daily.     cholecalciferol  (VITAMIN D3) 25 MCG (1000 UNIT) tablet Take 1,000-2,000 Units by mouth daily. Alternating between 1-2 tabs every other day (Monday - 2 tabs, Tuesday - 1 tab, Wednesday - 2 tabs, Thursday - 1 tab, Friday - 2 tabs) On Saturday and Sunday it's 1 tab daily     diphenhydrAMINE  (BENADRYL )  25 MG tablet Take 25 mg by mouth daily as needed for allergies.     ELIQUIS  5 MG TABS tablet TAKE 1 TABLET BY MOUTH TWICE A DAY 180 tablet 1   ferrous sulfate  325 (65 FE) MG EC tablet Take 325 mg by mouth 3 (three) times daily with meals. (Patient taking differently: Take 325 mg by mouth daily.)     furosemide  (LASIX ) 20 MG tablet Take 1 tablet (20 mg total) by mouth daily as needed for fluid or edema (shortness of breath). 30 tablet 2   losartan  (COZAAR ) 25 MG tablet TAKE (1/2) TABLET BY MOUTH DAILY (Patient taking differently: Take 12.5 mg by mouth daily. TAKE (1/2) TABLET BY MOUTH DAILY) 45 tablet 3   Misc. 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 Dx: C50.911 1 each 0   omeprazole  (PRILOSEC) 20 MG capsule Take 1 capsule (20 mg total) by mouth daily. 90 capsule 0   ondansetron  (ZOFRAN ) 4 MG tablet Take 1 tablet (4 mg total) by mouth every 8 (eight) hours as needed for nausea or vomiting. (Patient taking differently: Take 4 mg by mouth 3 (three) times daily as needed for nausea or vomiting.) 20 tablet 0   spironolactone  (ALDACTONE ) 25 MG tablet Take 0.5 tablets (12.5 mg total) by mouth daily. 45 tablet 3   VEOZAH  45 MG TABS Take 1 tablet (45 mg total) by mouth daily. 30 tablet 0   albuterol  (PROVENTIL  HFA;VENTOLIN  HFA) 108 (90 BASE) MCG/ACT inhaler Inhale 2 puffs into the lungs every 6 (six) hours as needed for shortness of breath. (Patient not taking: Reported on 12/07/2023)     No facility-administered medications prior to visit.     Allergies:   Albuterol , Codeine, Entresto [sacubitril-valsartan], Jardiance  [empagliflozin ], Meclizine, Sulfa antibiotics, and Zoloft [sertraline hcl]   Social History   Socioeconomic History   Marital status: Widowed    Spouse name: Not on file   Number of children: Not on file   Years of education: 11   Highest education level: Not on file  Occupational History   Not on file  Tobacco Use   Smoking status: Former     Current packs/day: 0.00    Types: Cigarettes    Quit date: 10/26/1978    Years since quitting: 45.1   Smokeless tobacco: Never  Vaping Use   Vaping status: Never Used  Substance and Sexual Activity   Alcohol use: No   Drug use: No   Sexual activity: Not Currently  Other Topics Concern   Not on file  Social History Narrative   Pt lives by herself   Social  Drivers of Corporate investment banker Strain: Low Risk  (02/21/2023)   Overall Financial Resource Strain (CARDIA)    Difficulty of Paying Living Expenses: Not hard at all  Food Insecurity: No Food Insecurity (10/24/2023)   Hunger Vital Sign    Worried About Running Out of Food in the Last Year: Never true    Ran Out of Food in the Last Year: Never true  Transportation Needs: No Transportation Needs (10/24/2023)   PRAPARE - Administrator, Civil Service (Medical): No    Lack of Transportation (Non-Medical): No  Physical Activity: Insufficiently Active (02/21/2023)   Exercise Vital Sign    Days of Exercise per Week: 2 days    Minutes of Exercise per Session: 20 min  Stress: No Stress Concern Present (02/21/2023)   [employer] of Occupational Health - Occupational Stress Questionnaire    Feeling of Stress : Not at all  Social Connections: Moderately Isolated (10/21/2023)   Social Connection and Isolation Panel    Frequency of Communication with Friends and Family: More than three times a week    Frequency of Social Gatherings with Friends and Family: More than three times a week    Attends Religious Services: More than 4 times per year    Active Member of [employer] or Organizations: No    Attends Banker Meetings: Never    Marital Status: Widowed     Family History:  The patient's family history includes Cancer in her brother and another family member.   Review of Systems:    Please see the history of present illness.     All other systems reviewed and are otherwise negative except as noted  above.   Physical Exam:    VS:  BP 102/64 (BP Location: Left Arm, Patient Position: Sitting)   Pulse (!) 56   Ht 5' 1 (1.549 m)   Wt 146 lb (66.2 kg)   SpO2 98%   BMI 27.59 kg/m    General: 88 year old woman in no acute distress.  Examined in chair Neck: No carotid bruits. JVP is not elevated  Lungs: Clear to auscultation  heart: Regular rate and rhythm.  No significant murmurs  Abdomen: Obese   Nontender   No hepatomegaly  Extremities:  No LE edema. . Wt Readings from Last 3 Encounters:  12/07/23 146 lb (66.2 kg)  11/16/23 143 lb 12.8 oz (65.2 kg)  11/03/23 142 lb 1.3 oz (64.4 kg)     Studies/Labs Reviewed:   EKG:  EKG is not ordered today.    Recent Labs: 10/21/2023: Veronica Natriuretic Peptide 755.0 10/23/2023: Magnesium  1.9 11/09/2023: ALT 12; BUN 28; Creatinine, Ser 1.33; Hemoglobin 9.4; Platelets 213; Potassium 4.3; Sodium 138   Lipid Panel    Component Value Date/Time   CHOL 124 02/07/2023 1432   TRIG 73 02/07/2023 1432   HDL 61 02/07/2023 1432   CHOLHDL 2.0 02/07/2023 1432   VLDL 15 02/07/2023 1432   LDLCALC 48 02/07/2023 1432    Additional studies/ records that were reviewed today include:     Echo  May 2025   1. Left ventricular ejection fraction, by estimation, is 50 to 55%. The  left ventricle has low normal function. Left ventricular endocardial  border not optimally defined to evaluate regional wall motion. Left  ventricular diastolic parameters are  consistent with Grade I diastolic dysfunction (impaired relaxation). The  average left ventricular global longitudinal strain is -20.6 %. The global  longitudinal strain is normal.   2.  Right ventricular systolic function is normal. The right ventricular  size is normal.   3. Left atrial size was severely dilated.   4. The mitral valve is abnormal. Mild mitral valve regurgitation. Mild  mitral stenosis. The mean mitral valve gradient is 3.0 mmHg. Moderate  mitral annular calcification.   5. The aortic  valve is tricuspid. There is mild calcification of the  aortic valve. There is mild thickening of the aortic valve. Aortic valve  regurgitation is not visualized. No aortic stenosis is present.   6. The inferior vena cava is normal in size with greater than 50%  respiratory variability, suggesting right atrial pressure of 3 mmHg.    Cardiac Catheterization: 07/09/2020 Conclusions: Mild to moderate, non-obstructive coronary artery disease with 30-40% mid LAD stenosis and mild luminal irregularities involving the LCx and RCA. Severely reduced left ventricular systolic function (LVEF 25-35%). Upper normal left heart, right heart, and pulmonary artery pressures. Normal Fick cardiac output/index. Mild aortic stenosis.   Recommendations: Medical therapy and risk factor modification to prevent progression of coronary artery disease. Escalate goal directed medical therapy for treatment of nonischemic cardiomyopathy, as tolerated. Restart apixaban  tomorrow morning if no evidence of bleeding/vascular complication.    Echocardiogram: 04/08/21    1. Left ventricular ejection fraction, by estimation, is 35 to 40%. The  left ventricle has moderately decreased function. The left ventricle  demonstrates regional wall motion abnormalities (see scoring  diagram/findings for description). There is  moderate left ventricular hypertrophy. Left ventricular diastolic  parameters are consistent with Grade I diastolic dysfunction (impaired  relaxation). Elevated left atrial pressure.   2. Right ventricular systolic function is normal. The right ventricular  size is normal. There is mildly elevated pulmonary artery systolic  pressure.   3. Left atrial size was moderately dilated.   4. Right atrial size was mild to moderately dilated.   5. The mitral valve is abnormal. Mild mitral valve regurgitation. Mild  mitral stenosis. Moderate mitral annular calcification.   6. The aortic valve is tricuspid. There  is moderate calcification of the  aortic valve. There is moderate thickening of the aortic valve. Aortic  valve regurgitation is not visualized. Mild aortic valve stenosis.   FINDINGS   Left Ventricle: The anteroseptum, anterior    1. Left ventricular ejection fraction, by estimation, is 35 to 40%. The  left ventricle has moderately decreased function. The left ventricle  demonstrates regional wall motion abnormalities (see scoring  diagram/findings for description). The left  ventricular internal cavity size was mildly dilated. Left ventricular  diastolic parameters are consistent with Grade I diastolic dysfunction  (impaired relaxation).   2. Right ventricular systolic function is normal. The right ventricular  size is normal. Tricuspid regurgitation signal is inadequate for assessing  PA pressure.   3. Mild mitral valve regurgitation.   4. The aortic valve is tricuspid. There is moderate calcification of the  aortic valve. Aortic valve regurgitation is not visualized. Mild aortic  valve stenosis. Aortic valve mean gradient measures 10.5 mmHg.  Dimentionless index 0.46.   5. The inferior vena cava is normal in size with greater than 50%  respiratory variability, suggesting right atrial pressure of 3 mmHg.   Plan:    1. HFrEF/NICM  Recent admission this spring for CHF exacerbation   ? If triggered by cellulitis/inflammation   Echo shows LVEF low normal  Keep on current regimen   She is tolerating it well   2. Paroxysmal Atrial Fibrillation Denies palpitaitons    Keep on current  regimen  3. CAD - She had mild to moderate nonobstructive CAD by catheterization in 06/2020.  No symptoms of angina   Follow   4. Aortic Stenosis -Echo in May 2025 AS remains mild    5. LIpids   LDL 48  HDL 61 in Sept 2024   Will check on next visit   6  CLL   Follows in oncology      Follow up in March 2026    Signed, Vina Gull, Wallace  12/07/2023 12:13 PM    [facility] [address] [place], [place] [address] Phone: [phone] Fax: [phone]                                                      .[email or web address].....................................................................................................................................................................................................................................................................................................................................................................................................................................................   Cardiology Office Note    Date:  12/07/2023   ID:  ADAMAE RICKLEFS, DOB [birth year], MRN [id number]  PCP:  Bevely Doffing, FNP  Cardiologist: Vina Gull, Wallace    Patient presents for follow up of PAF and HFrEF   History of Present Illness:    [name] is a 88 y.o. female with past medical history of paroxysmal atrial fibrillation, HFrEF/NICM (EF previously 55-60% in 06/2018, reduced to 40-45% by repeat echo in 03/2019 and at 35-40% by echo in 05/2020), CAD (cath in 06/2020 showing mild to moderate nonobstructive CAD), aortic stenosis, LBBB, CLL and COPD.  The pt has been on Bisoprolol , spironolactone , losartan .  She did not tolerate Entresto or Jardiance     She was seen in clinic on 05/13/21  Nauseated  Vomited  Sent tp ED and then sent home  Pt says symptoms resolved after a couple days   No real testing done   Rx for N/V given     Since then she has done good  Today she feels great   Breathing good   No CP   NO swelling    NO PND   Sleeping OK    Has not had any palpitations   I saw the pt in clinic in Jan 2023      Past Medical History:  Diagnosis Date   Bowel obstruction (HCC)    Breast cancer (HCC)    Breast cancer, stage 3 (HCC) 07/21/2011   Bronchitis    CAD (coronary artery disease)    a. 06/2020 Cath: LM nl, LAD 86m, LCX min irregs, RCA  mild diff dzs. PA 34/15(21), PCWP 15. CO 5.0 L/min. EF 25-35%, glob HK.   Chronic heart failure with preserved ejection fraction (HFpEF) (HCC)    a. 06/2018 Echo: EF 55-60%; Veronica. 03/2019 Echo: EF 40-45%; c. 05/2020 Echo: EF 35-40%; d. 12/2020 Echo: EF 35-40%; e. 03/2021 Echo: EF 35-40%. mod LVH,GrI DD, nl RV fxn, mod dil LA/RA, mild MR/AS.   CLL (chronic lymphocytic leukemia) (HCC) 07/21/2011   Migraines    NICM (nonischemic cardiomyopathy) (HCC)    PAF (paroxysmal atrial fibrillation) (HCC)    a. Dx 06/2018 in setting of COPD flare-->bb/eliquis  (CHA2DS2VASc = 6); Veronica. 02/2020 Zio: Predominantly sinus rhythm at an average heart rate of 60 (39-190).  3% atrial fibrillation burden and average of 127 (59-1 and 37), longest lasting 9 hours and 23 minutes.  43 runs of PSVT noted.  3 runs of nonsustained VT noted.   Pneumonia    PSVT (  paroxysmal supraventricular tachycardia) (HCC)    a. 02/2020 Zio: 43 rounds of symptomatic PSVT.  Longest 19 beats; fastest 164 bpm x 14 beats.    Past Surgical History:  Procedure Laterality Date   ABDOMINAL HYSTERECTOMY     APPENDECTOMY     CATARACT EXTRACTION Bilateral 2023   CHOLECYSTECTOMY     LAPAROTOMY N/A 06/08/2015   Procedure: EXPLORATORY LAPAROTOMY;  Surgeon: [name];  Location: [facility];  Service: General;  Laterality: N/A;   LYSIS OF ADHESION N/A 06/08/2015   Procedure: LYSIS OF ADHESION;  Surgeon: [name];  Location: [facility];  Service: General;  Laterality: N/A;   MASTECTOMY     right   RIGHT/LEFT HEART CATH AND CORONARY ANGIOGRAPHY N/A 07/09/2020   Procedure: RIGHT/LEFT HEART CATH AND CORONARY ANGIOGRAPHY;  Surgeon: [name];  Location: [facility];  Service: Cardiovascular;  Laterality: N/A;   TOTAL HIP ARTHROPLASTY     right    Current Medications: Outpatient Medications Prior to Visit  Medication Sig Dispense Refill   acetaminophen  (TYLENOL ) 500 MG tablet Take 1,000 mg by mouth every 6 (six) hours as needed for  moderate pain or headache.     atorvastatin  (LIPITOR) 20 MG tablet TAKE ONE TABLET BY MOUTH ONCE DAILY. 90 tablet 3   bisoprolol  (ZEBETA ) 5 MG tablet TAKE (1/2) TABLET BY MOUTH ONCE DAILY. (Patient taking differently: Take 2.5 mg by mouth daily.) 45 tablet 2   BLACK COHOSH PO Take 1 capsule by mouth daily.     cholecalciferol  (VITAMIN D3) 25 MCG (1000 UNIT) tablet Take 1,000-2,000 Units by mouth daily. Alternating between 1-2 tabs every other day (Monday - 2 tabs, Tuesday - 1 tab, Wednesday - 2 tabs, Thursday - 1 tab, Friday - 2 tabs) On Saturday and Sunday it's 1 tab daily     diphenhydrAMINE  (BENADRYL ) 25 MG tablet Take 25 mg by mouth daily as needed for allergies.     ELIQUIS  5 MG TABS tablet TAKE 1 TABLET BY MOUTH TWICE A DAY 180 tablet 1   ferrous sulfate  325 (65 FE) MG EC tablet Take 325 mg by mouth 3 (three) times daily with meals. (Patient taking differently: Take 325 mg by mouth daily.)     furosemide  (LASIX ) 20 MG tablet Take 1 tablet (20 mg total) by mouth daily as needed for fluid or edema (shortness of breath). 30 tablet 2   losartan  (COZAAR ) 25 MG tablet TAKE (1/2) TABLET BY MOUTH DAILY (Patient taking differently: Take 12.5 mg by mouth daily. TAKE (1/2) TABLET BY MOUTH DAILY) 45 tablet 3   Misc. 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 Dx: C50.911 1 each 0   omeprazole  (PRILOSEC) 20 MG capsule Take 1 capsule (20 mg total) by mouth daily. 90 capsule 0   ondansetron  (ZOFRAN ) 4 MG tablet Take 1 tablet (4 mg total) by mouth every 8 (eight) hours as needed for nausea or vomiting. (Patient taking differently: Take 4 mg by mouth 3 (three) times daily as needed for nausea or vomiting.) 20 tablet 0   spironolactone  (ALDACTONE ) 25 MG tablet Take 0.5 tablets (12.5 mg total) by mouth daily. 45 tablet 3   VEOZAH  45 MG TABS Take 1 tablet (45 mg total) by mouth daily. 30 tablet 0   albuterol  (PROVENTIL  HFA;VENTOLIN  HFA) 108 (90 BASE) MCG/ACT inhaler Inhale 2  puffs into the lungs every 6 (six) hours as needed for shortness of breath. (Patient not taking: Reported on 12/07/2023)  No facility-administered medications prior to visit.     Allergies:   Albuterol , Codeine, Entresto [sacubitril-valsartan], Jardiance  [empagliflozin ], Meclizine, Sulfa antibiotics, and Zoloft [sertraline hcl]   Social History   Socioeconomic History   Marital status: Widowed    Spouse name: Not on file   Number of children: Not on file   Years of education: 11   Highest education level: Not on file  Occupational History   Not on file  Tobacco Use   Smoking status: Former    Current packs/day: 0.00    Types: Cigarettes    Quit date: 10/26/1978    Years since quitting: 45.1   Smokeless tobacco: Never  Vaping Use   Vaping status: Never Used  Substance and Sexual Activity   Alcohol use: No   Drug use: No   Sexual activity: Not Currently  Other Topics Concern   Not on file  Social History Narrative   Pt lives by herself   Social Drivers of Health   Financial Resource Strain: Low Risk  (02/21/2023)   Overall Financial Resource Strain (CARDIA)    Difficulty of Paying Living Expenses: Not hard at all  Food Insecurity: No Food Insecurity (10/24/2023)   Hunger Vital Sign    Worried About Running Out of Food in the Last Year: Never true    Ran Out of Food in the Last Year: Never true  Transportation Needs: No Transportation Needs (10/24/2023)   PRAPARE - Administrator, Civil Service (Medical): No    Lack of Transportation (Non-Medical): No  Physical Activity: Insufficiently Active (02/21/2023)   Exercise Vital Sign    Days of Exercise per Week: 2 days    Minutes of Exercise per Session: 20 min  Stress: No Stress Concern Present (02/21/2023)   [employer] of Occupational Health - Occupational Stress Questionnaire    Feeling of Stress : Not at all  Social Connections: Moderately Isolated (10/21/2023)   Social Connection and Isolation Panel     Frequency of Communication with Friends and Family: More than three times a week    Frequency of Social Gatherings with Friends and Family: More than three times a week    Attends Religious Services: More than 4 times per year    Active Member of [employer] or Organizations: No    Attends Banker Meetings: Never    Marital Status: Widowed     Family History:  The patient's family history includes Cancer in her brother and another family member.   Review of Systems:    Please see the history of present illness.     All other systems reviewed and are otherwise negative except as noted above.   Physical Exam:    VS:  BP 102/64 (BP Location: Left Arm, Patient Position: Sitting)   Pulse (!) 56   Ht 5' 1 (1.549 m)   Wt 146 lb (66.2 kg)   SpO2 98%   BMI 27.59 kg/m    General: Obese 88 year old woman in no acute distress. Head: Normocephalic, atraumatic. Neck: No carotid bruits. JVD not elevated.  Lungs: Clear to auscultation  heart: Regular rate and rhythm.  2/6 SEM along RUSB.  Abdomen: Obese   Nontender  Msk: Moving all extremities  Extremities:  No pitting edema. . Neuro: Alert and oriented X 3. Moves all extremities spontaneously. No focal deficits noted. Psych:  Responds to questions appropriately with a normal affect. Skin: No rashes or lesions noted  Wt Readings from Last 3 Encounters:  12/07/23 146  lb (66.2 kg)  11/16/23 143 lb 12.8 oz (65.2 kg)  11/03/23 142 lb 1.3 oz (64.4 kg)     Studies/Labs Reviewed:   EKG:  EKG is not ordered today.   Recent Labs: 10/21/2023: Veronica Natriuretic Peptide 755.0 10/23/2023: Magnesium  1.9 11/09/2023: ALT 12; BUN 28; Creatinine, Ser 1.33; Hemoglobin 9.4; Platelets 213; Potassium 4.3; Sodium 138   Lipid Panel    Component Value Date/Time   CHOL 124 02/07/2023 1432   TRIG 73 02/07/2023 1432   HDL 61 02/07/2023 1432   CHOLHDL 2.0 02/07/2023 1432   VLDL 15 02/07/2023 1432   LDLCALC 48 02/07/2023 1432    Additional studies/  records that were reviewed today include:   Cardiac Catheterization: 07/09/2020 Conclusions: Mild to moderate, non-obstructive coronary artery disease with 30-40% mid LAD stenosis and mild luminal irregularities involving the LCx and RCA. Severely reduced left ventricular systolic function (LVEF 25-35%). Upper normal left heart, right heart, and pulmonary artery pressures. Normal Fick cardiac output/index. Mild aortic stenosis.   Recommendations: Medical therapy and risk factor modification to prevent progression of coronary artery disease. Escalate goal directed medical therapy for treatment of nonischemic cardiomyopathy, as tolerated. Restart apixaban  tomorrow morning if no evidence of bleeding/vascular complication.    Echocardiogram: 12/21/2020 IMPRESSIONS     1. Left ventricular ejection fraction, by estimation, is 35 to 40%. The  left ventricle has moderately decreased function. The left ventricle  demonstrates regional wall motion abnormalities (see scoring  diagram/findings for description). The left  ventricular internal cavity size was mildly dilated. Left ventricular  diastolic parameters are consistent with Grade I diastolic dysfunction  (impaired relaxation).   2. Right ventricular systolic function is normal. The right ventricular  size is normal. Tricuspid regurgitation signal is inadequate for assessing  PA pressure.   3. Mild mitral valve regurgitation.   4. The aortic valve is tricuspid. There is moderate calcification of the  aortic valve. Aortic valve regurgitation is not visualized. Mild aortic  valve stenosis. Aortic valve mean gradient measures 10.5 mmHg.  Dimentionless index 0.46.   5. The inferior vena cava is normal in size with greater than 50%  respiratory variability, suggesting right atrial pressure of 3 mmHg.   Echo:  04/08/21 eft ventricular ejection fraction, by estimation, is 35 to 40%. The left ventricle has moderately decreased function. The  left ventricle demonstrates regional wall motion abnormalities (see scoring diagram/findings for description). There is moderate left ventricular hypertrophy. Left ventricular diastolic parameters are consistent with Grade I diastolic dysfunction (impaired relaxation). Elevated left atrial pressure. 1. Right ventricular systolic function is normal. The right ventricular size is normal. There is mildly elevated pulmonary artery systolic pressure. 2. 3. Left atrial size was moderately dilated. 4. Right atrial size was mild to moderately dilated. The mitral valve is abnormal. Mild mitral valve regurgitation. Mild mitral stenosis. Moderate mitral annular calcification. 5. The aortic valve is tricuspid. There is moderate calcification of the aortic valve. There is moderate thickening of the aortic valve. Aortic valve regurgitation is not visualized. Mild aortic valve stenosis.  Assessment:    No diagnosis found.    Plan:   In order of problems listed above:  1. HFrEF/NICM  - Her EF remains reduced at 35 to 40% by most recent echocardiogram  Doing well  Feels great I would keep on current regimen   She did not tolerate Jardiance  or Entresto in past     2. Paroxysmal Atrial Fibrillation - She denies any recent palpitations.   Continue Bisoprolol  5  mg daily for rate control. Keep on current dose of Eliquis . 3. CAD - She had mild to moderate nonobstructive CAD by recent catheterization in 06/2020. She denies any recent anginal symptoms. Continue Atorvastatin  20 mg daily and Bisoprolol  5 mg daily. She is not on ASA given the need for anticoagulation  4. Aortic Stenosis -Continue to follow with periodic echoes.  MIld on recent echo    5. LIpids   LDL excellent on recent labs   6  CLL - Followed by Oncology.   Follow-up at end of May 2023 Medication Adjustments/Labs and Tests Ordered: Current medicines are reviewed at length with the patient today.  Concerns regarding medicines are  outlined above.  Medication changes, Labs and Tests ordered today are listed in the Patient Instructions below. Patient Instructions  Medication Instructions:   Continue all current medications.   Labwork:  none  Testing/Procedures:  none  Follow-Up:  February 2026  Any Other Special Instructions Will Be Listed Below (If Applicable).   If you need a refill on your cardiac medications before your next appointment, please call your pharmacy.    Signed, Vina Gull, Wallace  12/07/2023 12:13 PM    [facility] [address] [place], [place] [address] Phone: [phone] Fax: [phone]

## 2023-12-07 ENCOUNTER — Ambulatory Visit: Attending: Internal Medicine | Admitting: Internal Medicine

## 2023-12-07 VITALS — BP 102/64 | HR 56 | Ht 61.0 in | Wt 146.0 lb

## 2023-12-07 DIAGNOSIS — I5030 Unspecified diastolic (congestive) heart failure: Secondary | ICD-10-CM

## 2023-12-07 NOTE — Patient Instructions (Addendum)
 Medication Instructions:   Continue all current medications.   Labwork:  none  Testing/Procedures:  none  Follow-Up:  February 2026  Any Other Special Instructions Will Be Listed Below (If Applicable).   If you need a refill on your cardiac medications before your next appointment, please call your pharmacy.

## 2023-12-14 DIAGNOSIS — Z008 Encounter for other general examination: Secondary | ICD-10-CM | POA: Diagnosis not present

## 2024-01-07 ENCOUNTER — Other Ambulatory Visit: Payer: Self-pay | Admitting: Internal Medicine

## 2024-01-07 DIAGNOSIS — I480 Paroxysmal atrial fibrillation: Secondary | ICD-10-CM

## 2024-01-08 NOTE — Telephone Encounter (Signed)
 Prescription refill request for Eliquis  received. Indication: PAF Last office visit:12/07/23  [name] Gull MD Scr: 1.33 on 11/09/23  Epic Age: 88 Weight: 65.2kg  Based on above findings Eliquis  5mg  twice daily is the appropriate dose.  Refill approved.

## 2024-01-09 ENCOUNTER — Ambulatory Visit

## 2024-01-09 ENCOUNTER — Encounter: Payer: Self-pay | Admitting: Nurse Practitioner

## 2024-01-09 ENCOUNTER — Ambulatory Visit (INDEPENDENT_AMBULATORY_CARE_PROVIDER_SITE_OTHER): Admitting: Nurse Practitioner

## 2024-01-09 VITALS — BP 110/60 | HR 70 | Ht 61.0 in | Wt 145.0 lb

## 2024-01-09 DIAGNOSIS — M129 Arthropathy, unspecified: Secondary | ICD-10-CM

## 2024-01-09 DIAGNOSIS — E8941 Symptomatic postprocedural ovarian failure: Secondary | ICD-10-CM | POA: Diagnosis not present

## 2024-01-09 DIAGNOSIS — R110 Nausea: Secondary | ICD-10-CM | POA: Diagnosis not present

## 2024-01-09 DIAGNOSIS — K219 Gastro-esophageal reflux disease without esophagitis: Secondary | ICD-10-CM

## 2024-01-09 MED ORDER — VEOZAH 45 MG PO TABS: 1.0000 | ORAL_TABLET | Freq: Every day | ORAL | 11 refills | Status: DC

## 2024-01-09 MED ORDER — ONDANSETRON HCL 4 MG PO TABS
4.0000 mg | ORAL_TABLET | Freq: Three times a day (TID) | ORAL | 0 refills | Status: DC | PRN
Start: 2024-01-09 — End: 2024-02-21

## 2024-01-09 NOTE — Patient Instructions (Signed)
 1) Menopause - refill veozah  2) Nausea - ondansetron  refill 3) Follow up with Leita in 3 months, check liver enzymes

## 2024-01-09 NOTE — Progress Notes (Signed)
 Established Patient Office Visit  Subjective:  Patient ID: [name], female    DOB: 1935/08/07  Age: 88 y.o. MRN: [id number]  Chief Complaint  Patient presents with   Care Management    Three month follow up   Nausea    Patient requesting zofran    Hot Flashes    Patient requesting medication     Patient here today for a 3 month follow up, she reports she is on furosemide  every other day.  Next labs are due Sept 25, 2025.  She reports cardiologist is going to check her vit B 12 at her follow up visit.  Patient states she feels well overall.      No other concerns at this time.   Past Medical History:  Diagnosis Date   Bowel obstruction (HCC)    Breast cancer (HCC)    Breast cancer, stage 3 (HCC) 07/21/2011   Bronchitis    CAD (coronary artery disease)    a. 06/2020 Cath: LM nl, LAD 75m, LCX min irregs, RCA mild diff dzs. PA 34/15(21), PCWP 15. CO 5.0 L/min. EF 25-35%, glob HK.   Chronic heart failure with preserved ejection fraction (HFpEF) (HCC)    a. 06/2018 Echo: EF 55-60%; b. 03/2019 Echo: EF 40-45%; c. 05/2020 Echo: EF 35-40%; d. 12/2020 Echo: EF 35-40%; e. 03/2021 Echo: EF 35-40%. mod LVH,GrI DD, nl RV fxn, mod dil LA/RA, mild MR/AS.   CLL (chronic lymphocytic leukemia) (HCC) 07/21/2011   Migraines    NICM (nonischemic cardiomyopathy) (HCC)    PAF (paroxysmal atrial fibrillation) (HCC)    a. Dx 06/2018 in setting of COPD flare-->bb/eliquis  (CHA2DS2VASc = 6); b. 02/2020 Zio: Predominantly sinus rhythm at an average heart rate of 60 (39-190).  3% atrial fibrillation burden and average of 127 (59-1 and 37), longest lasting 9 hours and 23 minutes.  43 runs of PSVT noted.  3 runs of nonsustained VT noted.   Pneumonia    PSVT (paroxysmal supraventricular tachycardia) (HCC)    a. 02/2020 Zio: 43 rounds of symptomatic PSVT.  Longest 19 beats; fastest 164 bpm x 14 beats.    Past Surgical History:  Procedure Laterality Date   ABDOMINAL HYSTERECTOMY     APPENDECTOMY      CATARACT EXTRACTION Bilateral 2023   CHOLECYSTECTOMY     LAPAROTOMY N/A 06/08/2015   Procedure: EXPLORATORY LAPAROTOMY;  Surgeon: Oneil Budge, MD;  Location: [facility];  Service: General;  Laterality: N/A;   LYSIS OF ADHESION N/A 06/08/2015   Procedure: LYSIS OF ADHESION;  Surgeon: Oneil Budge, MD;  Location: [facility];  Service: General;  Laterality: N/A;   MASTECTOMY     right   RIGHT/LEFT HEART CATH AND CORONARY ANGIOGRAPHY N/A 07/09/2020   Procedure: RIGHT/LEFT HEART CATH AND CORONARY ANGIOGRAPHY;  Surgeon: Mady Bruckner, MD;  Location: [facility];  Service: Cardiovascular;  Laterality: N/A;   TOTAL HIP ARTHROPLASTY     right    Social History   Socioeconomic History   Marital status: Widowed    Spouse name: Not on file   Number of children: Not on file   Years of education: 11   Highest education level: Not on file  Occupational History   Not on file  Tobacco Use   Smoking status: Former    Current packs/day: 0.00    Types: Cigarettes    Quit date: 10/26/1978    Years since quitting: 45.2   Smokeless tobacco: Never  Vaping Use   Vaping status: Never Used  Substance and Sexual Activity   Alcohol use: No   Drug use: No   Sexual activity: Not Currently  Other Topics Concern   Not on file  Social History Narrative   Pt lives by herself   Social Drivers of Health   Financial Resource Strain: Low Risk  (02/21/2023)   Overall Financial Resource Strain (CARDIA)    Difficulty of Paying Living Expenses: Not hard at all  Food Insecurity: No Food Insecurity (10/24/2023)   Hunger Vital Sign    Worried About Running Out of Food in the Last Year: Never true    Ran Out of Food in the Last Year: Never true  Transportation Needs: No Transportation Needs (10/24/2023)   PRAPARE - Administrator, Civil Service (Medical): No    Lack of Transportation (Non-Medical): No  Physical Activity: Insufficiently Active (02/21/2023)   Exercise Vital Sign    Days of Exercise per  Week: 2 days    Minutes of Exercise per Session: 20 min  Stress: No Stress Concern Present (02/21/2023)   [employer] of Occupational Health - Occupational Stress Questionnaire    Feeling of Stress : Not at all  Social Connections: Moderately Isolated (10/21/2023)   Social Connection and Isolation Panel    Frequency of Communication with Friends and Family: More than three times a week    Frequency of Social Gatherings with Friends and Family: More than three times a week    Attends Religious Services: More than 4 times per year    Active Member of [employer] or Organizations: No    Attends Banker Meetings: Never    Marital Status: Widowed  Intimate Partner Violence: Not At Risk (10/24/2023)   Humiliation, Afraid, Rape, and Kick questionnaire    Fear of Current or Ex-Partner: No    Emotionally Abused: No    Physically Abused: No    Sexually Abused: No    Family History  Problem Relation Age of Onset   Cancer Brother    Cancer Other     Allergies  Allergen Reactions   Albuterol  Other (See Comments)    Pt stated after she used the albuterol  nebulizer solution on 10-14-2023 she had a very bad headache that didn't go away for 4 days even with taking Tylenol  to try to help   Codeine Nausea And Vomiting    Headache    Entresto [Sacubitril-Valsartan] Other (See Comments)    Unknown    Jardiance  [Empagliflozin ] Other (See Comments)    Unknown    Meclizine Other (See Comments)    Made dizziness worse.   Sulfa Antibiotics Nausea And Vomiting    Stomach upset   Zoloft [Sertraline Hcl] Anxiety    Outpatient Medications Prior to Visit  Medication Sig   acetaminophen  (TYLENOL ) 500 MG tablet Take 1,000 mg by mouth every 6 (six) hours as needed for moderate pain or headache.   albuterol  (PROVENTIL  HFA;VENTOLIN  HFA) 108 (90 BASE) MCG/ACT inhaler Inhale 2 puffs into the lungs every 6 (six) hours as needed for shortness of breath. (Patient not taking: Reported on 12/07/2023)    atorvastatin  (LIPITOR) 20 MG tablet TAKE ONE TABLET BY MOUTH ONCE DAILY.   bisoprolol  (ZEBETA ) 5 MG tablet TAKE (1/2) TABLET BY MOUTH ONCE DAILY. (Patient taking differently: Take 2.5 mg by mouth daily.)   BLACK COHOSH PO Take 1 capsule by mouth daily.   cholecalciferol  (VITAMIN D3) 25 MCG (1000 UNIT) tablet Take 1,000-2,000 Units by mouth daily. Alternating between 1-2 tabs every other day (  Monday - 2 tabs, Tuesday - 1 tab, Wednesday - 2 tabs, Thursday - 1 tab, Friday - 2 tabs) On Saturday and Sunday it's 1 tab daily   diphenhydrAMINE  (BENADRYL ) 25 MG tablet Take 25 mg by mouth daily as needed for allergies.   ELIQUIS  5 MG TABS tablet TAKE 1 TABLET BY MOUTH TWICE A DAY   ferrous sulfate  325 (65 FE) MG EC tablet Take 325 mg by mouth 3 (three) times daily with meals. (Patient taking differently: Take 325 mg by mouth daily.)   furosemide  (LASIX ) 20 MG tablet Take 1 tablet (20 mg total) by mouth daily as needed for fluid or edema (shortness of breath).   losartan  (COZAAR ) 25 MG tablet TAKE (1/2) TABLET BY MOUTH DAILY (Patient taking differently: Take 12.5 mg by mouth daily. TAKE (1/2) TABLET BY MOUTH DAILY)   Misc. 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 Dx: C50.911   omeprazole  (PRILOSEC) 20 MG capsule Take 1 capsule (20 mg total) by mouth daily.   ondansetron  (ZOFRAN ) 4 MG tablet Take 1 tablet (4 mg total) by mouth every 8 (eight) hours as needed for nausea or vomiting. (Patient taking differently: Take 4 mg by mouth 3 (three) times daily as needed for nausea or vomiting.)   spironolactone  (ALDACTONE ) 25 MG tablet Take 0.5 tablets (12.5 mg total) by mouth daily.   VEOZAH  45 MG TABS Take 1 tablet (45 mg total) by mouth daily.   No facility-administered medications prior to visit.    ROS     Objective:   BP 110/60   Pulse 70   Ht 5' 1 (1.549 m)   Wt 145 lb (65.8 kg)   SpO2 98%   BMI 27.40 kg/m   Vitals:   01/09/24 1050  BP: 110/60  Pulse: 70   Height: 5' 1 (1.549 m)  Weight: 145 lb (65.8 kg)  SpO2: 98%  BMI (Calculated): 27.41    Physical Exam Vitals and nursing note reviewed.  Constitutional:      Appearance: Normal appearance.  HENT:     Head: Normocephalic.     Nose: Nose normal.     Mouth/Throat:     Mouth: Mucous membranes are moist.  Cardiovascular:     Rate and Rhythm: Normal rate and regular rhythm.     Pulses: Normal pulses.     Heart sounds: Normal heart sounds.  Pulmonary:     Effort: Pulmonary effort is normal.     Breath sounds: Normal breath sounds.  Musculoskeletal:        General: Normal range of motion.     Cervical back: Normal range of motion and neck supple.  Skin:    General: Skin is warm and dry.  Neurological:     Mental Status: She is alert and oriented to person, place, and time.  Psychiatric:        Mood and Affect: Mood normal.        Behavior: Behavior normal.      No results found for any visits on 01/09/24.  Recent Results (from the past 2160 hours)  Comprehensive metabolic panel     Status: Abnormal   Collection Time: 10/21/23  7:01 PM  Result Value Ref Range   Sodium 142 135 - 145 mmol/L   Potassium 3.6 3.5 - 5.1 mmol/L   Chloride 109 98 - 111 mmol/L   CO2 22 22 - 32 mmol/L   Glucose, Bld 108 (H) 70 - 99 mg/dL    Comment: Glucose  reference range applies only to samples taken after fasting for at least 8 hours.   BUN 20 8 - 23 mg/dL   Creatinine, Ser 9.29 0.44 - 1.00 mg/dL   Calcium  9.3 8.9 - 10.3 mg/dL   Total Protein 5.9 (L) 6.5 - 8.1 g/dL   Albumin 3.6 3.5 - 5.0 g/dL   AST 19 15 - 41 U/L   ALT 10 0 - 44 U/L   Alkaline Phosphatase 44 38 - 126 U/L   Total Bilirubin 0.9 0.0 - 1.2 mg/dL   GFR, Estimated >39 >39 mL/min    Comment: (NOTE) Calculated using the CKD-EPI Creatinine Equation (2021)    Anion gap 11 5 - 15    Comment: Performed at [facility], [address], [place], [place] [address]  Brain natriuretic peptide     Status: Abnormal   Collection Time:  10/21/23  7:01 PM  Result Value Ref Range   B Natriuretic Peptide 755.0 (H) 0.0 - 100.0 pg/mL    Comment: Performed at [facility], [address] [place], [place] [address]  Troponin I (High Sensitivity)     Status: None   Collection Time: 10/21/23  7:01 PM  Result Value Ref Range   Troponin I (High Sensitivity) 6 <18 ng/L    Comment: (NOTE) Elevated high sensitivity troponin I (hsTnI) values and significant  changes across serial measurements may suggest ACS but many other  chronic and acute conditions are known to elevate hsTnI results.  Refer to the Links section for chest pain algorithms and additional  guidance. Performed at [facility], [address], [place], [place] [address]   CBC with Differential     Status: Abnormal   Collection Time: 10/21/23  7:01 PM  Result Value Ref Range   WBC 12.1 (H) 4.0 - 10.5 K/uL   RBC 2.89 (L) 3.87 - 5.11 MIL/uL   Hemoglobin 9.3 (L) 12.0 - 15.0 g/dL   HCT 71.7 (L) 63.9 - 53.9 %   MCV 97.6 80.0 - 100.0 fL   MCH 32.2 26.0 - 34.0 pg   MCHC 33.0 30.0 - 36.0 g/dL   RDW 84.1 (H) 88.4 - 84.4 %   Platelets 186 150 - 400 K/uL   nRBC 0.0 0.0 - 0.2 %   Neutrophils Relative % 35 %   Neutro Abs 4.1 1.7 - 7.7 K/uL   Lymphocytes Relative 59 %   Lymphs Abs 6.9 (H) 0.7 - 4.0 K/uL   Monocytes Relative 5 %   Monocytes Absolute 0.6 0.1 - 1.0 K/uL   Eosinophils Relative 0 %   Eosinophils Absolute 0.1 0.0 - 0.5 K/uL   Basophils Relative 1 %   Basophils Absolute 0.1 0.0 - 0.1 K/uL   Immature Granulocytes 0 %   Abs Immature Granulocytes 0.02 0.00 - 0.07 K/uL    Comment: Performed at [facility], [address] [place], [place] [address]  Troponin I (High Sensitivity)     Status: None   Collection Time: 10/21/23  9:17 PM  Result Value Ref Range   Troponin I (High Sensitivity) 6 <18 ng/L    Comment: (NOTE) Elevated high sensitivity troponin I (hsTnI) values and significant  changes across serial measurements may suggest ACS but many other  chronic  and acute conditions are known to elevate hsTnI results.  Refer to the Links section for chest pain algorithms and additional  guidance. Performed at [facility], [address], [place], [place] [address]   Comprehensive metabolic panel     Status:  Abnormal   Collection Time: 10/22/23  3:39 AM  Result Value Ref Range   Sodium 141 135 - 145 mmol/L   Potassium 3.1 (L) 3.5 - 5.1 mmol/L   Chloride 106 98 - 111 mmol/L   CO2 24 22 - 32 mmol/L   Glucose, Bld 111 (H) 70 - 99 mg/dL    Comment: Glucose reference range applies only to samples taken after fasting for at least 8 hours.   BUN 18 8 - 23 mg/dL   Creatinine, Ser 9.24 0.44 - 1.00 mg/dL   Calcium  9.1 8.9 - 10.3 mg/dL   Total Protein 5.9 (L) 6.5 - 8.1 g/dL   Albumin 3.8 3.5 - 5.0 g/dL   AST 21 15 - 41 U/L   ALT 12 0 - 44 U/L   Alkaline Phosphatase 45 38 - 126 U/L   Total Bilirubin 0.9 0.0 - 1.2 mg/dL   GFR, Estimated >39 >39 mL/min    Comment: (NOTE) Calculated using the CKD-EPI Creatinine Equation (2021)    Anion gap 11 5 - 15    Comment: Performed at [facility], [address], [place], [place] [address]  CBC     Status: Abnormal   Collection Time: 10/22/23  3:39 AM  Result Value Ref Range   WBC 17.1 (H) 4.0 - 10.5 K/uL   RBC 3.16 (L) 3.87 - 5.11 MIL/uL   Hemoglobin 10.2 (L) 12.0 - 15.0 g/dL   HCT 69.1 (L) 63.9 - 53.9 %   MCV 97.5 80.0 - 100.0 fL   MCH 32.3 26.0 - 34.0 pg   MCHC 33.1 30.0 - 36.0 g/dL   RDW 84.2 (H) 88.4 - 84.4 %   Platelets 235 150 - 400 K/uL   nRBC 0.0 0.0 - 0.2 %    Comment: Performed at [facility], [address], [place], [place] [address]  Magnesium      Status: None   Collection Time: 10/22/23  3:39 AM  Result Value Ref Range   Magnesium  2.0 1.7 - 2.4 mg/dL    Comment: Performed at [facility], [address], [place], [place] [address]  Phosphorus     Status: None   Collection Time: 10/22/23  3:39 AM  Result Value Ref Range   Phosphorus 3.7 2.5 - 4.6 mg/dL    Comment: Performed at [facility], [address], [place], [place] [address]  ECHOCARDIOGRAM COMPLETE     Status: None   Collection Time: 10/22/23  9:49 AM  Result Value Ref Range   Weight 2,398.6 oz   Height 61 in   BP 92/57 mmHg   Area-P 1/2 2.24 cm2   S' Lateral 3.50 cm   MV M vel 5.08 m/s   MV Peak grad 103.2 mmHg   Radius 0.40 cm   AR max vel 1.91 cm2   AV Area mean vel 1.82 cm2   AV Area VTI 1.93 cm2   Est EF 50 - 55%    AV Peak grad 15.1 mmHg   Ao pk vel 1.94 m/s   AV Mean grad 8.2 mmHg  CBC     Status: Abnormal   Collection Time: 10/23/23  5:49 AM  Result Value Ref Range   WBC 14.2 (H) 4.0 - 10.5 K/uL   RBC 3.11 (L) 3.87 - 5.11 MIL/uL   Hemoglobin 9.5 (L) 12.0 - 15.0 g/dL   HCT 70.1 (L) 63.9 - 53.9 %   MCV 95.8 80.0 - 100.0 fL   MCH 30.5 26.0 - 34.0 pg  MCHC 31.9 30.0 - 36.0 g/dL   RDW 84.1 (H) 88.4 - 84.4 %   Platelets 235 150 - 400 K/uL   nRBC 0.0 0.0 - 0.2 %    Comment: Performed at [facility], [address] [place], [place] [address]  Basic metabolic panel with GFR     Status: Abnormal   Collection Time: 10/23/23  5:49 AM  Result Value Ref Range   Sodium 141 135 - 145 mmol/L   Potassium 4.3 3.5 - 5.1 mmol/L   Chloride 107 98 - 111 mmol/L   CO2 24 22 - 32 mmol/L   Glucose, Bld 115 (H) 70 - 99 mg/dL    Comment: Glucose reference range applies only to samples taken after fasting for at least 8 hours.   BUN 24 (H) 8 - 23 mg/dL   Creatinine, Ser 9.00 0.44 - 1.00 mg/dL   Calcium  9.0 8.9 - 10.3 mg/dL   GFR, Estimated 56 (L) >60 mL/min    Comment: (NOTE) Calculated using the CKD-EPI Creatinine Equation (2021)    Anion gap 10 5 - 15    Comment: Performed at [facility], [address], [place], [place] [address]  Magnesium      Status: None   Collection Time: 10/23/23  5:49 AM  Result Value Ref Range   Magnesium  1.9 1.7 - 2.4 mg/dL    Comment: Performed at [facility], [address], [place], [place] [address]  BMP8+eGFR     Status: Abnormal   Collection Time: 11/03/23 11:54 AM   Result Value Ref Range   Glucose 100 (H) 70 - 99 mg/dL   BUN 38 (H) 8 - 27 mg/dL   Creatinine, Ser 8.64 (H) 0.57 - 1.00 mg/dL   eGFR 38 (L) >40 fO/fpw/8.26   BUN/Creatinine Ratio 28 12 - 28   Sodium 141 134 - 144 mmol/L   Potassium 4.0 3.5 - 5.2 mmol/L   Chloride 102 96 - 106 mmol/L   CO2 20 20 - 29 mmol/L   Calcium  9.2 8.7 - 10.3 mg/dL  Iron and TIBC ([facility] ONLY)     Status: Abnormal   Collection Time: 11/09/23  2:11 PM  Result Value Ref Range   Iron 307 (H) 28 - 170 ug/dL   TIBC 623 749 - 549 ug/dL   Saturation Ratios 82 (H) 10.4 - 31.8 %   UIBC 69 ug/dL    Comment: Performed at [facility], [address], [place], [place] [address]  Ferritin     Status: None   Collection Time: 11/09/23  2:11 PM  Result Value Ref Range   Ferritin 54 11 - 307 ng/mL    Comment: Performed at [facility], [address], [place], [place] [address]  Vitamin D  25 hydroxy     Status: None   Collection Time: 11/09/23  2:12 PM  Result Value Ref Range   Vit D, 25-Hydroxy 32.25 30 - 100 ng/mL    Comment: (NOTE) Vitamin D  deficiency has been defined by the Institute of Medicine  and an Endocrine Society practice guideline as a level of serum 25-OH  vitamin D  less than 20 ng/mL (1,2). The Endocrine Society went on to  further define vitamin D  insufficiency as a level between 21 and 29  ng/mL (2).  1. IOM (Institute of Medicine). 2010. Dietary reference intakes for  calcium  and D. [place]: [employer]. 2. Holick MF, Binkley Carrington, Bischoff-Ferrari HA, et al. Evaluation,  treatment, and prevention of vitamin D  deficiency: an  Endocrine  Society clinical practice guideline, JCEM. 2011 Jul; 96(7): 1911-30.  Performed at [facility], [address] [place], [place] [address]   Lactate dehydrogenase     Status: None   Collection Time: 11/09/23  2:12 PM  Result Value Ref Range   LDH 136 98 - 192 U/L    Comment: Performed at [facility], [address] [place], [place] [address]  Comprehensive metabolic panel     Status: Abnormal   Collection Time: 11/09/23  2:12 PM  Result Value Ref Range   Sodium 138 135 - 145 mmol/L   Potassium 4.3 3.5 - 5.1 mmol/L   Chloride 106 98 - 111 mmol/L   CO2 19 (L) 22 - 32 mmol/L   Glucose, Bld 143 (H) 70 - 99 mg/dL    Comment: Glucose reference range applies only to samples taken after fasting for at least 8 hours.   BUN 28 (H) 8 - 23 mg/dL   Creatinine, Ser 8.66 (H) 0.44 - 1.00 mg/dL   Calcium  9.0 8.9 - 10.3 mg/dL   Total Protein 6.1 (L) 6.5 - 8.1 g/dL   Albumin 3.9 3.5 - 5.0 g/dL   AST 23 15 - 41 U/L   ALT 12 0 - 44 U/L   Alkaline Phosphatase 46 38 - 126 U/L   Total Bilirubin 0.8 0.0 - 1.2 mg/dL   GFR, Estimated 39 (L) >60 mL/min    Comment: (NOTE) Calculated using the CKD-EPI Creatinine Equation (2021)    Anion gap 13 5 - 15    Comment: Performed at [facility], [address] [place], [place] [address]  CBC with Differential     Status: Abnormal   Collection Time: 11/09/23  2:12 PM  Result Value Ref Range   WBC 21.5 (H) 4.0 - 10.5 K/uL   RBC 3.02 (L) 3.87 - 5.11 MIL/uL   Hemoglobin 9.4 (L) 12.0 - 15.0 g/dL   HCT 69.3 (L) 63.9 - 53.9 %   MCV 101.3 (H) 80.0 - 100.0 fL   MCH 31.1 26.0 - 34.0 pg   MCHC 30.7 30.0 - 36.0 g/dL   RDW 85.1 88.4 - 84.4 %   Platelets 213 150 - 400 K/uL   nRBC 0.0 0.0 - 0.2 %   Neutrophils Relative % 21 %   Neutro Abs 4.5 1.7 - 7.7 K/uL   Lymphocytes Relative 71 %   Lymphs Abs 15.3 (H) 0.7 - 4.0 K/uL   Monocytes Relative 5 %   Monocytes Absolute 1.1 (H) 0.1 - 1.0 K/uL   Eosinophils Relative 3 %   Eosinophils Absolute 0.6 (H) 0.0 - 0.5 K/uL   Basophils Relative 0 %   Basophils Absolute 0.0 0.0 - 0.1 K/uL   WBC Morphology      Lymphocytosis with morphology consistent with known diagnosis of CLL.   RBC Morphology MORPHOLOGY UNREMARKABLE    Smear Review MORPHOLOGY UNREMARKABLE    Abs Immature Granulocytes 0.00 0.00 - 0.07 K/uL    Comment: Performed at  [facility], [address] [place], [place] [address]      Assessment & Plan:  1) Menopause - refill veozah  2) Nausea - ondansetron  refill 3) Follow up with Leita in 3 months, check liver enzymes   Problem List Items Addressed This Visit   None   No follow-ups on file.   Total time spent: 20 minutes  Neale Carpen, NP  01/09/2024   This document may have been prepared by [facility] Voice Recognition software and as  such may include unintentional dictation errors.

## 2024-01-10 ENCOUNTER — Ambulatory Visit: Payer: Self-pay

## 2024-01-10 LAB — CMP14+EGFR
ALT: 9 IU/L (ref 0–32)
AST: 19 IU/L (ref 0–40)
Albumin: 4.3 g/dL (ref 3.7–4.7)
Alkaline Phosphatase: 56 IU/L (ref 44–121)
BUN/Creatinine Ratio: 29 — ABNORMAL HIGH (ref 12–28)
BUN: 25 mg/dL (ref 8–27)
Bilirubin Total: 0.4 mg/dL (ref 0.0–1.2)
CO2: 21 mmol/L (ref 20–29)
Calcium: 9.4 mg/dL (ref 8.7–10.3)
Chloride: 105 mmol/L (ref 96–106)
Creatinine, Ser: 0.87 mg/dL (ref 0.57–1.00)
Globulin, Total: 1.7 g/dL (ref 1.5–4.5)
Glucose: 109 mg/dL — ABNORMAL HIGH (ref 70–99)
Potassium: 3.7 mmol/L (ref 3.5–5.2)
Sodium: 144 mmol/L (ref 134–144)
Total Protein: 6 g/dL (ref 6.0–8.5)
eGFR: 65 mL/min/1.73 (ref >59)

## 2024-01-26 ENCOUNTER — Institutional Professional Consult (permissible substitution) (INDEPENDENT_AMBULATORY_CARE_PROVIDER_SITE_OTHER): Admitting: Otolaryngology

## 2024-02-15 ENCOUNTER — Inpatient Hospital Stay: Attending: Hematology

## 2024-02-15 DIAGNOSIS — C9110 Chronic lymphocytic leukemia of B-cell type not having achieved remission: Secondary | ICD-10-CM | POA: Diagnosis present

## 2024-02-15 DIAGNOSIS — D649 Anemia, unspecified: Secondary | ICD-10-CM

## 2024-02-15 DIAGNOSIS — C50911 Malignant neoplasm of unspecified site of right female breast: Secondary | ICD-10-CM

## 2024-02-15 LAB — COMPREHENSIVE METABOLIC PANEL WITH GFR
ALT: 10 U/L (ref 0–44)
AST: 19 U/L (ref 15–41)
Albumin: 3.7 g/dL (ref 3.5–5.0)
Alkaline Phosphatase: 41 U/L (ref 38–126)
Anion gap: 10 (ref 5–15)
BUN: 25 mg/dL — ABNORMAL HIGH (ref 8–23)
CO2: 22 mmol/L (ref 22–32)
Calcium: 8.6 mg/dL — ABNORMAL LOW (ref 8.9–10.3)
Chloride: 109 mmol/L (ref 98–111)
Creatinine, Ser: 0.99 mg/dL (ref 0.44–1.00)
GFR, Estimated: 56 mL/min — ABNORMAL LOW (ref >60)
Glucose, Bld: 103 mg/dL — ABNORMAL HIGH (ref 70–99)
Potassium: 3.9 mmol/L (ref 3.5–5.1)
Sodium: 141 mmol/L (ref 135–145)
Total Bilirubin: 0.4 mg/dL (ref 0.0–1.2)
Total Protein: 6.1 g/dL — ABNORMAL LOW (ref 6.5–8.1)

## 2024-02-15 LAB — CBC WITH DIFFERENTIAL/PLATELET
Abs Immature Granulocytes: 0.02 K/uL (ref 0.00–0.07)
Basophils Absolute: 0.1 K/uL (ref 0.0–0.1)
Basophils Relative: 1 %
Eosinophils Absolute: 0.2 K/uL (ref 0.0–0.5)
Eosinophils Relative: 1 %
HCT: 30.9 % — ABNORMAL LOW (ref 36.0–46.0)
Hemoglobin: 9.7 g/dL — ABNORMAL LOW (ref 12.0–15.0)
Immature Granulocytes: 0 %
Lymphocytes Relative: 72 %
Lymphs Abs: 13.5 K/uL — ABNORMAL HIGH (ref 0.7–4.0)
MCH: 29.7 pg (ref 26.0–34.0)
MCHC: 31.4 g/dL (ref 30.0–36.0)
MCV: 94.5 fL (ref 80.0–100.0)
Monocytes Absolute: 1.6 K/uL — ABNORMAL HIGH (ref 0.1–1.0)
Monocytes Relative: 9 %
Neutro Abs: 3.1 K/uL (ref 1.7–7.7)
Neutrophils Relative %: 17 %
Platelets: 211 K/uL (ref 150–400)
RBC: 3.27 MIL/uL — ABNORMAL LOW (ref 3.87–5.11)
RDW: 13.4 % (ref 11.5–15.5)
Smear Review: NORMAL
WBC: 18.5 K/uL — ABNORMAL HIGH (ref 4.0–10.5)
nRBC: 0 % (ref 0.0–0.2)

## 2024-02-15 LAB — VITAMIN B12: Vitamin B-12: 247 pg/mL (ref 180–914)

## 2024-02-15 LAB — FERRITIN: Ferritin: 8 ng/mL — ABNORMAL LOW (ref 11–307)

## 2024-02-16 ENCOUNTER — Telehealth (INDEPENDENT_AMBULATORY_CARE_PROVIDER_SITE_OTHER): Payer: Self-pay

## 2024-02-16 LAB — COPPER, SERUM: Copper: 109 ug/dL (ref 80–158)

## 2024-02-16 NOTE — Telephone Encounter (Signed)
 Pt canceled her appointment with teoh for this month but wants to be put back on his schedule as soon as possible. She is in a lot of pain in her right ear she has been taking tylenol  every 4 hours and it is not helping her at all.

## 2024-02-19 ENCOUNTER — Telehealth: Payer: Self-pay

## 2024-02-19 NOTE — Telephone Encounter (Signed)
 Copied from CRM #[id number]. Topic: General - Deceased Patient >> 02-24-2024 11:07 AM [name] wrote: Name of caller: [name]  Date of death: [id number]   Name of funeral home: [facility]  Phone number of funeral home: [phone]  Provider that needs to sign form: Np Leita Longs or Dr Suzzane Blanch Lessie states NP Huenink is not in the [place] system.)  Timeline for signing: ASAP for cremation

## 2024-02-20 ENCOUNTER — Ambulatory Visit: Payer: Self-pay | Admitting: Oncology

## 2024-02-20 LAB — METHYLMALONIC ACID, SERUM: Methylmalonic Acid, Quantitative: 311 nmol/L (ref 0–378)

## 2024-02-20 NOTE — Progress Notes (Signed)
 Hey ladies, she needs iron infusions if agreeable.  It does not look like she has ever had them before but her iron levels are dropping despite taking iron supplements.  I would recommend 1 g INFeD if insurance will approve.  Let me know what she says and then we can get her scheduled.  I do not see her back until December.  Delon Hope, NP 02/20/2024 12:55 PM

## 2024-02-21 NOTE — Progress Notes (Signed)
 TY   Delon Hope, NP 02/21/2024 9:55 AM

## 2024-02-21 DEATH — deceased

## 2024-02-27 NOTE — Telephone Encounter (Signed)
 Dr Tobie has signed off on the death certificate

## 2024-04-16 ENCOUNTER — Ambulatory Visit

## 2024-05-14 ENCOUNTER — Inpatient Hospital Stay

## 2024-05-22 ENCOUNTER — Inpatient Hospital Stay: Admitting: Oncology

## 2024-06-19 ENCOUNTER — Ambulatory Visit (HOSPITAL_COMMUNITY)
